# Patient Record
Sex: Female | Born: 1950 | Race: Black or African American | Hispanic: No | Marital: Married | Smoking: Former smoker
Health system: Southern US, Community
[De-identification: ages and names within clinical notes are randomized; demographics above are authoritative.]

## PROBLEM LIST (undated history)

## (undated) DIAGNOSIS — I10 Essential (primary) hypertension: Secondary | ICD-10-CM

## (undated) DIAGNOSIS — Z9889 Other specified postprocedural states: Secondary | ICD-10-CM

## (undated) DIAGNOSIS — R112 Nausea with vomiting, unspecified: Secondary | ICD-10-CM

## (undated) DIAGNOSIS — J8410 Pulmonary fibrosis, unspecified: Secondary | ICD-10-CM

## (undated) DIAGNOSIS — G43909 Migraine, unspecified, not intractable, without status migrainosus: Secondary | ICD-10-CM

## (undated) DIAGNOSIS — E119 Type 2 diabetes mellitus without complications: Secondary | ICD-10-CM

## (undated) DIAGNOSIS — J309 Allergic rhinitis, unspecified: Secondary | ICD-10-CM

## (undated) HISTORY — DX: Essential (primary) hypertension: I10

## (undated) HISTORY — DX: Type 2 diabetes mellitus without complications: E11.9

## (undated) HISTORY — DX: Migraine, unspecified, not intractable, without status migrainosus: G43.909

## (undated) HISTORY — DX: Allergic rhinitis, unspecified: J30.9

## (undated) HISTORY — PX: SALPINGOOPHORECTOMY: SHX82

## (undated) HISTORY — PX: ABDOMINAL HYSTERECTOMY: SHX81

## (undated) HISTORY — PX: BREAST SURGERY: SHX581

---

## 1987-01-28 HISTORY — PX: ABDOMINAL HYSTERECTOMY: SHX81

## 1998-08-30 ENCOUNTER — Other Ambulatory Visit: Admission: RE | Admit: 1998-08-30 | Discharge: 1998-08-30 | Payer: Self-pay | Admitting: *Deleted

## 1999-01-17 ENCOUNTER — Ambulatory Visit (HOSPITAL_BASED_OUTPATIENT_CLINIC_OR_DEPARTMENT_OTHER): Admission: RE | Admit: 1999-01-17 | Discharge: 1999-01-17 | Payer: Self-pay | Admitting: Surgery

## 1999-02-04 ENCOUNTER — Encounter: Payer: Self-pay | Admitting: Gastroenterology

## 1999-02-04 ENCOUNTER — Encounter: Admission: RE | Admit: 1999-02-04 | Discharge: 1999-02-04 | Payer: Self-pay | Admitting: Gastroenterology

## 1999-09-03 ENCOUNTER — Other Ambulatory Visit: Admission: RE | Admit: 1999-09-03 | Discharge: 1999-09-03 | Payer: Self-pay | Admitting: *Deleted

## 2000-10-15 ENCOUNTER — Other Ambulatory Visit: Admission: RE | Admit: 2000-10-15 | Discharge: 2000-10-15 | Payer: Self-pay | Admitting: *Deleted

## 2001-11-05 ENCOUNTER — Ambulatory Visit (HOSPITAL_COMMUNITY): Admission: RE | Admit: 2001-11-05 | Discharge: 2001-11-05 | Payer: Self-pay | Admitting: Gastroenterology

## 2001-11-16 ENCOUNTER — Other Ambulatory Visit: Admission: RE | Admit: 2001-11-16 | Discharge: 2001-11-16 | Payer: Self-pay | Admitting: *Deleted

## 2002-04-25 ENCOUNTER — Encounter: Payer: Self-pay | Admitting: Gastroenterology

## 2002-04-25 ENCOUNTER — Encounter: Admission: RE | Admit: 2002-04-25 | Discharge: 2002-04-25 | Payer: Self-pay | Admitting: Gastroenterology

## 2002-07-30 ENCOUNTER — Emergency Department (HOSPITAL_COMMUNITY): Admission: EM | Admit: 2002-07-30 | Discharge: 2002-07-30 | Payer: Self-pay | Admitting: Emergency Medicine

## 2002-11-29 ENCOUNTER — Other Ambulatory Visit: Admission: RE | Admit: 2002-11-29 | Discharge: 2002-11-29 | Payer: Self-pay | Admitting: *Deleted

## 2003-10-11 ENCOUNTER — Encounter: Admission: RE | Admit: 2003-10-11 | Discharge: 2003-10-11 | Payer: Self-pay | Admitting: Gastroenterology

## 2003-12-04 ENCOUNTER — Other Ambulatory Visit: Admission: RE | Admit: 2003-12-04 | Discharge: 2003-12-04 | Payer: Self-pay | Admitting: *Deleted

## 2004-12-09 ENCOUNTER — Other Ambulatory Visit: Admission: RE | Admit: 2004-12-09 | Discharge: 2004-12-09 | Payer: Self-pay | Admitting: *Deleted

## 2006-02-02 ENCOUNTER — Other Ambulatory Visit: Admission: RE | Admit: 2006-02-02 | Discharge: 2006-02-02 | Payer: Self-pay | Admitting: *Deleted

## 2007-04-06 ENCOUNTER — Other Ambulatory Visit: Admission: RE | Admit: 2007-04-06 | Discharge: 2007-04-06 | Payer: Self-pay | Admitting: *Deleted

## 2008-03-27 ENCOUNTER — Encounter (INDEPENDENT_AMBULATORY_CARE_PROVIDER_SITE_OTHER): Payer: Self-pay | Admitting: *Deleted

## 2008-03-27 LAB — CONVERTED CEMR LAB
RBC count: 3.29 10*6/uL
TSH: 1.95 microintl units/mL
WBC, blood: 8 10*3/uL

## 2008-07-05 ENCOUNTER — Encounter: Admission: RE | Admit: 2008-07-05 | Discharge: 2008-07-05 | Payer: Self-pay | Admitting: Gastroenterology

## 2008-07-05 IMAGING — CT CT CHEST W/O CM
3 of 4 series · 16 of 30 positions shown, 18 images · non-contrast
Comparison: None

CLINICAL DATA: Bronchitis.  Cough.

CT CHEST WITHOUT CONTRAST
TECHNIQUE: Multidetector CT imaging of the chest was performed
following the standard protocol without IV contrast.

[Series 3: routine chest · axial · 0.70mm/px · z∈[-258,-38]mm · 5 of 66 slices shown, 7 images]
[im 11/66  mediastinal]
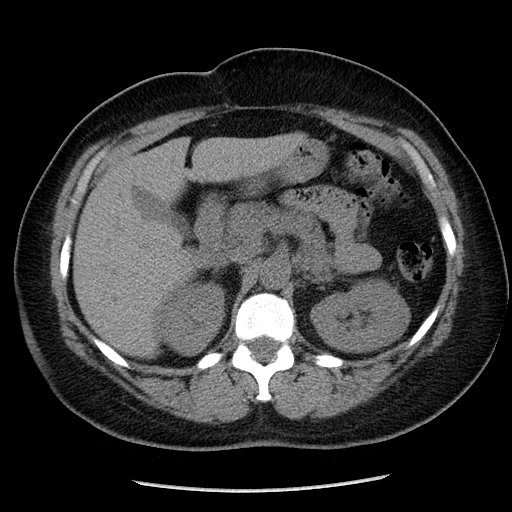
[im 11/66  lung]
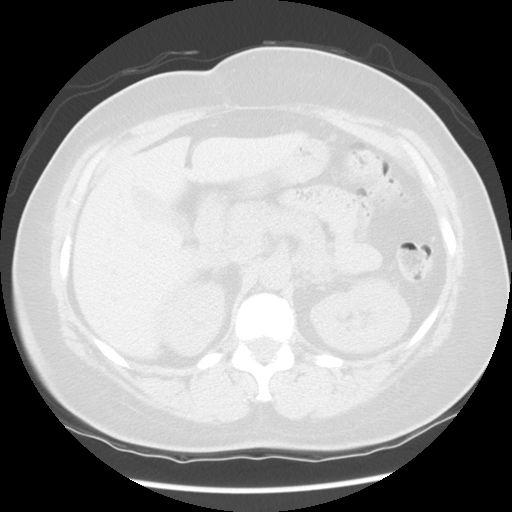
[im 22/66  lung]
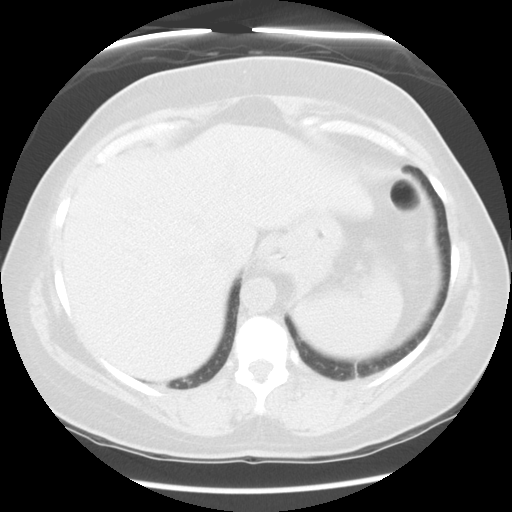
[im 33/66  lung]
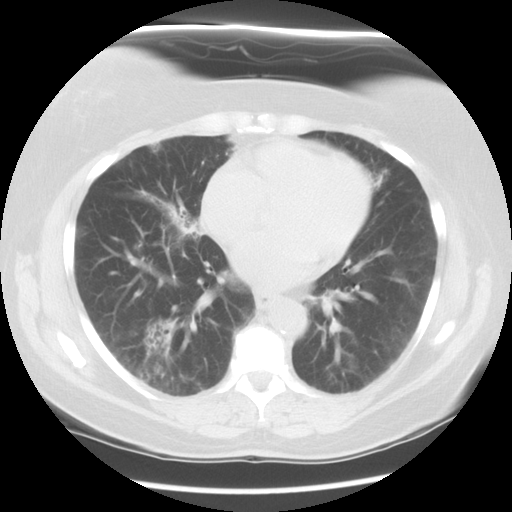
[im 44/66  lung]
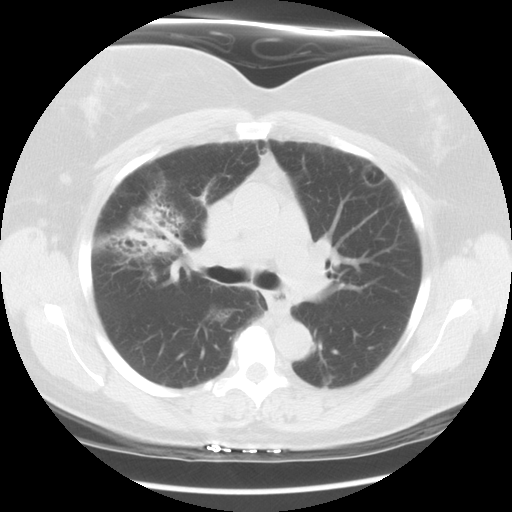
[im 55/66  mediastinal]
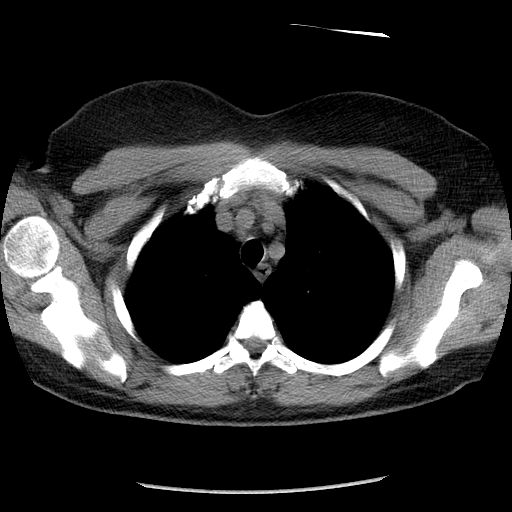
[im 55/66  lung]
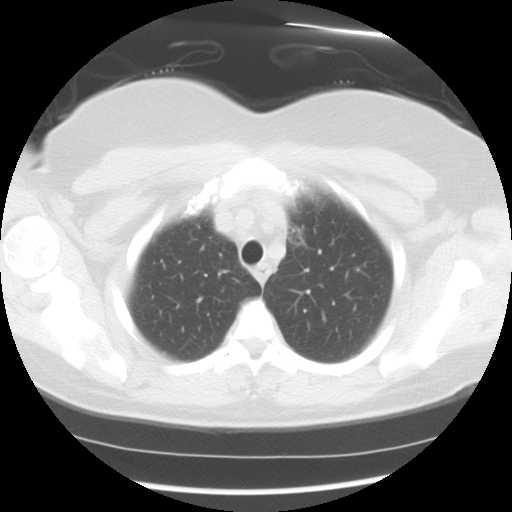

[Series 4: lung windows · axial · 0.70mm/px · z∈[-212,-42]mm · 4 of 58 slices shown]
[im 12/58  lung]
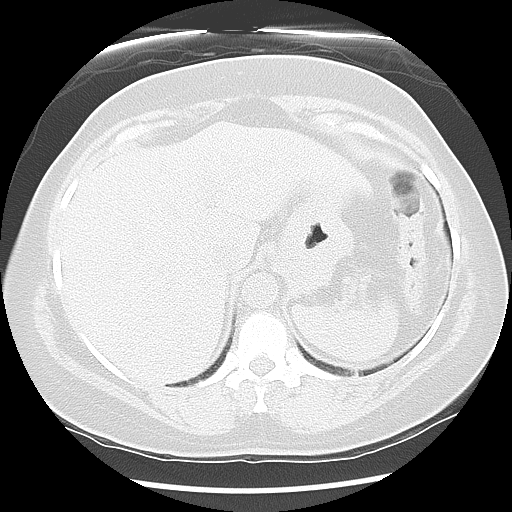
[im 23/58  lung]
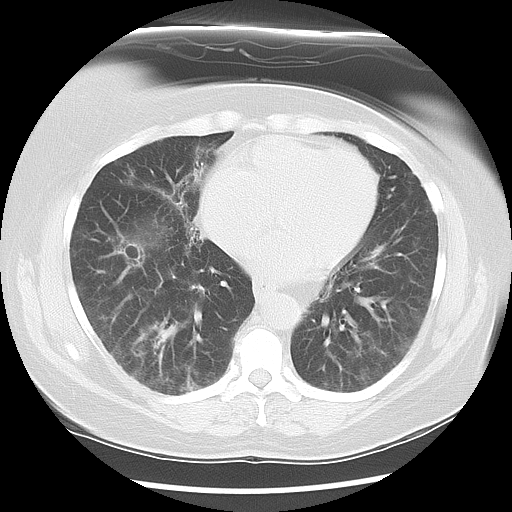
[im 35/58  lung]
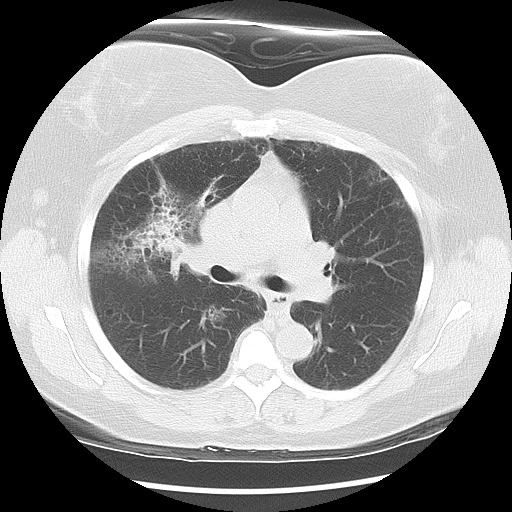
[im 46/58  lung]
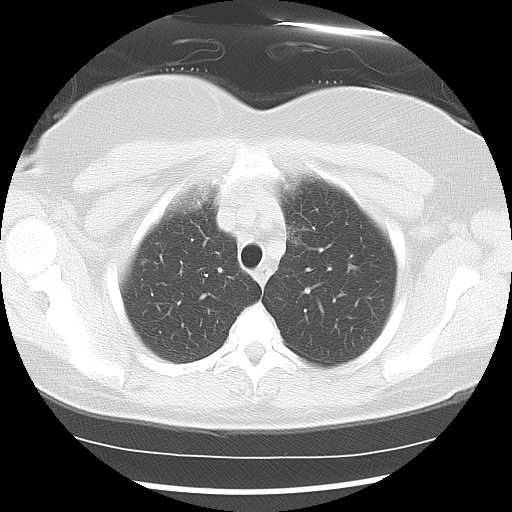

[Series 602: sagittal body · sagittal · 0.70mm/px · 7 of 145 slices shown]
[im 11/145  mediastinal]
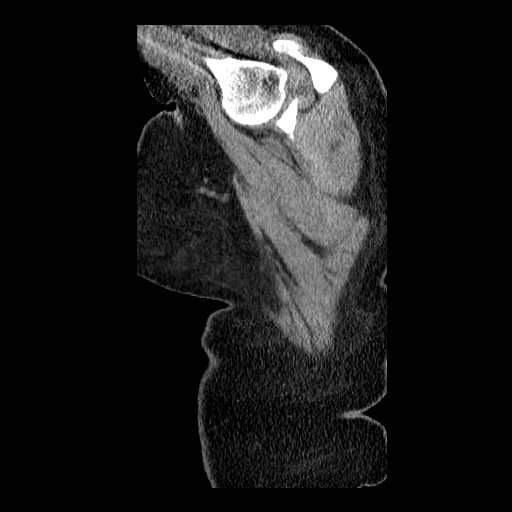
[im 31/145  mediastinal]
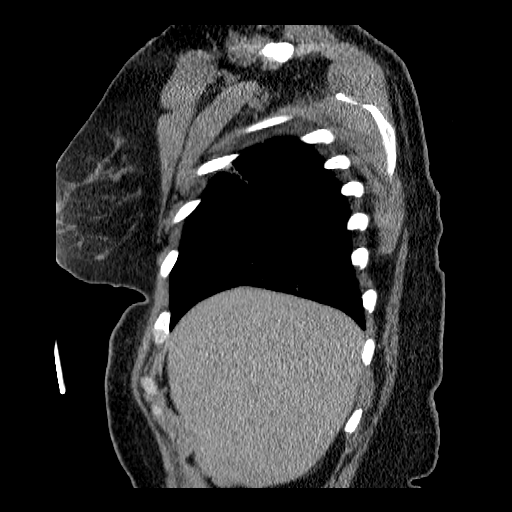
[im 52/145  mediastinal]
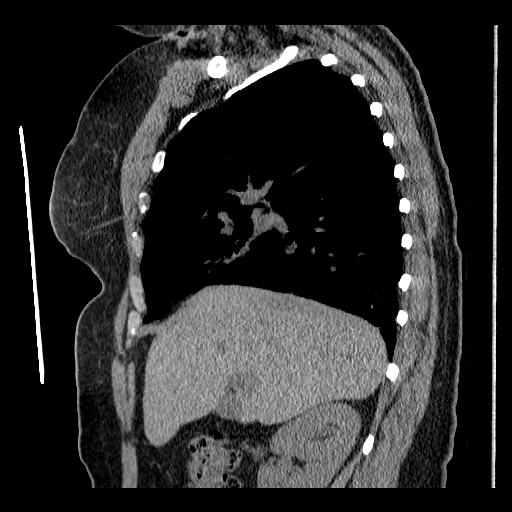
[im 62/145  mediastinal]
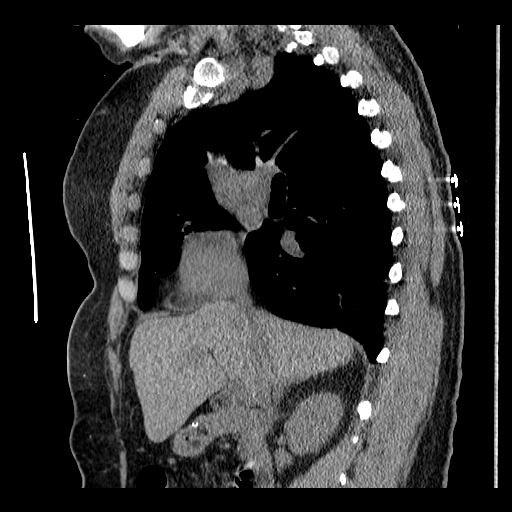
[im 83/145  mediastinal]
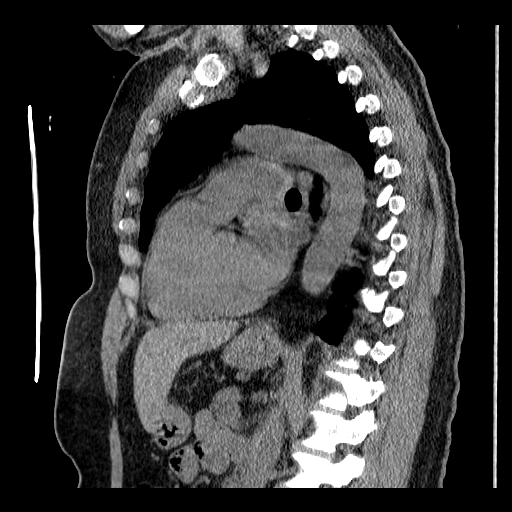
[im 93/145  mediastinal]
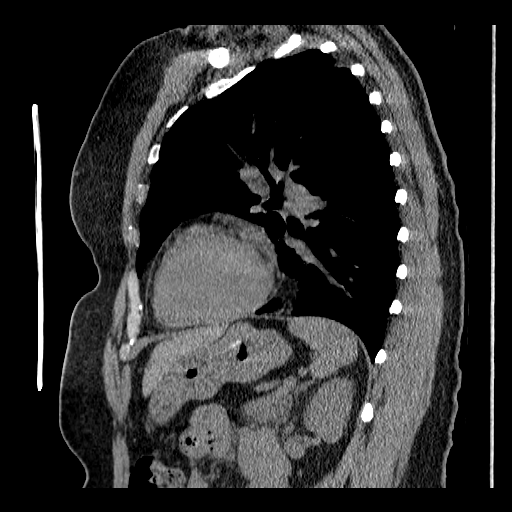
[im 114/145  mediastinal]
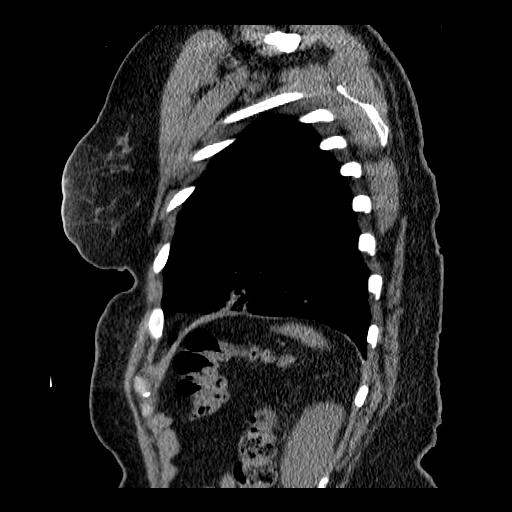

[16 of 30 positions shown; findings below may reference images not displayed]

FINDINGS: IV contrast could not be administered due to lack of IV
access.

There are scattered regions of varicoid bronchiectasis extending
into cystic regions predominately in the peripheral lungs with
considerable surrounding density.  A pretracheal node is enlarged
at 1.28 x 1.3 cm, but we do not demonstrate the degree of
adenopathy typically encountered in sarcoidosis.  There is also
evidence of some chronically calcified mediastinal and left hilar
nodes from remote granulomatous disease.

Although given the airway irregularity sarcoidosis is not entirely
excluded, other possibilities include Langerhans cell histiocytosis
and unusually distributed UIP with honeycombing.  Chronic recurrent
pneumonia could conceivably appear this way.  Chronic stage
hypersensitivity pneumonitis possible but considered less likely,
and lymphangiomyomatosis is also considered unlikely given the
density surrounding the cystic lesions. Given the degree of
underlying densities, follow-up chest CT may be indicated to assess
for progression and exclude the unlikely possibility of underlying
malignancy such as lymphangitic tumor.

A hypodense lesion in the lateral segment left hepatic lobe
measuring 1 cm on image 49 of series #3 is nonspecific but likely a
cyst.
IMPRESSION: 1.  Scattered regions of volume loss with associated varicoid
bronchiectasis and cystic regions in the lungs.  The appearance
suggests honeycombing in the setting of regions of chronic
inflammation.  Other differential diagnostic considerations include
Langerhans cell histiocytosis, UIP, sarcoidosis, and chronic stage
hypersensitivity pneumonitis.
2.  Hypodense lesion in the lateral segment left hepatic lobe is
nonspecific but likely a cyst.

## 2008-08-01 ENCOUNTER — Encounter: Payer: Self-pay | Admitting: *Deleted

## 2008-08-01 DIAGNOSIS — K219 Gastro-esophageal reflux disease without esophagitis: Secondary | ICD-10-CM | POA: Insufficient documentation

## 2008-08-01 DIAGNOSIS — Z8719 Personal history of other diseases of the digestive system: Secondary | ICD-10-CM | POA: Insufficient documentation

## 2008-08-01 DIAGNOSIS — Z862 Personal history of diseases of the blood and blood-forming organs and certain disorders involving the immune mechanism: Secondary | ICD-10-CM | POA: Insufficient documentation

## 2008-08-01 DIAGNOSIS — R109 Unspecified abdominal pain: Secondary | ICD-10-CM | POA: Insufficient documentation

## 2008-08-01 DIAGNOSIS — J329 Chronic sinusitis, unspecified: Secondary | ICD-10-CM | POA: Insufficient documentation

## 2008-08-01 DIAGNOSIS — E162 Hypoglycemia, unspecified: Secondary | ICD-10-CM | POA: Insufficient documentation

## 2008-08-01 DIAGNOSIS — G43909 Migraine, unspecified, not intractable, without status migrainosus: Secondary | ICD-10-CM | POA: Insufficient documentation

## 2008-08-01 DIAGNOSIS — Z8679 Personal history of other diseases of the circulatory system: Secondary | ICD-10-CM | POA: Insufficient documentation

## 2008-08-02 ENCOUNTER — Ambulatory Visit: Payer: Self-pay | Admitting: Emergency Medicine

## 2008-08-02 DIAGNOSIS — R930 Abnormal findings on diagnostic imaging of skull and head, not elsewhere classified: Secondary | ICD-10-CM | POA: Insufficient documentation

## 2008-08-08 ENCOUNTER — Ambulatory Visit: Payer: Self-pay | Admitting: Emergency Medicine

## 2008-08-08 ENCOUNTER — Ambulatory Visit: Admission: RE | Admit: 2008-08-08 | Discharge: 2008-08-08 | Payer: Self-pay | Admitting: Emergency Medicine

## 2008-08-08 ENCOUNTER — Encounter: Payer: Self-pay | Admitting: Emergency Medicine

## 2008-08-17 ENCOUNTER — Ambulatory Visit: Payer: Self-pay | Admitting: Emergency Medicine

## 2008-08-18 LAB — CONVERTED CEMR LAB
A-1 Antitrypsin, Ser: 193 mg/dL (ref 83–200)
IgA: 339 mg/dL (ref 68–378)
IgE (Immunoglobulin E), Serum: 11.1 intl units/mL (ref 0.0–180.0)
IgG (Immunoglobin G), Serum: 2639 mg/dL — ABNORMAL HIGH (ref 694–1618)
IgM, Serum: 530 mg/dL — ABNORMAL HIGH (ref 60–263)

## 2008-10-17 ENCOUNTER — Ambulatory Visit: Payer: Self-pay | Admitting: Emergency Medicine

## 2008-12-15 ENCOUNTER — Emergency Department (HOSPITAL_COMMUNITY): Admission: EM | Admit: 2008-12-15 | Discharge: 2008-12-15 | Payer: Self-pay | Admitting: Emergency Medicine

## 2009-01-15 ENCOUNTER — Ambulatory Visit: Payer: Self-pay | Admitting: Internal Medicine

## 2009-04-09 ENCOUNTER — Telehealth (INDEPENDENT_AMBULATORY_CARE_PROVIDER_SITE_OTHER): Payer: Self-pay | Admitting: *Deleted

## 2009-06-20 ENCOUNTER — Ambulatory Visit: Payer: Self-pay | Admitting: Emergency Medicine

## 2009-06-20 DIAGNOSIS — J479 Bronchiectasis, uncomplicated: Secondary | ICD-10-CM | POA: Insufficient documentation

## 2010-01-29 ENCOUNTER — Ambulatory Visit: Payer: Self-pay | Admitting: Oncology

## 2010-02-28 NOTE — Assessment & Plan Note (Signed)
Summary: bronchiectasis   Visit Type:  Follow-up Copy to:  Dr. Hal Hope Primary Provider/Referring Provider:  Dr.  Tasia Catchings  CC:  Followup.  Pt states that her cough has been doing much better than before.  She states that she still has some occ prod cough with clear sputum.  She relates cough to allergies and PND.  She states that xyzal helps..  History of Present Illness: 60 yo woman, former smoker, with hx von Willibrand's dz, diverticular disease, GERD. Presents today for evaluation of cough and an abnormal CT scan of the chest. Was treated for bronchitis, possible R CAP in 05/2008 by Dr Sherin Quarry. Had some purulent sputum and some scant hemoptysis at that time. Treated with abx and her purulent blood-tinged sputum resolved. Still has some dry cough, hoarse voice. Cough bothered by singing, breathing deeply. In retrospect has had some cough and hoarse voice for many years - she states that many of the chronic symptoms began when she moved to a new home. Allergy testing shows she is sensitive to dusts, mites etc. Wakes up every morning with congestion. Has significant PND.   CT scan (07/05/08) shows areas of focal bronchiectasis with associated parenchymal disease and thin-walled cyst formation.   ROV 08/17/08 -- she is s/p FOB to evaluate bronchiectasis. AFB and fungal smears are negative, cytology negative. Still has PND, hoarse voice, cough.   ROV 10/17/08 -- Still with nasal gtt, clearing her throat, hoarse voice. No changes in her breathing. All cx's from FOB are now final and negative.   ROV 06/20/09 -- hx bronchiectasis and chronic infiltrates. s/p CT chest in 12/10 as below.  Tells me that she has been doing well, still with cough and clear drainage. Some cough especially when the pollen is high. Has been on xyzal, just restarted it. Has helped her cough and mucous. Singing going well, hoarse voice better.   Current Medications (verified): 1)  Triamterene-Hctz 37.5-25 Mg Tabs  (Triamterene-Hctz) .... Take 1/2 Tablet By Mouth Once A Day 2)  Vitamin D (Ergocalciferol) 50000 Unit Caps (Ergocalciferol) .Marland Kitchen.. 1 Every Wk 3)  Levocetirizine Dihydrochloride 5 Mg Tabs (Levocetirizine Dihydrochloride) .Marland Kitchen.. 1 Once Daily As Needed 4)  Losartan Potassium 100 Mg Tabs (Losartan Potassium) .Marland Kitchen.. 1 Once Daily  Allergies: 1)  ! Zestril 2)  ! * Emycin 3)  ! Sulfa 4)  ! Codeine 5)  ! Asa  Vital Signs:  Patient profile:   60 year old female Weight:      206 pounds O2 Sat:      97 % on Room air Temp:     98.2 degrees F oral Pulse rate:   77 / minute BP sitting:   104 / 64  (left arm) Cuff size:   large  Vitals Entered By: Vernie Murders (Jun 20, 2009 8:58 AM)  O2 Flow:  Room air  Physical Exam  General:  normal appearance, healthy appearing, and obese.   Head:  normocephalic and atraumatic Eyes:  conjunctiva and sclera clear Nose:  no deformity, discharge, inflammation, or lesions Mouth:  no deformity or lesions; hoarse voice Neck:  no masses, thyromegaly, or abnormal cervical nodes Lungs:  clear bilaterally to auscultation Heart:  regular rate and rhythm, S1, S2 without murmurs, rubs, gallops, or clicks Abdomen:  not examined Msk:  no deformity or scoliosis noted with normal posture Extremities:  no clubbing, cyanosis, edema, or deformity noted Neurologic:  non-focal Skin:  intact without lesions or rashes Psych:  alert and cooperative; normal mood and  affect; normal attention span and concentration   Impression & Recommendations:  Problem # 1:  BRONCHIECTASIS (ICD-494.0)  Problem # 2:  SINUSITIS, CHRONIC (ICD-473.9)  Medications Added to Medication List This Visit: 1)  Vitamin D (ergocalciferol) 50000 Unit Caps (Ergocalciferol) .Marland Kitchen.. 1 every wk 2)  Levocetirizine Dihydrochloride 5 Mg Tabs (Levocetirizine dihydrochloride) .Marland Kitchen.. 1 once daily as needed 3)  Losartan Potassium 100 Mg Tabs (Losartan potassium) .Marland Kitchen.. 1 once daily  Other Orders: Est. Patient Level IV  (29528)  Patient Instructions: 1)  Please continue your xyzal, try taking it every day during allergy season.  2)  Continue nasal saline washes as needed  3)  We will repeat your CXR in 6 months.  4)  We will hold off on a repeat CT scan for now unless you develop new symptoms or your CXR changes.  5)  Follow up with DR Chris Narasimhan in 6 months to review your symptoms and your CXR.

## 2010-02-28 NOTE — Progress Notes (Signed)
Summary: results  Phone Note Call from Patient   Caller: Patient Call For: [facility] Summary of Call: pt have not received results on ct chest scan Initial call taken by: Rickard Patience,  April 09, 2009 10:57 AM  Follow-up for Phone Call        Pt is requesting ct chest results.  Append states will discuss at appt, but pt never sched appt and your next available is 3/23.  Please advise thanks Vernie Murders  April 09, 2009 11:04 AM  Discussed results with her today. Will need to set up ROV (non urgent). Please call her next week to set up an OV Leslye Peer MD  April 13, 2009 4:42 PM   Follow-up by: Leslye Peer MD,  April 13, 2009 4:42 PM  Additional Follow-up for Phone Call Additional follow up Details #1::        The patient is scheduled for OV with RB on 05/07/2009 @ 4:30. Patient is aware. Additional Follow-up by: Michel Bickers CMA,  April 16, 2009 8:46 AM

## 2010-05-05 LAB — CULTURE, RESPIRATORY W GRAM STAIN

## 2010-05-05 LAB — AFB CULTURE WITH SMEAR (NOT AT ARMC): Acid Fast Smear: NONE SEEN

## 2010-05-05 LAB — FUNGUS CULTURE W SMEAR: Fungal Smear: NONE SEEN

## 2010-06-11 NOTE — Op Note (Signed)
NAMESHANAYE, Brandy Medina                 ACCOUNT NO.:  [id number]   MEDICAL RECORD NO.:  [id number]          PATIENT TYPE:  AMB   LOCATION:  CARD                         FACILITY:  [facility]   PHYSICIAN:  Leslye Peer, MD    DATE OF BIRTH:  1950-07-06   DATE OF PROCEDURE:  08/08/2008  DATE OF DISCHARGE:                               OPERATIVE REPORT   PROCEDURE PERFORMED:  Fiberoptic bronchoscopy with bronchoalveolar  lavage.   SURGEON:  Leslye Peer, MD   INDICATIONS FOR PROCEDURE:  Abnormal CT scan of the chest and scant  hemoptysis.   CONSENT:  Informed consent was obtained from the patient, and a signed  copy was on her hospital chart.   MEDICATIONS GIVEN:  1. Intranasal DDAVP 250 mcg total was given 30 minutes prior to the      procedure, given the patient's history of von Willebrand disease.  2. Fentanyl 100 mcg IV in divided doses.  3. Versed 4 mg IV in divided doses.  4. Lidocaine 1% to the bronchoalveolar tree for a total of 30 mL used.   PROCEDURE IN DETAIL:  After informed consent was obtained, a formal time-  out was performed, and then conscious sedation was initiated as outlined  above.  As mentioned, the patient had received DDAVP 250 mcg 30 minutes  prior to initiation of conscious sedation.  The bronchoscope was  introduced through the oropharynx to avoid bleeding, and the posterior  pharynx was anesthetized with Cetacaine spray and lidocaine 1%. The  trachea was intubated without difficulty.  The airways were normal in  appearance, with a sharp main carina.  The left mainstem bronchus was  widely patent and appeared normal.  The left upper lobe lingular airways  were likewise normal.  The left lower lobe airways were inspected and  were also normal.  There were no endobronchial lesions or abnormal  secretions throughout the entire left-side exam.  Attention was then  turned to the right side, where the right mainstem bronchus and bronchus  intermedius were  normal.  The right upper lobe, right middle lobe, and  right lower lobe airways were all individually inspected, and again  there were no endobronchial lesions or abnormal secretions seen.  There  was no bleeding source noted and no bleeding in any of the airways, with  the exception of some minor scope trauma that occurred with coughing in  the left upper lobe.  There was no significant blood loss.  A  bronchoalveolar lavage was performed in the right upper lobe, with 60 mL  of normal saline instilled and approximately 20 mL returned.  This will  be sent for microbiology and cytology.  The patient tolerated the  procedure well.  She was taken to the recovery room in good condition.   SAMPLES:  1. Right upper lobe bronchoalveolar lavage to be sent for microbiology      and cytology.   PLAN:  I will follow-up the microbiology results and cytology results  with Ms. Tourangeau, when they become available, as an outpatient.      Les Pou.  Delton Coombes, MD  Electronically Signed     RSB/MEDQ  D:  08/08/2008  T:  08/08/2008  Job:  [id number]

## 2011-08-26 ENCOUNTER — Other Ambulatory Visit: Payer: Self-pay | Admitting: Gastroenterology

## 2014-07-04 ENCOUNTER — Other Ambulatory Visit: Payer: Self-pay | Admitting: Internal Medicine

## 2014-07-04 ENCOUNTER — Ambulatory Visit
Admission: RE | Admit: 2014-07-04 | Discharge: 2014-07-04 | Disposition: A | Discharge status: Home / Self Care | Payer: Self-pay | Source: Ambulatory Visit | Attending: Internal Medicine | Admitting: Internal Medicine

## 2014-07-04 DIAGNOSIS — R059 Cough, unspecified: Secondary | ICD-10-CM

## 2014-07-04 DIAGNOSIS — R0981 Nasal congestion: Secondary | ICD-10-CM

## 2014-07-04 DIAGNOSIS — R05 Cough: Secondary | ICD-10-CM

## 2014-07-06 ENCOUNTER — Encounter (INDEPENDENT_AMBULATORY_CARE_PROVIDER_SITE_OTHER): Payer: Self-pay

## 2014-07-06 ENCOUNTER — Encounter: Payer: Self-pay | Admitting: Internal Medicine

## 2014-07-06 ENCOUNTER — Other Ambulatory Visit (INDEPENDENT_AMBULATORY_CARE_PROVIDER_SITE_OTHER): Payer: BC Managed Care – PPO

## 2014-07-06 ENCOUNTER — Ambulatory Visit (INDEPENDENT_AMBULATORY_CARE_PROVIDER_SITE_OTHER): Payer: BC Managed Care – PPO | Admitting: Internal Medicine

## 2014-07-06 VITALS — BP 126/80 | HR 106 | Ht 64.0 in | Wt 186.2 lb

## 2014-07-06 DIAGNOSIS — J471 Bronchiectasis with (acute) exacerbation: Secondary | ICD-10-CM

## 2014-07-06 DIAGNOSIS — R058 Other specified cough: Secondary | ICD-10-CM

## 2014-07-06 DIAGNOSIS — I10 Essential (primary) hypertension: Secondary | ICD-10-CM

## 2014-07-06 DIAGNOSIS — R05 Cough: Secondary | ICD-10-CM | POA: Diagnosis not present

## 2014-07-06 LAB — CBC WITH DIFFERENTIAL/PLATELET
Basophils Absolute: 0 10*3/uL (ref 0.0–0.1)
Basophils Relative: 0.5 % (ref 0.0–3.0)
Eosinophils Absolute: 0 10*3/uL (ref 0.0–0.7)
Eosinophils Relative: 0.7 % (ref 0.0–5.0)
HCT: 28 % — ABNORMAL LOW (ref 36.0–46.0)
Hemoglobin: 9.4 g/dL — ABNORMAL LOW (ref 12.0–15.0)
Lymphocytes Relative: 29.9 % (ref 12.0–46.0)
Lymphs Abs: 1.3 10*3/uL (ref 0.7–4.0)
MCHC: 33.6 g/dL (ref 30.0–36.0)
MCV: 90.1 fl (ref 78.0–100.0)
Monocytes Absolute: 0.3 10*3/uL (ref 0.1–1.0)
Monocytes Relative: 7 % (ref 3.0–12.0)
Neutro Abs: 2.7 10*3/uL (ref 1.4–7.7)
Neutrophils Relative %: 61.9 % (ref 43.0–77.0)
Platelets: 375 10*3/uL (ref 150.0–400.0)
RBC: 3.11 Mil/uL — ABNORMAL LOW (ref 3.87–5.11)
RDW: 14.8 % (ref 11.5–15.5)
WBC: 4.4 10*3/uL (ref 4.0–10.5)

## 2014-07-06 LAB — SEDIMENTATION RATE: Sed Rate: 117 mm/hr — ABNORMAL HIGH (ref 0–22)

## 2014-07-06 MED ORDER — VALSARTAN 160 MG PO TABS: 160.0000 mg | ORAL_TABLET | Freq: Every day | ORAL | Status: DC

## 2014-07-06 MED ORDER — PREDNISONE 10 MG PO TABS: ORAL_TABLET | ORAL | Status: DC

## 2014-07-06 MED ORDER — FAMOTIDINE 20 MG PO TABS: ORAL_TABLET | ORAL | Status: DC

## 2014-07-06 MED ORDER — PANTOPRAZOLE SODIUM 40 MG PO TBEC: 40.0000 mg | DELAYED_RELEASE_TABLET | Freq: Every day | ORAL | Status: DC

## 2014-07-06 NOTE — Progress Notes (Signed)
Subjective:    Patient ID: Brandy Medina, female    DOB: 08-29-1950,    MRN: [id number]  HPI  24 yobf quit smoking 1995 eval by [facility] for Bronchiectasis and sinusitis in 2010 but no regular f/u by pulmonary/ ent with flares several per times a year  and always with pnds and referred by Dr Lysle Rubens 07/06/2014 to [facility] for persistent daily cough since March 2016   07/06/2014 1st [facility] office visit/ Haizel Gatchell   Chief Complaint  Patient presents with  . Pulmonary Consult    Referred by Dr Deforest Hoyles. C/O productive cough with worsening SOB since March 2016. Pt states that mucus is clear in color. Denies fevers or wheezing. Currently on antibiotic d/t PNA on recent CXR   onset of cough  was insidious/ pattern perstitent daily mucus variably tan, sticky never bloody and thicker in am or if turns over in sleep wakes up cough Also sob walking with groceries or flight of steps / advair made it worse /no better with abx and now also sob with activity/ ok breathing at rest as long as not coughing. Presently on levaquin and mucus now clear   No obvious other  day to day or daytime variabilty or assoc chronic cough or cp or chest tightness, subjective wheeze overt sinus or hb symptoms. No unusual exp hx or h/o childhood pna/ asthma or knowledge of premature birth.   Also denies any obvious fluctuation of symptoms with weather or environmental changes or other aggravating or alleviating factors except as outlined above   Current Medications, Allergies, Complete Past Medical History, Past Surgical History, Family History, and Social History were reviewed in [employer] record.            Review of Systems  Constitutional: Positive for chills. Negative for fever and unexpected weight change.  HENT: Negative for congestion, dental problem, ear pain, nosebleeds, postnasal drip, rhinorrhea, sinus pressure, sneezing, sore throat, trouble swallowing and voice change.     Eyes: Negative for visual disturbance.  Respiratory: Positive for cough, choking and shortness of breath.   Cardiovascular: Positive for chest pain. Negative for leg swelling.  Gastrointestinal: Negative for vomiting, abdominal pain and diarrhea.  Genitourinary: Negative for difficulty urinating.  Musculoskeletal: Negative for arthralgias.  Skin: Negative for rash.  Neurological: Positive for headaches. Negative for tremors and syncope.  Hematological: Does not bruise/bleed easily.       Objective:   Physical Exam  Wt Readings from Last 3 Encounters:  07/06/14 186 lb 3.2 oz (84.46 kg)  06/20/09 206 lb (93.441 kg)  10/17/08 214 lb 6.1 oz (97.243 kg)    Vital signs reviewed   HEENT: nl dentition, turbinates, and orophanx. Nl external ear canals without cough reflex   NECK :  without JVD/Nodes/TM/ nl carotid upstrokes bilaterally   LUNGS: no acc muscle use, clear to A and P bilaterally without cough on insp or exp maneuvers   CV:  RRR  no s3 or murmur or increase in P2, no edema   ABD:  soft and nontender with nl excursion in the supine position. No bruits or organomegaly, bowel sounds nl  MS:  warm without deformities, calf tenderness, cyanosis or clubbing  SKIN: warm and dry without lesions    NEURO:  alert, approp, no deficits     I personally reviewed images and agree with radiology impression as follows:  CXR:   07/04/14 Findings consistent with acute bilateral interstitial pneumonia superimposed upon chronic interstitial lung disease  Labs ordered/ reviewed:   Lab 07/06/14 1614  HGB 9.4*  HCT 28.0*  WBC 4.4  PLT 375.0      Lab Results  Component Value Date   ESRSEDRATE 117* 07/06/2014         Assessment & Plan:

## 2014-07-06 NOTE — Patient Instructions (Addendum)
Finish levaquin as you plan   Stop cozar 100  And start diovan 160 mg one daily   Please remember to go to the lab   department downstairs for your tests - we will call you with the results when they are available.  Prednisone 10 mg take  4 each am x 2 days,   2 each am x 2 days,  1 each am x 2 days and stop  For drainage take chlortrimeton (chlorpheniramine) 4 mg every 4 hours available over the counter (may cause drowsiness)   Pantoprazole (protonix) 40 mg   Take  30-60 min before first meal of the day and Pepcid (famotidine)  20 mg one @  bedtime until return to office - this is the best way to tell whether stomach acid is contributing to your problem.    GERD (REFLUX)  is an extremely common cause of respiratory symptoms just like yours , many times with no obvious heartburn at all.    It can be treated with medication, but also with lifestyle changes including avoidance of late meals, elevation of the head of your bed (ideally with 6 inch  bed blocks) excessive alcohol, smoking cessation, and avoid fatty foods, chocolate, peppermint, colas, red wine, and acidic juices such as orange juice.  NO MINT OR MENTHOL PRODUCTS SO NO COUGH DROPS  USE SUGARLESS CANDY INSTEAD (Jolley ranchers or Stover's or Life Savers) or even ice chips will also do - the key is to swallow to prevent all throat clearing. NO OIL BASED VITAMINS - use powdered substitutes.  Try over the counter delsym 2 tsp every 12 hours as needed for cough   Please schedule a follow up office visit in 4 weeks, sooner if needed with cxr on return to f/u bronchiectasis

## 2014-07-07 ENCOUNTER — Encounter: Payer: Self-pay | Admitting: Internal Medicine

## 2014-07-07 DIAGNOSIS — I10 Essential (primary) hypertension: Secondary | ICD-10-CM | POA: Insufficient documentation

## 2014-07-07 LAB — ALLERGY FULL PROFILE
Allergen, D pternoyssinus,d7: 0.18 kU/L — ABNORMAL HIGH
Allergen,Goose feathers, e70: <0.1 kU/L
Alternaria Alternata: <0.1 kU/L
Aspergillus fumigatus, m3: <0.1 kU/L
Bahia Grass: <0.1 kU/L
Bermuda Grass: <0.1 kU/L
Box Elder IgE: <0.1 kU/L
Candida Albicans: <0.1 kU/L
Cat Dander: <0.1 kU/L
Common Ragweed: <0.1 kU/L
Curvularia lunata: <0.1 kU/L
D. farinae: 0.14 kU/L — ABNORMAL HIGH
Dog Dander: <0.1 kU/L
Elm IgE: <0.1 kU/L
Fescue: <0.1 kU/L
G005 Rye, Perennial: <0.1 kU/L
G009 Red Top: <0.1 kU/L
Goldenrod: <0.1 kU/L
Helminthosporium halodes: <0.1 kU/L
House Dust Hollister: <0.1 kU/L
IgE (Immunoglobulin E), Serum: 19 kU/L (ref <115)
Lamb's Quarters: <0.1 kU/L
Oak: <0.1 kU/L
Plantain: <0.1 kU/L
Stemphylium Botryosum: <0.1 kU/L
Sycamore Tree: <0.1 kU/L
Timothy Grass: <0.1 kU/L

## 2014-07-07 NOTE — Assessment & Plan Note (Signed)
The most common causes of chronic cough in immunocompetent adults include the following: upper airway cough syndrome (UACS), previously referred to as postnasal drip syndrome (PNDS), which is caused by variety of rhinosinus conditions; (2) asthma; (3) GERD; (4) chronic bronchitis from cigarette smoking or other inhaled environmental irritants; (5) nonasthmatic eosinophilic bronchitis; and (6) bronchiectasis.   These conditions, singly or in combination, have accounted for up to 94% of the causes of chronic cough in prospective studies.   Other conditions have constituted no >6% of the causes in prospective studies These have included bronchogenic carcinoma, chronic interstitial pneumonia, sarcoidosis, left ventricular failure, ACEI-induced cough, and aspiration from a condition associated with pharyngeal dysfunction.    Chronic cough is often simultaneously caused by more than one condition. A single cause has been found from 38 to 82% of the time, multiple causes from 18 to 62%. Multiply caused cough has been the result of three diseases up to 42% of the time.   She clearly has bronchiectasis ? Sinusitis by previous eval but chronically problems are more typical of  Classic Upper airway cough syndrome, so named because it's frequently impossible to sort out how much is  CR/sinusitis with freq throat clearing (which can be related to primary GERD)   vs  causing  secondary (" extra esophageal")  GERD from wide swings in gastric pressure that occur with throat clearing, often  promoting self use of mint and menthol lozenges that reduce the lower esophageal sphincter tone and exacerbate the problem further in a cyclical fashion.   These are the same pts (now being labeled as having "irritable larynx syndrome" by some cough centers) who not infrequently have a history of having failed to tolerate ace inhibitors ( and now Cozar reported - see hbp),  dry powder inhalers or biphosphonates or report having  atypical reflux symptoms that don't respond to standard doses of PPI , and are easily confused as having aecopd or asthma flares by even experienced allergists/ pulmonologists.  For now try off cozar/ max rx for GERD and eval sinus dz then regroup

## 2014-07-07 NOTE — Assessment & Plan Note (Signed)
For reasons that may related to vascular permability and nitric oxide pathways but not elevated  bradykinin levels (as seen with  ACEi use) losartan in the generic form has been reported now from mulitple sources  to cause a similar pattern of non-specific  upper airway symptoms as seen with acei.   This has not been reported with exposure to the other ARB's to date, so it seems reasonable for now to try either generic diovan or avapro if ARB needed or use an alternative class altogether.  See:  Lelon Frohlich Allergy Asthma Immunol  2008: 101: p [phone]   Try diovan 160 mg daily instead

## 2014-07-07 NOTE — Progress Notes (Signed)
Quick Note:  Spoke with pt and notified of results per Dr. Wert. Pt verbalized understanding and denied any questions.  ______ 

## 2014-07-07 NOTE — Assessment & Plan Note (Signed)
Already on approp rx/ may need repeat CT if acute changes don't clear back to baseline > eval in 4 weeks

## 2014-07-10 LAB — ALPHA-1-ANTITRYPSIN: A-1 Antitrypsin, Ser: 173 mg/dL (ref 83–199)

## 2014-07-12 LAB — ALPHA-1 ANTITRYPSIN PHENOTYPE: A-1 Antitrypsin: 165 mg/dL (ref 83–199)

## 2014-07-12 NOTE — Progress Notes (Signed)
Quick Note:  Called and spoke to pt. Reviewed results and recs. Pt voiced understanding and had no further questions. ______ 

## 2014-08-04 ENCOUNTER — Ambulatory Visit (INDEPENDENT_AMBULATORY_CARE_PROVIDER_SITE_OTHER): Payer: BC Managed Care – PPO | Admitting: Internal Medicine

## 2014-08-04 ENCOUNTER — Ambulatory Visit (INDEPENDENT_AMBULATORY_CARE_PROVIDER_SITE_OTHER)
Admission: RE | Admit: 2014-08-04 | Discharge: 2014-08-04 | Disposition: A | Discharge status: Home / Self Care | Payer: BC Managed Care – PPO | Source: Ambulatory Visit | Attending: Internal Medicine | Admitting: Internal Medicine

## 2014-08-04 ENCOUNTER — Encounter: Payer: Self-pay | Admitting: Internal Medicine

## 2014-08-04 DIAGNOSIS — J479 Bronchiectasis, uncomplicated: Secondary | ICD-10-CM

## 2014-08-04 MED ORDER — FLUTTER DEVI: Status: DC

## 2014-08-04 NOTE — Progress Notes (Signed)
Subjective:    Patient ID: Brandy Medina, female    DOB: [birth year],    MRN: [id number]    Brief patient profile:  49 yobf quit smoking 1995 eval by Brandy Medina for Bronchiectasis and sinusitis in 2010 but no regular f/u by pulmonary/ ent with flares several per times a year  and always with pnds and referred by Brandy Medina 07/06/2014 to pulmonary clinic for persistent daily cough since March 2016    History of Present Illness  07/06/2014 1st [facility] office visit/ Brandy Medina   Chief Complaint  Patient presents with  . Pulmonary Consult    Referred by Brandy Medina. C/O productive cough with worsening SOB since March 2016. Pt states that mucus is clear in color. Denies fevers or wheezing. Currently on antibiotic d/t PNA on recent CXR   onset of cough  was insidious/ pattern perstitent daily mucus variably tan, sticky never bloody and thicker in am or if turns over in sleep wakes up cough Also sob walking with groceries or flight of steps / advair made it worse /no better with abx and now also sob with activity/ ok breathing at rest as long as not coughing. Presently on levaquin and mucus now clear rec Finish levaquin as you plan  Stop cozar 100  And start diovan 160 mg one daily  Please remember to go to the lab department downstairs for your tests - we will call you with the results when they are available. Prednisone 10 mg take  4 each am x 2 days,   2 each am x 2 days,  1 each am x 2 days and stop For drainage take chlortrimeton (chlorpheniramine) 4 mg every 4 hours available over the counter (may cause drowsiness)  Pantoprazole (protonix) 40 mg   Take  30-60 min before first meal of the day and Pepcid (famotidine)  20 mg one @  bedtime   GERD diet Try over the counter delsym 2 tsp every 12 hours as needed for cough      08/04/2014 f/u ov/Brandy Medina re: bronchiectasis  Chief Complaint  Patient presents with  . Follow-up    Pt states that her cough is much improved.  When she does cough it is prod  with minimal tan sputum. She states she is having constant PND. Her breathing has improved.   Not limited by breathing from desired activities  / sense of  pnds is day > noct with urge to clear throat disprorportionate to mucus production which is minimal.  No obvious day to day or daytime variabilty or assoc excess or cp or chest tightness, subjective wheeze orovert  or hb symptoms. No unusual exp hx or h/o childhood pna/ asthma or knowledge of premature birth.  Sleeping ok without nocturnal  or early am exacerbation  of respiratory  c/o's or need for noct saba. Also denies any obvious fluctuation of symptoms with weather or environmental changes or other aggravating or alleviating factors except as outlined above   Current Medications, Allergies, Complete Past Medical History, Past Surgical History, Family History, and Social History were reviewed in [employer] record.  ROS  The following are not active complaints unless bolded sore throat, dysphagia, dental problems, itching, sneezing,  nasal congestion or excess/ purulent secretions, ear ache,   fever, chills, sweats, unintended wt loss, pleuritic or exertional cp, hemoptysis,  orthopnea pnd or leg swelling, presyncope, palpitations, abdominal pain, anorexia, nausea, vomiting, diarrhea  or change in bowel or urinary habits, change in stools or  urine, dysuria,hematuria,  rash, arthralgias, visual complaints, headache, numbness weakness or ataxia or problems with walking or coordination,  change in mood/affect or memory.           Objective:   Physical Exam   08/04/2014          182 Wt Readings from Last 3 Encounters:  07/06/14 186 lb 3.2 oz (84.46 kg)  06/20/09 206 lb (93.441 kg)  10/17/08 214 lb 6.1 oz (97.243 kg)    Vital signs reviewed   HEENT: nl dentition, turbinates, and orophanx. Nl external ear canals without cough reflex   NECK :  without JVD/Nodes/TM/ nl carotid upstrokes bilaterally   LUNGS: no acc  muscle use, clear to A and P bilaterally without cough on insp or exp maneuvers   CV:  RRR  no s3 or murmur or increase in P2, no edema   ABD:  soft and nontender with nl excursion in the supine position. No bruits or organomegaly, bowel sounds nl  MS:  warm without deformities, calf tenderness, cyanosis or clubbing  SKIN: warm and dry without lesions    NEURO:  alert, approp, no deficits     I personally reviewed images and agree with radiology impression as follows:  CXR: 08/04/2014  Chronic interstitial lung disease with resolution of confluent right midlung opacity since the prior study. No definite evidence of acute airspace disease on the current examination.   Labs  reviewed:  Lab 07/06/14 1614  HGB 9.4*  HCT 28.0*  WBC 4.4  PLT 375.0      Lab Results  Component Value Date   ESRSEDRATE 117* 07/06/2014         Assessment & Plan:

## 2014-08-04 NOTE — Patient Instructions (Signed)
Please remember to go to the  x-ray department downstairs for your tests - we will call you with the results when they are available.  Bronchiectasis =   you have scarring of your bronchial tubes which means that they don't function perfectly normally and mucus tends to pool in certain areas of your lung which can cause pneumonia and further scarring of your lung and bronchial tubes  Whenever you develop cough congestion take mucinex or mucinex dm (don't take delsym with it) > these will help keep the mucus loose and flowing but if your condition worsens you need to seek help immediately preferably here or somewhere inside the [facility] system to compare xrays ( worse = darker or bloody mucus or pain on breathing in)    Use the flutter valve as much as you can - you can't use it too much   Please schedule a follow up office visit in 6 weeks, call sooner if needed pfts you will need prevar if you haven't had it.

## 2014-08-05 ENCOUNTER — Encounter: Payer: Self-pay | Admitting: Internal Medicine

## 2014-08-05 NOTE — Assessment & Plan Note (Addendum)
CT 01/15/09 Stable bronchiectasis and cystic changes throughout both lungs, most extensive in the right upper lobe, since the prior examination from June, 2010. 2. Ground-glass opacities in the lower lobes, improved since the prior examination. This is consistent with improved but residual mild acute inflammatory changes. - alpha one AT 07/06/14 >>  MM   Adequate control on present rx, reviewed > no change in rx needed    I had an extended discussion with the patient and husband reviewing all relevant studies completed to date and  lasting 15 to 20 minutes of a 25 minute visit    1) pathophysiology and rx of bronchiectasis discussed  2) assoc with gerd unclear but may be chicken and egg and needs to avoid thorat clearing which promotes more gerd in any case  3) flutter valve discussed  4) needs pfts next ov ? Benefit from adding bronchodilators  5) Each maintenance medication was reviewed in detail including most importantly the difference between maintenance and prns and under what circumstances the prns are to be triggered using an action plan format that is not reflected in the computer generated alphabetically organized AVS.    Please see instructions for details which were reviewed in writing and the patient given a copy highlighting the part that I personally wrote and discussed at today's ov.

## 2014-08-07 ENCOUNTER — Telehealth: Payer: Self-pay | Admitting: Internal Medicine

## 2014-08-07 NOTE — Telephone Encounter (Signed)
Result Note     Call pt: Reviewed cxr and no acute change so no change in recommendations made at ov  --  I spoke with patient about results and she verbalized understanding and had no questions 

## 2014-08-07 NOTE — Progress Notes (Signed)
Quick Note:  LMTCB ______ 

## 2014-08-14 ENCOUNTER — Other Ambulatory Visit: Payer: Self-pay | Admitting: Gynecology

## 2014-08-15 LAB — CYTOLOGY - PAP

## 2014-09-19 ENCOUNTER — Ambulatory Visit: Payer: Self-pay | Admitting: Internal Medicine

## 2014-09-19 ENCOUNTER — Ambulatory Visit (INDEPENDENT_AMBULATORY_CARE_PROVIDER_SITE_OTHER): Payer: BC Managed Care – PPO | Admitting: Internal Medicine

## 2014-09-19 ENCOUNTER — Encounter: Payer: Self-pay | Admitting: Internal Medicine

## 2014-09-19 VITALS — BP 114/72 | HR 106 | Ht 64.5 in | Wt 179.0 lb

## 2014-09-19 DIAGNOSIS — R05 Cough: Secondary | ICD-10-CM

## 2014-09-19 DIAGNOSIS — J479 Bronchiectasis, uncomplicated: Secondary | ICD-10-CM

## 2014-09-19 DIAGNOSIS — J471 Bronchiectasis with (acute) exacerbation: Secondary | ICD-10-CM

## 2014-09-19 DIAGNOSIS — Z23 Encounter for immunization: Secondary | ICD-10-CM

## 2014-09-19 DIAGNOSIS — R058 Other specified cough: Secondary | ICD-10-CM

## 2014-09-19 LAB — PULMONARY FUNCTION TEST
FEF 25-75 Post: 1.65 L/sec
FEF 25-75 Pre: 0.99 L/sec
FEF2575-%Change-Post: 66 %
FEF2575-%Pred-Post: 84 %
FEF2575-%Pred-Pre: 50 %
FEV1-%Change-Post: 27 %
FEV1-%Pred-Post: 103 %
FEV1-%Pred-Pre: 81 %
FEV1-Post: 2.09 L
FEV1-Pre: 1.65 L
FEV1FVC-%Change-Post: 28 %
FEV1FVC-%Pred-Pre: 70 %
FEV6-%Change-Post: -4 %
FEV6-%Pred-Post: 111 %
FEV6-%Pred-Pre: 117 %
FEV6-Post: 2.8 L
FEV6-Pre: 2.94 L
FEV6FVC-%Change-Post: 0 %
FEV6FVC-%Pred-Post: 103 %
FEV6FVC-%Pred-Pre: 103 %
FVC-%Change-Post: 0 %
FVC-%Pred-Post: 112 %
FVC-%Pred-Pre: 113 %
FVC-Post: 2.92 L
FVC-Pre: 2.94 L
Post FEV1/FVC ratio: 72 %
Post FEV6/FVC ratio: 100 %
Pre FEV1/FVC ratio: 56 %
Pre FEV6/FVC Ratio: 100 %
RV % pred: 99 %
RV: 2.09 L
TLC % pred: 95 %
TLC: 4.89 L

## 2014-09-19 MED ORDER — MOMETASONE FURO-FORMOTEROL FUM 100-5 MCG/ACT IN AERO: INHALATION_SPRAY | RESPIRATORY_TRACT | Status: DC

## 2014-09-19 NOTE — Progress Notes (Signed)
Subjective:    Patient ID: Brandy Medina, female    DOB: 1950/10/07    MRN: [id number]    Brief patient profile:  51 yobf quit smoking 1995 eval by [facility] for Bronchiectasis and sinusitis in 2010 but no regular f/u by pulmonary/ ent with flares several per times a year  and always with pnds and referred by Dr Lysle Rubens 07/06/2014 to pulmonary clinic for persistent daily cough since March 2016    History of Present Illness  07/06/2014 1st [facility] office visit/ [name]   Chief Complaint  Patient presents with  . Pulmonary Consult    Referred by Dr Deforest Hoyles. C/O productive cough with worsening SOB since March 2016. Pt states that mucus is clear in color. Denies fevers or wheezing. Currently on antibiotic d/t PNA on recent CXR   onset of cough  was insidious/ pattern perstitent daily mucus variably tan, sticky never bloody and thicker in am or if turns over in sleep wakes up cough Also sob walking with groceries or flight of steps / advair made it worse /no better with abx and now also sob with activity/ ok breathing at rest as long as not coughing. Presently on levaquin and mucus now clear rec Finish levaquin as you plan  Stop cozar 100  And start diovan 160 mg one daily  Please remember to go to the lab department downstairs for your tests - we will call you with the results when they are available. Prednisone 10 mg take  4 each am x 2 days,   2 each am x 2 days,  1 each am x 2 days and stop For drainage take chlortrimeton (chlorpheniramine) 4 mg every 4 hours available over the counter (may cause drowsiness)  Pantoprazole (protonix) 40 mg   Take  30-60 min before first meal of the day and Pepcid (famotidine)  20 mg one @  bedtime   GERD diet Try over the counter delsym 2 tsp every 12 hours as needed for cough      08/04/2014 f/u ov/Criag Wicklund re: bronchiectasis  Chief Complaint  Patient presents with  . Follow-up    Pt states that her cough is much improved.  When she does cough it is prod  with minimal tan sputum. She states she is having constant PND. Her breathing has improved.   Not limited by breathing from desired activities  / sense of  pnds is day > noct with urge to clear throat disprorportionate to mucus production which is minimal. rec Please remember to go to the  x-ray department downstairs for your tests - we will call you with the results when they are available. Bronchiectasis =   you have scarring of your bronchial tubes which means that they don't function perfectly normally and mucus tends to pool in certain areas of your lung which can cause pneumonia and further scarring of your lung and bronchial tubes Whenever you develop cough congestion take mucinex or mucinex dm (don't take delsym with it) > these will help keep the mucus loose and flowing but if your condition worsens you need to seek help immediately preferably here or somewhere inside the [facility] system to compare xrays ( worse = darker or bloody mucus or pain on breathing in)   Use the flutter valve as much as you can - you can't use it too much    09/19/2014 f/u ov/[name] re: obst bronchiectasis with complete nl airflow p 27% resp to saba  Chief Complaint  Patient presents with  .  Follow-up    PFT done today. Pt states her breathing has improved and her cough is also better. No new co's today.    still not able to sing like she used to, but cough is better. Not limited by breathing from desired activities but not aerobically active  No obvious day to day or daytime variabilty or assoc excess or purulent sputum or cp or chest tightness, subjective wheeze orovert  or hb symptoms. No unusual exp hx or h/o childhood pna/ asthma or knowledge of premature birth.  Sleeping ok without nocturnal  or early am exacerbation  of respiratory  c/o's or need for noct saba. Also denies any obvious fluctuation of symptoms with weather or environmental changes or other aggravating or alleviating factors except as outlined above     Current Medications, Allergies, Complete Past Medical History, Past Surgical History, Family History, and Social History were reviewed in [employer] record.  ROS  The following are not active complaints unless bolded sore throat, dysphagia, dental problems, itching, sneezing,  nasal congestion or excess/ purulent secretions, ear ache,   fever, chills, sweats, unintended wt loss, pleuritic or exertional cp, hemoptysis,  orthopnea pnd or leg swelling, presyncope, palpitations, abdominal pain, anorexia, nausea, vomiting, diarrhea  or change in bowel or urinary habits, change in stools or urine, dysuria,hematuria,  rash, arthralgias, visual complaints, headache, numbness weakness or ataxia or problems with walking or coordination,  change in mood/affect or memory.           Objective:   Physical Exam   08/04/2014          182 >   09/19/2014 179  Wt Readings from Last 3 Encounters:  07/06/14 186 lb 3.2 oz (84.46 kg)  06/20/09 206 lb (93.441 kg)  10/17/08 214 lb 6.1 oz (97.243 kg)    Vital signs reviewed   HEENT: nl dentition, turbinates, and orophanx. Nl external ear canals without cough reflex   NECK :  without JVD/Nodes/TM/ nl carotid upstrokes bilaterally   LUNGS: no acc muscle use, clear to A and P bilaterally without cough on insp or exp maneuvers   CV:  RRR  no s3 or murmur or increase in P2, no edema   ABD:  soft and nontender with nl excursion in the supine position. No bruits or organomegaly, bowel sounds nl  MS:  warm without deformities, calf tenderness, cyanosis or clubbing  SKIN: warm and dry without lesions    NEURO:  alert, approp, no deficits     I personally reviewed images and agree with radiology impression as follows:  CXR: 08/04/2014  Chronic interstitial lung disease with resolution of confluent right midlung opacity since the prior study. No definite evidence of acute airspace disease on the current examination.            Assessment & Plan:

## 2014-09-19 NOTE — Assessment & Plan Note (Signed)
-   allergy profile 07/06/2014 > Eos 0,  IgE 16  Pos RAST dust only  - improved 09/19/2014 with gerd rx > try just pepcid at hs

## 2014-09-19 NOTE — Progress Notes (Signed)
PFT performed today. 

## 2014-09-19 NOTE — Patient Instructions (Signed)
Stop pantoprazole and stay on pepcid at bedside  dulera 100  Take 2 puffs first thing in am and then another 2 puffs about 12 hours later.  Work on inhaler technique:  relax and gently blow all the way out then take a nice smooth deep breath back in, triggering the inhaler at same time you start breathing in.  Hold for up to 5 seconds if you can. Blow out thru nose. Rinse and gargle with water when done    Please schedule a follow up visit in 3 months but call sooner if needed

## 2014-09-19 NOTE — Addendum Note (Signed)
Addended by: Rosana Berger on: 09/19/2014 02:21 PM   Modules accepted: Orders

## 2014-09-19 NOTE — Assessment & Plan Note (Signed)
CT 01/15/09 Stable bronchiectasis and cystic changes throughout both lungs, most extensive in the right upper lobe, since the prior examination from June, 2010. 2. Ground-glass opacities in the lower lobes, improved since the prior examination. This is consistent with improved but residual mild acute inflammatory changes. - alpha one AT 07/06/14 >>  MM  - flutter valve added 08/04/14  - PFT's  09/19/2014  FEV1 2.09 (103 % ) ratio 72  p 27 % improvement from saba - 09/19/2014 p extensive coaching HFA effectiveness =    90% >> dulera 100  2bid trial    I had an extended discussion with the patient reviewing all relevant studies completed to date and  lasting 15 to 20 minutes of a 25 minute visit    1) not clear whether she'll benefit from rx for the asthmatic component present but worth a try  2) prevnar due now and pneumovax in one year   Each maintenance medication was reviewed in detail including most importantly the difference between maintenance and prns and under what circumstances the prns are to be triggered using an action plan format that is not reflected in the computer generated alphabetically organized AVS.    Please see instructions for details which were reviewed in writing and the patient given a copy highlighting the part that I personally wrote and discussed at today's ov.

## 2014-10-01 ENCOUNTER — Other Ambulatory Visit: Payer: Self-pay | Admitting: Internal Medicine

## 2014-12-25 ENCOUNTER — Encounter: Payer: Self-pay | Admitting: Internal Medicine

## 2014-12-25 ENCOUNTER — Ambulatory Visit (INDEPENDENT_AMBULATORY_CARE_PROVIDER_SITE_OTHER): Payer: BC Managed Care – PPO | Admitting: Internal Medicine

## 2014-12-25 ENCOUNTER — Ambulatory Visit (INDEPENDENT_AMBULATORY_CARE_PROVIDER_SITE_OTHER)
Admission: RE | Admit: 2014-12-25 | Discharge: 2014-12-25 | Disposition: A | Discharge status: Home / Self Care | Payer: BC Managed Care – PPO | Source: Ambulatory Visit | Attending: Internal Medicine | Admitting: Internal Medicine

## 2014-12-25 VITALS — BP 126/74 | HR 82 | Ht 64.0 in | Wt 178.4 lb

## 2014-12-25 DIAGNOSIS — J471 Bronchiectasis with (acute) exacerbation: Secondary | ICD-10-CM

## 2014-12-25 DIAGNOSIS — K219 Gastro-esophageal reflux disease without esophagitis: Secondary | ICD-10-CM | POA: Diagnosis not present

## 2014-12-25 DIAGNOSIS — J479 Bronchiectasis, uncomplicated: Secondary | ICD-10-CM

## 2014-12-25 MED ORDER — MOMETASONE FURO-FORMOTEROL FUM 100-5 MCG/ACT IN AERO: 2.0000 | INHALATION_SPRAY | Freq: Every day | RESPIRATORY_TRACT | Status: DC

## 2014-12-25 MED ORDER — PANTOPRAZOLE SODIUM 40 MG PO TBEC: DELAYED_RELEASE_TABLET | ORAL | Status: DC

## 2014-12-25 NOTE — Progress Notes (Signed)
Subjective:    Patient ID: Brandy Medina, female    DOB: 1950/10/07    MRN: [id number]    Brief patient profile:  51 yobf quit smoking 1995 eval by [facility] for Bronchiectasis and sinusitis in 2010 but no regular f/u by pulmonary/ ent with flares several per times a year  and always with pnds and referred by Dr Lysle Rubens 07/06/2014 to pulmonary clinic for persistent daily cough since March 2016    History of Present Illness  07/06/2014 1st [facility] office visit/ Cephus Tupy   Chief Complaint  Patient presents with  . Pulmonary Consult    Referred by Dr Deforest Hoyles. C/O productive cough with worsening SOB since March 2016. Pt states that mucus is clear in color. Denies fevers or wheezing. Currently on antibiotic d/t PNA on recent CXR   onset of cough  was insidious/ pattern perstitent daily mucus variably tan, sticky never bloody and thicker in am or if turns over in sleep wakes up cough Also sob walking with groceries or flight of steps / advair made it worse /no better with abx and now also sob with activity/ ok breathing at rest as long as not coughing. Presently on levaquin and mucus now clear rec Finish levaquin as you plan  Stop cozar 100  And start diovan 160 mg one daily  Please remember to go to the lab department downstairs for your tests - we will call you with the results when they are available. Prednisone 10 mg take  4 each am x 2 days,   2 each am x 2 days,  1 each am x 2 days and stop For drainage take chlortrimeton (chlorpheniramine) 4 mg every 4 hours available over the counter (may cause drowsiness)  Pantoprazole (protonix) 40 mg   Take  30-60 min before first meal of the day and Pepcid (famotidine)  20 mg one @  bedtime   GERD diet Try over the counter delsym 2 tsp every 12 hours as needed for cough      08/04/2014 f/u ov/Leonidus Rowand re: bronchiectasis  Chief Complaint  Patient presents with  . Follow-up    Pt states that her cough is much improved.  When she does cough it is prod  with minimal tan sputum. She states she is having constant PND. Her breathing has improved.   Not limited by breathing from desired activities  / sense of  pnds is day > noct with urge to clear throat disprorportionate to mucus production which is minimal. rec Please remember to go to the  x-ray department downstairs for your tests - we will call you with the results when they are available. Bronchiectasis =   you have scarring of your bronchial tubes which means that they don't function perfectly normally and mucus tends to pool in certain areas of your lung which can cause pneumonia and further scarring of your lung and bronchial tubes Whenever you develop cough congestion take mucinex or mucinex dm (don't take delsym with it) > these will help keep the mucus loose and flowing but if your condition worsens you need to seek help immediately preferably here or somewhere inside the [facility] system to compare xrays ( worse = darker or bloody mucus or pain on breathing in)   Use the flutter valve as much as you can - you can't use it too much    09/19/2014 f/u ov/Torin Modica re: obst bronchiectasis with complete nl airflow p 27% resp to saba  Chief Complaint  Patient presents with  .  Follow-up    PFT done today. Pt states her breathing has improved and her cough is also better. No new co's today.    still not able to sing like she used to, but cough is better. Not limited by breathing from desired activities but not aerobically active rec Stop pantoprazole and stay on pepcid at bedside dulera 100  Take 2 puffs first thing in am and then another 2 puffs about 12 hours later. Work on inhaler technique:  relax and gently blow all the way out then take a nice smooth deep breath back in, triggering the inhaler at same time you start breathing in.  Hold for up to 5 seconds if you can. Blow out thru nose. Rinse and gargle with water when done     12/25/2014  f/u ov/Daya Dutt re:  Chief Complaint  Patient presents with   . Follow-up    Pt states she has been coughing more for the past wk- prod with clear to yellow sputum.    Able to cross parking lots, up steps no sob sob at all/ much better since dulera rx despite poor hfa Some worse in pm's typically supper using pepcid at 20  qhs  Some worse nasal congesiton / bleeding not on nasal steroids   No obvious day to day or daytime variabilty or assoc excess or purulent sputum or cp or chest tightness, subjective wheeze or overt  hb symptoms. No unusual exp hx or h/o childhood pna/ asthma or knowledge of premature birth.  Sleeping ok without nocturnal  or early am exacerbation  of respiratory  c/o's or need for noct saba. Also denies any obvious fluctuation of symptoms with weather or environmental changes or other aggravating or alleviating factors except as outlined above   Current Medications, Allergies, Complete Past Medical History, Past Surgical History, Family History, and Social History were reviewed in [employer] record.  ROS  The following are not active complaints unless bolded sore throat, dysphagia, dental problems, itching, sneezing,  nasal congestion or excess/ purulent secretions, ear ache,   fever, chills, sweats, unintended wt loss, pleuritic or exertional cp, hemoptysis,  orthopnea pnd or leg swelling, presyncope, palpitations, abdominal pain, anorexia, nausea, vomiting, diarrhea  or change in bowel or urinary habits, change in stools or urine, dysuria,hematuria,  rash, arthralgias, visual complaints, headache, numbness weakness or ataxia or problems with walking or coordination,  change in mood/affect or memory.           Objective:   Physical Exam   08/04/2014          182 >   09/19/2014 179 > 12/25/2014  178  Wt Readings from Last 3 Encounters:  07/06/14 186 lb 3.2 oz (84.46 kg)  06/20/09 206 lb (93.441 kg)  10/17/08 214 lb 6.1 oz (97.243 kg)    Vital signs reviewed   HEENT: nl dentition, turbinates, and  orophanx. Nl external ear canals without cough reflex   NECK :  without JVD/Nodes/TM/ nl carotid upstrokes bilaterally   LUNGS: no acc muscle use, clear to A and P bilaterally without cough on insp or exp maneuvers   CV:  RRR  no s3 or murmur or increase in P2, no edema   ABD:  soft and nontender with nl excursion in the supine position. No bruits or organomegaly, bowel sounds nl  MS:  warm without deformities, calf tenderness, cyanosis or clubbing  SKIN: warm and dry without lesions    NEURO:  alert, approp, no deficits  I personally reviewed images and agree with radiology impression as follows:  CXR: 08/04/2014  Chronic interstitial lung disease with resolution of confluent right midlung opacity since the prior study. No definite evidence of acute airspace disease on the current examination.           Assessment & Plan:

## 2014-12-25 NOTE — Assessment & Plan Note (Addendum)
CT 01/15/09 Stable bronchiectasis and cystic changes throughout both lungs, most extensive in the right upper lobe, since the prior examination from June, 2010. 2. Ground-glass opacities in the lower lobes, improved since the prior examination. This is consistent with improved but residual mild acute inflammatory changes. - alpha one AT 07/06/14 >>  MM  - flutter valve added 08/04/14  - PFT's  09/19/2014  FEV1 2.09 (103 % ) ratio 72  p 27 % improvement from saba - 09/19/2014   90% >> dulera 100  2bid trial    - 12/25/2014  extensive coaching HFA effectiveness =    75% from a baseline of < 50%   Actually doing very well on present rx except for control of nasal symptoms > needs sinus CT next  I had an extended discussion with the patient reviewing all relevant studies completed to date and  lasting 15 to 20 minutes of a 25 minute visit    Each maintenance medication was reviewed in detail including most importantly the difference between maintenance and prns and under what circumstances the prns are to be triggered using an action plan format that is not reflected in the computer generated alphabetically organized AVS.    Please see instructions for details which were reviewed in writing and the patient given a copy highlighting the part that I personally wrote and discussed at today's ov.   F/u can be q 6 months - sooner if needed

## 2014-12-25 NOTE — Patient Instructions (Addendum)
Work on Doctor, hospital technique:  relax and gently blow all the way out then take a nice smooth deep breath back in, triggering the inhaler at same time you start breathing in.  Hold for up to 5 seconds if you can. Blow out thru nose. Rinse and gargle with water when done  Please see patient coordinator before you leave today  to schedule sinus CT  When coughing for any reason > Pantoprazole (protonix) 40 mg   Take  30-60 min before first meal of the day and Pepcid (famotidine)  20 mg one @  bedtime then when better reduce the acid suppression to just take it after supper     Please schedule a follow up visit in 6 months but call sooner if needed

## 2014-12-25 NOTE — Assessment & Plan Note (Signed)
Clinical dx only   Explained the natural history of uri and why it's necessary in patients at risk to treat GERD aggressively - at least  short term -   to reduce risk of evolving cyclical cough initially  triggered by epithelial injury and a heightened sensitivty to the effects of any upper airway irritants,  most importantly acid - related - then perpetuated by epithelial injury related to the cough itself as the upper airway collapses on itself.  That is, the more sensitive the epithelium becomes once it is damaged by the virus, the more the ensuing irritability> the more the cough, the more the secondary reflux (especially in those prone to reflux) the more the irritation of the sensitive mucosa and so on in a  Classic cyclical pattern.

## 2014-12-25 NOTE — Addendum Note (Signed)
Addended by: Jerrol Banana on: 12/25/2014 09:31 AM   Modules accepted: Orders

## 2015-06-26 ENCOUNTER — Ambulatory Visit: Payer: Self-pay | Admitting: Internal Medicine

## 2015-07-19 ENCOUNTER — Other Ambulatory Visit: Payer: Self-pay | Admitting: *Deleted

## 2015-07-19 MED ORDER — VALSARTAN 160 MG PO TABS: 160.0000 mg | ORAL_TABLET | Freq: Every day | ORAL | Status: DC

## 2015-10-02 ENCOUNTER — Ambulatory Visit (INDEPENDENT_AMBULATORY_CARE_PROVIDER_SITE_OTHER): Payer: Medicare Other | Admitting: Internal Medicine

## 2015-10-02 ENCOUNTER — Encounter: Payer: Self-pay | Admitting: Internal Medicine

## 2015-10-02 VITALS — BP 114/78 | HR 72 | Ht 64.25 in | Wt 179.0 lb

## 2015-10-02 DIAGNOSIS — J479 Bronchiectasis, uncomplicated: Secondary | ICD-10-CM

## 2015-10-02 DIAGNOSIS — R05 Cough: Secondary | ICD-10-CM | POA: Diagnosis not present

## 2015-10-02 DIAGNOSIS — J471 Bronchiectasis with (acute) exacerbation: Secondary | ICD-10-CM

## 2015-10-02 DIAGNOSIS — Z23 Encounter for immunization: Secondary | ICD-10-CM | POA: Diagnosis not present

## 2015-10-02 DIAGNOSIS — R058 Other specified cough: Secondary | ICD-10-CM

## 2015-10-02 NOTE — Progress Notes (Signed)
Subjective:    Patient ID: Brandy Medina, female    DOB: 11/05/1950    MRN: [id number]    Brief patient profile:  9 yobf quit smoking 1995 eval by [facility] for Bronchiectasis and sinusitis in 2010 but no regular f/u by pulmonary/ ent with flares several per times a year  and always with pnds and referred by Dr Lysle Rubens 07/06/2014 to pulmonary clinic for persistent daily cough since March 2016    History of Present Illness  07/06/2014 1st [facility] office visit/ Lanny Donoso   Chief Complaint  Patient presents with  . Pulmonary Consult    Referred by Dr Deforest Hoyles. C/O productive cough with worsening SOB since March 2016. Pt states that mucus is clear in color. Denies fevers or wheezing. Currently on antibiotic d/t PNA on recent CXR   onset of cough  was insidious/ pattern perstitent daily mucus variably tan, sticky never bloody and thicker in am or if turns over in sleep wakes up cough Also sob walking with groceries or flight of steps / advair made it worse /no better with abx and now also sob with activity/ ok breathing at rest as long as not coughing. Presently on levaquin and mucus now clear rec Finish levaquin as you plan  Stop cozar 100  And start diovan 160 mg one daily  Please remember to go to the lab department downstairs for your tests - we will call you with the results when they are available. Prednisone 10 mg take  4 each am x 2 days,   2 each am x 2 days,  1 each am x 2 days and stop For drainage take chlortrimeton (chlorpheniramine) 4 mg every 4 hours available over the counter (may cause drowsiness)  Pantoprazole (protonix) 40 mg   Take  30-60 min before first meal of the day and Pepcid (famotidine)  20 mg one @  bedtime   GERD diet Try over the counter delsym 2 tsp every 12 hours as needed for cough      08/04/2014 f/u ov/Brandy Medina re: bronchiectasis  Chief Complaint  Patient presents with  . Follow-up    Pt states that her cough is much improved.  When she does cough it is prod  with minimal tan sputum. She states she is having constant PND. Her breathing has improved.   Not limited by breathing from desired activities  / sense of  pnds is day > noct with urge to clear throat disprorportionate to mucus production which is minimal. rec Please remember to go to the  x-ray department downstairs for your tests - we will call you with the results when they are available. Bronchiectasis =   you have scarring of your bronchial tubes which means that they don't function perfectly normally and mucus tends to pool in certain areas of your lung which can cause pneumonia and further scarring of your lung and bronchial tubes Whenever you develop cough congestion take mucinex or mucinex dm (don't take delsym with it) > these will help keep the mucus loose and flowing but if your condition worsens you need to seek help immediately preferably here or somewhere inside the [facility] system to compare xrays ( worse = darker or bloody mucus or pain on breathing in)   Use the flutter valve as much as you can - you can't use it too much    09/19/2014 f/u ov/Brandy Medina re: obst bronchiectasis with complete nl airflow p 27% resp to saba  Chief Complaint  Patient presents with  .  Follow-up    PFT done today. Pt states her breathing has improved and her cough is also better. No new co's today.    still not able to sing like she used to, but cough is better. Not limited by breathing from desired activities but not aerobically active rec Stop pantoprazole and stay on pepcid at bedside dulera 100  Take 2 puffs first thing in am and then another 2 puffs about 12 hours later. Work on inhaler technique:  relax and gently blow all the way out then take a nice smooth deep breath back in, triggering the inhaler at same time you start breathing in.  Hold for up to 5 seconds if you can. Blow out thru nose. Rinse and gargle with water when done     12/25/2014  f/u ov/Brandy Medina re:   Chief Complaint  Patient presents  with  . Follow-up    Pt states she has been coughing more for the past wk- prod with clear to yellow sputum.   Able to cross parking lots, up steps no sob sob at all/ much better since dulera rx despite poor hfa Some worse in pm's typically supper using pepcid at 20  qhs  Some worse nasal congesiton / bleeding not on nasal steroids rec Work on Product manager inhaler technique:   Please see patient coordinator before you leave today  to schedule sinus CT When coughing for any reason > Pantoprazole (protonix) 40 mg   Take  30-60 min before first meal of the day and Pepcid (famotidine)  20 mg one @  bedtime then when better reduce the acid suppression to just take it after supper      10/02/2015  f/u ov/Brandy Medina re: obstructive bronchiectasis  Chief Complaint  Patient presents with  . Follow-up    Breathing is "fine". She c/o PND "always" and prod cough with green sputum.    Teoh eval neg  Sense of drainage is worse at hs at least 3 noct per week / flonase hs no better  No longer on pepcid at all/ intermittently using ppi due to concerns with renal fnx Not limited by breathing from desired activities  - even climbing steps    No obvious day to day or daytime variabilty or assoc excess or purulent sputum or cp or chest tightness, subjective wheeze or overt  hb symptoms. No unusual exp hx or h/o childhood pna/ asthma or knowledge of premature birth.  Sleeping ok without nocturnal  or early am exacerbation  of respiratory  c/o's or need for noct saba. Also denies any obvious fluctuation of symptoms with weather or environmental changes or other aggravating or alleviating factors except as outlined above   Current Medications, Allergies, Complete Past Medical History, Past Surgical History, Family History, and Social History were reviewed in [employer] record.  ROS  The following are not active complaints unless bolded sore throat, dysphagia, dental problems, itching, sneezing,   nasal congestion or excess/ purulent secretions, ear ache,   fever, chills, sweats, unintended wt loss, pleuritic or exertional cp, hemoptysis,  orthopnea pnd or leg swelling, presyncope, palpitations, abdominal pain, anorexia, nausea, vomiting, diarrhea  or change in bowel or urinary habits, change in stools or urine, dysuria,hematuria,  rash, arthralgias, visual complaints, headache, numbness weakness or ataxia or problems with walking or coordination,  change in mood/affect or memory.           Objective:   Physical Exam  amb pleasant  Bf nad/ mod hoarse  08/04/2014          182 >   09/19/2014 179 > 12/25/2014  178 > 10/02/2015  179     07/06/14 186 lb 3.2 oz (84.46 kg)  06/20/09 206 lb (93.441 kg)  10/17/08 214 lb 6.1 oz (97.243 kg)    Vital signs reviewed   HEENT: nl dentition, turbinates, and orophanx. Nl external ear canals without cough reflex   NECK :  without JVD/Nodes/TM/ nl carotid upstrokes bilaterally   LUNGS: no acc muscle use, clear to A and P bilaterally without cough on insp or exp maneuvers   CV:  RRR  no s3 or murmur or increase in P2, no edema   ABD:  soft and nontender with nl excursion in the supine position. No bruits or organomegaly, bowel sounds nl  MS:  warm without deformities, calf tenderness, cyanosis or clubbing  SKIN: warm and dry without lesions    NEURO:  alert, approp, no deficits     I personally reviewed images and agree with radiology impression as follows:  CXR: 08/04/2014  Chronic interstitial lung disease with resolution of confluent right midlung opacity since the prior study. No definite evidence of acute airspace disease on the current examination.           Assessment & Plan:

## 2015-10-02 NOTE — Patient Instructions (Addendum)
Pneumoccal and flu vaccines today    Pantoprazole (protonix) 40 mg   Take  30-60 min before first meal of the day and Pepcid (famotidine)  20 mg one @  bedtime until better enough then stop the pantoprazole and take the pepcid 20 mg twice daily (after bfast and one hour before bedtime  For drainage / throat tickle try take CHLORPHENIRAMINE  4 mg - take one every 4 hours as needed - available over the counter- may cause drowsiness so start with just a bedtime dose or two and see how you tolerate it before trying in daytime    In the event of persistent change in color or consistency of your sputum you will need to see me or my NP as needed or see Dr Stefan Church   If you are satisfied with your treatment plan,  let your doctor know and he/she can either refill your medications or you can return here when your prescription runs out.     If in any way you are not 100% satisfied,  please tell us.  If 100% better, tell your friends!  Pulmonary follow up is as needed

## 2015-10-07 ENCOUNTER — Encounter: Payer: Self-pay | Admitting: Internal Medicine

## 2015-10-07 NOTE — Assessment & Plan Note (Signed)
-   allergy profile 07/06/2014 > Eos 0,  IgE 16  Pos RAST dust only  - improved 09/19/2014 with gerd rx > try just pepcid at hs  - Teoh eval neg 2017   I had an extended final summary discussion with the patient reviewing all relevant studies completed to date and  lasting 15 to 20 minutes of a 25 minute visit on the following issues:    1) clearly improved previously on gerd rx  2) Discussed the recent press about ppi's in the context of a statistically significant (but questionably clinically relevant) increase in CRI in pts on ppi vs h2's > bottom line is the lowest dose of ppi that controls   gerd is the right dose and if that dose is zero that's fine esp since h2's are cheaper.    3) ok to add 1st gen h1 prn   4) Each maintenance medication was reviewed in detail including most importantly the difference between maintenance and as needed and under what circumstances the prns are to be used.  Please see instructions for details which were reviewed in writing and the patient given a copy.    Pulmonary f/u is prn

## 2015-10-07 NOTE — Assessment & Plan Note (Addendum)
CT 01/15/09 Stable bronchiectasis and cystic changes throughout both lungs, most extensive in the right upper lobe, since the prior examination from June, 2010. 2. Ground-glass opacities in the lower lobes, improved since the prior examination. This is consistent with improved but residual mild acute inflammatory changes. - alpha one AT 07/06/14 >>  MM  - flutter valve added 08/04/14  - PFT's  09/19/2014  FEV1 2.09 (103 % ) ratio 72  p 27 % improvement from saba - 09/19/2014   90% >> dulera 100  2bid trial  - 12/25/2014  extensive coaching HFA effectiveness =    75% from a baseline of < 50%  - sinus CT 12/25/2014 >Negative paranasal sinuses.   Really doing quite well except for pnds > pulmonary f/u can be prn  - because of multiple abx intolerance do not rec prn abx > ov prn flare

## 2015-10-09 ENCOUNTER — Other Ambulatory Visit: Payer: Self-pay | Admitting: Internal Medicine

## 2015-11-14 ENCOUNTER — Other Ambulatory Visit: Payer: Self-pay | Admitting: Internal Medicine

## 2015-11-15 ENCOUNTER — Encounter: Payer: Self-pay | Admitting: Hematology and Oncology

## 2015-11-15 ENCOUNTER — Telehealth: Payer: Self-pay | Admitting: Hematology and Oncology

## 2015-11-15 NOTE — Telephone Encounter (Signed)
Pt confirmed appt, completed intake, mailed pt letter, faxed referring provider appt date/time °

## 2015-11-20 ENCOUNTER — Ambulatory Visit (HOSPITAL_BASED_OUTPATIENT_CLINIC_OR_DEPARTMENT_OTHER): Payer: Medicare Other | Admitting: Hematology and Oncology

## 2015-11-20 ENCOUNTER — Ambulatory Visit (HOSPITAL_BASED_OUTPATIENT_CLINIC_OR_DEPARTMENT_OTHER): Payer: Medicare Other

## 2015-11-20 ENCOUNTER — Encounter: Payer: Self-pay | Admitting: Hematology and Oncology

## 2015-11-20 DIAGNOSIS — N289 Disorder of kidney and ureter, unspecified: Secondary | ICD-10-CM | POA: Diagnosis not present

## 2015-11-20 DIAGNOSIS — I10 Essential (primary) hypertension: Secondary | ICD-10-CM

## 2015-11-20 DIAGNOSIS — D649 Anemia, unspecified: Secondary | ICD-10-CM

## 2015-11-20 DIAGNOSIS — D472 Monoclonal gammopathy: Secondary | ICD-10-CM | POA: Diagnosis not present

## 2015-11-20 DIAGNOSIS — D890 Polyclonal hypergammaglobulinemia: Secondary | ICD-10-CM | POA: Insufficient documentation

## 2015-11-20 DIAGNOSIS — Z87891 Personal history of nicotine dependence: Secondary | ICD-10-CM

## 2015-11-20 LAB — COMPREHENSIVE METABOLIC PANEL
ALT: 13 U/L (ref 0–55)
AST: 19 U/L (ref 5–34)
Albumin: 3.6 g/dL (ref 3.5–5.0)
Alkaline Phosphatase: 98 U/L (ref 40–150)
Anion Gap: 10 mEq/L (ref 3–11)
BUN: 25.3 mg/dL (ref 7.0–26.0)
CO2: 24 mEq/L (ref 22–29)
Calcium: 10.5 mg/dL — ABNORMAL HIGH (ref 8.4–10.4)
Chloride: 100 mEq/L (ref 98–109)
Creatinine: 1 mg/dL (ref 0.6–1.1)
EGFR: 68 mL/min/{1.73_m2} — ABNORMAL LOW (ref >90)
Glucose: 90 mg/dl (ref 70–140)
Potassium: 4.2 mEq/L (ref 3.5–5.1)
Sodium: 133 mEq/L — ABNORMAL LOW (ref 136–145)
Total Bilirubin: 0.33 mg/dL (ref 0.20–1.20)
Total Protein: 10.3 g/dL — ABNORMAL HIGH (ref 6.4–8.3)

## 2015-11-20 LAB — CBC WITH DIFFERENTIAL/PLATELET
BASO%: 0.2 % (ref 0.0–2.0)
Basophils Absolute: 0 10*3/uL (ref 0.0–0.1)
EOS%: 0.7 % (ref 0.0–7.0)
Eosinophils Absolute: 0 10*3/uL (ref 0.0–0.5)
HCT: 27.7 % — ABNORMAL LOW (ref 34.8–46.6)
HGB: 9.5 g/dL — ABNORMAL LOW (ref 11.6–15.9)
LYMPH%: 24.9 % (ref 14.0–49.7)
MCH: 31.7 pg (ref 25.1–34.0)
MCHC: 34.3 g/dL (ref 31.5–36.0)
MCV: 92.3 fL (ref 79.5–101.0)
MONO#: 0.4 10*3/uL (ref 0.1–0.9)
MONO%: 10.5 % (ref 0.0–14.0)
NEUT#: 2.7 10*3/uL (ref 1.5–6.5)
NEUT%: 63.7 % (ref 38.4–76.8)
Platelets: 249 10*3/uL (ref 145–400)
RBC: 3 10*6/uL — ABNORMAL LOW (ref 3.70–5.45)
RDW: 14.3 % (ref 11.2–14.5)
WBC: 4.2 10*3/uL (ref 3.9–10.3)
lymph#: 1 10*3/uL (ref 0.9–3.3)

## 2015-11-20 NOTE — Progress Notes (Signed)
[facility] NOTE  Patient Care Team: Wenda Low, MD as PCP - General (Internal Medicine)  CHIEF COMPLAINTS/PURPOSE OF CONSULTATION:  Elevated gamma globulin  HISTORY OF PRESENTING ILLNESS:  Brandy Medina 65 y.o. female is here because of recent diagnosis of elevated gamma globulin. Patient has a long-standing history of anemia thought to be related to iron deficiency and previously was on iron supplements as well as a history of von Willebrand disease diagnosed by Dr.Murinson. She was noted to have chronic anemia and workup was performed with serum protein electrophoresis that revealed elevated gamma globulin. Immunofixation was not performed.patient has been fairly healthy apart from hypertension, glucose intolerance for which she takes metformin. She was also noted to have mild renal insufficiency. Anemia that she has is normocytic in nature. She is here today accompanied by her husband.  MEDICAL HISTORY:  Past Medical History:  Diagnosis Date  . Allergic rhinitis   . Diabetes ([place])   . HTN (hypertension)   . Migraines     SURGICAL HISTORY: Past Surgical History:  Procedure Laterality Date  . ABDOMINAL HYSTERECTOMY    . BREAST SURGERY      SOCIAL HISTORY: Social History   Social History  . Marital status: Married    Spouse name: N/A  . Number of children: N/A  . Years of education: N/A   Occupational History  . Not on file.   Social History Main Topics  . Smoking status: Former Smoker    Packs/day: 0.50    Years: 18.00    Types: Cigarettes    Quit date: 07/05/1993  . Smokeless tobacco: Not on file  . Alcohol use Not on file  . Drug use: Unknown  . Sexual activity: Not on file   Other Topics Concern  . Not on file   Social History Narrative  . No narrative on file    FAMILY HISTORY: Family History  Problem Relation Age of Onset  . Asthma Brother   . Bronchitis Mother   . Heart disease Mother   . Heart disease Father   . Heart  attack Father     ALLERGIES:  is allergic to aspirin; avelox [moxifloxacin hcl in nacl]; codeine; erythromycin; lisinopril; and sulfonamide derivatives.  MEDICATIONS:  Current Outpatient Prescriptions  Medication Sig Dispense Refill  . acetaminophen (TYLENOL) 325 MG tablet Take 650 mg by mouth every 6 (six) hours as needed for headache.    . chlorpheniramine (CHLOR-TRIMETON) 4 MG tablet 1 at bedtime and 1 every 4 hours as needed during the day    . dextromethorphan (DELSYM) 30 MG/5ML liquid 2 every 12 hours as needed    . ferrous sulfate 325 (65 FE) MG tablet Take 325 mg by mouth 2 (two) times daily with a meal.     . metFORMIN (GLUCOPHAGE) 500 MG tablet Take 500 mg by mouth daily with breakfast.   0  . mometasone-formoterol (DULERA) 100-5 MCG/ACT AERO Take 2 puffs first thing in am and then another 2 puffs about 12 hours later. (Patient not taking: Reported on 10/02/2015) 1 Inhaler 11  . pantoprazole (PROTONIX) 40 MG tablet take 1 tablet by mouth once daily  30 TO 60 MINUTES NEFORE THE FIRST MEAL OF THE DAY 30 tablet 11  . Respiratory Therapy Supplies (FLUTTER) DEVI Use as directed 1 each 0  . triamterene-hydrochlorothiazide (MAXZIDE-25) 37.5-25 MG per tablet Take 1 tablet by mouth daily.   0  . valsartan (DIOVAN) 160 MG tablet take 1 tablet by mouth once daily  90 tablet 0   No current facility-administered medications for this visit.     REVIEW OF SYSTEMS:   Constitutional: Denies fevers, chills or abnormal night sweats Eyes: Denies blurriness of vision, double vision or watery eyes Ears, nose, mouth, throat, and face: Denies mucositis or sore throat Respiratory: Denies cough, dyspnea or wheezes Cardiovascular: Denies palpitation, chest discomfort or lower extremity swelling Gastrointestinal:  Denies nausea, heartburn or change in bowel habits Skin: Denies abnormal skin rashes Lymphatics: Denies new lymphadenopathy or easy bruising Neurological:Denies numbness, tingling or new  weaknesses Behavioral/Psych: Mood is stable, no new changes   All other systems were reviewed with the patient and are negative.  PHYSICAL EXAMINATION: ECOG PERFORMANCE STATUS: 0 - Asymptomatic  Vitals:   11/20/15 1519  BP: (!) 143/86  Pulse: 89  Resp: 18  Temp: 98.1 F (36.7 C)   Filed Weights   11/20/15 1519  Weight: 176 lb 14.4 oz (80.2 kg)    GENERAL:alert, no distress and comfortable SKIN: skin color, texture, turgor are normal, no rashes or significant lesions EYES: normal, conjunctiva are pink and non-injected, sclera clear OROPHARYNX:no exudate, no erythema and lips, buccal mucosa, and tongue normal  NECK: supple, thyroid normal size, non-tender, without nodularity LYMPH:  no palpable lymphadenopathy in the cervical, axillary or inguinal LUNGS: clear to auscultation and percussion with normal breathing effort HEART: regular rate & rhythm and no murmurs and no lower extremity edema ABDOMEN:abdomen soft, non-tender and normal bowel sounds Musculoskeletal:no cyanosis of digits and no clubbing  PSYCH: alert & oriented x 3 with fluent speech NEURO: no focal motor/sensory deficits  LABORATORY DATA:  I have reviewed the data as listed Lab Results  Component Value Date   WBC 4.4 07/06/2014   HGB 9.4 (L) 07/06/2014   HCT 28.0 (L) 07/06/2014   MCV 90.1 07/06/2014   PLT 375.0 07/06/2014   ASSESSMENT AND PLAN:  Monoclonal gammopathy Elevated gammaglobulins on 11/09/2015: Total protein 9.4 g, gammaglobulin 3.7 g Workup was performed because of normocytic anemia with a hemoglobin of 9.4 g with an MCV of 94.7. WBC 3.5, platelets 242, creatinine 0.9, calcium 9.7, TSH 1.71  The differential diagnosis for elevated gamma globulin is between reactive causes of hypergammaglobulinemia like inflammations or infections versus monoclonal gammopathy is like MGUS or multiple myeloma.  Recommendation: 1. SPEP with IFE 2. quantitative immunoglobulins 3. Kappa: Lambda ratio 4. Beta-2  microglobulin 5. Bone survey 6. If M protein is confirmed, we will perform bone marrow biopsy  Return to clinic in one week to discuss the results the blood work and bone survey.   All questions were answered. The patient knows to call the clinic with any problems, questions or concerns.    Rulon Eisenmenger, MD 11/20/15

## 2015-11-20 NOTE — Assessment & Plan Note (Signed)
Elevated gammaglobulins on 11/09/2015: Total protein 9.4 g, gammaglobulin 3.7 g Workup was performed because of normocytic anemia with a hemoglobin of 9.4 g with an MCV of 94.7. WBC 3.5, platelets 242, creatinine 0.9, calcium 9.7, TSH 1.71  The differential diagnosis for elevated gamma globulin is between reactive causes of hypergammaglobulinemia like inflammations or infections versus monoclonal gammopathy is like MGUS or multiple myeloma.  Recommendation: 1. SPEP with IFE 2. quantitative immunoglobulins 3. Kappa: Lambda ratio 4. Beta-2 microglobulin 5. Bone survey 6. If M protein is confirmed, we will perform bone marrow biopsy   

## 2015-11-21 LAB — IRON AND TIBC
%SAT: 27 % (ref 21–57)
Iron: 74 ug/dL (ref 41–142)
TIBC: 278 ug/dL (ref 236–444)
UIBC: 203 ug/dL (ref 120–384)

## 2015-11-21 LAB — KAPPA/LAMBDA LIGHT CHAINS
Ig Kappa Free Light Chain: 88.3 mg/L — ABNORMAL HIGH (ref 3.3–19.4)
Ig Lambda Free Light Chain: 44.9 mg/L — ABNORMAL HIGH (ref 5.7–26.3)
Kappa/Lambda FluidC Ratio: 1.97 — ABNORMAL HIGH (ref 0.26–1.65)

## 2015-11-21 LAB — BETA 2 MICROGLOBULIN, SERUM: Beta-2: 3.4 mg/L — ABNORMAL HIGH (ref 0.6–2.4)

## 2015-11-21 LAB — FERRITIN: Ferritin: 847 ng/ml — ABNORMAL HIGH (ref 9–269)

## 2015-11-23 LAB — MULTIPLE MYELOMA PANEL, SERUM
Albumin SerPl Elph-Mcnc: 3.8 g/dL (ref 2.9–4.4)
Albumin/Glob SerPl: 0.6 — ABNORMAL LOW (ref 0.7–1.7)
Alpha 1: 0.3 g/dL (ref 0.0–0.4)
Alpha2 Glob SerPl Elph-Mcnc: 1 g/dL (ref 0.4–1.0)
B-Globulin SerPl Elph-Mcnc: 1.1 g/dL (ref 0.7–1.3)
Gamma Glob SerPl Elph-Mcnc: 4.1 g/dL — ABNORMAL HIGH (ref 0.4–1.8)
Globulin, Total: 6.5 g/dL — ABNORMAL HIGH (ref 2.2–3.9)
IgA, Qn, Serum: 194 mg/dL (ref 87–352)
IgG, Qn, Serum: 3944 mg/dL — ABNORMAL HIGH (ref 700–1600)
IgM, Qn, Serum: 460 mg/dL — ABNORMAL HIGH (ref 26–217)
Total Protein: 10.3 g/dL — ABNORMAL HIGH (ref 6.0–8.5)

## 2015-11-27 ENCOUNTER — Ambulatory Visit (HOSPITAL_COMMUNITY)
Admission: RE | Admit: 2015-11-27 | Discharge: 2015-11-27 | Disposition: A | Discharge status: Home / Self Care | Payer: Medicare Other | Source: Ambulatory Visit | Attending: Hematology and Oncology | Admitting: Hematology and Oncology

## 2015-11-27 ENCOUNTER — Encounter: Payer: Self-pay | Admitting: Hematology and Oncology

## 2015-11-27 ENCOUNTER — Ambulatory Visit (HOSPITAL_BASED_OUTPATIENT_CLINIC_OR_DEPARTMENT_OTHER): Payer: Medicare Other | Admitting: Hematology and Oncology

## 2015-11-27 DIAGNOSIS — D472 Monoclonal gammopathy: Secondary | ICD-10-CM | POA: Diagnosis present

## 2015-11-27 DIAGNOSIS — D890 Polyclonal hypergammaglobulinemia: Secondary | ICD-10-CM | POA: Diagnosis not present

## 2015-11-27 DIAGNOSIS — R918 Other nonspecific abnormal finding of lung field: Secondary | ICD-10-CM | POA: Diagnosis not present

## 2015-11-27 NOTE — Assessment & Plan Note (Signed)
Elevated gammaglobulins on 11/09/2015: Total protein 9.4 g, gammaglobulin 3.7 g Workup was performed because of normocytic anemia with a hemoglobin of 9.4 g with an MCV of 94.7. WBC 3.5, platelets 242, creatinine 0.9, calcium 9.7, TSH 1.71  The differential diagnosis for elevated gamma globulin is between reactive causes of hypergammaglobulinemia like inflammations or infections versus monoclonal gammopathy is like MGUS or multiple myeloma.  Results of workup: 1. SPEP: No M protein, polyclonal gammopathy with IgG 3000 944, IgM 460 2. Kappa and Lambda polyclonal elevation: Kappa 88.3, lambda 44.9, ratio 1.97  3. Beta-2 microglobulin 3.4 mildly elevated  4. Serum calcium level 10.5: Will need to be evaluated with parathyroid hormone testing. 5. Bone survey scheduled for today.  I discussed with the patient that she does not have MGUS or monoclonal gammopathy or myeloma. This is a reactive process to either inflammation or infection. I do not believe there is any necessity to do a bone marrow biopsy.

## 2015-11-27 NOTE — Progress Notes (Signed)
Patient Care Team: Wenda Low, MD as PCP - General (Internal Medicine)  DIAGNOSIS:  Encounter Diagnosis  Name Primary?  . Polyclonal gammopathy determined by serum protein electrophoresis    CHIEF COMPLIANT: Follow-up to discuss the blood work  INTERVAL HISTORY: Brandy Medina is a 65 year old with above-mentioned history of elevated total protein who had extensive blood work to evaluate the cause of this and is here today to discuss the results. The blood work revealed that she has polyclonal gammopathy. There was no monoclonal elevation of proteins are light chains. There was clear evidence of underlying inflammation. Patient complains of diffuse arthritis in multiple joints in her body.  REVIEW OF SYSTEMS:   Constitutional: Denies fevers, chills or abnormal weight loss Eyes: Denies blurriness of vision Ears, nose, mouth, throat, and face: Denies mucositis or sore throat Respiratory: Denies cough, dyspnea or wheezes Cardiovascular: Denies palpitation, chest discomfort Gastrointestinal:  Denies nausea, heartburn or change in bowel habits Skin: Denies abnormal skin rashes Lymphatics: Denies new lymphadenopathy or easy bruising Neurological:Denies numbness, tingling or new weaknesses Behavioral/Psych: Mood is stable, no new changes  Extremities: No lower extremity edema, arthritis  All other systems were reviewed with the patient and are negative.  I have reviewed the past medical history, past surgical history, social history and family history with the patient and they are unchanged from previous note.  ALLERGIES:  is allergic to aspirin; avelox [moxifloxacin hcl in nacl]; codeine; erythromycin; lisinopril; and sulfonamide derivatives.  MEDICATIONS:  Current Outpatient Prescriptions  Medication Sig Dispense Refill  . acetaminophen (TYLENOL) 325 MG tablet Take 650 mg by mouth every 6 (six) hours as needed for headache.    . chlorpheniramine (CHLOR-TRIMETON) 4 MG tablet 1 at  bedtime and 1 every 4 hours as needed during the day    . dextromethorphan (DELSYM) 30 MG/5ML liquid 2 every 12 hours as needed    . ferrous sulfate 325 (65 FE) MG tablet Take 325 mg by mouth 2 (two) times daily with a meal.     . metFORMIN (GLUCOPHAGE) 500 MG tablet Take 500 mg by mouth daily with breakfast.   0  . mometasone-formoterol (DULERA) 100-5 MCG/ACT AERO Take 2 puffs first thing in am and then another 2 puffs about 12 hours later. (Patient not taking: Reported on 10/02/2015) 1 Inhaler 11  . pantoprazole (PROTONIX) 40 MG tablet take 1 tablet by mouth once daily  30 TO 60 MINUTES NEFORE THE FIRST MEAL OF THE DAY 30 tablet 11  . Respiratory Therapy Supplies (FLUTTER) DEVI Use as directed 1 each 0  . triamterene-hydrochlorothiazide (MAXZIDE-25) 37.5-25 MG per tablet Take 1 tablet by mouth daily.   0  . valsartan (DIOVAN) 160 MG tablet take 1 tablet by mouth once daily 90 tablet 0   No current facility-administered medications for this visit.     PHYSICAL EXAMINATION: ECOG PERFORMANCE STATUS: 1 - Symptomatic but completely ambulatory  Vitals:   11/27/15 1433  BP: 140/77  Pulse: 85  Resp: 18  Temp: 98.1 F (36.7 C)   Filed Weights   11/27/15 1433  Weight: 181 lb 3.2 oz (82.2 kg)    GENERAL:alert, no distress and comfortable SKIN: skin color, texture, turgor are normal, no rashes or significant lesions EYES: normal, Conjunctiva are pink and non-injected, sclera clear OROPHARYNX:no exudate, no erythema and lips, buccal mucosa, and tongue normal  NECK: supple, thyroid normal size, non-tender, without nodularity LYMPH:  no palpable lymphadenopathy in the cervical, axillary or inguinal LUNGS: clear to auscultation and percussion  with normal breathing effort HEART: regular rate & rhythm and no murmurs and no lower extremity edema ABDOMEN:abdomen soft, non-tender and normal bowel sounds MUSCULOSKELETAL:no cyanosis of digits and no clubbing  NEURO: alert & oriented x 3 with fluent  speech, no focal motor/sensory deficits EXTREMITIES: No lower extremity edema  LABORATORY DATA:  I have reviewed the data as listed   Chemistry      Component Value Date/Time   NA 133 (L) 11/20/2015 1608   K 4.2 11/20/2015 1608   CO2 24 11/20/2015 1608   BUN 25.3 11/20/2015 1608   CREATININE 1.0 11/20/2015 1608      Component Value Date/Time   CALCIUM 10.5 (H) 11/20/2015 1608   ALKPHOS 98 11/20/2015 1608   AST 19 11/20/2015 1608   ALT 13 11/20/2015 1608   BILITOT 0.33 11/20/2015 1608       Lab Results  Component Value Date   WBC 4.2 11/20/2015   HGB 9.5 (L) 11/20/2015   HCT 27.7 (L) 11/20/2015   MCV 92.3 11/20/2015   PLT 249 11/20/2015   NEUTROABS 2.7 11/20/2015     ASSESSMENT & PLAN:  Polyclonal gammopathy determined by serum protein electrophoresis Elevated gammaglobulins on 11/09/2015: Total protein 9.4 g, gammaglobulin 3.7 g Workup was performed because of normocytic anemia with a hemoglobin of 9.4 g with an MCV of 94.7. WBC 3.5, platelets 242, creatinine 0.9, calcium 9.7, TSH 1.71  The differential diagnosis for elevated gamma globulin is between reactive causes of hypergammaglobulinemia like inflammations or infections versus monoclonal gammopathy is like MGUS or multiple myeloma.  Results of workup: 1. SPEP: No M protein, polyclonal gammopathy with IgG 3000 944, IgM 460 2. Kappa and Lambda polyclonal elevation: Kappa 88.3, lambda 44.9, ratio 1.97  3. Beta-2 microglobulin 3.4 mildly elevated  4. Serum calcium level 10.5: Will need to be evaluated with parathyroid hormone testing. 5. Bone survey: Neg for lytic lesions 6. Elevated ferritin: Acute phase reactant related to underlying inflammation   I discussed with the patient that she does not have MGUS or monoclonal gammopathy or myeloma. This is a reactive process to either inflammation or infection. I do not believe there is any necessity to do a bone marrow biopsy.  I sent a referral to rheumatology for  further evaluation of her chronic inflammatory state.   Orders Placed This Encounter  Procedures  . Ambulatory referral to Rheumatology    Referral Priority:   Routine    Referral Type:   Consultation    Referral Reason:   Specialty Services Required    Referred to Provider:   Hennie Duos, MD    Requested Specialty:   Rheumatology    Number of Visits Requested:   1   The patient has a good understanding of the overall plan. she agrees with it. she will call with any problems that may develop before the next visit here.   Rulon Eisenmenger, MD 11/27/15

## 2015-12-06 ENCOUNTER — Telehealth: Payer: Self-pay | Admitting: Hematology and Oncology

## 2015-12-06 NOTE — Telephone Encounter (Signed)
Faxed pt records to rheumatology [phone]

## 2015-12-12 DIAGNOSIS — R9431 Abnormal electrocardiogram [ECG] [EKG]: Secondary | ICD-10-CM | POA: Insufficient documentation

## 2015-12-13 ENCOUNTER — Encounter: Payer: Self-pay | Admitting: Interventional Cardiology

## 2015-12-13 ENCOUNTER — Ambulatory Visit (INDEPENDENT_AMBULATORY_CARE_PROVIDER_SITE_OTHER): Payer: Medicare Other | Admitting: Interventional Cardiology

## 2015-12-13 VITALS — BP 106/72 | HR 77 | Ht 64.0 in | Wt 176.4 lb

## 2015-12-13 DIAGNOSIS — I10 Essential (primary) hypertension: Secondary | ICD-10-CM

## 2015-12-13 DIAGNOSIS — R9431 Abnormal electrocardiogram [ECG] [EKG]: Secondary | ICD-10-CM | POA: Diagnosis not present

## 2015-12-13 DIAGNOSIS — I4510 Unspecified right bundle-branch block: Secondary | ICD-10-CM | POA: Diagnosis not present

## 2015-12-13 NOTE — Progress Notes (Signed)
Cardiology Office Note    Date:  12/13/2015   ID:  Brandy Medina, DOB [birth year], MRN [id number]  PCP:  Wenda Low, MD  Cardiologist: Sinclair Grooms, MD   Chief Complaint  Patient presents with  . Abnormal ECG    History of Present Illness:  Brandy Medina is a 65 y.o. female with a history of bronchiectasis, prior smoker, family history of heart disease, chronic anemia, who is referred for evaluation of right bundle branch block.  The patient has no cardiopulmonary complaints. On a wellness exam she was noted to have right bundle branch block. She denies exertional chest discomfort. She denies palpitations. She has not had syncope. No history of heart disease.    Past Medical History:  Diagnosis Date  . Allergic rhinitis   . Diabetes ([place])   . HTN (hypertension)   . Migraines     Past Surgical History:  Procedure Laterality Date  . ABDOMINAL HYSTERECTOMY    . BREAST SURGERY      Current Medications: Outpatient Medications Prior to Visit  Medication Sig Dispense Refill  . acetaminophen (TYLENOL) 325 MG tablet Take 650 mg by mouth every 6 (six) hours as needed for headache.    . butalbital-acetaminophen-caffeine (FIORICET/CODEINE) 50-325-40-30 MG capsule Take 1 capsule by mouth every 8 (eight) hours as needed for headache.    . chlorpheniramine (CHLOR-TRIMETON) 4 MG tablet 1 at bedtime and 1 every 4 hours as needed during the day    . desmopressin (DDAVP) 0.01 % solution Place 10 mcg into the nose as directed. Instill 0.05 drop in nostril once a day at time of procedures/surgery    . dextromethorphan (DELSYM) 30 MG/5ML liquid 2 every 12 hours as needed    . Ergocalciferol (VITAMIN D2) 2000 units TABS Take 1 tablet by mouth 2 (two) times a week.    . famotidine (PEPCID) 20 MG tablet Take 20 mg by mouth 3 times/day as needed-between meals & bedtime for heartburn or indigestion.    . fluticasone (FLONASE) 50 MCG/ACT nasal spray Place 2 sprays into both nostrils daily  as needed for allergies or rhinitis.    . metFORMIN (GLUCOPHAGE) 500 MG tablet Take 500 mg by mouth daily with breakfast.   0  . pantoprazole (PROTONIX) 40 MG tablet take 1 tablet by mouth once daily  30 TO 60 MINUTES NEFORE THE FIRST MEAL OF THE DAY 30 tablet 11  . Respiratory Therapy Supplies (FLUTTER) DEVI Use as directed 1 each 0  . triamterene-hydrochlorothiazide (MAXZIDE-25) 37.5-25 MG per tablet Take 1 tablet by mouth daily.   0  . valsartan (DIOVAN) 160 MG tablet take 1 tablet by mouth once daily 90 tablet 0  . ferrous sulfate 325 (65 FE) MG tablet Take 325 mg by mouth 2 (two) times daily with a meal.      No facility-administered medications prior to visit.      Allergies:   Aspirin; Avelox [moxifloxacin hcl in nacl]; Codeine; Erythromycin; Lisinopril; and Sulfonamide derivatives   Social History   Social History  . Marital status: Married    Spouse name: N/A  . Number of children: N/A  . Years of education: N/A   Social History Main Topics  . Smoking status: Former Smoker    Packs/day: 0.50    Years: 18.00    Types: Cigarettes    Quit date: 07/05/1993  . Smokeless tobacco: Never Used  . Alcohol use None  . Drug use: Unknown  . Sexual activity: Not Asked  Other Topics Concern  . None   Social History Narrative  . None     Family History:  The patient's family history includes Bronchitis in her mother; Diabetes in her sister and sister; Heart attack in her brother and father; Heart disease in her father and mother; Hypertension in her sister and sister; Renal Disease in her brother; Stroke in her brother.   ROS:   Please see the history of present illness.    Inflammation and low blood count, joint stiffness and soreness. Left shoulder discomfort. Chronic cough related to bronchiectasis. Joint swelling.  All other systems reviewed and are negative.   PHYSICAL EXAM:   VS:  BP 106/72 (BP Location: Left Arm)   Pulse 77   Ht 5\' 4"  (1.626 m)   Wt 176 lb 6.4 oz (80  kg)   BMI 30.28 kg/m    GEN: Well nourished, well developed, in no acute distress  HEENT: normal  Neck: no JVD, carotid bruits, or masses Cardiac: RRR; no murmurs, rubs, or gallops,no edema  Respiratory:  clear to auscultation bilaterally, normal work of breathing GI: soft, nontender, nondistended, + BS MS: no deformity or atrophy  Skin: warm and dry, no rash Neuro:  Alert and Oriented x 3, Strength and sensation are intact Psych: euthymic mood, full affect  Wt Readings from Last 3 Encounters:  12/13/15 176 lb 6.4 oz (80 kg)  11/27/15 181 lb 3.2 oz (82.2 kg)  11/20/15 176 lb 14.4 oz (80.2 kg)      Studies/Labs Reviewed:   EKG:  EKG  Normal sinus rhythm, right bundle branch block, and otherwise unremarkable. No old tracings to compare.  Recent Labs: 11/20/2015: ALT 13; BUN 25.3; Creatinine 1.0; HGB 9.5; Platelets 249; Potassium 4.2; Sodium 133   Lipid Panel No results found for: CHOL, TRIG, HDL, CHOLHDL, VLDL, LDLCALC, LDLDIRECT  Additional studies/ records that were reviewed today include:  No prior cardiac studies.  Prior chest CT without contrast in 2010 did not reveal coronary calcification.  Extensive review of records and studies within [facility] were reviewed looking for evidence of vascular disease. None was found.   ASSESSMENT:    1. RBBB   2. Essential hypertension   3. Abnormal ECG      PLAN:  In order of problems listed above:  1. No evidence of underlying heart disease based upon the patient's history and exam. Given history of bronchiectasis and prior smoking, we will perform an echocardiogram to evaluate right heart size and function. We also need to exclude pulmonary hypertension. 2. Well controlled blood pressure on the current medical regimen. Weight loss and low salt diet or endorsed.    Medication Adjustments/Labs and Tests Ordered: Current medicines are reviewed at length with the patient today.  Concerns regarding medicines are  outlined above.  Medication changes, Labs and Tests ordered today are listed in the Patient Instructions below. Patient Instructions  Medication Instructions:  None  Labwork: None  Testing/Procedures: Your physician has requested that you have an echocardiogram. Echocardiography is a painless test that uses sound waves to create images of your heart. It provides your doctor with information about the size and shape of your heart and how well your heart's chambers and valves are working. This procedure takes approximately one hour. There are no restrictions for this procedure.    Follow-Up: Your physician recommends that you schedule a follow-up appointment as needed with Dr. Tamala Julian.    Any Other Special Instructions Will Be Listed Below (If Applicable).  If you need a refill on your cardiac medications before your next appointment, please call your pharmacy.      Signed, Sinclair Grooms, MD  12/13/2015 9:48 AM    [facility] [place], [place], [place]  [address] Phone: [phone]; Fax: [phone]

## 2015-12-13 NOTE — Patient Instructions (Signed)
Medication Instructions:  None  Labwork: None  Testing/Procedures: Your physician has requested that you have an echocardiogram. Echocardiography is a painless test that uses sound waves to create images of your heart. It provides your doctor with information about the size and shape of your heart and how well your heart's chambers and valves are working. This procedure takes approximately one hour. There are no restrictions for this procedure.   Follow-Up: Your physician recommends that you schedule a follow-up appointment as needed with Dr. Smith.   Any Other Special Instructions Will Be Listed Below (If Applicable).     If you need a refill on your cardiac medications before your next appointment, please call your pharmacy.   

## 2016-01-03 ENCOUNTER — Ambulatory Visit (HOSPITAL_COMMUNITY): Payer: Medicare Other | Attending: Cardiology

## 2016-01-03 ENCOUNTER — Other Ambulatory Visit: Payer: Self-pay

## 2016-01-03 DIAGNOSIS — E119 Type 2 diabetes mellitus without complications: Secondary | ICD-10-CM | POA: Diagnosis not present

## 2016-01-03 DIAGNOSIS — I4510 Unspecified right bundle-branch block: Secondary | ICD-10-CM | POA: Diagnosis present

## 2016-01-03 DIAGNOSIS — I071 Rheumatic tricuspid insufficiency: Secondary | ICD-10-CM | POA: Insufficient documentation

## 2016-01-03 DIAGNOSIS — I10 Essential (primary) hypertension: Secondary | ICD-10-CM | POA: Insufficient documentation

## 2016-02-21 NOTE — Progress Notes (Signed)
Received rheumatology lab results. Sent to scan.

## 2016-03-22 ENCOUNTER — Other Ambulatory Visit: Payer: Self-pay | Admitting: Internal Medicine

## 2017-12-21 LAB — HM DEXA SCAN: HM Dexa Scan: NORMAL

## 2019-01-10 ENCOUNTER — Other Ambulatory Visit: Payer: Self-pay | Admitting: Internal Medicine

## 2019-01-10 ENCOUNTER — Ambulatory Visit
Admission: RE | Admit: 2019-01-10 | Discharge: 2019-01-10 | Disposition: A | Discharge status: Home / Self Care | Payer: Medicare Other | Source: Ambulatory Visit | Attending: Internal Medicine | Admitting: Internal Medicine

## 2019-01-10 DIAGNOSIS — R198 Other specified symptoms and signs involving the digestive system and abdomen: Secondary | ICD-10-CM

## 2019-01-10 MED ORDER — IOPAMIDOL (ISOVUE-300) INJECTION 61%
100.0000 mL | Freq: Once | INTRAVENOUS | Status: AC | PRN
  Administered 2019-01-10: 100 mL via INTRAVENOUS

## 2019-01-17 ENCOUNTER — Ambulatory Visit: Payer: Medicare Other | Admitting: Internal Medicine

## 2019-01-17 ENCOUNTER — Other Ambulatory Visit: Payer: Self-pay

## 2019-01-17 ENCOUNTER — Ambulatory Visit (INDEPENDENT_AMBULATORY_CARE_PROVIDER_SITE_OTHER): Payer: Medicare Other

## 2019-01-17 ENCOUNTER — Encounter: Payer: Self-pay | Admitting: Internal Medicine

## 2019-01-17 DIAGNOSIS — J479 Bronchiectasis, uncomplicated: Secondary | ICD-10-CM

## 2019-01-17 NOTE — Progress Notes (Addendum)
Subjective:    Patient ID: Brandy Medina, female    DOB: 07/22/1950    MRN: [id number]    Brief patient profile:  68 yobf quit smoking 1995 eval by [facility] for Bronchiectasis and sinusitis in 2010 but no regular f/u by pulmonary/ ent with flares several per times a year  and always with pnds and referred by Dr Lysle Rubens 07/06/2014 to pulmonary clinic for persistent daily cough since March 2016    History of Present Illness  07/06/2014 1st [facility] office visit/ Brandy Medina   Chief Complaint  Patient presents with  . Pulmonary Consult    Referred by Dr Deforest Hoyles. C/O productive cough with worsening SOB since March 2016. Pt states that mucus is clear in color. Denies fevers or wheezing. Currently on antibiotic d/t PNA on recent CXR   onset of cough  was insidious/ pattern perstitent daily mucus variably tan, sticky never bloody and thicker in am or if turns over in sleep wakes up cough Also sob walking with groceries or flight of steps / advair made it worse /no better with abx and now also sob with activity/ ok breathing at rest as long as not coughing. Presently on levaquin and mucus now clear rec Finish levaquin as you plan  Stop cozar 100  And start diovan 160 mg one daily  Please remember to go to the lab department downstairs for your tests - we will call you with the results when they are available. Prednisone 10 mg take  4 each am x 2 days,   2 each am x 2 days,  1 each am x 2 days and stop For drainage take chlortrimeton (chlorpheniramine) 4 mg every 4 hours available over the counter (may cause drowsiness)  Pantoprazole (protonix) 40 mg   Take  30-60 min before first meal of the day and Pepcid (famotidine)  20 mg one @  bedtime   GERD diet Try over the counter delsym 2 tsp every 12 hours as needed for cough      08/04/2014 f/u ov/Brandy Medina re: bronchiectasis  Chief Complaint  Patient presents with  . Follow-up    Pt states that her cough is much improved.  When she does cough it is prod  with minimal tan sputum. She states she is having constant PND. Her breathing has improved.   Not limited by breathing from desired activities  / sense of  pnds is day > noct with urge to clear throat disprorportionate to mucus production which is minimal. rec Please remember to go to the  x-ray department downstairs for your tests - we will call you with the results when they are available. Bronchiectasis =   you have scarring of your bronchial tubes  Whenever you develop cough congestion take mucinex or mucinex dm (don't take delsym with it)     09/19/2014 f/u ov/Brandy Medina re: obst bronchiectasis with complete nl airflow p 27% resp to saba  Chief Complaint  Patient presents with  . Follow-up    PFT done today. Pt states her breathing has improved and her cough is also better. No new co's today.    still not able to sing like she used to, but cough is better. Not limited by breathing from desired activities but not aerobically active rec Stop pantoprazole and stay on pepcid at bedside dulera 100  Take 2 puffs first thing in am and then another 2 puffs about 12 hours later. Work on inhaler technique:  relax and gently blow all the way  out then take a nice smooth deep breath back in, triggering the inhaler at same time you start breathing in.  Hold for up to 5 seconds if you can. Blow out thru nose. Rinse and gargle with water when done   10/02/2015  f/u ov/Brandy Medina re: obstructive bronchiectasis  Chief Complaint  Patient presents with  . Follow-up    Breathing is "fine". She c/o PND "always" and prod cough with green sputum.    [facility] eval neg  Sense of drainage is worse at hs at least 3 noct per week / flonase hs no better  No longer on pepcid at all/ intermittently using ppi due to concerns with renal fnx Not limited by breathing from desired activities  - even climbing steps  rec Pneumoccal and flu vaccines today  Pantoprazole (protonix) 40 mg   Take  30-60 min before first meal of the day and  Pepcid (famotidine)  20 mg one @  bedtime until better enough then stop the pantoprazole and take the pepcid 20 mg twice daily (after bfast and one hour before bedtime For drainage / throat tickle try take CHLORPHENIRAMINE  4 mg - take one every 4 hours as needed - available over the counter- may cause drowsiness so start with just a bedtime dose or two and see how you tolerate it before trying in daytime      01/17/2019  f/u ov/Brandy Medina re: obstructive bronchiectasis / sjorgren's per [facility] Am[facility]  - no longer on dulera/gerd rx Chief Complaint  Patient presents with  . Cough    Patient reports that she has a productive cough with tan colored sputum and tightness in her back.   Dyspnea:  MMRC1 = can walk nl pace, flat grade, can't hurry or go uphills or steps s sob   Cough:  Better than it used to be/ p stirring produces a little mucus variably beige < 1 tsp  Sleeping: able to sleep one pillow/ sometimes nose drains down one side or other not using any rx happens several times a week  SABA use: none 02: none  Has new medication from [facility] but hasn't tried it yet but overall doesn't seem happy with nasal symptoms chronically and already tried 1st gen H1 blockers per guidelines  And flonase and saline "dries me out"   No obvious day to day or daytime variability or assoc  mucus plugs or hemoptysis or cp or chest tightness, subjective wheeze or overt sinus or hb symptoms.     Also denies any obvious fluctuation of symptoms with weather or environmental changes or other aggravating or alleviating factors except as outlined above   No unusual exposure hx or h/o childhood pna/ asthma or knowledge of premature birth.  Current Allergies, Complete Past Medical History, Past Surgical History, Family History, and Social History were reviewed in [employer] record.  ROS  The following are not active complaints unless bolded Hoarseness, sore throat, dysphagia, dental problems, itching,  sneezing,  nasal congestion or discharge of excess mucus or purulent secretions, ear ache,   fever, chills, sweats, unintended wt loss or wt gain, classically pleuritic or exertional cp,  orthopnea pnd or arm/hand swelling  or leg swelling, presyncope, palpitations, abdominal pain, anorexia, nausea, vomiting, diarrhea  or change in bowel habits= constipation/ seeing GI or change in bladder habits, change in stools or change in urine, dysuria, hematuria,  rash, arthralgias, visual complaints, headache, numbness, weakness or ataxia or problems with walking or coordination,  change in mood or  memory.        Current Meds  Medication Sig  . acetaminophen (TYLENOL) 325 MG tablet Take 650 mg by mouth every 6 (six) hours as needed for headache.  . butalbital-acetaminophen-caffeine (FIORICET/CODEINE) 50-325-40-30 MG capsule Take 1 capsule by mouth every 8 (eight) hours as needed for headache.  . chlorpheniramine (CHLOR-TRIMETON) 4 MG tablet 1 at bedtime and 1 every 4 hours as needed during the day  . desmopressin (DDAVP) 0.01 % solution Place 10 mcg into the nose as directed. Instill 0.05 drop in nostril once a day at time of procedures/surgery  . dextromethorphan (DELSYM) 30 MG/5ML liquid 2 every 12 hours as needed  . Ergocalciferol (VITAMIN D2) 2000 units TABS Take 1 tablet by mouth 2 (two) times a week.  . famotidine (PEPCID) 20 MG tablet Take 20 mg by mouth 3 times/day as needed-between meals & bedtime for heartburn or indigestion.  . fluticasone (FLONASE) 50 MCG/ACT nasal spray Place 2 sprays into both nostrils daily as needed for allergies or rhinitis.  . hydroxychloroquine (PLAQUENIL) 200 MG tablet Take 400 mg by mouth daily.  . metFORMIN (GLUCOPHAGE) 500 MG tablet Take 500 mg by mouth daily with breakfast.   . pantoprazole (PROTONIX) 40 MG tablet take 1 tablet by mouth once daily  30 TO 60 MINUTES NEFORE THE FIRST MEAL OF THE DAY  . Respiratory Therapy Supplies (FLUTTER) DEVI Use as directed  .  triamterene-hydrochlorothiazide (MAXZIDE-25) 37.5-25 MG per tablet Take 1 tablet by mouth daily.   . valsartan (DIOVAN) 160 MG tablet take 1 tablet by mouth once daily                Objective:   Physical Exam  amb bf nad  Vital signs reviewed - Note on arrival 02 sats  99% on RA    01/17/2019      172 10/02/2015          179  08/04/2014          182 >   09/19/2014 179 > 12/25/2014  178     07/06/14 186 lb 3.2 oz (84.46 kg)  06/20/09 206 lb (93.441 kg)  10/17/08 214 lb 6.1 oz (97.243 kg)      HEENT : pt wearing mask not removed for exam due to covid -19 concerns.    NECK :  without JVD/Nodes/TM/ nl carotid upstrokes bilaterally   LUNGS: no acc muscle use,  Nl contour chest which is clear to A and P bilaterally without cough on insp or exp maneuvers   CV:  RRR  no s3 or murmur or increase in P2, and no edema   ABD:  soft and nontender with nl inspiratory excursion in the supine position. No bruits or organomegaly appreciated, bowel sounds nl  MS:  Nl gait/ ext warm without deformities, calf tenderness, cyanosis or clubbing No obvious joint restrictions   SKIN: warm and dry without lesions    NEURO:  alert, approp, nl sensorium with  no motor or cerebellar deficits apparent.    CXR PA and Lateral:   01/17/2019 :    I personally reviewed images and impression as follows:   incrased markings right mid/right base s consolidation, same  since last cxr 08/04/14       I personally reviewed images and agree with radiology impression as follows:  Chest CT cuts on abd ct 01/10/2019 Bronchiectasis with extensive cystic changes within the bilateral lung bases, significantly progressed from prior chest CT 01/15/2009. Small hiatal hernia.  Assessment & Plan:

## 2019-01-17 NOTE — Patient Instructions (Addendum)
Please remember to go to the  x-ray department  for your tests - we will call you with the results when they are available     Please schedule a follow up office visit in 6  months call sooner if needed - with pfts on return

## 2019-01-18 ENCOUNTER — Telehealth: Payer: Self-pay | Admitting: Internal Medicine

## 2019-01-18 ENCOUNTER — Encounter: Payer: Self-pay | Admitting: Internal Medicine

## 2019-01-18 NOTE — Telephone Encounter (Signed)
Call pt: Reviewed cxr and no acute change so no change in recommendations made at [facility] and spoke with pt letting her know the results of the cxr and she verbalized understanding.nothing further needed.

## 2019-01-18 NOTE — Assessment & Plan Note (Signed)
CT 01/15/09 Stable bronchiectasis and cystic changes throughout both lungs, most extensive in the right upper lobe, since the prior examination from June, 2010. 2. Ground-glass opacities in the lower lobes, improved since the prior examination. This is consistent with improved but residual mild acute inflammatory changes. - alpha one AT 07/06/14 >>  MM  - flutter valve added 08/04/14  - PFT's  09/19/2014  FEV1 2.09 (103 % ) ratio 72  p 27 % improvement from saba - 09/19/2014   90% >> dulera 100  2bid trial  - 12/25/2014  extensive coaching HFA effectiveness =    75% from a baseline of < 50%  - sinus CT 12/25/2014 >Negative paranasal sinuses.   Assoc with sjorgrens syndrome and doing surprisingly well x for ongoing nasal complaints for which she is also seeing Dr Benjamine Mola so I have no additional pulmonary recs at this point other than follow Teoh's instructions and give him direct feedback if not happy with response and refer to tertiary center prn    Pt informed of the seriousness of COVID 19 infection as a direct risk to their health  and safey and to those of their loved ones and should continue to wear facemask in public and minimize exposure to public locations but especially avoid any area or activity where non-close contacts are not observing distancing or wearing an appropriate face mask > strongly rec vaccine w/a   >>> f/u in 6 m with pfts (delay f/u to reduce covid 19 exp)    I had an extended discussion with the patient reviewing all relevant studies completed to date and  lasting 15 to 20 minutes of a 25 minute visit    Each maintenance medication was reviewed in detail including most importantly the difference between maintenance and prns and under what circumstances the prns are to be triggered using an action plan format that is not reflected in the computer generated alphabetically organized AVS.     Please see AVS for specific instructions unique to this visit that I personally  wrote and verbalized to the the pt in detail and then reviewed with pt  by my nurse highlighting any  changes in therapy recommended at today's visit to their plan of care.

## 2019-02-18 ENCOUNTER — Other Ambulatory Visit: Payer: Self-pay | Admitting: Internal Medicine

## 2019-02-18 DIAGNOSIS — J3489 Other specified disorders of nose and nasal sinuses: Secondary | ICD-10-CM

## 2019-02-25 ENCOUNTER — Other Ambulatory Visit: Payer: Medicare Other

## 2019-02-25 ENCOUNTER — Ambulatory Visit
Admission: RE | Admit: 2019-02-25 | Discharge: 2019-02-25 | Disposition: A | Discharge status: Home / Self Care | Payer: Medicare PPO | Source: Ambulatory Visit | Attending: Internal Medicine | Admitting: Internal Medicine

## 2019-02-25 DIAGNOSIS — J3489 Other specified disorders of nose and nasal sinuses: Secondary | ICD-10-CM

## 2019-03-05 ENCOUNTER — Ambulatory Visit: Payer: Medicare PPO | Attending: Internal Medicine

## 2019-03-07 ENCOUNTER — Ambulatory Visit: Payer: Medicare PPO | Attending: Internal Medicine

## 2019-03-07 DIAGNOSIS — Z23 Encounter for immunization: Secondary | ICD-10-CM | POA: Insufficient documentation

## 2019-03-07 NOTE — Progress Notes (Signed)
   Covid-19 Vaccination Clinic  Name:  Brandy Medina    MRN: [id number] DOB: Sep 01, 1950  03/07/2019  [name] was observed post Covid-19 immunization for 15 minutes without incidence. She was provided with Vaccine Information Sheet and instruction to access the V-Safe system.   [name] was instructed to call 911 with any severe reactions post vaccine: Marland Kitchen Difficulty breathing  . Swelling of your face and throat  . A fast heartbeat  . A bad rash all over your body  . Dizziness and weakness    Immunizations Administered    Name Date Dose VIS Date Route   Pfizer COVID-19 Vaccine 03/07/2019  2:23 PM 0.3 mL 01/07/2019 Intramuscular   Manufacturer: [place]   Lot: [id number]   [place]: [id number]

## 2019-03-23 ENCOUNTER — Ambulatory Visit: Payer: Medicare Other

## 2019-04-01 ENCOUNTER — Ambulatory Visit: Payer: Medicare PPO | Attending: Internal Medicine

## 2019-04-01 DIAGNOSIS — Z23 Encounter for immunization: Secondary | ICD-10-CM

## 2019-04-01 NOTE — Progress Notes (Signed)
   Covid-19 Vaccination Clinic  Name:  Brandy Medina    MRN: [id number] DOB: [birth year]  04/01/2019  Brandy Medina was observed post Covid-19 immunization for 15 minutes without incident. She was provided with Vaccine Information Sheet and instruction to access the V-Safe system.   Brandy Medina was instructed to call 911 with any severe reactions post vaccine: Marland Kitchen Difficulty breathing  . Swelling of face and throat  . A fast heartbeat  . A bad rash all over body  . Dizziness and weakness   Immunizations Administered    Name Date Dose VIS Date Route   Pfizer COVID-19 Vaccine 04/01/2019  8:53 AM 0.3 mL 01/07/2019 Intramuscular   Manufacturer: [place]   Lot: [id number]   [place]: [id number]

## 2019-05-03 DIAGNOSIS — J849 Interstitial pulmonary disease, unspecified: Secondary | ICD-10-CM | POA: Diagnosis not present

## 2019-05-03 DIAGNOSIS — E663 Overweight: Secondary | ICD-10-CM | POA: Diagnosis not present

## 2019-05-03 DIAGNOSIS — Z6828 Body mass index (BMI) 28.0-28.9, adult: Secondary | ICD-10-CM | POA: Diagnosis not present

## 2019-05-03 DIAGNOSIS — M2550 Pain in unspecified joint: Secondary | ICD-10-CM | POA: Diagnosis not present

## 2019-05-03 DIAGNOSIS — M3500 Sicca syndrome, unspecified: Secondary | ICD-10-CM | POA: Diagnosis not present

## 2019-05-03 DIAGNOSIS — D638 Anemia in other chronic diseases classified elsewhere: Secondary | ICD-10-CM | POA: Diagnosis not present

## 2019-05-03 DIAGNOSIS — D890 Polyclonal hypergammaglobulinemia: Secondary | ICD-10-CM | POA: Diagnosis not present

## 2019-05-23 DIAGNOSIS — R682 Dry mouth, unspecified: Secondary | ICD-10-CM | POA: Diagnosis not present

## 2019-05-23 DIAGNOSIS — K119 Disease of salivary gland, unspecified: Secondary | ICD-10-CM | POA: Diagnosis not present

## 2019-06-25 ENCOUNTER — Other Ambulatory Visit (HOSPITAL_COMMUNITY)
Admission: RE | Admit: 2019-06-25 | Discharge: 2019-06-25 | Disposition: A | Discharge status: Home / Self Care | Payer: Medicare PPO | Source: Ambulatory Visit | Attending: Internal Medicine | Admitting: Internal Medicine

## 2019-06-25 DIAGNOSIS — Z20822 Contact with and (suspected) exposure to covid-19: Secondary | ICD-10-CM | POA: Diagnosis not present

## 2019-06-25 DIAGNOSIS — Z01812 Encounter for preprocedural laboratory examination: Secondary | ICD-10-CM | POA: Insufficient documentation

## 2019-06-26 LAB — SARS CORONAVIRUS 2 (TAT 6-24 HRS): SARS Coronavirus 2: NEGATIVE

## 2019-06-28 NOTE — Progress Notes (Signed)
@Patient  ID: Brandy Medina, female    DOB: 10/04/1950, 69 y.o.   MRN: [id number]  Chief Complaint  Patient presents with  . Follow-up    PFT today-sob-same, pnd,cough occass. thick clear    Referring provider: Wenda Low, MD  HPI:  69 year old female former smoker followed in our office for upper airway cough syndrome  PMH: Sinusitis, bronchiectasis, GERD, hypertension, polyclonal gammopathy determined by serum protein electrophoresis Smoker/ Smoking History: Former smoker. Quit 1995. 9 pack year smoker.  Maintenance:  none Pt of: Dr. Melvyn Novas  06/29/2019  - Visit   69 year old female former smoker followed in our office for upper airway cough syndrome.  She is followed by Dr. Melvyn Novas.  She was initially seen by Dr. Lamonte Sakai in our practice in 2010 for bronchiectasis and sinusitis.  She was referred back to our office in 2016 and establish care with Dr. Melvyn Novas.  At last office visit in December/2020 it was recommended that she follow-up with our office in 6 months with a pulmonary function test.  She is presenting today after completing this.  Pulmonary function tests results listed below:  06/29/2019-pulmonary function test-FVC 2.94 (118% predicted), postbronchodilator ratio 52, postbronchodilator FEV1 1.47 (76% predicted), no bronchodilator response, DLCO 12.61 (63% predicted >>>Patient struggled with instructions during PFT per PFT tech, unlikely these PFTs are accurate, unable to provide a formal read  Patient followed in our office for bronchiectasis.  She is without a flutter valve.  She reports she has been without 1 for almost 18 months.  She would like to get a new one today.  She has a history of chronic sinusitis and is followed by ear nose and throat Dr. Benjamine Mola.  Overall she reports that her shortness of breath and dyspnea has improved significantly.  She feels that she is at her baseline with her allergies.  She does not report any recent pneumonias, antibiotics or steroid  use.   Questionaires / Pulmonary Flowsheets:   ACT:  No flowsheet data found.  MMRC: No flowsheet data found.  Epworth:  No flowsheet data found.  Tests:   06/25/2019-SARS-CoV-2-negative  02/25/2019-CT maxillofacial-redemonstrated hypoplastic frontal and sphenoid sinuses, sites of minimal and trace mucosal thickening with ethmoid air cells, left sphenoid sinus and left maxillary sinus, the paranasal sinuses are overall well aerated, patent sinus drainage pathways, mild leftward deviation of bony nasal septum  01/15/2009-CT chest without contrast-stable bronchiectasis and cystic changes throughout, most extensive in the right upper lobe since prior examination from June/2010, groundglass opacities in lower lobes, improved since prior examination, consistent with improved but residual mild acute inflammatory changes  01/03/2016-echocardiogram-LV ejection fraction 60 to [id number], grade 1 diastolic dysfunction, PA peak pressure 30  09/19/2014-pulmonary function testing-FVC 2.94 (113% predicted), postbronchodilator ratio 72, postbronchodilator FEV1 2.09 (103% predicted), positive bronchodilator response in FEV1 and mid flows,  FENO:  No results found for: NITRICOXIDE  PFT: PFT Results Latest Ref Rng & Units 06/29/2019 09/19/2014  FVC-Pre L 2.94 2.94  FVC-Predicted Pre % 118 113  FVC-Post L 2.82 2.92  FVC-Predicted Post % 113 112  Pre FEV1/FVC % % 72 56  Post FEV1/FCV % % 52 72  FEV1-Pre L 2.12 1.65  FEV1-Predicted Pre % 109 81  FEV1-Post L 1.47 2.09  DLCO UNC% % 63 -  DLCO COR %Predicted % 78 -  TLC L 4.98 4.89  TLC % Predicted % 97 95  RV % Predicted % 95 99    WALK:  No flowsheet data found.  Imaging: No results found.  Lab Results:  CBC    Component Value Date/Time   WBC 4.2 11/20/2015 1608   WBC 4.4 07/06/2014 1614   RBC 3.00 (L) 11/20/2015 1608   RBC 3.11 (L) 07/06/2014 1614   HGB 9.5 (L) 11/20/2015 1608   HCT 27.7 (L) 11/20/2015 1608   PLT 249 11/20/2015 1608    MCV 92.3 11/20/2015 1608   MCH 31.7 11/20/2015 1608   MCHC 34.3 11/20/2015 1608   MCHC 33.6 07/06/2014 1614   RDW 14.3 11/20/2015 1608   LYMPHSABS 1.0 11/20/2015 1608   MONOABS 0.4 11/20/2015 1608   EOSABS 0.0 11/20/2015 1608   BASOSABS 0.0 11/20/2015 1608    BMET    Component Value Date/Time   NA 133 (L) 11/20/2015 1608   K 4.2 11/20/2015 1608   CO2 24 11/20/2015 1608   GLUCOSE 90 11/20/2015 1608   BUN 25.3 11/20/2015 1608   CREATININE 1.0 11/20/2015 1608   CALCIUM 10.5 (H) 11/20/2015 1608    BNP No results found for: BNP  ProBNP No results found for: PROBNP  Specialty Problems      Pulmonary Problems   Sinusitis, chronic    Qualifier: Diagnosis of  By: Quentin Cornwall CMA, Jessica        Obstructive bronchiectasis ([place])    CT 01/15/09 Stable bronchiectasis and cystic changes throughout both lungs, most extensive in the right upper lobe, since the prior examination from June, 2010. 2. Ground-glass opacities in the lower lobes, improved since the prior examination. This is consistent with improved but residual mild acute inflammatory changes. - alpha one AT 07/06/14 >>  MM  - flutter valve added 08/04/14  - PFT's  09/19/2014  FEV1 2.09 (103 % ) ratio 72  p 27 % improvement from saba - 09/19/2014   90% >> dulera 100  2bid trial  - 12/25/2014  extensive coaching HFA effectiveness =    75% from a baseline of < 50%  - sinus CT 12/25/2014 >Negative paranasal sinuses.         Upper airway cough syndrome    Followed in Pulmonary clinic/ [facility]/ [place]  - allergy profile 07/06/2014 > Eos 0,  IgE 16  Pos RAST dust only  - improved 09/19/2014 with gerd rx > try just pepcid at hs  - Teoh eval neg 2017           Allergies  Allergen Reactions  . Aspirin   . Avelox [Moxifloxacin Hcl In Nacl] Other (See Comments)    confusion  . Codeine     REACTION: nausea  . Erythromycin     GI upset  . Lisinopril     REACTION: Headache  . Sulfonamide Derivatives      REACTION: headache    Immunization History  Administered Date(s) Administered  . H1N1 03/27/2008  . Influenza Split 09/28/2014  . Influenza, High Dose Seasonal PF 10/29/2018  . Influenza,inj,Quad PF,6+ Mos 10/02/2015  . PFIZER SARS-COV-2 Vaccination 03/07/2019, 04/01/2019  . Pneumococcal Conjugate-13 09/19/2014  . Pneumococcal Polysaccharide-23 10/02/2015  . Zoster Recombinat (Shingrix) 03/18/2018, 07/15/2018    Past Medical History:  Diagnosis Date  . Allergic rhinitis   . Diabetes ([place])   . HTN (hypertension)   . Migraines     Tobacco History: Social History   Tobacco Use  Smoking Status Former Smoker  . Packs/day: 0.50  . Years: 18.00  . Pack years: 9.00  . Types: Cigarettes  . Quit date: 07/05/1993  . Years since quitting: 26.0  Smokeless Tobacco Never Used  Counseling given: Yes   Continue to not smoke  Outpatient Encounter Medications as of 06/29/2019  Medication Sig  . acetaminophen (TYLENOL) 325 MG tablet Take 650 mg by mouth every 6 (six) hours as needed for headache.  . butalbital-acetaminophen-caffeine (FIORICET/CODEINE) 50-325-40-30 MG capsule Take 1 capsule by mouth every 8 (eight) hours as needed for headache.  . chlorpheniramine (CHLOR-TRIMETON) 4 MG tablet 1 at bedtime and 1 every 4 hours as needed during the day  . Ergocalciferol (VITAMIN D2) 2000 units TABS Take 1 tablet by mouth 2 (two) times a week.  . hydroxychloroquine (PLAQUENIL) 200 MG tablet Take 400 mg by mouth daily.  . metFORMIN (GLUCOPHAGE) 500 MG tablet Take 500 mg by mouth daily with breakfast.   . triamterene-hydrochlorothiazide (MAXZIDE-25) 37.5-25 MG per tablet Take 1 tablet by mouth daily. Taking 1/2 tablet daily  . valsartan (DIOVAN) 160 MG tablet take 1 tablet by mouth once daily  . desmopressin (DDAVP) 0.01 % solution Place 10 mcg into the nose as directed. Instill 0.05 drop in nostril once a day at time of procedures/surgery  . dextromethorphan (DELSYM) 30 MG/5ML liquid 2 every  12 hours as needed  . fluticasone (FLONASE) 50 MCG/ACT nasal spray Place 2 sprays into both nostrils daily as needed for allergies or rhinitis.  Marland Kitchen Respiratory Therapy Supplies (FLUTTER) DEVI Use as directed (Patient not taking: Reported on 06/29/2019)   No facility-administered encounter medications on file as of 06/29/2019.     Review of Systems  Review of Systems  Constitutional: Negative for activity change, fatigue and fever.  HENT: Positive for congestion, postnasal drip and rhinorrhea. Negative for sinus pressure, sinus pain and sore throat.   Respiratory: Positive for cough. Negative for shortness of breath and wheezing.   Cardiovascular: Negative for chest pain and palpitations.  Gastrointestinal: Negative for diarrhea, nausea and vomiting.  Musculoskeletal: Negative for arthralgias.  Neurological: Negative for dizziness.  Psychiatric/Behavioral: Negative for sleep disturbance. The patient is not nervous/anxious.      Physical Exam  BP 110/64 (BP Location: Left Arm, Cuff Size: Normal)   Pulse 71   Temp 98.7 F (37.1 C) (Oral)   Ht 5' 4.5" (1.638 m)   Wt 173 lb 6.4 oz (78.7 kg)   SpO2 99%   BMI 29.30 kg/m   Wt Readings from Last 5 Encounters:  06/29/19 173 lb 6.4 oz (78.7 kg)  01/17/19 172 lb 9.6 oz (78.3 kg)  12/13/15 176 lb 6.4 oz (80 kg)  11/27/15 181 lb 3.2 oz (82.2 kg)  11/20/15 176 lb 14.4 oz (80.2 kg)    BMI Readings from Last 5 Encounters:  06/29/19 29.30 kg/m  01/17/19 29.17 kg/m  12/13/15 30.28 kg/m  11/27/15 30.86 kg/m  11/20/15 30.13 kg/m     Physical Exam Vitals and nursing note reviewed.  Constitutional:      General: She is not in acute distress.    Appearance: Normal appearance. She is normal weight.  HENT:     Head: Normocephalic and atraumatic.     Right Ear: External ear normal.     Left Ear: External ear normal.     Nose:     Comments: Deferred due to masking requirement    Mouth/Throat:     Comments: Deferred due to masking  requirement Eyes:     Pupils: Pupils are equal, round, and reactive to light.  Cardiovascular:     Rate and Rhythm: Normal rate and regular rhythm.     Pulses: Normal pulses.     Heart  sounds: Normal heart sounds. No murmur.  Pulmonary:     Effort: Pulmonary effort is normal. No respiratory distress.     Breath sounds: Normal breath sounds. No decreased air movement. No decreased breath sounds, wheezing or rales.  Musculoskeletal:     Cervical back: Normal range of motion.  Skin:    General: Skin is warm and dry.     Capillary Refill: Capillary refill takes less than 2 seconds.  Neurological:     General: No focal deficit present.     Mental Status: She is alert and oriented to person, place, and time. Mental status is at baseline.     Gait: Gait normal.  Psychiatric:        Mood and Affect: Mood normal.        Behavior: Behavior normal.        Thought Content: Thought content normal.        Judgment: Judgment normal.       Assessment & Plan:   Sinusitis, chronic Plan: Continue follow-up with ENT Follow-up with our office when you remember what your new nasal medication is, so we can update our records  Obstructive bronchiectasis ([place]) History of bronchiectasis Previously followed by Dr. Lamonte Sakai.  Not currently followed by Dr. Melvyn Novas Patient needs any flutter valve Patient with extensive history of chronic sinusitis followed by ENT  Plan: Restart flutter valve use today Per patient request we will get her back established with Dr. Lamonte Sakai in a 30-minute time slot in 3 months Explained to patient to notify our office if she has any worsened symptoms of fatigue, fevers, discolored sputum, increased sputum production    Return in about 3 months (around 09/29/2019), or if symptoms worsen or fail to improve, for Follow up with Dr. Lamonte Sakai, [place].   Lauraine Rinne, NP 06/29/2019   This appointment required 25 minutes of patient care (this includes precharting, chart  review, review of results, face-to-face care, etc.).

## 2019-06-29 ENCOUNTER — Ambulatory Visit: Payer: Medicare PPO | Admitting: Pulmonary Disease

## 2019-06-29 ENCOUNTER — Other Ambulatory Visit: Payer: Self-pay

## 2019-06-29 ENCOUNTER — Encounter: Payer: Self-pay | Admitting: Pulmonary Disease

## 2019-06-29 ENCOUNTER — Telehealth: Payer: Self-pay | Admitting: Pulmonary Disease

## 2019-06-29 ENCOUNTER — Ambulatory Visit (INDEPENDENT_AMBULATORY_CARE_PROVIDER_SITE_OTHER): Payer: Medicare PPO | Admitting: Internal Medicine

## 2019-06-29 ENCOUNTER — Ambulatory Visit: Payer: Medicare PPO | Admitting: Internal Medicine

## 2019-06-29 VITALS — BP 110/64 | HR 71 | Temp 98.7°F | Ht 64.5 in | Wt 173.4 lb

## 2019-06-29 DIAGNOSIS — J329 Chronic sinusitis, unspecified: Secondary | ICD-10-CM | POA: Diagnosis not present

## 2019-06-29 DIAGNOSIS — J479 Bronchiectasis, uncomplicated: Secondary | ICD-10-CM

## 2019-06-29 LAB — PULMONARY FUNCTION TEST
DL/VA % pred: 78 %
DL/VA: 3.26 ml/min/mmHg/L
DLCO unc % pred: 63 %
DLCO unc: 12.61 ml/min/mmHg
FEF 25-75 Post: 0.99 L/sec
FEF 25-75 Pre: 1.73 L/sec
FEF2575-%Change-Post: -42 %
FEF2575-%Pred-Post: 55 %
FEF2575-%Pred-Pre: 95 %
FEV1-%Change-Post: -30 %
FEV1-%Pred-Post: 76 %
FEV1-%Pred-Pre: 109 %
FEV1-Post: 1.47 L
FEV1-Pre: 2.12 L
FEV1FVC-%Change-Post: -27 %
FEV1FVC-%Pred-Pre: 92 %
FEV6-%Change-Post: 0 %
FEV6-%Pred-Post: 116 %
FEV6-%Pred-Pre: 117 %
FEV6-Post: 2.8 L
FEV6-Pre: 2.81 L
FEV6FVC-%Pred-Post: 103 %
FEV6FVC-%Pred-Pre: 103 %
FVC-%Change-Post: -4 %
FVC-%Pred-Post: 113 %
FVC-%Pred-Pre: 118 %
FVC-Post: 2.82 L
FVC-Pre: 2.94 L
Post FEV1/FVC ratio: 52 %
Post FEV6/FVC ratio: 100 %
Pre FEV1/FVC ratio: 72 %
Pre FEV6/FVC Ratio: 100 %
RV % pred: 95 %
RV: 2.07 L
TLC % pred: 97 %
TLC: 4.98 L

## 2019-06-29 MED ORDER — FLUTTER DEVI: 0 refills | Status: AC

## 2019-06-29 NOTE — Telephone Encounter (Signed)
06/29/2019  Saw patient in clinic today.  Stable follow-up.  Previously seen by Dr. Lamonte Sakai in 2010.  Now followed by Dr. Melvyn Novas.  Patient would like to reestablish with Dr. Lamonte Sakai.  Send a message to Dr. Lamonte Sakai and Dr. Melvyn Novas per office protocol.  Please let us know if you approve of patient switching providers.  Wyn Quaker, FNP

## 2019-06-29 NOTE — Assessment & Plan Note (Signed)
History of bronchiectasis Previously followed by Dr. Lamonte Sakai.  Not currently followed by Dr. Melvyn Novas Patient needs any flutter valve Patient with extensive history of chronic sinusitis followed by ENT  Plan: Restart flutter valve use today Per patient request we will get her back established with Dr. Lamonte Sakai in a 30-minute time slot in 3 months Explained to patient to notify our office if she has any worsened symptoms of fatigue, fevers, discolored sputum, increased sputum production

## 2019-06-29 NOTE — Telephone Encounter (Signed)
Forwarding to [facility] for his approval

## 2019-06-29 NOTE — Telephone Encounter (Signed)
Fine with me

## 2019-06-29 NOTE — Patient Instructions (Addendum)
You were seen today by Lauraine Rinne, NP  for:   1. Obstructive bronchiectasis (HCC)  Bronchiectasis: This is the medical term which indicates that you have damage, dilated airways making you more susceptible to respiratory infection. Use a flutter valve 10 breaths twice a day or 4 to 5 breaths 4-5 times a day to help clear mucus out Let us know if you have cough with change in mucus color or fevers or chills.  At that point you would need an antibiotic. Maintain a healthy nutritious diet, eating whole foods Take your medications as prescribed    2. Chronic sinusitis, unspecified location  Continue follow-up with ENT Dr. Benjamine Mola  Please notify our office when you get home of what your new nasal medication is  Follow Up:    Return in about 3 months (around 09/29/2019), or if symptoms worsen or fail to improve, for Follow up with Dr. Lamonte Sakai, [place].   Please do your part to reduce the spread of COVID-19:      Reduce your risk of any infection  and COVID19 by using the similar precautions used for avoiding the common cold or flu:  Marland Kitchen Wash your hands often with soap and warm water for at least 20 seconds.  If soap and water are not readily available, use an alcohol-based hand sanitizer with at least 60% alcohol.  . If coughing or sneezing, cover your mouth and nose by coughing or sneezing into the elbow areas of your shirt or coat, into a tissue or into your sleeve (not your hands). Langley Gauss A MASK when in public  . Avoid shaking hands with others and consider head nods or verbal greetings only. . Avoid touching your eyes, nose, or mouth with unwashed hands.  . Avoid close contact with people who are sick. . Avoid places or events with large numbers of people in one location, like concerts or sporting events. . If you have some symptoms but not all symptoms, continue to monitor at home and seek medical attention if your symptoms worsen. . If you are having a medical emergency, call  911.   [place] / e-Visit: [email or web address]         [facility]: [facility]: [id number]                   [facility]: [id number]     It is flu season:   >>> Best ways to protect herself from the flu: Receive the yearly flu vaccine, practice good hand hygiene washing with soap and also using hand sanitizer when available, eat a nutritious meals, get adequate rest, hydrate appropriately   Please contact the office if your symptoms worsen or you have concerns that you are not improving.   Thank you for choosing [facility] for your healthcare, and for allowing [place] to partner with you on your healthcare journey. I am thankful to be able to provide care to you today.   Wyn Quaker FNP-C    Bronchiectasis  Bronchiectasis is a condition in which the airways in the lungs (bronchi) are damaged and widened. The condition makes it hard for the lungs to get rid of mucus, and it causes mucus to gather in the bronchi. This condition often leads to lung infections, which can make the condition worse. What are the causes? You can be born with this condition or you can develop it later  in life. Common causes of this condition include:  Cystic fibrosis.  Repeated lung infections, such as pneumonia or tuberculosis.  An object or other blockage in the lungs.  Breathing in fluid, food, or other objects (aspiration).  A problem with the immune system and lung structure that is present at birth (congenital). Sometimes the cause is not known. What are the signs or symptoms? Common symptoms of this condition include:  A daily cough that brings up mucus and lasts for more than 3 weeks.  Lung infections that happen often.  Shortness of breath and wheezing.  Weakness and fatigue. How is this diagnosed? This condition is  diagnosed with tests, such as:  Chest X-rays or CT scans. These are done to check for changes in the lungs.  Breathing tests. These are done to check how well your lungs are working.  A test of a sample of your saliva (sputum culture). This test is done to check for infection.  Blood tests and other tests. These are done to check for related diseases or causes. How is this treated? Treatment for this condition depends on the severity of the illness and its cause. Treatment may include:  Medicines that loosen mucus so it can be coughed up (expectorants).  Medicines that relax the muscles of the bronchi (bronchodilators).  Antibiotic medicines to prevent or treat infection.  Physical therapy to help clear mucus from the lungs. Techniques may include: ? Postural drainage. This is when you sit or lie in certain positions so that mucus can drain by gravity. ? Chest percussion. This involves tapping the chest or back with a cupped hand. ? Chest vibration. For this therapy, a hand or special equipment vibrates your chest and back.  Surgery to remove the affected part of the lung. This may be done in severe cases. Follow these instructions at home: Medicines  Take over-the-counter and prescription medicines only as told by your health care provider.  If you were prescribed an antibiotic medicine, take it as told by your health care provider. Do not stop taking the antibiotic even if you start to feel better.  Avoid taking sedatives and antihistamines unless your health care provider tells you to take them. These medicines tend to thicken the mucus in the lungs. Managing symptoms  Perform breathing exercises or techniques to clear your lungs as told by your health care provider.  Consider using a cold steam vaporizer or humidifier in your room or home to help loosen secretions.  If you have a cough that gets worse at night, try sleeping in a semi-upright position. General  instructions  Get plenty of rest.  Drink enough fluid to keep your urine clear or pale yellow.  Stay inside when pollution and ozone levels are high.  Stay up to date with vaccinations and immunizations.  Avoid cigarette smoke and other lung irritants.  Do not use any products that contain nicotine or tobacco, such as cigarettes and e-cigarettes. If you need help quitting, ask your health care provider.  Keep all follow-up visits as told by your health care provider. This is important. Contact a health care provider if:  You cough up more sputum than before and the sputum is yellow or green in color.  You have a fever.  You cannot control your cough and are losing sleep. Get help right away if:  You cough up blood.  You have chest pain.  You have increasing shortness of breath.  You have pain that gets worse or is not  controlled with medicines.  You have a fever and your symptoms suddenly get worse. Summary  Bronchiectasis is a condition in which the airways in the lungs (bronchi) are damaged and widened. The condition makes it hard for the lungs to get rid of mucus, and it causes mucus to gather in the bronchi.  Treatment usually includes therapy to help clear mucus from the lungs.  Stay up to date with vaccinations and immunizations. This information is not intended to replace advice given to you by your health care provider. Make sure you discuss any questions you have with your health care provider. Document Revised: 12/26/2016 Document Reviewed: 02/18/2016 Elsevier Patient Education  2020 [employer].

## 2019-06-29 NOTE — Assessment & Plan Note (Signed)
Plan: Continue follow-up with ENT Follow-up with our office when you remember what your new nasal medication is, so we can update our records

## 2019-06-29 NOTE — Addendum Note (Signed)
Addended by: Mathis Bud on: 06/29/2019 10:34 AM   Modules accepted: Orders

## 2019-06-29 NOTE — Progress Notes (Signed)
PFT completed today.  

## 2019-06-30 NOTE — Telephone Encounter (Signed)
Yes Ok with me 

## 2019-06-30 NOTE — Progress Notes (Signed)
Spoke with pt and notified of results per Dr. Wert. Pt verbalized understanding and denied any questions. 

## 2019-06-30 NOTE — Telephone Encounter (Signed)
Spoke with the pt and notified ok to change back to Dr Lamonte Sakai  She is aware his schedule is not out for 3 months and there is already a reminder placed for this appt

## 2019-08-04 DIAGNOSIS — E559 Vitamin D deficiency, unspecified: Secondary | ICD-10-CM | POA: Diagnosis not present

## 2019-08-04 DIAGNOSIS — M3500 Sicca syndrome, unspecified: Secondary | ICD-10-CM | POA: Diagnosis not present

## 2019-08-04 DIAGNOSIS — E1122 Type 2 diabetes mellitus with diabetic chronic kidney disease: Secondary | ICD-10-CM | POA: Diagnosis not present

## 2019-08-04 DIAGNOSIS — I10 Essential (primary) hypertension: Secondary | ICD-10-CM | POA: Diagnosis not present

## 2019-08-04 DIAGNOSIS — D509 Iron deficiency anemia, unspecified: Secondary | ICD-10-CM | POA: Diagnosis not present

## 2019-08-04 DIAGNOSIS — J329 Chronic sinusitis, unspecified: Secondary | ICD-10-CM | POA: Diagnosis not present

## 2019-08-04 DIAGNOSIS — J479 Bronchiectasis, uncomplicated: Secondary | ICD-10-CM | POA: Diagnosis not present

## 2019-08-04 DIAGNOSIS — D680 Von Willebrand's disease: Secondary | ICD-10-CM | POA: Diagnosis not present

## 2019-08-04 DIAGNOSIS — N182 Chronic kidney disease, stage 2 (mild): Secondary | ICD-10-CM | POA: Diagnosis not present

## 2019-09-07 DIAGNOSIS — J849 Interstitial pulmonary disease, unspecified: Secondary | ICD-10-CM | POA: Diagnosis not present

## 2019-09-07 DIAGNOSIS — E663 Overweight: Secondary | ICD-10-CM | POA: Diagnosis not present

## 2019-09-07 DIAGNOSIS — D638 Anemia in other chronic diseases classified elsewhere: Secondary | ICD-10-CM | POA: Diagnosis not present

## 2019-09-07 DIAGNOSIS — Z6829 Body mass index (BMI) 29.0-29.9, adult: Secondary | ICD-10-CM | POA: Diagnosis not present

## 2019-09-07 DIAGNOSIS — M2550 Pain in unspecified joint: Secondary | ICD-10-CM | POA: Diagnosis not present

## 2019-09-07 DIAGNOSIS — D890 Polyclonal hypergammaglobulinemia: Secondary | ICD-10-CM | POA: Diagnosis not present

## 2019-09-07 DIAGNOSIS — M3500 Sicca syndrome, unspecified: Secondary | ICD-10-CM | POA: Diagnosis not present

## 2019-09-26 DIAGNOSIS — Z1231 Encounter for screening mammogram for malignant neoplasm of breast: Secondary | ICD-10-CM | POA: Diagnosis not present

## 2019-10-07 ENCOUNTER — Telehealth: Payer: Self-pay | Admitting: Emergency Medicine

## 2019-10-07 NOTE — Telephone Encounter (Signed)
Called and spoke with pt letting her know the info stated by MW and she verbalized understanding. Nothing further needed. 

## 2019-10-07 NOTE — Telephone Encounter (Signed)
Called and spoke with pt letting her know that we were going to check with provider to see if they think she should get the booster vaccine and she verbalized understanding. Stated that we would call her back once we had that info. Pt said it is okay to leave detailed message on machine with that info if she does not pick up.  Even though pt is switching providers from Dr. Melvyn Novas to Dr. Lamonte Sakai, since pt has not yet seen Dr. Lamonte Sakai for an appt, sending this to Dr. Melvyn Novas for him to view. Dr. Melvyn Novas, please advise.

## 2019-10-07 NOTE — Telephone Encounter (Signed)
It will probably be approved for everyone over 60 x 8 months after last shot- but not yet - so stay tuned to [facility] website for more info in the coming weeks

## 2019-10-25 ENCOUNTER — Ambulatory Visit: Payer: Medicare PPO | Attending: Internal Medicine

## 2019-10-25 DIAGNOSIS — Z23 Encounter for immunization: Secondary | ICD-10-CM

## 2019-10-25 NOTE — Progress Notes (Signed)
   Cov[facility]-19 Vaccination [facility]  Name:  BERDELL NEVITT    MRN: [id number] DOB: Mar 09, 1950  10/25/2019  Ms. Shands was observed post Covid-19 immunization for 15 minutes without incident. She was provided with Vaccine Information Sheet and instruction to access the V-Safe system.   Ms. Grunden was instructed to call 911 with any severe reactions post vaccine: Marland Kitchen Difficulty breathing  . Swelling of face and throat  . A fast heartbeat  . A bad rash all over body  . Dizziness and weakness

## 2019-10-26 DIAGNOSIS — I10 Essential (primary) hypertension: Secondary | ICD-10-CM | POA: Diagnosis not present

## 2019-10-26 DIAGNOSIS — J029 Acute pharyngitis, unspecified: Secondary | ICD-10-CM | POA: Diagnosis not present

## 2019-10-26 DIAGNOSIS — E871 Hypo-osmolality and hyponatremia: Secondary | ICD-10-CM | POA: Diagnosis not present

## 2019-11-21 DIAGNOSIS — E871 Hypo-osmolality and hyponatremia: Secondary | ICD-10-CM | POA: Diagnosis not present

## 2019-12-05 DIAGNOSIS — Z79899 Other long term (current) drug therapy: Secondary | ICD-10-CM | POA: Diagnosis not present

## 2019-12-05 DIAGNOSIS — E119 Type 2 diabetes mellitus without complications: Secondary | ICD-10-CM | POA: Diagnosis not present

## 2019-12-05 DIAGNOSIS — M3500 Sicca syndrome, unspecified: Secondary | ICD-10-CM | POA: Diagnosis not present

## 2019-12-05 DIAGNOSIS — H2513 Age-related nuclear cataract, bilateral: Secondary | ICD-10-CM | POA: Diagnosis not present

## 2019-12-08 DIAGNOSIS — M2550 Pain in unspecified joint: Secondary | ICD-10-CM | POA: Diagnosis not present

## 2019-12-08 DIAGNOSIS — E663 Overweight: Secondary | ICD-10-CM | POA: Diagnosis not present

## 2019-12-08 DIAGNOSIS — M3500 Sicca syndrome, unspecified: Secondary | ICD-10-CM | POA: Diagnosis not present

## 2019-12-08 DIAGNOSIS — D638 Anemia in other chronic diseases classified elsewhere: Secondary | ICD-10-CM | POA: Diagnosis not present

## 2019-12-08 DIAGNOSIS — Z6828 Body mass index (BMI) 28.0-28.9, adult: Secondary | ICD-10-CM | POA: Diagnosis not present

## 2019-12-08 DIAGNOSIS — J849 Interstitial pulmonary disease, unspecified: Secondary | ICD-10-CM | POA: Diagnosis not present

## 2019-12-08 DIAGNOSIS — D890 Polyclonal hypergammaglobulinemia: Secondary | ICD-10-CM | POA: Diagnosis not present

## 2019-12-16 DIAGNOSIS — G43909 Migraine, unspecified, not intractable, without status migrainosus: Secondary | ICD-10-CM | POA: Diagnosis not present

## 2019-12-16 DIAGNOSIS — R350 Frequency of micturition: Secondary | ICD-10-CM | POA: Diagnosis not present

## 2019-12-16 DIAGNOSIS — I10 Essential (primary) hypertension: Secondary | ICD-10-CM | POA: Diagnosis not present

## 2020-01-05 DIAGNOSIS — D680 Von Willebrand's disease: Secondary | ICD-10-CM | POA: Diagnosis not present

## 2020-01-05 DIAGNOSIS — Z1389 Encounter for screening for other disorder: Secondary | ICD-10-CM | POA: Diagnosis not present

## 2020-01-05 DIAGNOSIS — I10 Essential (primary) hypertension: Secondary | ICD-10-CM | POA: Diagnosis not present

## 2020-01-05 DIAGNOSIS — Z0000 Encounter for general adult medical examination without abnormal findings: Secondary | ICD-10-CM | POA: Diagnosis not present

## 2020-01-05 DIAGNOSIS — J329 Chronic sinusitis, unspecified: Secondary | ICD-10-CM | POA: Diagnosis not present

## 2020-01-05 DIAGNOSIS — E1122 Type 2 diabetes mellitus with diabetic chronic kidney disease: Secondary | ICD-10-CM | POA: Diagnosis not present

## 2020-01-05 DIAGNOSIS — M3500 Sicca syndrome, unspecified: Secondary | ICD-10-CM | POA: Diagnosis not present

## 2020-01-05 DIAGNOSIS — J309 Allergic rhinitis, unspecified: Secondary | ICD-10-CM | POA: Diagnosis not present

## 2020-01-05 DIAGNOSIS — G43909 Migraine, unspecified, not intractable, without status migrainosus: Secondary | ICD-10-CM | POA: Diagnosis not present

## 2020-01-05 DIAGNOSIS — J479 Bronchiectasis, uncomplicated: Secondary | ICD-10-CM | POA: Diagnosis not present

## 2020-01-05 DIAGNOSIS — N182 Chronic kidney disease, stage 2 (mild): Secondary | ICD-10-CM | POA: Diagnosis not present

## 2020-01-17 DIAGNOSIS — E1122 Type 2 diabetes mellitus with diabetic chronic kidney disease: Secondary | ICD-10-CM | POA: Diagnosis not present

## 2020-01-17 DIAGNOSIS — M3500 Sicca syndrome, unspecified: Secondary | ICD-10-CM | POA: Diagnosis not present

## 2020-01-17 DIAGNOSIS — D680 Von Willebrand's disease: Secondary | ICD-10-CM | POA: Diagnosis not present

## 2020-01-17 DIAGNOSIS — Z7984 Long term (current) use of oral hypoglycemic drugs: Secondary | ICD-10-CM | POA: Diagnosis not present

## 2020-01-17 DIAGNOSIS — N182 Chronic kidney disease, stage 2 (mild): Secondary | ICD-10-CM | POA: Diagnosis not present

## 2020-01-17 DIAGNOSIS — I10 Essential (primary) hypertension: Secondary | ICD-10-CM | POA: Diagnosis not present

## 2020-01-31 DIAGNOSIS — Z6828 Body mass index (BMI) 28.0-28.9, adult: Secondary | ICD-10-CM | POA: Diagnosis not present

## 2020-01-31 DIAGNOSIS — M2550 Pain in unspecified joint: Secondary | ICD-10-CM | POA: Diagnosis not present

## 2020-01-31 DIAGNOSIS — D890 Polyclonal hypergammaglobulinemia: Secondary | ICD-10-CM | POA: Diagnosis not present

## 2020-01-31 DIAGNOSIS — E663 Overweight: Secondary | ICD-10-CM | POA: Diagnosis not present

## 2020-01-31 DIAGNOSIS — J849 Interstitial pulmonary disease, unspecified: Secondary | ICD-10-CM | POA: Diagnosis not present

## 2020-01-31 DIAGNOSIS — D638 Anemia in other chronic diseases classified elsewhere: Secondary | ICD-10-CM | POA: Diagnosis not present

## 2020-01-31 DIAGNOSIS — M3500 Sicca syndrome, unspecified: Secondary | ICD-10-CM | POA: Diagnosis not present

## 2020-02-02 ENCOUNTER — Other Ambulatory Visit: Payer: Self-pay

## 2020-02-02 ENCOUNTER — Encounter: Payer: Self-pay | Admitting: Emergency Medicine

## 2020-02-02 ENCOUNTER — Ambulatory Visit: Payer: Medicare PPO | Admitting: Emergency Medicine

## 2020-02-02 DIAGNOSIS — J479 Bronchiectasis, uncomplicated: Secondary | ICD-10-CM

## 2020-02-02 NOTE — Patient Instructions (Addendum)
We will plan to repeat your CT scan of the chest without contrast to follow bronchiectasis Continue flutter valve twice a day as you have been using it Continue Mucinex as needed to help with mucus clearance You may want to try restarting fluticasone nasal spray, 2 sprays each nostril once daily to help with congestion Try starting loratadine 10 mg once daily to help with congestion We will hold off on starting any inhaled medication for now Follow with Dr Delton Coombes next available after your CT scan so that we can review the results together.

## 2020-02-02 NOTE — Assessment & Plan Note (Signed)
Produces some clear mucus every day, easy to clear.  She uses flutter valve, Mucinex.  Has not required bronchodilators.  She does have sinus drainage and congestion.  She is diagnosed with Sjogren's syndrome since last time I have seen her, is considering an alternative immunosuppressive regimen with Dr. Dierdre Forth.  I will obtain his notes, send our notes to him for review.  In the short-term I think she needs a repeat CT chest to compare with 2010, assess her degree of bronchiectasis, any evidence for other inflammatory process.  We will plan to repeat your CT scan of the chest without contrast to follow bronchiectasis Continue flutter valve twice a day as you have been using it Continue Mucinex as needed to help with mucus clearance You may want to try restarting fluticasone nasal spray, 2 sprays each nostril once daily to help with congestion Try starting loratadine 10 mg once daily to help with congestion We will hold off on starting any inhaled medication for now Follow with Dr Delton Coombes next available after your CT scan so that we can review the results together.

## 2020-02-02 NOTE — Addendum Note (Signed)
Addended by: Maurene Capes on: 02/02/2020 04:48 PM   Modules accepted: Orders

## 2020-02-02 NOTE — Progress Notes (Signed)
Subjective:    Patient ID: Brandy Medina, female    DOB: 1950-09-05, 70 y.o.   MRN: [id number]  HPI 70 year old woman, former smoker (9-10 pack years) whom I have seen over 10 years ago for bronchiectasis and chronic cough.  History of von Willebrand's disease, polyclonal gammopathy, diverticulitis, GERD, chronic rhinitis.  She had a bronchoscopy in July 2010 that was AFB and fungal negative.  Last seen here by B.Mack 06/29/2019.  She was dx with Sjogren's in 2017 after she was evaluated for a polyclonal gammopathy. She was seen by Dr Dierdre Forth and treated with hydroxychlorquine, has been off this since Flonase as needed, Delsym as needed, chlorpheniramine as needed. She has a lot of sinus and nasal congestion drainage. She has cough, some sputum every day. Usually clear, no hemoptysis.  Flutter valve about twice a day. She is using mucinex prn.  Denies any SOB, good exercise tolerance.   Pulmonary function testing done 06/29/2019 reviewed by me, shows grossly normal airflows although there was intermittent large airway obstruction and her postbronchodilator trials are probably invalid.  Lung volumes normal.  Diffusion capacity slightly decreased.  Most recent chest imaging that I have available is from 01/15/2009 which showed stable bilateral bronchiectasis with cystic changes most notable in the right upper lobe. CT sinuses done on 02/25/2019 reviewed shows hypoplastic frontal and sphenoid sinuses trace mucosal thickening in the ethmoid air cells, left sphenoid and left maxillary sinuses, mild leftward deviation of the nasal septum   Review of Systems As per HPI  Past Medical History:  Diagnosis Date  . Allergic rhinitis   . Diabetes (HCC)   . HTN (hypertension)   . Migraines      Family History  Problem Relation Age of Onset  . Renal Disease Brother   . Stroke Brother   . Bronchitis Mother   . Heart disease Mother   . Heart disease Father   . Heart attack Father   . Hypertension  Sister   . Diabetes Sister   . Heart attack Brother   . Hypertension Sister   . Diabetes Sister      Social History   Socioeconomic History  . Marital status: Married    Spouse name: Not on file  . Number of children: Not on file  . Years of education: Not on file  . Highest education level: Not on file  Occupational History  . Not on file  Tobacco Use  . Smoking status: Former Smoker    Packs/day: 0.50    Years: 18.00    Pack years: 9.00    Types: Cigarettes    Quit date: 07/05/1993    Years since quitting: 26.5  . Smokeless tobacco: Never Used  Substance and Sexual Activity  . Alcohol use: Not on file  . Drug use: Not on file  . Sexual activity: Not on file  Other Topics Concern  . Not on file  Social History Narrative  . Not on file   Social Determinants of Health   Financial Resource Strain: Not on file  Food Insecurity: Not on file  Transportation Needs: Not on file  Physical Activity: Not on file  Stress: Not on file  Social Connections: Not on file  Intimate Partner Violence: Not on file     Allergies  Allergen Reactions  . Aspirin   . Avelox [Moxifloxacin Hcl In Nacl] Other (See Comments)    confusion  . Codeine     REACTION: nausea  . Erythromycin  GI upset  . Lisinopril     REACTION: Headache  . Sulfonamide Derivatives     REACTION: headache     Outpatient Medications Prior to Visit  Medication Sig Dispense Refill  . acetaminophen (TYLENOL) 325 MG tablet Take 650 mg by mouth every 6 (six) hours as needed for headache.    Marland Kitchen amLODipine (NORVASC) 5 MG tablet 1 tablet    . butalbital-acetaminophen-caffeine (FIORICET WITH CODEINE) 50-325-40-30 MG capsule Take 1 capsule by mouth every 8 (eight) hours as needed for headache.    . chlorpheniramine (CHLOR-TRIMETON) 4 MG tablet 1 at bedtime and 1 every 4 hours as needed during the day    . desmopressin (DDAVP NASAL) 0.01 % solution Place 10 mcg into the nose as directed. Instill 0.05 drop in nostril  once a day at time of procedures/surgery    . dextromethorphan (DELSYM) 30 MG/5ML liquid 2 every 12 hours as needed    . Ergocalciferol (VITAMIN D2) 2000 units TABS Take 1 tablet by mouth 2 (two) times a week.    . fluticasone (FLONASE) 50 MCG/ACT nasal spray Place 2 sprays into both nostrils daily as needed for allergies or rhinitis.    Marland Kitchen losartan (COZAAR) 100 MG tablet Take 1 tablet by mouth daily.    . metFORMIN (GLUCOPHAGE) 500 MG tablet Take 500 mg by mouth daily with breakfast.   0  . Respiratory Therapy Supplies (FLUTTER) DEVI Use as directed. 1 each 0  . hydroxychloroquine (PLAQUENIL) 200 MG tablet Take 400 mg by mouth daily.    Marland Kitchen Respiratory Therapy Supplies (FLUTTER) DEVI Use as directed (Patient not taking: Reported on 06/29/2019) 1 each 0  . triamterene-hydrochlorothiazide (MAXZIDE-25) 37.5-25 MG per tablet Take 1 tablet by mouth daily. Taking 1/2 tablet daily  0  . valsartan (DIOVAN) 160 MG tablet take 1 tablet by mouth once daily 90 tablet 0   No facility-administered medications prior to visit.        Objective:   Physical Exam Vitals:   02/02/20 1443  BP: 128/72  Pulse: 95  Temp: (!) 97.2 F (36.2 C)  TempSrc: Temporal  SpO2: 96%  Weight: 167 lb 3.2 oz (75.8 kg)  Height: 5' 4.5" (1.638 m)   Gen: Pleasant, well-nourished, in no distress,  normal affect  ENT: No lesions,  mouth clear, some hyperpigmentation patchy on the tongue, oropharynx clear, no postnasal drip  Neck: No JVD, no stridor  Lungs: No use of accessory muscles, no crackles or wheezing on normal respiration, no wheeze on forced expiration  Cardiovascular: RRR, heart sounds normal, no murmur or gallops, no peripheral edema  Musculoskeletal: No deformities, no cyanosis or clubbing  Neuro: alert, awake, non focal  Skin: Warm, no lesions or rash      Assessment & Plan:  Obstructive bronchiectasis (HCC) Produces some clear mucus every day, easy to clear.  She uses flutter valve, Mucinex.  Has not  required bronchodilators.  She does have sinus drainage and congestion.  She is diagnosed with Sjogren's syndrome since last time I have seen her, is considering an alternative immunosuppressive regimen with Dr. Dierdre Forth.  I will obtain his notes, send our notes to him for review.  In the short-term I think she needs a repeat CT chest to compare with 2010, assess her degree of bronchiectasis, any evidence for other inflammatory process.  We will plan to repeat your CT scan of the chest without contrast to follow bronchiectasis Continue flutter valve twice a day as you have been using it Continue  Mucinex as needed to help with mucus clearance You may want to try restarting fluticasone nasal spray, 2 sprays each nostril once daily to help with congestion Try starting loratadine 10 mg once daily to help with congestion We will hold off on starting any inhaled medication for now Follow with Dr Delton Coombes next available after your CT scan so that we can review the results together.  Levy Pupa, MD, PhD 02/02/2020, 3:09 PM [facility] [phone] or if no answer [phone]

## 2020-02-20 ENCOUNTER — Other Ambulatory Visit: Payer: Self-pay

## 2020-02-20 ENCOUNTER — Ambulatory Visit
Admission: RE | Admit: 2020-02-20 | Discharge: 2020-02-20 | Disposition: A | Discharge status: Home / Self Care | Payer: Medicare PPO | Source: Ambulatory Visit | Attending: Emergency Medicine | Admitting: Emergency Medicine

## 2020-02-20 DIAGNOSIS — J479 Bronchiectasis, uncomplicated: Secondary | ICD-10-CM | POA: Diagnosis not present

## 2020-02-20 DIAGNOSIS — K449 Diaphragmatic hernia without obstruction or gangrene: Secondary | ICD-10-CM | POA: Diagnosis not present

## 2020-02-20 DIAGNOSIS — J8410 Pulmonary fibrosis, unspecified: Secondary | ICD-10-CM | POA: Diagnosis not present

## 2020-02-20 DIAGNOSIS — I700 Atherosclerosis of aorta: Secondary | ICD-10-CM | POA: Diagnosis not present

## 2020-02-22 DIAGNOSIS — Z01419 Encounter for gynecological examination (general) (routine) without abnormal findings: Secondary | ICD-10-CM | POA: Diagnosis not present

## 2020-02-22 DIAGNOSIS — R350 Frequency of micturition: Secondary | ICD-10-CM | POA: Diagnosis not present

## 2020-02-24 ENCOUNTER — Telehealth: Payer: Self-pay | Admitting: Emergency Medicine

## 2020-02-24 DIAGNOSIS — Z20828 Contact with and (suspected) exposure to other viral communicable diseases: Secondary | ICD-10-CM | POA: Diagnosis not present

## 2020-02-24 NOTE — Telephone Encounter (Signed)
I called and spoke with the pt and notified of results/recs per Dr Lamonte Sakai. She verbalized understanding. I put her with Aaron Edelman for Monday 02/27/20 at 4:30 since RB has nothing open.

## 2020-02-24 NOTE — Telephone Encounter (Signed)
Please let her know that her CT chest shows evidence for inflammation and scarring changes that have progressed since her last scan in 2010. I would like to see her in office to discuss the scan further, to discuss her cough and mucous production. Please make sure sue is on loratadine, flonase, mucinex as we discussed at her office visit.

## 2020-02-24 NOTE — Telephone Encounter (Signed)
Called spoke with patient.  She is looking for her results to her CT done 02/20/20. I let patient know that Dr. Lamonte Sakai has been out of office all week. I would send a message to him to review.   She also said she is have lots of congestion. Her PCP put her on an antibiotic but it upset her stomach so she quick it and wants recommendations from Dr. Ileene Musa. I ask her if she had been recently tested and she said no but she would go get that done today or tomorrow.   Dr. Lamonte Sakai please advise.

## 2020-02-27 ENCOUNTER — Ambulatory Visit: Payer: Medicare PPO | Admitting: Pulmonary Disease

## 2020-02-27 ENCOUNTER — Encounter: Payer: Self-pay | Admitting: Pulmonary Disease

## 2020-02-27 ENCOUNTER — Other Ambulatory Visit: Payer: Self-pay

## 2020-02-27 VITALS — BP 120/70 | HR 88 | Temp 98.2°F | Ht 64.5 in | Wt 168.0 lb

## 2020-02-27 DIAGNOSIS — J329 Chronic sinusitis, unspecified: Secondary | ICD-10-CM

## 2020-02-27 DIAGNOSIS — M3500 Sicca syndrome, unspecified: Secondary | ICD-10-CM

## 2020-02-27 DIAGNOSIS — K589 Irritable bowel syndrome without diarrhea: Secondary | ICD-10-CM | POA: Diagnosis not present

## 2020-02-27 DIAGNOSIS — K219 Gastro-esophageal reflux disease without esophagitis: Secondary | ICD-10-CM | POA: Diagnosis not present

## 2020-02-27 DIAGNOSIS — J479 Bronchiectasis, uncomplicated: Secondary | ICD-10-CM

## 2020-02-27 DIAGNOSIS — F411 Generalized anxiety disorder: Secondary | ICD-10-CM | POA: Diagnosis not present

## 2020-02-27 DIAGNOSIS — R918 Other nonspecific abnormal finding of lung field: Secondary | ICD-10-CM

## 2020-02-27 DIAGNOSIS — R0789 Other chest pain: Secondary | ICD-10-CM | POA: Diagnosis not present

## 2020-02-27 DIAGNOSIS — I10 Essential (primary) hypertension: Secondary | ICD-10-CM | POA: Diagnosis not present

## 2020-02-27 DIAGNOSIS — J849 Interstitial pulmonary disease, unspecified: Secondary | ICD-10-CM | POA: Diagnosis not present

## 2020-02-27 MED ORDER — STIOLTO RESPIMAT 2.5-2.5 MCG/ACT IN AERS: 2.0000 | INHALATION_SPRAY | Freq: Every day | RESPIRATORY_TRACT | 0 refills | Status: DC

## 2020-02-27 NOTE — Assessment & Plan Note (Signed)
Established with Dr. Amil Amen Previously managed on Plaquenil, patient reports that she stopped this due to skin darkening Rheumatology is recommending Imuran start  Plan: Start Imuran

## 2020-02-27 NOTE — Progress Notes (Signed)
Patient seen in the office today and instructed on use of Stiolto.  Patient expressed understanding and demonstrated technique.  Brandy Medina [facility] 02/27/2020

## 2020-02-27 NOTE — Assessment & Plan Note (Signed)
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 Lamonte Sakai  Plan: Proceed forward Imuran start as outlined by rheumatology Establish with ILD specialist Dr. Vaughan Browner or Dr. Chase Caller sometime over the next 6 weeks Spirometry with DLCO ordered

## 2020-02-27 NOTE — Patient Instructions (Addendum)
You were seen today by Lauraine Rinne, NP  for:   1. ILD (interstitial lung disease) ([place])  - Pulmonary function test; Future  We will get you set up with an ILD specialist in our practice as discussed such as Dr. Chase Caller or Dr. Vaughan Browner  We will repeat pulmonary function testing-30-minute  Continue forward with rheumatology  2. Sjogren's syndrome, with unspecified organ involvement ([place])  Continue follow-up with rheumatology  We will route our office note to them today as well as your CT chest results  Proceed forward with Imuran start  3. Abnormal findings on diagnostic imaging of lung  We will continue clinically monitor.  Dr. Vaughan Browner or Dr. Chase Caller can decide when they would like to repeat your CT next  4. Obstructive bronchiectasis (HCC)  - Respiratory or Resp and Sputum Culture; Future - AFB Culture & Smear; Future - Culture, fungus without smear; Future  Continue flutter valve  Trial of Stiolto Respimat inhaler >>>2 puffs daily >>>Take this no matter what >>>This is not a rescue inhaler   We recommend today:  Orders Placed This Encounter  Procedures  . Respiratory or Resp and Sputum Culture    Standing Status:   Future    Standing Expiration Date:   02/26/2021  . AFB Culture & Smear    Standing Status:   Future    Standing Expiration Date:   02/26/2021  . Culture, fungus without smear    Standing Status:   Future    Standing Expiration Date:   02/26/2021  . Pulmonary function test    Arlyce Harman dlco    Standing Status:   Future    Standing Expiration Date:   02/26/2021    Order Specific Question:   Where should this test be performed?    Answer:   [facility]   Orders Placed This Encounter  Procedures  . Respiratory or Resp and Sputum Culture  . AFB Culture & Smear  . Culture, fungus without smear  . Pulmonary function test   No orders of the defined types were placed in this encounter.   Follow Up:    Return in about 6 weeks (around 04/09/2020),  or if symptoms worsen or fail to improve, for Follow up with Dr. Vaughan Browner, Follow up with Dr. Purnell Shoemaker, ILD clinic - 73min slot.   Notification of test results are managed in the following manner: If there are  any recommendations or changes to the  plan of care discussed in office today,  we will contact you and let you know what they are. If you do not hear from [place], then your results are normal and you can view them through your  MyChart account , or a letter will be sent to you. Thank you again for trusting [place] with your care  - Thank you, [facility]    It is flu season:   >>> Best ways to protect herself from the flu: Receive the yearly flu vaccine, practice good hand hygiene washing with soap and also using hand sanitizer when available, eat a nutritious meals, get adequate rest, hydrate appropriately       Please contact the office if your symptoms worsen or you have concerns that you are not improving.   Thank you for choosing [facility] for your healthcare, and for allowing [place] to partner with you on your healthcare journey. I am thankful to be able to provide care to you today.   Wyn Quaker FNP-C

## 2020-02-27 NOTE — Progress Notes (Signed)
@Patient  ID: Brandy Medina, female    DOB: Sep 27, 1950, 70 y.o.   MRN: [id number]  Chief Complaint  Patient presents with  . Follow-up    4 week f/u for bronchiectasis. Wants to review her CT scan from the 24th. States her PCP give her an antibiotic that caused some GI discomfort. Doxycycline. Productive cough.     Referring provider: Wenda Low, MD  HPI:  70 year old female former smoker followed in our office for obstructive bronchiectasis and pulmonary fibrosis  PMH: hypoglycemia, GERD, diverticulitis, hypertension, Sjogren's Smoker/ Smoking History: Former smoker Maintenance: None Pt of: Dr. Lamonte Sakai  02/27/2020  - Visit   70 year old female former smoker followed in our office for obstructive bronchiectasis and pulmonary fibrosis.  She is established with Dr. Lamonte Sakai.  Last seen in January/2022.  Plan of care at that point in time was to repeat CT scan, continue flutter valve, start daily antihistamine.  Patient contacted our office in late January/2022 reporting that she continued to have ongoing congestion.  Primary care put her on a antibiotic which patient was unable to tolerate.  Repeat CT scan results are listed below:  02/20/2020-CT chest without contrast-moderate to severe pulmonary fibrosis in the pattern with a slight apical to basilar gradient although generally heterogeneous distribution with very severe fibrosis and bronchiectasis in certain areas.  Findings are substantially worsened compared to prior exam on 01/15/2009.  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  There is a superimposed dense groundglass and heterogeneous opacity in the peripheral right upper lobe which may reflect acute infection or inflammation.  Recommend follow-up CT in 3 months to ensure resolution.  Patient presenting to office today reporting she is established with Dr. Amil Amen for rheumatology for Sjogren's syndrome.  She was previously managed on Plaquenil but she stopped  this due to feeling it was darkening her skin.  She had been off of maintenance therapy for quite some time.  She reports that Dr. Amil Amen is suggesting getting started on Imuran.  Patient was hesitant to this and would like to discuss it today.  She was treated with amoxicillin for help with congestion.  She did not feel that this helped.  She was later placed on doxycycline by primary care and this caused GI upset.  She continues to have purulent productive mucus.  Questionaires / Pulmonary Flowsheets:   ACT:  No flowsheet data found.  MMRC: mMRC Dyspnea Scale mMRC Score  02/02/2020 1    Epworth:  No flowsheet data found.  Tests:   02/20/2020-CT chest without contrast-moderate to severe pulmonary fibrosis in the pattern with a slight apical to basilar gradient although generally heterogeneous distribution with very severe fibrosis and bronchiectasis in certain areas.  Findings are substantially worsened compared to prior exam on 01/15/2009.  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  There is a superimposed dense groundglass and heterogeneous opacity in the peripheral right upper lobe which may reflect acute infection or inflammation.  Recommend follow-up CT in 3 months to ensure resolution.  06/29/2019-pulmonary function test-FVC 2.94 (118% addicted), postbronchodilator ratio 52, postbronchodilator FEV1 1.47 (76% predicted), no bronchodilator response, DLCO 12.61 (63% predicted)  FENO:  No results found for: NITRICOXIDE  PFT: PFT Results Latest Ref Rng & Units 06/29/2019 09/19/2014  FVC-Pre L 2.94 2.94  FVC-Predicted Pre % 118 113  FVC-Post L 2.82 2.92  FVC-Predicted Post % 113 112  Pre FEV1/FVC % % 72 56  Post FEV1/FCV % % 52 72  FEV1-Pre L 2.12 1.65  FEV1-Predicted Pre % 109 81  FEV1-Post L 1.47 2.09  DLCO uncorrected ml/min/mmHg 12.61 -  DLCO UNC% % 63 -  DLVA Predicted % 78 -  TLC L 4.98 4.89  TLC % Predicted % 97 95  RV % Predicted % 95 99    WALK:   No flowsheet data found.  Imaging: CT Chest Wo Contrast  Result Date: 02/20/2020 CLINICAL DATA:  Bronchiectasis EXAM: CT CHEST WITHOUT CONTRAST TECHNIQUE: Multidetector CT imaging of the chest was performed following the standard protocol without IV contrast. COMPARISON:  01/15/2009 FINDINGS: Cardiovascular: Scattered aortic atherosclerosis. Normal heart size. No pericardial effusion. Mediastinum/Nodes: Prominent pretracheal lymph nodes, similar to prior examination. Small hiatal hernia. Thyroid gland, trachea, and esophagus demonstrate no significant findings. Lungs/Pleura: Moderate to severe pulmonary fibrosis in a pattern with a slight apical to basal gradient, although a generally heterogeneous distribution, with very severe fibrosis and bronchiectasis in certain areas, particularly of the anterior right upper lobe (series 8, image 57). Fibrotic features include traction bronchiectasis, subpleural bronchiolectasis, and areas of honeycombing particularly at the lung bases. Fibrotic findings are substantially worsened compared to prior examination dated 01/15/2009. There is superimposed, dense ground-glass and heterogeneous opacity of the peripheral right upper lobe (series 8, image 42). No pleural effusion or pneumothorax. Upper Abdomen: No acute abnormality. Musculoskeletal: No chest wall mass or suspicious bone lesions identified. IMPRESSION: 1. Moderate to severe pulmonary fibrosis in a pattern with a slight apical to basal gradient, although a generally heterogeneous distribution, with very severe fibrosis and bronchiectasis in certain areas, particularly of the anterior right upper lobe. Fibrotic features include traction bronchiectasis, subpleural bronchiolectasis, and areas of honeycombing particularly at the lung bases. 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 Findings are  consistent with UIP per consensus guidelines: Diagnosis of Idiopathic Pulmonary Fibrosis: An Official ATS/ERS/JRS/ALAT Clinical Practice Guideline. [place], Iss 5, [phone], Sep 27 2016. 2. There is superimposed, dense ground-glass and heterogeneous opacity of the peripheral right upper lobe , which may reflect acute infection or inflammation. Recommend follow-up CT in 3 months to ensure resolution. Aortic Atherosclerosis (ICD10-I70.0). Electronically Signed   By: Eddie Candle M.D.   On: 02/20/2020 14:11    Lab Results:  CBC    Component Value Date/Time   WBC 4.2 11/20/2015 1608   WBC 4.4 07/06/2014 1614   RBC 3.00 (L) 11/20/2015 1608   RBC 3.11 (L) 07/06/2014 1614   HGB 9.5 (L) 11/20/2015 1608   HCT 27.7 (L) 11/20/2015 1608   PLT 249 11/20/2015 1608   MCV 92.3 11/20/2015 1608   MCH 31.7 11/20/2015 1608   MCHC 34.3 11/20/2015 1608   MCHC 33.6 07/06/2014 1614   RDW 14.3 11/20/2015 1608   LYMPHSABS 1.0 11/20/2015 1608   MONOABS 0.4 11/20/2015 1608   EOSABS 0.0 11/20/2015 1608   BASOSABS 0.0 11/20/2015 1608    BMET    Component Value Date/Time   NA 133 (L) 11/20/2015 1608   K 4.2 11/20/2015 1608   CO2 24 11/20/2015 1608   GLUCOSE 90 11/20/2015 1608   BUN 25.3 11/20/2015 1608   CREATININE 1.0 11/20/2015 1608   CALCIUM 10.5 (H) 11/20/2015 1608    BNP No results found for: BNP  ProBNP No results found for: PROBNP  Specialty Problems      Pulmonary Problems   Sinusitis, chronic    Qualifier: Diagnosis of  By: Quentin Cornwall  CMA, Jessica        Obstructive bronchiectasis ([place])    CT 01/15/09 Stable bronchiectasis and cystic changes throughout both lungs, most extensive in the right upper lobe, since the prior examination from June, 2010. 2. Ground-glass opacities in the lower lobes, improved since the prior examination. This is consistent with improved but residual mild acute inflammatory changes. - alpha one AT 07/06/14 >>  MM  - flutter  valve added 08/04/14  - PFT's  09/19/2014  FEV1 2.09 (103 % ) ratio 72  p 27 % improvement from saba - 09/19/2014   90% >> dulera 100  2bid trial  - 12/25/2014  extensive coaching HFA effectiveness =    75% from a baseline of < 50%  - sinus CT 12/25/2014 >Negative paranasal sinuses. - PFT's 06/29/19  FEV1 2.12 (109 % ) ratio 0.72  p 0 % improvement from saba p 0 prior to study with DLCO  12.61 (63%) corrects to 3.26 (78%)  for alv volume and FV curve nl baseline but p saba markedly flattened in effort dependent portion of exp loop          Upper airway cough syndrome    Followed in Pulmonary clinic/ [facility]/ [place]  - allergy profile 07/06/2014 > Eos 0,  IgE 16  Pos RAST dust only  - improved 09/19/2014 with gerd rx > try just pepcid at hs  - Teoh eval neg 2017        ILD (interstitial lung disease) ([place])      Allergies  Allergen Reactions  . Aspirin   . Avelox [Moxifloxacin Hcl In Nacl] Other (See Comments)    confusion  . Codeine     REACTION: nausea  . Erythromycin     GI upset  . Lisinopril     REACTION: Headache  . Sulfonamide Derivatives     REACTION: headache    Immunization History  Administered Date(s) Administered  . H1N1 03/27/2008  . Influenza Split 09/28/2014  . Influenza, High Dose Seasonal PF 10/29/2018  . Influenza,inj,Quad PF,6+ Mos 10/02/2015  . PFIZER(Purple Top)SARS-COV-2 Vaccination 03/07/2019, 04/01/2019, 10/25/2019  . Pneumococcal Conjugate-13 09/19/2014  . Pneumococcal Polysaccharide-23 10/02/2015  . Zoster Recombinat (Shingrix) 03/18/2018, 07/15/2018    Past Medical History:  Diagnosis Date  . Allergic rhinitis   . Diabetes ([place])   . HTN (hypertension)   . Migraines     Tobacco History: Social History   Tobacco Use  Smoking Status Former Smoker  . Packs/day: 0.50  . Years: 18.00  . Pack years: 9.00  . Types: Cigarettes  . Quit date: 07/05/1993  . Years since quitting: 26.6  Smokeless Tobacco Never Used   Counseling given:  Not Answered   Continue to not smoke  Outpatient Encounter Medications as of 02/27/2020  Medication Sig  . acetaminophen (TYLENOL) 325 MG tablet Take 650 mg by mouth every 6 (six) hours as needed for headache.  Marland Kitchen amLODipine (NORVASC) 5 MG tablet 1 tablet  . azaTHIOprine (IMURAN) 50 MG tablet Take 50 mg by mouth in the morning and at bedtime.  . butalbital-acetaminophen-caffeine (FIORICET WITH CODEINE) 50-325-40-30 MG capsule Take 1 capsule by mouth every 8 (eight) hours as needed for headache.  . chlorpheniramine (CHLOR-TRIMETON) 4 MG tablet 1 at bedtime and 1 every 4 hours as needed during the day  . desmopressin (DDAVP NASAL) 0.01 % solution Place 10 mcg into the nose as directed. Instill 0.05 drop in nostril once a day at time of procedures/surgery  . dextromethorphan (DELSYM)  30 MG/5ML liquid 2 every 12 hours as needed  . Ergocalciferol (VITAMIN D2) 2000 units TABS Take 1 tablet by mouth 2 (two) times a week.  . fluticasone (FLONASE) 50 MCG/ACT nasal spray Place 2 sprays into both nostrils daily as needed for allergies or rhinitis.  Marland Kitchen losartan (COZAAR) 100 MG tablet Take 1 tablet by mouth daily.  . metFORMIN (GLUCOPHAGE) 500 MG tablet Take 500 mg by mouth daily with breakfast.   . pilocarpine (SALAGEN) 5 MG tablet Take 5 mg by mouth in the morning, at noon, and at bedtime.  Marland Kitchen Respiratory Therapy Supplies (FLUTTER) DEVI Use as directed.  . Tiotropium Bromide-Olodaterol (STIOLTO RESPIMAT) 2.5-2.5 MCG/ACT AERS Inhale 2 puffs into the lungs daily.   No facility-administered encounter medications on file as of 02/27/2020.     Review of Systems  Review of Systems  Constitutional: Negative for activity change, fatigue and fever.  HENT: Negative for sinus pressure, sinus pain and sore throat.   Respiratory: Positive for shortness of breath. Negative for cough and wheezing.   Cardiovascular: Negative for chest pain and palpitations.  Gastrointestinal: Negative for diarrhea, nausea and  vomiting.  Musculoskeletal: Negative for arthralgias.  Neurological: Negative for dizziness.  Psychiatric/Behavioral: Negative for sleep disturbance. The patient is not nervous/anxious.      Physical Exam  BP 120/70   Pulse 88   Temp 98.2 F (36.8 C) (Temporal)   Ht 5' 4.5" (1.638 m)   Wt 168 lb (76.2 kg)   SpO2 98% Comment: on RA  BMI 28.39 kg/m   Wt Readings from Last 5 Encounters:  02/27/20 168 lb (76.2 kg)  02/02/20 167 lb 3.2 oz (75.8 kg)  06/29/19 173 lb 6.4 oz (78.7 kg)  01/17/19 172 lb 9.6 oz (78.3 kg)  12/13/15 176 lb 6.4 oz (80 kg)    BMI Readings from Last 5 Encounters:  02/27/20 28.39 kg/m  02/02/20 28.26 kg/m  06/29/19 29.30 kg/m  01/17/19 29.17 kg/m  12/13/15 30.28 kg/m     Physical Exam Vitals and nursing note reviewed.  Constitutional:      General: She is not in acute distress.    Appearance: Normal appearance. She is normal weight.  HENT:     Head: Normocephalic and atraumatic.     Right Ear: External ear normal.     Left Ear: External ear normal.     Nose: Nose normal. No congestion.     Mouth/Throat:     Mouth: Mucous membranes are moist.     Pharynx: Oropharynx is clear.  Eyes:     Pupils: Pupils are equal, round, and reactive to light.  Cardiovascular:     Rate and Rhythm: Normal rate and regular rhythm.     Pulses: Normal pulses.     Heart sounds: Normal heart sounds. No murmur heard.   Pulmonary:     Effort: Pulmonary effort is normal. No respiratory distress.     Breath sounds: No decreased air movement. Rales (bibasilar crackles ) present. No decreased breath sounds or wheezing.  Musculoskeletal:     Cervical back: Normal range of motion.  Skin:    General: Skin is warm and dry.     Capillary Refill: Capillary refill takes less than 2 seconds.  Neurological:     General: No focal deficit present.     Mental Status: She is alert and oriented to person, place, and time. Mental status is at baseline.     Gait: Gait normal.   Psychiatric:  Mood and Affect: Mood normal.        Behavior: Behavior normal.        Thought Content: Thought content normal.        Judgment: Judgment normal.       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 Lamonte Sakai  Plan: Proceed forward Imuran start as outlined by rheumatology Establish with ILD specialist Dr. Vaughan Browner or Dr. Chase Caller sometime over the next 6 weeks Spirometry with DLCO ordered   Obstructive bronchiectasis ([place]) Plan: Trial of Stiolto Respimat Sputum cultures-3 ways   Sinusitis, chronic Plan: Continue follow-up with ENT Continue Astelin nasal spray Start nasal saline rinses Start loratadine  Abnormal findings on diagnostic imaging of lung Likely Sjogren's related ILD  Plan: Establish with ILD specialist Can consider repeat CT in 3 to 12 months based off their clinical exam  Sjogren's syndrome ([place]) Established with Dr. Amil Amen Previously managed on Plaquenil, patient reports that she stopped this due to skin darkening Rheumatology is recommending Imuran start  Plan: Start Imuran    Return in about 6 weeks (around 04/09/2020), or if symptoms worsen or fail to improve, for Follow up with Dr. Vaughan Browner, Follow up with Dr. Purnell Shoemaker, ILD clinic - 4min slot.   Lauraine Rinne, NP 02/27/2020   This appointment required 45 minutes of patient care (this includes precharting, chart review, review of results, face-to-face care, etc.).

## 2020-02-27 NOTE — Assessment & Plan Note (Signed)
Plan: Trial of Stiolto Respimat Sputum cultures-3 ways

## 2020-02-27 NOTE — Assessment & Plan Note (Signed)
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

## 2020-02-27 NOTE — Assessment & Plan Note (Signed)
Plan: Continue follow-up with ENT Continue Astelin nasal spray Start nasal saline rinses Start loratadine

## 2020-02-28 ENCOUNTER — Other Ambulatory Visit: Payer: Medicare PPO

## 2020-02-28 DIAGNOSIS — J479 Bronchiectasis, uncomplicated: Secondary | ICD-10-CM | POA: Diagnosis not present

## 2020-03-05 LAB — FUNGUS CULTURE W SMEAR
MICRO NUMBER:: 11486337
SPECIMEN QUALITY:: ADEQUATE

## 2020-03-05 LAB — RESPIRATORY CULTURE OR RESPIRATORY AND SPUTUM CULTURE: SPECIMEN QUALITY:: ADEQUATE

## 2020-03-09 DIAGNOSIS — E663 Overweight: Secondary | ICD-10-CM | POA: Diagnosis not present

## 2020-03-09 DIAGNOSIS — D890 Polyclonal hypergammaglobulinemia: Secondary | ICD-10-CM | POA: Diagnosis not present

## 2020-03-09 DIAGNOSIS — M2550 Pain in unspecified joint: Secondary | ICD-10-CM | POA: Diagnosis not present

## 2020-03-09 DIAGNOSIS — D638 Anemia in other chronic diseases classified elsewhere: Secondary | ICD-10-CM | POA: Diagnosis not present

## 2020-03-09 DIAGNOSIS — J849 Interstitial pulmonary disease, unspecified: Secondary | ICD-10-CM | POA: Diagnosis not present

## 2020-03-09 DIAGNOSIS — M3500 Sicca syndrome, unspecified: Secondary | ICD-10-CM | POA: Diagnosis not present

## 2020-03-09 DIAGNOSIS — Z6827 Body mass index (BMI) 27.0-27.9, adult: Secondary | ICD-10-CM | POA: Diagnosis not present

## 2020-03-12 ENCOUNTER — Other Ambulatory Visit: Payer: Self-pay | Admitting: Pulmonary Disease

## 2020-03-12 MED ORDER — LEVOFLOXACIN 500 MG PO TABS: 500.0000 mg | ORAL_TABLET | Freq: Every day | ORAL | 0 refills | Status: DC

## 2020-03-16 DIAGNOSIS — R634 Abnormal weight loss: Secondary | ICD-10-CM | POA: Diagnosis not present

## 2020-03-16 DIAGNOSIS — D680 Von Willebrand's disease: Secondary | ICD-10-CM | POA: Diagnosis not present

## 2020-03-16 DIAGNOSIS — F411 Generalized anxiety disorder: Secondary | ICD-10-CM | POA: Diagnosis not present

## 2020-03-16 DIAGNOSIS — D649 Anemia, unspecified: Secondary | ICD-10-CM | POA: Diagnosis not present

## 2020-03-16 DIAGNOSIS — R1012 Left upper quadrant pain: Secondary | ICD-10-CM | POA: Diagnosis not present

## 2020-03-16 DIAGNOSIS — R110 Nausea: Secondary | ICD-10-CM | POA: Diagnosis not present

## 2020-03-16 DIAGNOSIS — R194 Change in bowel habit: Secondary | ICD-10-CM | POA: Diagnosis not present

## 2020-03-16 DIAGNOSIS — K5900 Constipation, unspecified: Secondary | ICD-10-CM | POA: Diagnosis not present

## 2020-03-19 DIAGNOSIS — R7309 Other abnormal glucose: Secondary | ICD-10-CM | POA: Diagnosis not present

## 2020-03-19 DIAGNOSIS — R5382 Chronic fatigue, unspecified: Secondary | ICD-10-CM | POA: Diagnosis not present

## 2020-03-20 ENCOUNTER — Other Ambulatory Visit: Payer: Self-pay

## 2020-03-20 ENCOUNTER — Ambulatory Visit (INDEPENDENT_AMBULATORY_CARE_PROVIDER_SITE_OTHER): Payer: Medicare PPO | Admitting: Primary Care

## 2020-03-20 ENCOUNTER — Telehealth: Payer: Self-pay | Admitting: Emergency Medicine

## 2020-03-20 DIAGNOSIS — J154 Pneumonia due to other streptococci: Secondary | ICD-10-CM

## 2020-03-20 DIAGNOSIS — R350 Frequency of micturition: Secondary | ICD-10-CM | POA: Diagnosis not present

## 2020-03-20 MED ORDER — LEVOFLOXACIN 500 MG PO TABS: 500.0000 mg | ORAL_TABLET | Freq: Every day | ORAL | 0 refills | Status: DC

## 2020-03-20 NOTE — Telephone Encounter (Signed)
Call returned to patient, confirmed DOB. She reports she recently had a CT and on her my chart results she reports she was told she has pneumonia. She reports she developed   fevers once taking the antibiotic of 101.7 at the highest. She also reports having increased coughing with streaks of blood in her sputum. She is asking that MD go over in detail what her CT and sputum results were. She reports she is very anxious and she does not know what all this means and whether or not this will be her new normal. Patient asking if the pneumonia will kill her. I offered encouragement making her aware if she were in danger of dying the proper measures would have been taken to address this and she is not actively dying from pneumonia. Patient voiced thanks as she is very worried. Pt requesting an appt today. She inquired about whether antiobiotics maker your stomach hurt. I explained they can give you a upset stomach so it is best to take with food and water. Voiced understanding. Appt made and confirmed with patient.   Nothing further needed at this time.

## 2020-03-20 NOTE — Progress Notes (Signed)
Virtual Visit via Telephone Note  I connected with Brandy Medina on 03/20/20 at  3:45 PM EST by telephone and verified that I am speaking with the correct person using two identifiers.  Location: Patient: Home Provider: Office   I discussed the limitations, risks, security and privacy concerns of performing an evaluation and management service by telephone and the availability of in person appointments. I also discussed with the patient that there may be a patient responsible charge related to this service. The patient expressed understanding and agreed to proceed.   History of Present Illness: 70 year old female, former smoker quit 1995 (9-pack-year history).  Past medical history significant for interstitial lung disease, obstructive bronchiectasis.  Patient of Dr. Lamonte Sakai, last seen in office by pulmonary nurse practitioner on 02/27/2020.  She was given trial Stiolto Respimat and ordered for sputum cultures.  CT chest on 02/20/2020 showed moderate to severe pulmonary fibrosis, substantially worsened compared to December 2010.  Pattern consistent with UIP.  She is established with Dr. Amil Amen with rheumatology for Sjogren's syndrome.  She was previously managed on Plaquenil but stopped due to skin darkening symptoms.  There is consideration for starting patient on Imuran.    03/20/2020 Patient contacted today for telephone visit/cough. She is wanting to go over CT results from January and sputum culture. CT chest 02/20/20 showed moderate to severe pulmonary fibrosis substantially worse compared to December 2010.  Pattern consistent with UIP. Superimposed dense ground glass and heterogeneous opacity peripheral right upper lobe. Sputum culture grew out strep pneumonia. She was called in prescription for Levaquin 500mg  qd x 7 days. She finished this two days ago on 03/18/20. She started getting low grade temp 2-3 days ago. Tmax 101.7 yesterday. Mostly low grade 99.0 (Of note, she is using weighted blankets  at home). She has some associated chills and continues to have productive cough with thick beige to clear mucus. She started Imuran 3-4 weeks ago. She has some urinary frequency symptoms. She does drink a lot of water. No significant shortness of breath symptoms or respiratory distress. She took home covid test which was negative. She has received covid vaccines, including booster.     Observations/Objective:  - Patient was able to speak in full sentences; no overt shortness of breath. Occasional dry cough.   02/20/2020-CT chest without contrast-moderate to severe pulmonary fibrosis in the pattern with a slight apical to basilar gradient although generally heterogeneous distribution with very severe fibrosis and bronchiectasis in certain areas.  Findings are substantially worsened compared to prior exam on 01/15/2009.  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  There is a superimposed dense groundglass and heterogeneous opacity in the peripheral right upper lobe which may reflect acute infection or inflammation.  Recommend follow-up CT in 3 months to ensure resolution.   06/29/2019-pulmonary function test-FVC 2.94 (118% addicted), postbronchodilator ratio 52, postbronchodilator FEV1 1.47 (76% predicted), no bronchodilator response, DLCO 12.61 (63% predicted)   Assessment and Plan:  Streptococcal pneumonia: - CT chest in late January 2022 showed superimposed ground-glass opacity RUL. Sputum culture grew out streptococcus pneumoniae sensitive to Levofloxacin. Previously tried on Amoxicillin without improvement and did not tolerate Doxycyline d/t GI symptoms. She completed 7 day course of Levaquin on 03/18/20. She has been having breakthrough low grade fevers and continues to have congested cough. We will extend Levaquin additional 3 days for total duration of 10 days. Recommend she take Mucinex 1,200mg  twice day and use flutter valve three times a day. Push oral fluids. Take Tylenol  650mg  q 4-6 hours prn fever.   ILD: - Likely d/t underlying sjogren's - Following with Rheumatology and recently started on Imuran  - She has an apt with Dr. Vaughan Browner on 04/06/20 with PFTs prior    Follow Up Instructions:   - As scheduled or sooner if needed  I discussed the assessment and treatment plan with the patient. The patient was provided an opportunity to ask questions and all were answered. The patient agreed with the plan and demonstrated an understanding of the instructions.   The patient was advised to call back or seek an in-person evaluation if the symptoms worsen or if the condition fails to improve as anticipated.  I provided 30 minutes of non-face-to-face time during this encounter.   Martyn Ehrich, NP

## 2020-03-20 NOTE — Patient Instructions (Signed)
We will extend Levaquin additional 3 days for total duration of 10 days.  Recommend Mucinex 1,200mg  twice day  Use flutter valve three times a day.  Push oral fluids. Take Tylenol 650mg  q 4-6 hours prn fever. Follow up scheduled with Dr. Vaughan Browner on 04/06/20

## 2020-03-22 ENCOUNTER — Encounter: Payer: Self-pay | Admitting: *Deleted

## 2020-03-22 NOTE — Progress Notes (Signed)
Received call from [facility] with [facility] stating pt is scheduled for EGD and coloscopy.  Requesting advice from MD if pt needs to proceed with Intranasal DDAVP prior to surgery and if this is going to need to be one in patient or outpatient.  Upon further investigating, pt has not been seen in our office since 2017.  At that time pt was referred to rheumatology. MD at this practice was not the prescribing doctor for the DDAVP.  Real Cons we will not be able to clear the patient for surgery and they will need to contact ordering provider or pt PCP.

## 2020-03-27 DIAGNOSIS — D649 Anemia, unspecified: Secondary | ICD-10-CM | POA: Diagnosis not present

## 2020-03-29 ENCOUNTER — Telehealth: Payer: Self-pay | Admitting: Hematology and Oncology

## 2020-03-29 LAB — RESPIRATORY CULTURE OR RESPIRATORY AND SPUTUM CULTURE: MICRO NUMBER:: 11481420

## 2020-03-29 LAB — FUNGUS CULTURE W SMEAR
CULTURE:: NO GROWTH
SMEAR:: NONE SEEN

## 2020-03-29 NOTE — Telephone Encounter (Signed)
Received a new hem referral from [facility] for Von Willebrands disease, Iron deficiency anemia. [name] has been cld and sheduled to see Dr. Lindi Adie on 3/22 at 11am. Pt aware to arrive 20 minutes early.

## 2020-04-02 ENCOUNTER — Other Ambulatory Visit (HOSPITAL_COMMUNITY)
Admission: RE | Admit: 2020-04-02 | Discharge: 2020-04-02 | Disposition: A | Discharge status: Home / Self Care | Payer: Medicare PPO | Source: Ambulatory Visit | Attending: Pulmonary Disease | Admitting: Pulmonary Disease

## 2020-04-02 DIAGNOSIS — Z20822 Contact with and (suspected) exposure to covid-19: Secondary | ICD-10-CM | POA: Diagnosis not present

## 2020-04-02 DIAGNOSIS — Z01812 Encounter for preprocedural laboratory examination: Secondary | ICD-10-CM | POA: Insufficient documentation

## 2020-04-02 LAB — SARS CORONAVIRUS 2 (TAT 6-24 HRS): SARS Coronavirus 2: NEGATIVE

## 2020-04-03 DIAGNOSIS — I10 Essential (primary) hypertension: Secondary | ICD-10-CM | POA: Diagnosis not present

## 2020-04-05 ENCOUNTER — Other Ambulatory Visit: Payer: Self-pay

## 2020-04-05 ENCOUNTER — Ambulatory Visit (INDEPENDENT_AMBULATORY_CARE_PROVIDER_SITE_OTHER): Payer: Medicare PPO | Admitting: Pulmonary Disease

## 2020-04-05 DIAGNOSIS — J849 Interstitial pulmonary disease, unspecified: Secondary | ICD-10-CM

## 2020-04-05 LAB — PULMONARY FUNCTION TEST
DL/VA % pred: 55 %
DL/VA: 2.29 ml/min/mmHg/L
DLCO cor % pred: 41 %
DLCO cor: 8.23 ml/min/mmHg
DLCO unc % pred: 41 %
DLCO unc: 8.23 ml/min/mmHg
FEF 25-75 Pre: 2.32 L/sec
FEF2575-%Pred-Pre: 131 %
FEV1-%Pred-Pre: 114 %
FEV1-Pre: 2.18 L
FEV1FVC-%Pred-Pre: 105 %
FEV6-%Pred-Pre: 109 %
FEV6-Pre: 2.58 L
FEV6FVC-%Pred-Pre: 103 %
FVC-%Pred-Pre: 108 %
FVC-Pre: 2.66 L
Pre FEV1/FVC ratio: 82 %
Pre FEV6/FVC Ratio: 100 %

## 2020-04-05 NOTE — Progress Notes (Signed)
Spirometry and Dlco done today. 

## 2020-04-06 ENCOUNTER — Encounter: Payer: Self-pay | Admitting: Pulmonary Disease

## 2020-04-06 ENCOUNTER — Ambulatory Visit: Payer: Medicare PPO | Admitting: Pulmonary Disease

## 2020-04-06 VITALS — BP 126/64 | HR 94 | Temp 97.3°F | Ht 64.0 in | Wt 153.2 lb

## 2020-04-06 DIAGNOSIS — J849 Interstitial pulmonary disease, unspecified: Secondary | ICD-10-CM

## 2020-04-06 DIAGNOSIS — R0602 Shortness of breath: Secondary | ICD-10-CM

## 2020-04-06 DIAGNOSIS — D509 Iron deficiency anemia, unspecified: Secondary | ICD-10-CM | POA: Diagnosis not present

## 2020-04-06 LAB — COMPREHENSIVE METABOLIC PANEL
ALT: 131 U/L — ABNORMAL HIGH (ref 0–35)
AST: 143 U/L — ABNORMAL HIGH (ref 0–37)
Albumin: 3.5 g/dL (ref 3.5–5.2)
Alkaline Phosphatase: 370 U/L — ABNORMAL HIGH (ref 39–117)
BUN: 13 mg/dL (ref 6–23)
CO2: 27 mEq/L (ref 19–32)
Calcium: 9.7 mg/dL (ref 8.4–10.5)
Chloride: 97 mEq/L (ref 96–112)
Creatinine, Ser: 0.99 mg/dL (ref 0.40–1.20)
GFR: 58.14 mL/min — ABNORMAL LOW (ref >60.00)
Glucose, Bld: 107 mg/dL — ABNORMAL HIGH (ref 70–99)
Potassium: 4.4 mEq/L (ref 3.5–5.1)
Sodium: 131 mEq/L — ABNORMAL LOW (ref 135–145)
Total Bilirubin: 0.4 mg/dL (ref 0.2–1.2)
Total Protein: 9.3 g/dL — ABNORMAL HIGH (ref 6.0–8.3)

## 2020-04-06 LAB — CBC WITH DIFFERENTIAL/PLATELET
Basophils Absolute: 0 10*3/uL (ref 0.0–0.1)
Basophils Relative: 0.2 % (ref 0.0–3.0)
Eosinophils Absolute: 0.1 10*3/uL (ref 0.0–0.7)
Eosinophils Relative: 1.8 % (ref 0.0–5.0)
HCT: 24.2 % — ABNORMAL LOW (ref 36.0–46.0)
Hemoglobin: 8.3 g/dL — ABNORMAL LOW (ref 12.0–15.0)
Lymphocytes Relative: 13.6 % (ref 12.0–46.0)
Lymphs Abs: 0.6 10*3/uL — ABNORMAL LOW (ref 0.7–4.0)
MCHC: 34.4 g/dL (ref 30.0–36.0)
MCV: 91.2 fl (ref 78.0–100.0)
Monocytes Absolute: 0.5 10*3/uL (ref 0.1–1.0)
Monocytes Relative: 12.9 % — ABNORMAL HIGH (ref 3.0–12.0)
Neutro Abs: 3 10*3/uL (ref 1.4–7.7)
Neutrophils Relative %: 71.5 % (ref 43.0–77.0)
Platelets: 479 10*3/uL — ABNORMAL HIGH (ref 150.0–400.0)
RBC: 2.65 Mil/uL — ABNORMAL LOW (ref 3.87–5.11)
RDW: 15.6 % — ABNORMAL HIGH (ref 11.5–15.5)
WBC: 4.2 10*3/uL (ref 4.0–10.5)

## 2020-04-06 NOTE — Progress Notes (Signed)
Brandy Medina    [id number]    1951-01-04  Primary Care Physician:Husain, Denton Ar, MD  Referring Physician: Wenda Low, MD [place] [address] [employer] [address] [place],  [place] [address]  Chief complaint: Consult for interstitial lung disease, Sjogren's syndrome  HPI: Patient with history of von Willebrand's disease, Sjogren's syndrome, maintained on Plaquenil which is stopped due to darkening skin.  Recently started on Imuran by Dr. 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  She has done cushions in the living room couch but exposures is minimal Smoking history: 9  pack-year smoker.  Quit in 1995 Travel history: Originally from [place].  No significant recent travel Relevant family history: No family history of lung disease   Outpatient Encounter Medications as of 04/06/2020  Medication Sig  . acetaminophen (TYLENOL) 325 MG tablet Take 650 mg by mouth every 6 (six) hours as needed for headache.  Marland Kitchen amLODipine (NORVASC) 5 MG tablet 1 tablet  . azaTHIOprine (IMURAN) 50 MG tablet Take 50 mg by mouth in the morning and at bedtime.  . butalbital-acetaminophen-caffeine (FIORICET WITH CODEINE) 50-325-40-30 MG capsule Take 1 capsule by mouth every 8 (eight) hours as needed for headache.  . chlorpheniramine (CHLOR-TRIMETON) 4 MG tablet 1 at bedtime and 1 every 4 hours as needed during the day  . desmopressin (DDAVP NASAL) 0.01 % solution Place 10 mcg into the nose as directed. Instill 0.05 drop in nostril once a day at time of procedures/surgery  . dextromethorphan (DELSYM) 30 MG/5ML liquid 2 every 12 hours as  needed  . Ergocalciferol (VITAMIN D2) 2000 units TABS Take 1 tablet by mouth 2 (two) times a week.  . losartan (COZAAR) 100 MG tablet Take 1 tablet by mouth daily.  . metFORMIN (GLUCOPHAGE) 500 MG tablet Take 500 mg by mouth daily with breakfast.   . pilocarpine (SALAGEN) 5 MG tablet Take 5 mg by mouth in the morning, at noon, and at bedtime.  Marland Kitchen Respiratory Therapy Supplies (FLUTTER) DEVI Use as directed.  . fluticasone (FLONASE) 50 MCG/ACT nasal spray Place 2 sprays into both nostrils daily as needed for allergies or rhinitis. (Patient not taking: No sig reported)  . Tiotropium Bromide-Olodaterol (STIOLTO RESPIMAT) 2.5-2.5 MCG/ACT AERS Inhale 2 puffs into the lungs daily. (Patient not taking: Reported on 04/06/2020)  . [DISCONTINUED] levofloxacin (LEVAQUIN) 500 MG tablet Take 1 tablet (500 mg total) by mouth daily. For additional 3 days  . [DISCONTINUED] triamterene-hydrochlorothiazide (DYAZIDE) 37.5-25 MG capsule Take 1 capsule by mouth daily. 1/2 tab MWF   No facility-administered encounter medications on file as of 04/06/2020.    Allergies as of 04/06/2020 - Review Complete 04/06/2020  Allergen Reaction Noted  . Aspirin    . Avelox [moxifloxacin hcl in nacl] Other (See Comments) 07/06/2014  . Codeine    . Erythromycin  10/02/2015  . Lisinopril    . Sulfonamide derivatives      Past Medical History:  Diagnosis Date  . Allergic rhinitis   . Diabetes ([place])   . HTN (hypertension)   . Migraines     Past Surgical History:  Procedure Laterality Date  . ABDOMINAL HYSTERECTOMY    .  BREAST SURGERY      Family History  Problem Relation Age of Onset  . Renal Disease Brother   . Stroke Brother   . Bronchitis Mother   . Heart disease Mother   . Heart disease Father   . Heart attack Father   . Hypertension Sister   . Diabetes Sister   . Heart attack Brother   . Hypertension Sister   . Diabetes Sister     Social History   Socioeconomic History  . Marital status: Married     Spouse name: Not on file  . Number of children: Not on file  . Years of education: Not on file  . Highest education level: Not on file  Occupational History  . Not on file  Tobacco Use  . Smoking status: Former Smoker    Packs/day: 0.50    Years: 18.00    Pack years: 9.00    Types: Cigarettes    Quit date: 07/05/1993    Years since quitting: 26.7  . Smokeless tobacco: Never Used  Substance and Sexual Activity  . Alcohol use: Not on file  . Drug use: Not on file  . Sexual activity: Not on file  Other Topics Concern  . Not on file  Social History Narrative  . Not on file   Social Determinants of Health   Financial Resource Strain: Not on file  Food Insecurity: Not on file  Transportation Needs: Not on file  Physical Activity: Not on file  Stress: Not on file  Social Connections: Not on file  Intimate Partner Violence: Not on file    Review of systems: Review of Systems  Constitutional: Negative for fever and chills.  HENT: Negative.   Eyes: Negative for blurred vision.  Respiratory: as per HPI  Cardiovascular: Negative for chest pain and palpitations.  Gastrointestinal: Negative for vomiting, diarrhea, blood per rectum. Genitourinary: Negative for dysuria, urgency, frequency and hematuria.  Musculoskeletal: Negative for myalgias, back pain and joint pain.  Skin: Negative for itching and rash.  Neurological: Negative for dizziness, tremors, focal weakness, seizures and loss of consciousness.  Endo/Heme/Allergies: Negative for environmental allergies.  Psychiatric/Behavioral: Negative for depression, suicidal ideas and hallucinations.  All other systems reviewed and are negative.  Physical Exam: Blood pressure 126/64, pulse 94, temperature (!) 97.3 F (36.3 C), temperature source Temporal, height 5\' 4"  (1.626 m), weight 153 lb 3.2 oz (69.5 kg), SpO2 99 %. 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  Worsened since previous scan in 2010, groundglass opacities in the peripheral right upper lobe.  I have reviewed the images personally.  PFTs: 04/05/2020 FVC 2.66 [108%], FEV1 2.18 [114%], DLCO 8.23 [41%] Severe diffusion defect  Labs:   Assessment:  UIP fibrosis Sjogren's syndrome She has progressive fibrosis as noted on the CT scan and worsening PFTs compared to 2016 in 2021 Already on Imuran per Dr. Amil Amen, rheumatology We discussed use of antifibrotic therapy and she has  agreed to start  Avoid Ofev due to history of von Willebrand's disease and bleeding diastasis Paperwork started for Ofev Check baseline labs including CMP, CBC and proBNP  Plan/Recommendations: Start paperwork for Esbriet Check baseline labs, proBNP today  Marshell Garfinkel MD  Pulmonary and Critical Care 04/06/2020, 9:07 AM  CC: Wenda Low, MD

## 2020-04-06 NOTE — Patient Instructions (Signed)
We have reviewed your CT scan and imaging which shows a condition called pulmonary fibrosis or scarring in the lung.  This is a condition due to Sjogren's that can affect the lung This is a progressive disease We have discussed treatment options to slow the scarring process and decided to start you on a medication called Esbriet  Check comprehensive metabolic panel, CBC and proBNP [for dyspnea] for baseline evaluation Follow-up in 1 to 2 months.

## 2020-04-07 LAB — PRO B NATRIURETIC PEPTIDE: NT-Pro BNP: 132 pg/mL (ref 0–301)

## 2020-04-07 NOTE — Progress Notes (Signed)
Cardiology Office Note:    Date:  04/09/2020   ID:  Brandy Medina, DOB 1950/06/01, MRN [id number]  PCP:  Wenda Low, MD  Cardiologist:  No primary care provider on file.   Referring MD: Wenda Low, MD   Chief Complaint  Patient presents with  . Chest Pain  . Shortness of Breath    History of Present Illness:    Brandy Medina is a 70 y.o. female with a hx of DM II, primary hypertension, Sjogren's,  ILD, anemia, migraines and recent onset chest pain. Referred by Dr. Lysle Rubens.  When last seen by Dr. Deforest Hoyles in January, the patient was having right and left chest discomfort.  During that time she was having a lot of coughing.  The discomfort was not exertional related.  It would be aggravated by coughing.  She has also been experiencing dyspnea on exertion and fatigue.  Recent evaluations have demonstrated chronic anemia with a recent decline to hemoglobin level of around 8.  She gives out if she walks too far or walks too fast.  Physical activity is not associated with chest discomfort.  She does not smoke, is not diabetic, does have hypertension, and does have a family history of CAD (father) and has a chronic inflammatory disease process.  Past Medical History:  Diagnosis Date  . Allergic rhinitis   . Diabetes ([place])   . HTN (hypertension)   . Migraines     Past Surgical History:  Procedure Laterality Date  . ABDOMINAL HYSTERECTOMY    . BREAST SURGERY      Current Medications: Current Meds  Medication Sig  . acetaminophen (TYLENOL) 325 MG tablet Take 650 mg by mouth every 6 (six) hours as needed for headache.  Marland Kitchen amLODipine (NORVASC) 5 MG tablet Take 5 mg by mouth daily.  Marland Kitchen azaTHIOprine (IMURAN) 50 MG tablet Take 50 mg by mouth in the morning and at bedtime.  . butalbital-acetaminophen-caffeine (FIORICET WITH CODEINE) 50-325-40-30 MG capsule Take 1 capsule by mouth every 8 (eight) hours as needed for headache.  . chlorpheniramine (CHLOR-TRIMETON) 4 MG tablet 1 at  bedtime and 1 every 4 hours as needed during the day  . desmopressin (DDAVP NASAL) 0.01 % solution Place 10 mcg into the nose as directed. Instill 0.05 drop in nostril once a day at time of procedures/surgery  . dextromethorphan (DELSYM) 30 MG/5ML liquid 2 every 12 hours as needed  . Ergocalciferol (VITAMIN D2) 2000 units TABS Take 1 tablet by mouth 2 (two) times a week.  . ferrous sulfate 325 (65 FE) MG tablet Take 325 mg by mouth in the morning and at bedtime.  . folic acid (FOLVITE) 761 MCG tablet Take 400 mcg by mouth daily.  Marland Kitchen losartan (COZAAR) 100 MG tablet Take 1 tablet by mouth daily.  . metFORMIN (GLUCOPHAGE) 500 MG tablet Take 500 mg by mouth daily with breakfast.   . pilocarpine (SALAGEN) 5 MG tablet Take 5 mg by mouth in the morning, at noon, and at bedtime.  Marland Kitchen Respiratory Therapy Supplies (FLUTTER) DEVI Use as directed.     Allergies:   Aspirin, Avelox [moxifloxacin hcl in nacl], Codeine, Erythromycin, Lisinopril, and Sulfonamide derivatives   Social History   Socioeconomic History  . Marital status: Married    Spouse name: Not on file  . Number of children: Not on file  . Years of education: Not on file  . Highest education level: Not on file  Occupational History  . Not on file  Tobacco Use  .  Smoking status: Former Smoker    Packs/day: 0.50    Years: 18.00    Pack years: 9.00    Types: Cigarettes    Quit date: 07/05/1993    Years since quitting: 26.7  . Smokeless tobacco: Never Used  Substance and Sexual Activity  . Alcohol use: Not on file  . Drug use: Not on file  . Sexual activity: Not on file  Other Topics Concern  . Not on file  Social History Narrative  . Not on file   Social Determinants of Health   Financial Resource Strain: Not on file  Food Insecurity: Not on file  Transportation Needs: Not on file  Physical Activity: Not on file  Stress: Not on file  Social Connections: Not on file     Family History: The patient's family history includes  Bronchitis in her mother; Diabetes in her sister and sister; Heart attack in her brother and father; Heart disease in her father and mother; Hypertension in her sister and sister; Renal Disease in her brother; Stroke in her brother.  ROS:   Please see the history of present illness.    Multiple aches and pains related to Sjogren's.  Chronic shortness of breath with CT demonstrated interstitial lung disease/pulmonary fibrosis.  She has von Willebrand's disease but has not had any active bleeding.  All other systems reviewed and are negative.  EKGs/Labs/Other Studies Reviewed:    The following studies were reviewed today:  2D Doppler echocardiogram 2017: Study Conclusions   - Left ventricle: The cavity size was normal. Wall thickness was  normal. Systolic function was normal. The estimated ejection  fraction was in the range of 60% to 65%. Doppler parameters are  consistent with abnormal left ventricular relaxation (grade 1  diastolic dysfunction).  - Aortic valve: There was no stenosis.  - Mitral valve: There was trivial regurgitation.  - Left atrium: The atrium was mildly dilated.  - Right ventricle: The cavity size was normal. Systolic function  was normal.  - Tricuspid valve: Peak RV-RA gradient (S): 27 mm Hg.  - Pulmonary arteries: PA peak pressure: 30 mm Hg (S).  - Inferior vena cava: The vessel was normal in size. The  respirophasic diameter changes were in the normal range (= 50%),  consistent with normal central venous pressure.   Impressions:   - Normal LV size with EF 60-65%. Normal RV size and systolic  function. No significant valvular abnormalities.    EKG:  EKG sinus tachycardia at 101 bpm, left anterior hemiblock, right bundle branch block.  Has history of right bundle but no prior tracing is available for review.  Recent Labs: 04/06/2020: ALT 131; BUN 13; Creatinine, Ser 0.99; Hemoglobin 8.3 Repeated and verified X2.; NT-Pro BNP 132; Platelets  479.0; Potassium 4.4; Sodium 131  Recent Lipid Panel No results found for: CHOL, TRIG, HDL, CHOLHDL, VLDL, LDLCALC, LDLDIRECT  Physical Exam:    VS:  BP 138/80   Pulse (!) 101   Ht 5\' 4"  (1.626 m)   Wt 152 lb 3.2 oz (69 kg)   SpO2 97%   BMI 26.13 kg/m     Wt Readings from Last 3 Encounters:  04/09/20 152 lb 3.2 oz (69 kg)  04/06/20 153 lb 3.2 oz (69.5 kg)  02/27/20 168 lb (76.2 kg)     GEN: Healthy-appearing.. No acute distress HEENT: Normal NECK: No JVD. LYMPHATICS: No lymphadenopathy CARDIAC: No murmur. RRR S4 gallop, or edema. VASCULAR:  Normal Pulses. No bruits. RESPIRATORY:  Clear to auscultation without  rales, wheezing or rhonchi  ABDOMEN: Soft, non-tender, non-distended, No pulsatile mass, MUSCULOSKELETAL: No deformity  SKIN: Warm and dry NEUROLOGIC:  Alert and oriented x 3 PSYCHIATRIC:  Normal affect   ASSESSMENT:    1. Chest pain of uncertain etiology   2. Primary hypertension   3. Controlled type 2 diabetes mellitus with other circulatory complication, without long-term current use of insulin ([place])   4. RBBB   5. ILD (interstitial lung disease) ([place])   6. Sjogren's syndrome, with unspecified organ involvement ([place])   7. Pulmonary fibrosis ([place])   8. Shortness of breath    PLAN:    In order of problems listed above:  1. Chest pain sounds musculoskeletal, possibly related to coughing.  A coronary calcium score will be done to exclude high risk.  If high risk calcium is identified, may need to have a functional study.  Atherosclerosis was noted on the recent CT but no mention of coronary calcification. 2. Blood pressure is under excellent control on current medical regimen. 3. Takes Metformin as needed based upon blood sugars. 4. Prior history of right bundle but also has evidence of left anterior hemiblock.  This is asymptomatic. 5. No evidence of right heart failure.  Neck veins are flat.  2D Doppler echocardiogram will be done to assess underlying right  heart function and exclude pericardial disease 6. Being managed by Dr. Amil Amen 7. Significant based upon recent CT report. 8. Multifactorial related to significant anemia and pulmonary fibrosis.  Echo will help make sure there is not a cardiac component.  Overall education and awareness concerning secondary risk prevention was discussed in detail: LDL less than 70, hemoglobin A1c less than 7, blood pressure target less than 130/80 mmHg, >150 minutes of moderate aerobic activity per week, avoidance of smoking, weight control (via diet and exercise), and continued surveillance/management of/for obstructive sleep apnea.    Medication Adjustments/Labs and Tests Ordered: Current medicines are reviewed at length with the patient today.  Concerns regarding medicines are outlined above.  Orders Placed This Encounter  Procedures  . CT CARDIAC SCORING (SELF PAY ONLY)  . EKG 12-Lead  . ECHOCARDIOGRAM COMPLETE   No orders of the defined types were placed in this encounter.   Patient Instructions  Medication Instructions:  Your physician recommends that you continue on your current medications as directed. Please refer to the Current Medication list given to you today.  *If you need a refill on your cardiac medications before your next appointment, please call your pharmacy*   Lab Work: None If you have labs (blood work) drawn today and your tests are completely normal, you will receive your results only by: Marland Kitchen MyChart Message (if you have MyChart) OR . A paper copy in the mail If you have any lab test that is abnormal or we need to change your treatment, we will call you to review the results.   Testing/Procedures: Your physician recommends that you have a Calcium Score performed.  Your physician has requested that you have an echocardiogram. Echocardiography is a painless test that uses sound waves to create images of your heart. It provides your doctor with information about the size and  shape of your heart and how well your heart's chambers and valves are working. This procedure takes approximately one hour. There are no restrictions for this procedure.   Follow-Up: At [facility], you and your health needs are our priority.  As part of our continuing mission to provide you with exceptional heart care, we have  created designated Provider Care Teams.  These Care Teams include your primary Cardiologist (physician) and Advanced Practice Providers (APPs -  Physician Assistants and Nurse Practitioners) who all work together to provide you with the care you need, when you need it.  We recommend signing up for the patient portal called "MyChart".  Sign up information is provided on this After Visit Summary.  MyChart is used to connect with patients for Virtual Visits (Telemedicine).  Patients are able to view lab/test results, encounter notes, upcoming appointments, etc.  Non-urgent messages can be sent to your provider as well.   To learn more about what you can do with MyChart, go to [email or web address].    Your next appointment:   As needed  The format for your next appointment:   In Person  Provider:   You may see Daneen Schick, MD or one of the following Advanced Practice Providers on your designated Care Team:    Kathyrn Drown, NP    Other Instructions      Signed, Sinclair Grooms, MD  04/09/2020 10:21 AM    [place]

## 2020-04-09 ENCOUNTER — Ambulatory Visit: Payer: Medicare PPO | Admitting: Interventional Cardiology

## 2020-04-09 ENCOUNTER — Encounter: Payer: Self-pay | Admitting: Interventional Cardiology

## 2020-04-09 ENCOUNTER — Other Ambulatory Visit: Payer: Self-pay

## 2020-04-09 VITALS — BP 138/80 | HR 101 | Ht 64.0 in | Wt 152.2 lb

## 2020-04-09 DIAGNOSIS — R0602 Shortness of breath: Secondary | ICD-10-CM

## 2020-04-09 DIAGNOSIS — I10 Essential (primary) hypertension: Secondary | ICD-10-CM | POA: Diagnosis not present

## 2020-04-09 DIAGNOSIS — I4510 Unspecified right bundle-branch block: Secondary | ICD-10-CM

## 2020-04-09 DIAGNOSIS — D509 Iron deficiency anemia, unspecified: Secondary | ICD-10-CM | POA: Diagnosis not present

## 2020-04-09 DIAGNOSIS — R079 Chest pain, unspecified: Secondary | ICD-10-CM

## 2020-04-09 DIAGNOSIS — M3500 Sicca syndrome, unspecified: Secondary | ICD-10-CM | POA: Diagnosis not present

## 2020-04-09 DIAGNOSIS — E1159 Type 2 diabetes mellitus with other circulatory complications: Secondary | ICD-10-CM

## 2020-04-09 DIAGNOSIS — J8410 Pulmonary fibrosis, unspecified: Secondary | ICD-10-CM

## 2020-04-09 DIAGNOSIS — J849 Interstitial pulmonary disease, unspecified: Secondary | ICD-10-CM | POA: Diagnosis not present

## 2020-04-09 NOTE — Patient Instructions (Signed)
Medication Instructions:  Your physician recommends that you continue on your current medications as directed. Please refer to the Current Medication list given to you today.  *If you need a refill on your cardiac medications before your next appointment, please call your pharmacy*   Lab Work: None If you have labs (blood work) drawn today and your tests are completely normal, you will receive your results only by: Marland Kitchen MyChart Message (if you have MyChart) OR . A paper copy in the mail If you have any lab test that is abnormal or we need to change your treatment, we will call you to review the results.   Testing/Procedures: Your physician recommends that you have a Calcium Score performed.  Your physician has requested that you have an echocardiogram. Echocardiography is a painless test that uses sound waves to create images of your heart. It provides your doctor with information about the size and shape of your heart and how well your heart's chambers and valves are working. This procedure takes approximately one hour. There are no restrictions for this procedure.   Follow-Up: At [facility], you and your health needs are our priority.  As part of our continuing mission to provide you with exceptional heart care, we have created designated Provider Care Teams.  These Care Teams include your primary Cardiologist (physician) and Advanced Practice Providers (APPs -  Physician Assistants and Nurse Practitioners) who all work together to provide you with the care you need, when you need it.  We recommend signing up for the patient portal called "MyChart".  Sign up information is provided on this After Visit Summary.  MyChart is used to connect with patients for Virtual Visits (Telemedicine).  Patients are able to view lab/test results, encounter notes, upcoming appointments, etc.  Non-urgent messages can be sent to your provider as well.   To learn more about what you can do with MyChart, go to  [email or web address].    Your next appointment:   As needed  The format for your next appointment:   In Person  Provider:   You may see Daneen Schick, MD or one of the following Advanced Practice Providers on your designated Care Team:    Kathyrn Drown, NP    Other Instructions

## 2020-04-10 ENCOUNTER — Telehealth: Payer: Self-pay | Admitting: Pharmacy Technician

## 2020-04-10 NOTE — Telephone Encounter (Signed)
Received New start paperwork for ESBRIET. Will update as we work through the benefits process.  Initiate Prior Authorization for [employer]. Awaiting clinical questions to update.  Key: [id number]

## 2020-04-11 NOTE — Telephone Encounter (Signed)
Called [employer] to complete PA questions via the phone.  Submitted a Prior Authorization request to [facility] for [place] via Cover My Meds. Will update once we receive a response.  PA# [id number]  Phone# [phone]  Patient has been approved for PF copay grant through The Assistance fund. Approved through 01/28/20 through 01/26/21.   Processing information:  PCN: AS BIN: [id number] Member Number: [id number] Group Number: [id number]   Rx helpdesk Phone: [phone]  Program Phone Number- [phone]

## 2020-04-13 ENCOUNTER — Other Ambulatory Visit: Payer: Self-pay | Admitting: Gastroenterology

## 2020-04-13 ENCOUNTER — Encounter: Payer: Self-pay | Admitting: *Deleted

## 2020-04-13 DIAGNOSIS — R194 Change in bowel habit: Secondary | ICD-10-CM | POA: Diagnosis not present

## 2020-04-13 DIAGNOSIS — K219 Gastro-esophageal reflux disease without esophagitis: Secondary | ICD-10-CM | POA: Diagnosis not present

## 2020-04-13 DIAGNOSIS — K589 Irritable bowel syndrome without diarrhea: Secondary | ICD-10-CM | POA: Diagnosis not present

## 2020-04-13 DIAGNOSIS — D680 Von Willebrand's disease: Secondary | ICD-10-CM | POA: Diagnosis not present

## 2020-04-13 DIAGNOSIS — D509 Iron deficiency anemia, unspecified: Secondary | ICD-10-CM | POA: Diagnosis not present

## 2020-04-13 DIAGNOSIS — R110 Nausea: Secondary | ICD-10-CM | POA: Diagnosis not present

## 2020-04-13 LAB — AFB CULTURE WITH SMEAR (NOT AT ARMC)
Acid Fast Culture: NEGATIVE
Acid Fast Smear: NEGATIVE

## 2020-04-13 NOTE — Telephone Encounter (Signed)
Returned call, and answered additional questions. We should receive a fax determination by Monday.

## 2020-04-13 NOTE — Telephone Encounter (Signed)
Brandy Medina from [employer] with clinical questions for med Esbriet. Reference number is [id number]. Brandy Medina phone number is [phone].

## 2020-04-16 NOTE — Progress Notes (Signed)
[facility] NOTE  Patient Care Team: Wenda Low, MD as PCP - General (Internal Medicine)  CHIEF COMPLAINTS/PURPOSE OF CONSULTATION:  anemia, Von Willebrand's disease  HISTORY OF PRESENTING ILLNESS:  Brandy Medina 70 y.o. female is here because of recent diagnosis of iron deficiency anemia and Von Willebrand's disease. Labs on 03/27/20 showed Hg 7.8, HCT 21.7, platelets 392, iron saturation 12%, and on 04/06/20 showed: Hg 8.3, HCT 24.2, platelets 479. She presents to the clinic today for initial evaluation.  She is scheduled to undergo upper endoscopy and colonoscopy and requires DDAVP prescription.  She was also noted to have profound anemia.  Much worse than previously.  She was diagnosed with Sjogren's disease and is currently on Imuran therapy.  Most recent lab work shows elevation of liver function tests.  I reviewed her records extensively and collaborated the history with the patient.  MEDICAL HISTORY:  Past Medical History:  Diagnosis Date  . Allergic rhinitis   . Diabetes ([place])   . HTN (hypertension)   . Migraines     SURGICAL HISTORY: Past Surgical History:  Procedure Laterality Date  . ABDOMINAL HYSTERECTOMY    . BREAST SURGERY      SOCIAL HISTORY: Social History   Socioeconomic History  . Marital status: Married    Spouse name: Not on file  . Number of children: Not on file  . Years of education: Not on file  . Highest education level: Not on file  Occupational History  . Not on file  Tobacco Use  . Smoking status: Former Smoker    Packs/day: 0.50    Years: 18.00    Pack years: 9.00    Types: Cigarettes    Quit date: 07/05/1993    Years since quitting: 26.8  . Smokeless tobacco: Never Used  Substance and Sexual Activity  . Alcohol use: Not on file  . Drug use: Not on file  . Sexual activity: Not on file  Other Topics Concern  . Not on file  Social History Narrative  . Not on file   Social Determinants of Health   Financial  Resource Strain: Not on file  Food Insecurity: Not on file  Transportation Needs: Not on file  Physical Activity: Not on file  Stress: Not on file  Social Connections: Not on file  Intimate Partner Violence: Not on file    FAMILY HISTORY: Family History  Problem Relation Age of Onset  . Renal Disease Brother   . Stroke Brother   . Bronchitis Mother   . Heart disease Mother   . Heart disease Father   . Heart attack Father   . Hypertension Sister   . Diabetes Sister   . Heart attack Brother   . Hypertension Sister   . Diabetes Sister     ALLERGIES:  is allergic to aspirin, avelox [moxifloxacin hcl in nacl], codeine, erythromycin, lisinopril, and sulfonamide derivatives.  MEDICATIONS:  Current Outpatient Medications  Medication Sig Dispense Refill  . acetaminophen (TYLENOL) 325 MG tablet Take 650 mg by mouth every 6 (six) hours as needed for headache.    Marland Kitchen amLODipine (NORVASC) 5 MG tablet Take 5 mg by mouth daily.    Marland Kitchen azaTHIOprine (IMURAN) 50 MG tablet Take 50 mg by mouth in the morning and at bedtime.    . butalbital-acetaminophen-caffeine (FIORICET WITH CODEINE) 50-325-40-30 MG capsule Take 1 capsule by mouth every 8 (eight) hours as needed for headache.    . chlorpheniramine (CHLOR-TRIMETON) 4 MG tablet 1 at bedtime  and 1 every 4 hours as needed during the day    . desmopressin (DDAVP NASAL) 0.01 % solution Place 1 spray (10 mcg total) into the nose as directed. Instill 0.05 drop in nostril once a day at time of procedures/surgery 5 mL 3  . dextromethorphan (DELSYM) 30 MG/5ML liquid 2 every 12 hours as needed    . Ergocalciferol (VITAMIN D2) 2000 units TABS Take 1 tablet by mouth 2 (two) times a week.    . ferrous sulfate 325 (65 FE) MG tablet Take 325 mg by mouth in the morning and at bedtime.    . folic acid (FOLVITE) 976 MCG tablet Take 400 mcg by mouth daily.    Marland Kitchen losartan (COZAAR) 100 MG tablet Take 1 tablet by mouth daily.    . metFORMIN (GLUCOPHAGE) 500 MG tablet Take  500 mg by mouth daily with breakfast.   0  . pilocarpine (SALAGEN) 5 MG tablet Take 5 mg by mouth in the morning, at noon, and at bedtime.    Marland Kitchen Respiratory Therapy Supplies (FLUTTER) DEVI Use as directed. 1 each 0   No current facility-administered medications for this visit.    REVIEW OF SYSTEMS:   Constitutional: Fatigue and shortness of breath exertion  All other systems were reviewed with the patient and are negative.  PHYSICAL EXAMINATION: ECOG PERFORMANCE STATUS: 1 - Symptomatic but completely ambulatory  Vitals:   04/17/20 1110  BP: 139/84  Pulse: (!) 106  Resp: 18  Temp: (!) 97.5 F (36.4 C)  SpO2: 99%   Filed Weights   04/17/20 1110  Weight: 150 lb 4.8 oz (68.2 kg)      LABORATORY DATA:  I have reviewed the data as listed Lab Results  Component Value Date   WBC 4.9 04/17/2020   HGB 7.9 (L) 04/17/2020   HCT 23.8 (L) 04/17/2020   MCV 92.2 04/17/2020   PLT 388 04/17/2020   Lab Results  Component Value Date   NA 131 (L) 04/06/2020   K 4.4 04/06/2020   CL 97 04/06/2020   CO2 27 04/06/2020       ASSESSMENT AND PLAN:  Von Willebrand disease ([place]) Patient has an apparent history of von Willebrand disease.  (Dr. Brent Bulla)    Plan: 1. Check von Willebrand factor antigen  2. Patient will also need desmopressin nasal inhalation 1 puff in each nostril for her colonoscopy and endoscopy.  I sent a prescription to [facility].  Telephone visit in 1 week to discuss results  Normocytic anemia 04/06/2020: Hemoglobin 8.3, MCV 91.2, platelets 479, WBC 4.2, alkaline phosphatase 370, AST 143, ALT 131, total protein 9.3, creatinine 0.99  Differential diagnosis of the severe normocytic anemia: Recent pneumonia versus Imuran versus underlying bone marrow disorders like MDS.  Plan: Obtain labs which include CBC, CMP, LDH, reticulocyte count, SPEP, iron studies. If all of these tests are normal then we would have to consider doing a bone marrow biopsy.     All questions were answered. The patient knows to call the clinic with any problems, questions or concerns.   Rulon Eisenmenger, MD, MPH 04/17/2020    I, Molly Dorshimer, am acting as scribe for Nicholas Lose, MD.  I have reviewed the above documentation for accuracy and completeness, and I agree with the above.

## 2020-04-17 ENCOUNTER — Other Ambulatory Visit: Payer: Self-pay | Admitting: Hematology and Oncology

## 2020-04-17 ENCOUNTER — Telehealth: Payer: Self-pay | Admitting: Hematology and Oncology

## 2020-04-17 ENCOUNTER — Other Ambulatory Visit: Payer: Self-pay

## 2020-04-17 ENCOUNTER — Inpatient Hospital Stay: Payer: Medicare PPO | Attending: Hematology and Oncology | Admitting: Hematology and Oncology

## 2020-04-17 ENCOUNTER — Inpatient Hospital Stay: Payer: Medicare PPO

## 2020-04-17 VITALS — BP 139/84 | HR 106 | Temp 97.5°F | Resp 18 | Ht 64.0 in | Wt 150.3 lb

## 2020-04-17 DIAGNOSIS — M3500 Sicca syndrome, unspecified: Secondary | ICD-10-CM | POA: Insufficient documentation

## 2020-04-17 DIAGNOSIS — Z7984 Long term (current) use of oral hypoglycemic drugs: Secondary | ICD-10-CM | POA: Diagnosis not present

## 2020-04-17 DIAGNOSIS — D509 Iron deficiency anemia, unspecified: Secondary | ICD-10-CM | POA: Diagnosis not present

## 2020-04-17 DIAGNOSIS — I10 Essential (primary) hypertension: Secondary | ICD-10-CM | POA: Diagnosis not present

## 2020-04-17 DIAGNOSIS — D6800 Von Willebrand disease, unspecified: Secondary | ICD-10-CM

## 2020-04-17 DIAGNOSIS — D649 Anemia, unspecified: Secondary | ICD-10-CM

## 2020-04-17 DIAGNOSIS — Z79899 Other long term (current) drug therapy: Secondary | ICD-10-CM | POA: Diagnosis not present

## 2020-04-17 DIAGNOSIS — E119 Type 2 diabetes mellitus without complications: Secondary | ICD-10-CM | POA: Insufficient documentation

## 2020-04-17 DIAGNOSIS — Z87891 Personal history of nicotine dependence: Secondary | ICD-10-CM | POA: Insufficient documentation

## 2020-04-17 LAB — CBC WITH DIFFERENTIAL (CANCER CENTER ONLY)
Abs Immature Granulocytes: 0.03 10*3/uL (ref 0.00–0.07)
Basophils Absolute: 0 10*3/uL (ref 0.0–0.1)
Basophils Relative: 0 %
Eosinophils Absolute: 0 10*3/uL (ref 0.0–0.5)
Eosinophils Relative: 1 %
HCT: 23.8 % — ABNORMAL LOW (ref 36.0–46.0)
Hemoglobin: 7.9 g/dL — ABNORMAL LOW (ref 12.0–15.0)
Immature Granulocytes: 1 %
Lymphocytes Relative: 10 %
Lymphs Abs: 0.5 10*3/uL — ABNORMAL LOW (ref 0.7–4.0)
MCH: 30.6 pg (ref 26.0–34.0)
MCHC: 33.2 g/dL (ref 30.0–36.0)
MCV: 92.2 fL (ref 80.0–100.0)
Monocytes Absolute: 0.5 10*3/uL (ref 0.1–1.0)
Monocytes Relative: 10 %
Neutro Abs: 3.8 10*3/uL (ref 1.7–7.7)
Neutrophils Relative %: 78 %
Platelet Count: 388 10*3/uL (ref 150–400)
RBC: 2.58 MIL/uL — ABNORMAL LOW (ref 3.87–5.11)
RDW: 16.7 % — ABNORMAL HIGH (ref 11.5–15.5)
WBC Count: 4.9 10*3/uL (ref 4.0–10.5)
nRBC: 0 % (ref 0.0–0.2)

## 2020-04-17 LAB — CMP (CANCER CENTER ONLY)
ALT: 66 U/L — ABNORMAL HIGH (ref 0–44)
AST: 55 U/L — ABNORMAL HIGH (ref 15–41)
Albumin: 3.3 g/dL — ABNORMAL LOW (ref 3.5–5.0)
Alkaline Phosphatase: 496 U/L — ABNORMAL HIGH (ref 38–126)
Anion gap: 10 (ref 5–15)
BUN: 9 mg/dL (ref 8–23)
CO2: 23 mmol/L (ref 22–32)
Calcium: 9.4 mg/dL (ref 8.9–10.3)
Chloride: 98 mmol/L (ref 98–111)
Creatinine: 1 mg/dL (ref 0.44–1.00)
GFR, Estimated: >60 mL/min (ref >60)
Glucose, Bld: 123 mg/dL — ABNORMAL HIGH (ref 70–99)
Potassium: 4.3 mmol/L (ref 3.5–5.1)
Sodium: 131 mmol/L — ABNORMAL LOW (ref 135–145)
Total Bilirubin: 0.4 mg/dL (ref 0.3–1.2)
Total Protein: 9.7 g/dL — ABNORMAL HIGH (ref 6.5–8.1)

## 2020-04-17 LAB — IRON AND TIBC
Iron: 92 ug/dL (ref 41–142)
Saturation Ratios: 33 % (ref 21–57)
TIBC: 275 ug/dL (ref 236–444)
UIBC: 183 ug/dL (ref 120–384)

## 2020-04-17 LAB — FERRITIN: Ferritin: 2445 ng/mL — ABNORMAL HIGH (ref 11–307)

## 2020-04-17 LAB — LACTATE DEHYDROGENASE: LDH: 196 U/L — ABNORMAL HIGH (ref 98–192)

## 2020-04-17 MED ORDER — DESMOPRESSIN ACETATE SPRAY 0.01 % NA SOLN: 10.0000 ug | NASAL | 3 refills | Status: DC

## 2020-04-17 NOTE — Assessment & Plan Note (Signed)
04/06/2020: Hemoglobin 8.3, MCV 91.2, platelets 479, WBC 4.2, alkaline phosphatase 370, AST 143, ALT 131, total protein 9.3, creatinine 0.99  Differential diagnosis of the severe normocytic anemia: Recent pneumonia versus Imuran versus underlying bone marrow disorders like MDS.  Plan: Obtain labs which include CBC, CMP, LDH, reticulocyte count, SPEP, iron studies. If all of these tests are normal then we would have to consider doing a bone marrow biopsy.

## 2020-04-17 NOTE — Telephone Encounter (Signed)
Received a fax regarding Prior Authorization from [facility] for Brandy Medina. Authorization has been DENIED because plan required requires diagnosis of IPF that has been confirmed by HRCT or Lung Biopsy showing probable UIP for approval.

## 2020-04-17 NOTE — Assessment & Plan Note (Signed)
Patient has an apparent history of von Willebrand disease. At [facility], We do not have any confirmation of this.  Pathophysiology: I discussed von Willebrand factor in great detail and explained why deficiency von Willebrand factor leads to deficiency of factor VIII.   Plan: 1. Check von Willebrand factor antigen, multimers, ristocetin cofactor, factor VIII levels and coagulation tests today . 2. for major surgical procedures: She will need IV desmopressin 0.3 mcg/kg (maximum dose of 20 g) intravenously. This will need to be repeated postoperatively irrespective of the von Willebrand factor levels. 3. Patient will also need desmopressin nasal inhalation 1 puff in each nostril for 3 weeks postoperatively twice a day. If she has postoperative bleed, she will need IV desmopressin until the bleeding stops on a daily basis.  Telephone visit in 1 week to discuss results

## 2020-04-17 NOTE — Telephone Encounter (Signed)
Scheduled appts per 3/22 los. Pt aware.

## 2020-04-18 LAB — ERYTHROPOIETIN: Erythropoietin: 38.9 m[IU]/mL — ABNORMAL HIGH (ref 2.6–18.5)

## 2020-04-18 LAB — VON WILLEBRAND PANEL
Coagulation Factor VIII: 331 % — ABNORMAL HIGH (ref 56–140)
Ristocetin Co-factor, Plasma: 90 % (ref 50–200)
Von Willebrand Antigen, Plasma: 280 % — ABNORMAL HIGH (ref 50–200)

## 2020-04-18 LAB — COAG STUDIES INTERP REPORT

## 2020-04-19 ENCOUNTER — Other Ambulatory Visit: Payer: Self-pay | Admitting: Gastroenterology

## 2020-04-19 DIAGNOSIS — I10 Essential (primary) hypertension: Secondary | ICD-10-CM | POA: Diagnosis not present

## 2020-04-19 DIAGNOSIS — D649 Anemia, unspecified: Secondary | ICD-10-CM | POA: Diagnosis not present

## 2020-04-19 DIAGNOSIS — J849 Interstitial pulmonary disease, unspecified: Secondary | ICD-10-CM | POA: Diagnosis not present

## 2020-04-19 DIAGNOSIS — E1122 Type 2 diabetes mellitus with diabetic chronic kidney disease: Secondary | ICD-10-CM | POA: Diagnosis not present

## 2020-04-19 DIAGNOSIS — D680 Von Willebrand's disease: Secondary | ICD-10-CM | POA: Diagnosis not present

## 2020-04-19 DIAGNOSIS — M3500 Sicca syndrome, unspecified: Secondary | ICD-10-CM | POA: Diagnosis not present

## 2020-04-19 LAB — MULTIPLE MYELOMA PANEL, SERUM
Albumin SerPl Elph-Mcnc: 3.4 g/dL (ref 2.9–4.4)
Albumin/Glob SerPl: 0.6 — ABNORMAL LOW (ref 0.7–1.7)
Alpha 1: 0.3 g/dL (ref 0.0–0.4)
Alpha2 Glob SerPl Elph-Mcnc: 1.2 g/dL — ABNORMAL HIGH (ref 0.4–1.0)
B-Globulin SerPl Elph-Mcnc: 1 g/dL (ref 0.7–1.3)
Gamma Glob SerPl Elph-Mcnc: 3.2 g/dL — ABNORMAL HIGH (ref 0.4–1.8)
Globulin, Total: 5.9 g/dL — ABNORMAL HIGH (ref 2.2–3.9)
IgA: 174 mg/dL (ref 87–352)
IgG (Immunoglobin G), Serum: 3579 mg/dL — ABNORMAL HIGH (ref 586–1602)
IgM (Immunoglobulin M), Srm: 394 mg/dL — ABNORMAL HIGH (ref 26–217)
Total Protein ELP: 9.3 g/dL — ABNORMAL HIGH (ref 6.0–8.5)

## 2020-04-19 MED ORDER — SODIUM CHLORIDE 0.9 % IV SOLN: 0.3000 ug/kg | Freq: Once | INTRAVENOUS | Status: DC

## 2020-04-20 ENCOUNTER — Telehealth: Payer: Self-pay | Admitting: Pharmacist

## 2020-04-20 DIAGNOSIS — J849 Interstitial pulmonary disease, unspecified: Secondary | ICD-10-CM

## 2020-04-20 DIAGNOSIS — Z5181 Encounter for therapeutic drug level monitoring: Secondary | ICD-10-CM

## 2020-04-20 NOTE — Telephone Encounter (Signed)
Received notification from [facility] that patient's appeal led to overturning of initial denial. Esbriet approved from 01/28/20 through 01/26/21.  Patient's copay for first 30 days is $100. Fatima Sanger will cover copay.  Patient enrolled int PAF grant. Enrolled from 01/28/20 through 01/26/21 [facility] billing information: PCN: [id number] [id number] [id number] ID: [id number] Processing: [place] Phone: [phone]

## 2020-04-20 NOTE — Telephone Encounter (Signed)
HPI  Brandy Medina is a pleasant individual who was called today by pharmacy team for Esbriet new start. 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 Lindi Adie), Sjogren's disease (followed by Dr. Amil Amen, starting azathioprine early March 2022). Patient is former smoker (quit 1995) - 0.5ppd x 18 years.  Patient is naive to anti-fibrotics, however decision was made by patient and Dr. Vaughan Browner jointly that Ofev should be avoided d/t history of vW and bleeding diastasis.  Differential diagnosis per Dr. Lindi Adie for severe anemia as well as recent elevated LFTs: recent pneumonia vs azathioprine new start vs underlying bone marrow disorder. F/u with Dr. Lindi Adie is 05/10/20  OBJECTIVE Allergies  Allergen Reactions  . Aspirin   . Avelox [Moxifloxacin Hcl In Nacl] Other (See Comments)    confusion  . Codeine     REACTION: nausea  . Erythromycin     GI upset  . Lisinopril     REACTION: Headache  . Sulfonamide Derivatives     REACTION: headache    Outpatient Encounter Medications as of 04/20/2020  Medication Sig  . acetaminophen (TYLENOL) 325 MG tablet Take 650 mg by mouth every 6 (six) hours as needed for headache.  Marland Kitchen amLODipine (NORVASC) 5 MG tablet Take 5 mg by mouth daily.  Marland Kitchen azaTHIOprine (IMURAN) 50 MG tablet Take 50 mg by mouth in the morning and at bedtime.  . butalbital-acetaminophen-caffeine (FIORICET WITH CODEINE) 50-325-40-30 MG capsule Take 1 capsule by mouth every 8 (eight) hours as needed for headache.  . chlorpheniramine (CHLOR-TRIMETON) 4 MG tablet 1 at bedtime and 1 every 4 hours as needed during the day  . desmopressin (DDAVP NASAL) 0.01 % solution Place 1 spray (10 mcg total) into the nose as directed. Instill 0.05 drop in nostril once a day at time of procedures/surgery  . dextromethorphan (DELSYM) 30 MG/5ML liquid 2 every 12 hours as needed  . Ergocalciferol (VITAMIN D2) 2000 units TABS Take 1 tablet  by mouth 2 (two) times a week.  . ferrous sulfate 325 (65 FE) MG tablet Take 325 mg by mouth in the morning and at bedtime.  . folic acid (FOLVITE) 539 MCG tablet Take 400 mcg by mouth daily.  Marland Kitchen losartan (COZAAR) 100 MG tablet Take 1 tablet by mouth daily.  . metFORMIN (GLUCOPHAGE) 500 MG tablet Take 500 mg by mouth daily with breakfast.   . pilocarpine (SALAGEN) 5 MG tablet Take 5 mg by mouth in the morning, at noon, and at bedtime.  Marland Kitchen Respiratory Therapy Supplies (FLUTTER) DEVI Use as directed.   Facility-Administered Encounter Medications as of 04/20/2020  Medication  . [START ON 06/08/2020] desmopressin (DDAVP) 20.4 mcg in sodium chloride 0.9 % 50 mL IVPB  . [START ON 06/08/2020] desmopressin (DDAVP) 20.4 mcg in sodium chloride 0.9 % 50 mL IVPB     Immunization History  Administered Date(s) Administered  . H1N1 03/27/2008  . Influenza Split 01/09/2012, 11/29/2012, 09/28/2014, 10/24/2019  . Influenza, High Dose Seasonal PF 10/29/2018  . Influenza,inj,Quad PF,6+ Mos 10/02/2015  . PFIZER(Purple Top)SARS-COV-2 Vaccination 03/07/2019, 04/01/2019, 10/25/2019  . Pneumococcal Conjugate-13 09/19/2014  . Pneumococcal Polysaccharide-23 10/02/2015  . Zoster Recombinat (Shingrix) 03/18/2018, 07/15/2018     HRCT: 02/20/2020-moderate to severe pulmonary fibrosis in UIP pattern.  Worsened since previous scan in 2010, groundglass opacities in the peripheral right upper lobe  PFT's TLC  Date Value Ref Range Status  06/29/2019 4.98 L Final     CMP  Component Value Date/Time   NA 131 (L) 04/17/2020 1145   NA 133 (L) 11/20/2015 1608   K 4.3 04/17/2020 1145   K 4.2 11/20/2015 1608   CL 98 04/17/2020 1145   CO2 23 04/17/2020 1145   CO2 24 11/20/2015 1608   GLUCOSE 123 (H) 04/17/2020 1145   GLUCOSE 90 11/20/2015 1608   BUN 9 04/17/2020 1145   BUN 25.3 11/20/2015 1608   CREATININE 1.00 04/17/2020 1145   CREATININE 1.0 11/20/2015 1608   CALCIUM 9.4 04/17/2020 1145   CALCIUM 10.5 (H)  11/20/2015 1608   PROT 9.7 (H) 04/17/2020 1145   PROT 10.3 (H) 11/20/2015 1608   PROT 10.3 (H) 11/20/2015 1608   ALBUMIN 3.3 (L) 04/17/2020 1145   ALBUMIN 3.6 11/20/2015 1608   AST 55 (H) 04/17/2020 1145   AST 19 11/20/2015 1608   ALT 66 (H) 04/17/2020 1145   ALT 13 11/20/2015 1608   ALKPHOS 496 (H) 04/17/2020 1145   ALKPHOS 98 11/20/2015 1608   BILITOT 0.4 04/17/2020 1145   BILITOT 0.33 11/20/2015 1608   GFRNONAA >60 04/17/2020 1145     CBC    Component Value Date/Time   WBC 4.9 04/17/2020 1145   WBC 4.2 04/06/2020 0934   RBC 2.58 (L) 04/17/2020 1145   HGB 7.9 (L) 04/17/2020 1145   HGB 9.5 (L) 11/20/2015 1608   HCT 23.8 (L) 04/17/2020 1145   HCT 27.7 (L) 11/20/2015 1608   PLT 388 04/17/2020 1145   PLT 249 11/20/2015 1608   MCV 92.2 04/17/2020 1145   MCV 92.3 11/20/2015 1608   MCH 30.6 04/17/2020 1145   MCHC 33.2 04/17/2020 1145   RDW 16.7 (H) 04/17/2020 1145   RDW 14.3 11/20/2015 1608   LYMPHSABS 0.5 (L) 04/17/2020 1145   LYMPHSABS 1.0 11/20/2015 1608   MONOABS 0.5 04/17/2020 1145   MONOABS 0.4 11/20/2015 1608   EOSABS 0.0 04/17/2020 1145   EOSABS 0.0 11/20/2015 1608   BASOSABS 0.0 04/17/2020 1145   BASOSABS 0.0 11/20/2015 1608     LFT's Hepatic Function Latest Ref Rng & Units 04/17/2020 04/06/2020 11/20/2015  Total Protein 6.5 - 8.1 g/dL 9.7(H) 9.3(H) 10.3(H)  Albumin 3.5 - 5.0 g/dL 3.3(L) 3.5 3.6  AST 15 - 41 U/L 55(H) 143(H) 19  ALT 0 - 44 U/L 66(H) 131(H) 13  Alk Phosphatase 38 - 126 U/L 496(H) 370(H) 98  Total Bilirubin 0.3 - 1.2 mg/dL 0.4 0.4 0.33     ASSESSMENT  1. Esbriet Medication Management  Patient counseled on purpose, proper use, and potential adverse effects including nausea, vomiting, abdominal pain, GERD, weight loss, arthralgia, dizziness, and suns sensitivity/rash.  Stressed the importance of routine lab monitoring. Will monitor LFT's every month for the first 6 months of treatment then every 3 months. Will monitor CBC every 3  months.  Starting dose will be Esbriet 267 mg 1 tablet three times daily for 7 days, then 2 tablets three times daily for 7 days, then 3 tablets three times daily.  Maintenance dose will be 801 mg 1 tablet three times daily if tolerated.  Stressed the importance of taking with meals to minimize stomach upset.  Patient's AST/ALT on 04/17/20 were elevated <3xULN.  Improved from 04/06/20.  Total bili WNL.  After discussion with Dr. Chase Caller, plan is for patient to repeat CMP in 2 weeks after starting Esbriet to closely monitor LFTs. Patient advised to repeat CMP 2 weeks after starting and then can re-check again at 1 month at [place] with Dr. Vaughan Browner.  2. Medication Reconciliation  A drug regimen assessment was performed, including review of allergies, interactions, disease-state management, dosing and immunization history. Medications were reviewed with the patient, including name, instructions, indication, goals of therapy, potential side effects, importance of adherence, and safe use.  Drug interaction(s):   PLAN - Start Esbriet.  Take 1 tab by mouth three times daily for 7 days, then take 2 tabs by mouth three times daily for 7 days. Recheck LFTs at 2 weeks. Then if LFTs improved, can increase to 3 tabs by mouth three times daily thereafter. Rx sent to [facility] with PAF grant information (patient enrolled in grant through, through 01/26/21) - Medication will be shipped to patient tomorrow, 04/27/20, but she plans to start on Monday, 04/30/20 - LFTs at 2 weeks x 2, then monthly x 5, then every 3 months thereafter. - CBC every 3 months (followed by heme/onc Dr. Lindi Adie) - F/u with Dr. Vaughan Browner scheduled 05/17/20.  All questions encouraged and answered.  Instructed patient to call with any further questions or concerns.  Thank you for allowing pharmacy to participate in this patient's care.  Knox Saliva, PharmD, MPH Clinical Pharmacist (Rheumatology and Pulmonology)

## 2020-04-26 ENCOUNTER — Other Ambulatory Visit: Payer: Self-pay | Admitting: Pulmonary Disease

## 2020-04-26 MED ORDER — ESBRIET 267 MG PO TABS: ORAL_TABLET | ORAL | 0 refills | Status: DC

## 2020-04-26 MED FILL — ESBRIET 267 MG TABS: 267 | 30 days supply | Qty: 207 | Fill #0

## 2020-04-26 NOTE — Telephone Encounter (Signed)
Pt calling because she has not received her medication and she states that she will be going out of town this weekend. Provided pt with phone number to [employer] to check that status of delivery. Pt can be reached at [phone] or [phone].

## 2020-05-02 ENCOUNTER — Other Ambulatory Visit (HOSPITAL_COMMUNITY): Payer: Self-pay

## 2020-05-02 MED FILL — Desmopressin Acetate Nasal Spray Soln 0.01%: NASAL | 30 days supply | Qty: 5 | Fill #0 | Status: AC

## 2020-05-04 ENCOUNTER — Other Ambulatory Visit (HOSPITAL_COMMUNITY): Payer: Self-pay

## 2020-05-07 NOTE — Progress Notes (Signed)
Patient Care Team: Wenda Low, MD as PCP - General (Internal Medicine)  DIAGNOSIS:    ICD-10-CM   1. Von Willebrand disease ([place])  D68.0   2. Normocytic anemia  D64.9     CHIEF COMPLIANT: Follow-up of anemia, Von Willebrand's disease  INTERVAL HISTORY: Brandy Medina is a 70 y.o. with above-mentioned history of iron deficiency anemia and Von Willebrand's disease. She presents to the clinic today for follow-up.  She reports continued fatigue issues.  Although her symptoms are slightly better.  ALLERGIES:  is allergic to aspirin, avelox [moxifloxacin hcl in nacl], codeine, erythromycin, lisinopril, and sulfonamide derivatives.  MEDICATIONS:  Current Outpatient Medications  Medication Sig Dispense Refill  . acetaminophen (TYLENOL) 325 MG tablet Take 650 mg by mouth every 6 (six) hours as needed for headache.    Marland Kitchen amLODipine (NORVASC) 5 MG tablet Take 5 mg by mouth daily.    Marland Kitchen azaTHIOprine (IMURAN) 50 MG tablet Take 50 mg by mouth in the morning and at bedtime.    . butalbital-acetaminophen-caffeine (FIORICET WITH CODEINE) 50-325-40-30 MG capsule Take 1 capsule by mouth every 8 (eight) hours as needed for headache.    . chlorpheniramine (CHLOR-TRIMETON) 4 MG tablet 1 at bedtime and 1 every 4 hours as needed during the day    . desmopressin (DDAVP NASAL) 0.01 % solution PLACE 1 SPRAY INTO THE NOSE AS DIRECTED ONCE A DAY AT TIME OF PROCEDURES/SURGERY 5 mL 3  . dextromethorphan (DELSYM) 30 MG/5ML liquid 2 every 12 hours as needed    . Ergocalciferol (VITAMIN D2) 2000 units TABS Take 1 tablet by mouth 2 (two) times a week.    . ferrous sulfate 325 (65 FE) MG tablet Take 325 mg by mouth in the morning and at bedtime.    . folic acid (FOLVITE) 242 MCG tablet Take 400 mcg by mouth daily.    Marland Kitchen losartan (COZAAR) 100 MG tablet Take 1 tablet by mouth daily.    . metFORMIN (GLUCOPHAGE) 500 MG tablet Take 500 mg by mouth daily with breakfast.   0  . pilocarpine (SALAGEN) 5 MG tablet Take 5 mg by  mouth in the morning, at noon, and at bedtime.    . Pirfenidone 267 MG TABS TAKE 1 TAB BY MOUTH THREE TIMES DAILY FOR 7 DAYS, THEN TAKE 2 TABS THREE TIMES DAILY FOR 7 DAYS, THEN TAKE 3 TABS THREE TIMES DAILY THEREAFTER 207 tablet 0  . Respiratory Therapy Supplies (FLUTTER) DEVI Use as directed. 1 each 0   Current Facility-Administered Medications  Medication Dose Route Frequency Provider Last Rate Last Admin  . [START ON 06/08/2020] desmopressin (DDAVP) 20.4 mcg in sodium chloride 0.9 % 50 mL IVPB  0.3 mcg/kg Intravenous Once Salley Slaughter, PA-C      . [START ON 06/08/2020] desmopressin (DDAVP) 20.4 mcg in sodium chloride 0.9 % 50 mL IVPB  0.3 mcg/kg Intravenous Once Salley Slaughter, PA-C        PHYSICAL EXAMINATION: ECOG PERFORMANCE STATUS: 1 - Symptomatic but completely ambulatory  Vitals:   05/08/20 1345  BP: (!) 149/79  Pulse: (!) 105  Resp: 16  Temp: (!) 97.5 F (36.4 C)  SpO2: 99%   Filed Weights   05/08/20 1345  Weight: 155 lb 3.2 oz (70.4 kg)    LABORATORY DATA:  I have reviewed the data as listed CMP Latest Ref Rng & Units 04/17/2020 04/06/2020 11/20/2015  Glucose 70 - 99 mg/dL 123(H) 107(H) 90  BUN 8 - 23 mg/dL 9 13 25.3  Creatinine 0.44 -  1.00 mg/dL 1.00 0.99 1.0  Sodium 135 - 145 mmol/L 131(L) 131(L) 133(L)  Potassium 3.5 - 5.1 mmol/L 4.3 4.4 4.2  Chloride 98 - 111 mmol/L 98 97 -  CO2 22 - 32 mmol/L '23 27 24  ' Calcium 8.9 - 10.3 mg/dL 9.4 9.7 10.5(H)  Total Protein 6.5 - 8.1 g/dL 9.7(H) 9.3(H) 10.3(H)  Total Bilirubin 0.3 - 1.2 mg/dL 0.4 0.4 0.33  Alkaline Phos 38 - 126 U/L 496(H) 370(H) 98  AST 15 - 41 U/L 55(H) 143(H) 19  ALT 0 - 44 U/L 66(H) 131(H) 13    Lab Results  Component Value Date   WBC 4.1 05/08/2020   HGB 8.3 (L) 05/08/2020   HCT 26.0 (L) 05/08/2020   MCV 97.4 05/08/2020   PLT 266 05/08/2020   NEUTROABS 2.2 05/08/2020    ASSESSMENT & PLAN:  Von Willebrand disease ([place]) Patient has an apparent history of von Willebrand disease.   (Dr. Brent Bulla)    Lab review 04/17/2020:: 1.  von Willebrand factor antigen: 280% 2. factor VIII: 331% 3.  Ristocetin cofactor: 90%   These lab results do not show any evidence of von Willebrand disease. Therefore she does not need desmopressin prior to procedures.  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 We have also held azathioprine for another month. She has a rheumatology appointment on May 12. I will connect with her on May 16 to discuss lab results from her appointment  watchful monitoring.  If it gets any worse then will consider doing a bone marrow biopsy and or consider giving Aranesp.      No orders of the defined types were placed in this encounter.  The patient has a good understanding of the overall plan. she agrees with it. she will call with any problems that may develop before the next visit here.  Total time spent: 20 mins including face to face time and time spent for planning, charting and coordination of care  Rulon Eisenmenger, MD, MPH 05/08/2020  I, Molly Dorshimer, am acting as scribe for Dr. Nicholas Lose.  I have reviewed the above documentation for accuracy and completeness, and I agree with the above.

## 2020-05-08 ENCOUNTER — Inpatient Hospital Stay: Payer: Medicare PPO | Admitting: Hematology and Oncology

## 2020-05-08 ENCOUNTER — Inpatient Hospital Stay: Payer: Medicare PPO | Attending: Hematology and Oncology

## 2020-05-08 ENCOUNTER — Other Ambulatory Visit: Payer: Self-pay

## 2020-05-08 DIAGNOSIS — Z7984 Long term (current) use of oral hypoglycemic drugs: Secondary | ICD-10-CM | POA: Diagnosis not present

## 2020-05-08 DIAGNOSIS — R5383 Other fatigue: Secondary | ICD-10-CM | POA: Diagnosis not present

## 2020-05-08 DIAGNOSIS — D6800 Von Willebrand disease, unspecified: Secondary | ICD-10-CM

## 2020-05-08 DIAGNOSIS — D649 Anemia, unspecified: Secondary | ICD-10-CM

## 2020-05-08 DIAGNOSIS — D509 Iron deficiency anemia, unspecified: Secondary | ICD-10-CM | POA: Diagnosis not present

## 2020-05-08 DIAGNOSIS — Z79899 Other long term (current) drug therapy: Secondary | ICD-10-CM | POA: Diagnosis not present

## 2020-05-08 LAB — CBC WITH DIFFERENTIAL (CANCER CENTER ONLY)
Abs Immature Granulocytes: 0.03 10*3/uL (ref 0.00–0.07)
Basophils Absolute: 0 10*3/uL (ref 0.0–0.1)
Basophils Relative: 0 %
Eosinophils Absolute: 0.1 10*3/uL (ref 0.0–0.5)
Eosinophils Relative: 2 %
HCT: 26 % — ABNORMAL LOW (ref 36.0–46.0)
Hemoglobin: 8.3 g/dL — ABNORMAL LOW (ref 12.0–15.0)
Immature Granulocytes: 1 %
Lymphocytes Relative: 28 %
Lymphs Abs: 1.1 10*3/uL (ref 0.7–4.0)
MCH: 31.1 pg (ref 26.0–34.0)
MCHC: 31.9 g/dL (ref 30.0–36.0)
MCV: 97.4 fL (ref 80.0–100.0)
Monocytes Absolute: 0.6 10*3/uL (ref 0.1–1.0)
Monocytes Relative: 16 %
Neutro Abs: 2.2 10*3/uL (ref 1.7–7.7)
Neutrophils Relative %: 53 %
Platelet Count: 266 10*3/uL (ref 150–400)
RBC: 2.67 MIL/uL — ABNORMAL LOW (ref 3.87–5.11)
RDW: 19.3 % — ABNORMAL HIGH (ref 11.5–15.5)
WBC Count: 4.1 10*3/uL (ref 4.0–10.5)
nRBC: 0 % (ref 0.0–0.2)

## 2020-05-08 NOTE — Assessment & Plan Note (Signed)
Lab review 04/17/2020: Hemoglobin 7.9, MCV 92.2, WBC 4.9, platelets 88, erythropoietin 38.9, ferritin 2445, iron saturation 33%, LDH 196, SPEP: Polyclonal rise in immunoglobulins, no M protein, AST 55, ALT 66, alkaline phosphatase 496, creatinine 1  Anemia due to chronic inflammation (as evidenced by elevated polyclonal gamma globulins and elevated ferritin levels): Recent pneumonia probably is the culprit.  Consider watchful monitoring.  If it gets any worse then will consider doing a bone marrow biopsy.

## 2020-05-08 NOTE — Assessment & Plan Note (Signed)
Patient has an apparent history of von Willebrand disease.  (Dr. Brent Bulla)    Lab review 04/17/2020:: 1.  von Willebrand factor antigen: 280% 2. factor VIII: 331% 3.  Ristocetin cofactor: 90%   These lab results do not show any evidence of von Willebrand disease. Therefore she does not need desmopressin prior to procedures.

## 2020-05-10 ENCOUNTER — Ambulatory Visit (HOSPITAL_COMMUNITY): Payer: Medicare PPO | Attending: Cardiology

## 2020-05-10 ENCOUNTER — Ambulatory Visit (INDEPENDENT_AMBULATORY_CARE_PROVIDER_SITE_OTHER)
Admission: RE | Admit: 2020-05-10 | Discharge: 2020-05-10 | Disposition: A | Discharge status: Home / Self Care | Payer: Self-pay | Source: Ambulatory Visit | Attending: Interventional Cardiology | Admitting: Interventional Cardiology

## 2020-05-10 ENCOUNTER — Other Ambulatory Visit: Payer: Self-pay

## 2020-05-10 DIAGNOSIS — E119 Type 2 diabetes mellitus without complications: Secondary | ICD-10-CM | POA: Diagnosis not present

## 2020-05-10 DIAGNOSIS — D680 Von Willebrand's disease: Secondary | ICD-10-CM | POA: Diagnosis not present

## 2020-05-10 DIAGNOSIS — Z87891 Personal history of nicotine dependence: Secondary | ICD-10-CM | POA: Diagnosis not present

## 2020-05-10 DIAGNOSIS — I119 Hypertensive heart disease without heart failure: Secondary | ICD-10-CM | POA: Diagnosis not present

## 2020-05-10 DIAGNOSIS — Z8249 Family history of ischemic heart disease and other diseases of the circulatory system: Secondary | ICD-10-CM | POA: Diagnosis not present

## 2020-05-10 DIAGNOSIS — J8410 Pulmonary fibrosis, unspecified: Secondary | ICD-10-CM | POA: Insufficient documentation

## 2020-05-10 DIAGNOSIS — R079 Chest pain, unspecified: Secondary | ICD-10-CM

## 2020-05-10 DIAGNOSIS — R0602 Shortness of breath: Secondary | ICD-10-CM

## 2020-05-10 DIAGNOSIS — I272 Pulmonary hypertension, unspecified: Secondary | ICD-10-CM | POA: Diagnosis not present

## 2020-05-10 LAB — ECHOCARDIOGRAM COMPLETE
Area-P 1/2: 3.92 cm2
S' Lateral: 2.3 cm

## 2020-05-11 NOTE — Telephone Encounter (Addendum)
Called Ms. Dieujuste to discuss side effects. States she doesn't have any nausea or vomiting but general upset stomach. She is taking ginger to help but hasn't helped. She tolerated 1 tab three times daily with no side effects. Was tolerating 2 tab three times daily for about 3 days. Side effects started on Day 3-4 with general stomach upset and tiredness. Discussed that anemia can also cause tiredness and chills. Does not have any other symptoms of infections. No abdominal pain or discoloration in urine.  Azathioprine currently on hold x 1 month until f/u with rheumatology, Dr. Amil Amen. States that at her last heme/onc visit, it was determined that labs are not consistent with von Willebrand's but she has formal diagnosis  Discussed that we could add ondansetron PRN if she'd like to have on hand, but at this time she'd like to minimize pill burden. She will continue to trial 2 tabs three times daily through the weekend. If symptoms resolve, she will increase dose to 3 tabs three times daily. If symptoms persist through the weekend, she will continue 2 tabs three times daily until upset stomach becomes manageable.  Does not eat full meals at baseline but has been taking Esbriet with food. We discussed that we could adjust titration if needed to allow her to adjust to side effects. Patient verbalized understanding.   She still plans to complete labs on 05/14/20.  Will f/u

## 2020-05-12 ENCOUNTER — Ambulatory Visit
Admission: EM | Admit: 2020-05-12 | Discharge: 2020-05-12 | Disposition: A | Discharge status: Home / Self Care | Payer: Medicare PPO | Attending: Emergency Medicine | Admitting: Emergency Medicine

## 2020-05-12 ENCOUNTER — Other Ambulatory Visit: Payer: Self-pay

## 2020-05-12 DIAGNOSIS — J0100 Acute maxillary sinusitis, unspecified: Secondary | ICD-10-CM | POA: Diagnosis not present

## 2020-05-12 LAB — POCT RAPID STREP A (OFFICE): Rapid Strep A Screen: NEGATIVE

## 2020-05-12 MED ORDER — AMOXICILLIN-POT CLAVULANATE 875-125 MG PO TABS: 1.0000 | ORAL_TABLET | Freq: Two times a day (BID) | ORAL | 0 refills | Status: DC

## 2020-05-12 MED ORDER — FLUTICASONE PROPIONATE 50 MCG/ACT NA SUSP: 1.0000 | Freq: Every day | NASAL | 0 refills | Status: DC

## 2020-05-12 MED ORDER — BENZONATATE 200 MG PO CAPS: 200.0000 mg | ORAL_CAPSULE | Freq: Three times a day (TID) | ORAL | 0 refills | Status: AC | PRN

## 2020-05-12 NOTE — ED Provider Notes (Signed)
[facility]    CSN: [id number] Arrival date & time: 05/12/20  0815      History   Chief Complaint Chief Complaint  Patient presents with  . Sore Throat  . Neck Pain    HPI Brandy Medina is a 70 y.o. female history of hypertension, migraines, diabetes, von Willebrand's, Sjogren's, pulmonary fibrosis, presenting today for evaluation of sore throat and sinus pressure.  Reports over the past 3 to 4 days has had chills, fevers up to 103, sinus pressure/headaches, nasal congestion and drainage throat discomfort and irritation especially on left side.  Has had a cough, but denies any significant worsening cough from her baseline.  Has had some slight mucus production.  Reports at home Covid test negative.  HPI  Past Medical History:  Diagnosis Date  . Allergic rhinitis   . Diabetes ([place])   . HTN (hypertension)   . Migraines     Patient Active Problem List   Diagnosis Date Noted  . Von Willebrand disease ([place]) 04/17/2020  . Normocytic anemia 04/17/2020  . ILD (interstitial lung disease) ([place]) 02/27/2020  . Sjogren's syndrome ([place]) 02/27/2020  . Abnormal findings on diagnostic imaging of lung 02/27/2020  . Abnormal ECG 12/12/2015  . 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 Essential hypertension 07/07/2014  . Upper airway cough syndrome 07/06/2014  . Obstructive bronchiectasis ([place]) 06/20/2009  . Nonspecific (abnormal) findings on radiological and other examination of body structure 08/02/2008  . COMPUTERIZED TOMOGRAPHY, CHEST, ABNORMAL 08/02/2008  . HYPOGLYCEMIA, REACTIVE 08/01/2008  . MIGRAINE HEADACHE 08/01/2008  . Sinusitis, chronic 08/01/2008  . GERD 08/01/2008  . ABDOMINAL PAIN, CHRONIC 08/01/2008  . ANEMIA, IRON DEFICIENCY, HX OF 08/01/2008  . DIVERTICULOSIS, COLON, HX OF 08/01/2008    Past Surgical History:  Procedure Laterality Date  . ABDOMINAL HYSTERECTOMY    . BREAST SURGERY      OB History   No  obstetric history on file.      Home Medications    Prior to Admission medications   Medication Sig Start Date End Date Taking? Authorizing Provider  amoxicillin-clavulanate (AUGMENTIN) 875-125 MG tablet Take 1 tablet by mouth every 12 (twelve) hours for 7 days. With food 05/12/20 05/19/20 Yes Georgiana Spillane C, PA-C  benzonatate (TESSALON) 200 MG capsule Take 1 capsule (200 mg total) by mouth 3 (three) times daily as needed for up to 7 days for cough. 05/12/20 05/19/20 Yes Alexiz Cothran C, PA-C  fluticasone (FLONASE) 50 MCG/ACT nasal spray Place 1-2 sprays into both nostrils daily. 05/12/20  Yes Tayon Parekh C, PA-C  acetaminophen (TYLENOL) 325 MG tablet Take 650 mg by mouth every 6 (six) hours as needed for headache.    [provider]  amLODipine (NORVASC) 5 MG tablet Take 5 mg by mouth daily. 01/17/20   [provider]  azaTHIOprine (IMURAN) 50 MG tablet Take 50 mg by mouth in the morning and at bedtime.    [provider]  butalbital-acetaminophen-caffeine (FIORICET WITH CODEINE) 50-325-40-30 MG capsule Take 1 capsule by mouth every 8 (eight) hours as needed for headache.    [provider]  chlorpheniramine (CHLOR-TRIMETON) 4 MG tablet 1 at bedtime and 1 every 4 hours as needed during the day    [provider]  desmopressin (DDAVP NASAL) 0.01 % solution PLACE 1 SPRAY INTO THE NOSE AS DIRECTED ONCE A DAY AT TIME OF PROCEDURES/SURGERY 04/17/20 04/17/21  Nicholas Lose, MD  dextromethorphan (DELSYM) 30 MG/5ML liquid 2 every 12 hours as needed  [provider]  Ergocalciferol (VITAMIN D2) 2000 units TABS Take 1 tablet by mouth 2 (two) times a week.    [provider]  ferrous sulfate 325 (65 FE) MG tablet Take 325 mg by mouth in the morning and at bedtime.    [provider]  folic acid (FOLVITE) 161 MCG tablet Take 400 mcg by mouth daily.    [provider]  losartan (COZAAR) 100 MG tablet Take 1 tablet by mouth  daily.    [provider]  metFORMIN (GLUCOPHAGE) 500 MG tablet Take 500 mg by mouth daily with breakfast.  06/18/14   [provider]  pilocarpine (SALAGEN) 5 MG tablet Take 5 mg by mouth in the morning, at noon, and at bedtime. 09/07/19   [provider]  Pirfenidone 267 MG TABS TAKE 1 TAB BY MOUTH THREE TIMES DAILY FOR 7 DAYS, THEN TAKE 2 TABS THREE TIMES DAILY FOR 7 DAYS, THEN TAKE 3 TABS THREE TIMES DAILY THEREAFTER 04/26/20 04/26/21  Marshell Garfinkel, MD  Respiratory Therapy Supplies (FLUTTER) DEVI Use as directed. 06/29/19   Lauraine Rinne, NP    Family History Family History  Problem Relation Age of Onset  . Renal Disease Brother   . Stroke Brother   . Bronchitis Mother   . Heart disease Mother   . Heart disease Father   . Heart attack Father   . Hypertension Sister   . Diabetes Sister   . Heart attack Brother   . Hypertension Sister   . Diabetes Sister     Social History Social History   Tobacco Use  . Smoking status: Former Smoker    Packs/day: 0.50    Years: 18.00    Pack years: 9.00    Types: Cigarettes    Quit date: 07/05/1993    Years since quitting: 26.8  . Smokeless tobacco: Never Used     Allergies   Aspirin, Avelox [moxifloxacin hcl in nacl], Codeine, Erythromycin, Lisinopril, and Sulfonamide derivatives   Review of Systems Review of Systems  Constitutional: Positive for chills and fever. Negative for activity change, appetite change and fatigue.  HENT: Positive for congestion, sinus pressure and sore throat. Negative for ear pain, rhinorrhea and trouble swallowing.   Eyes: Negative for discharge and redness.  Respiratory: Negative for cough, chest tightness and shortness of breath.   Cardiovascular: Negative for chest pain.  Gastrointestinal: Negative for abdominal pain, diarrhea, nausea and vomiting.  Musculoskeletal: Positive for myalgias and neck pain.  Skin: Negative for rash.  Neurological: Positive for headaches. Negative  for dizziness and light-headedness.     Physical Exam Triage Vital Signs ED Triage Vitals  Enc Vitals Group     BP      Pulse      Resp      Temp      Temp src      SpO2      Weight      Height      Head Circumference      Peak Flow      Pain Score      Pain Loc      Pain Edu?      Excl. in [place]?    No data found.  Updated Vital Signs BP 113/76 (BP Location: Left Arm)   Pulse 97   Temp 98.7 F (37.1 C) (Oral)   Resp 16   SpO2 94%   Visual Acuity Right Eye Distance:   Left Eye Distance:   Bilateral Distance:  Right Eye Near:   Left Eye Near:    Bilateral Near:     Physical Exam Vitals and nursing note reviewed.  Constitutional:      Appearance: She is well-developed.     Comments: No acute distress  HENT:     Head: Normocephalic and atraumatic.     Ears:     Comments: Bilateral ears without tenderness to palpation of external auricle, tragus and mastoid, EAC's without erythema or swelling, TM's with good bony landmarks and cone of light. Non erythematous.     Nose: Nose normal.     Mouth/Throat:     Comments: Oral mucosa pink and moist, no tonsillar enlargement or exudate. Posterior pharynx patent and nonerythematous, no uvula deviation or swelling. Normal phonation. Eyes:     Conjunctiva/sclera: Conjunctivae normal.  Cardiovascular:     Rate and Rhythm: Normal rate and regular rhythm.  Pulmonary:     Effort: Pulmonary effort is normal. No respiratory distress.     Comments: Breathing comfortably at rest, CTABL, crackles noted at bilateral bases  O2 rechecked and was 97% Abdominal:     General: There is no distension.  Musculoskeletal:        General: Normal range of motion.     Cervical back: Neck supple.  Skin:    General: Skin is warm and dry.  Neurological:     Mental Status: She is alert and oriented to person, place, and time.      UC Treatments / Results  Labs (all labs ordered are listed, but only abnormal results are  displayed) Labs Reviewed  CULTURE, GROUP A STREP [facility])  POCT RAPID STREP A (OFFICE)    EKG   Radiology CT CARDIAC SCORING (SELF PAY ONLY)  Addendum Date: 05/10/2020   ADDENDUM REPORT: 05/10/2020 13:21 CLINICAL DATA:  Cardiovascular disease risk stratification EXAM: CT Coronary Calcium Score TECHNIQUE: A gated, non-contrast computed tomography scan of the heart was performed using 42mm slice thickness. Axial images were analyzed on a dedicated workstation. Calcium scoring of the coronary arteries was performed using the Agatston method. FINDINGS: Coronary arteries: Normal origins. Coronary Calcium Score: Left main: 0 Left anterior descending artery: 1, proximal LAD Left circumflex artery: 0 Right coronary artery: 0 Total: 1 Percentile: 53rd Pericardium: Normal. Ascending Aorta: Normal caliber. Ascending aorta measures 2mm at the mid ascending aorta measured in an axial plane. Non-cardiac: See separate report from [facility]. IMPRESSION: Coronary calcium score of 1. This was 53rd percentile for age-, race-, and sex-matched controls. RECOMMENDATIONS: Coronary artery calcium (CAC) score is a strong predictor of incident coronary heart disease (CHD) and provides predictive information beyond traditional risk factors. CAC scoring is reasonable to use in the decision to withhold, postpone, or initiate statin therapy in intermediate-risk or selected borderline-risk asymptomatic adults (age 85-75 years and LDL-C >=70 to <190 mg/dL) who do not have diabetes or established atherosclerotic cardiovascular disease (ASCVD).* In intermediate-risk (10-year ASCVD risk >=7.5% to <20%) adults or selected borderline-risk (10-year ASCVD risk >=5% to <7.5%) adults in whom a CAC score is measured for the purpose of making a treatment decision the following recommendations have been made: If CAC=0, it is reasonable to withhold statin therapy and reassess in 5 to 10 years, as long as higher risk conditions are absent  (diabetes mellitus, family history of premature CHD in first degree relatives (males <55 years; females <65 years), cigarette smoking, or LDL >=190 mg/dL). If CAC is 1 to 99, it is reasonable to initiate statin therapy for patients >=55  years of age. If CAC is >=100 or >=75th percentile, it is reasonable to initiate statin therapy at any age. Cardiology referral should be considered for patients with CAC scores >=400 or >=75th percentile. *2018 AHA/ACC/AACVPR/AAPA/ABC/ACPM/ADA/AGS/APhA/ASPC/NLA/PCNA Guideline on the Management of Blood Cholesterol: A Report of the American College of Cardiology/American Heart Association Task Force on Clinical Practice Guidelines. J Am Coll Cardiol. 2019;73(24):3168-3209. Electronically Signed   By: Cherlynn Kaiser   On: 05/10/2020 13:21   Result Date: 05/10/2020 EXAM: OVER-READ INTERPRETATION  CT CHEST The following report is an over-read performed by radiologist Dr. Rolm Baptise of [facility], [place] on 05/10/2020. This over-read does not include interpretation of cardiac or coronary anatomy or pathology. The coronary calcium score interpretation by the cardiologist is attached. COMPARISON:  02/20/2020 FINDINGS: Vascular: Heart is normal size. Aorta normal caliber. Scattered calcifications in the descending thoracic aorta. Mediastinum/Nodes: No adenopathy Lungs/Pleura: Again noted is moderate to severe pulmonary fibrosis and bronchiectasis. No change since prior study. No effusions. Upper Abdomen: Imaging into the upper abdomen demonstrates no acute findings. Musculoskeletal: Chest wall soft tissues are unremarkable. No acute bony abnormality. IMPRESSION: Stable pulmonary fibrosis and bronchiectasis. Scattered descending aortic atherosclerosis. No acute findings. Electronically Signed: By: Rolm Baptise M.D. On: 05/10/2020 09:15   ECHOCARDIOGRAM COMPLETE  Result Date: 05/10/2020    ECHOCARDIOGRAM REPORT   Patient Name:   ALEXIZ COTHRAN Date of Exam: 05/10/2020 Medical Rec  #:  [id number]     Height:       64.0 in Accession #:    [id number]    Weight:       155.2 lb Date of Birth:  09-01-1950      BSA:          1.756 m Patient Age:    48 years      BP:           149/79 mmHg Patient Gender: F             HR:           92 bpm. Exam Location:  [address] Procedure: 2D Echo, 3D Echo, Cardiac Doppler and Color Doppler Indications:    I27.2 Pulmonary Hypertension  History:        Patient has prior history of Echocardiogram examinations, most                 recent 01/03/2016. Pulmonary HTN; Risk Factors:Hypertension,                 Family History of Coronary Artery Disease, Diabetes and Former                 Smoker. Pulmonary Fibrosis, Von Willebrand Disease.  Sonographer:    Deliah Boston RDCS Referring Phys: [place]  1. Left ventricular ejection fraction, by estimation, is 65 to 70%. Left ventricular ejection fraction by 3D volume is 72 %. The left ventricle has normal function. The left ventricle has no regional wall motion abnormalities. There is mild asymmetric left ventricular hypertrophy of the basal-septal segment. Left ventricular diastolic parameters are consistent with Grade I diastolic dysfunction (impaired relaxation).  2. Right ventricular systolic function is normal. The right ventricular size is normal. There is normal pulmonary artery systolic pressure. The estimated right ventricular systolic pressure is 70.3 mmHg.  3. Left atrial size was mildly dilated.  4. The mitral valve is normal in structure. Trivial mitral valve regurgitation.  5. The aortic valve is tricuspid. There is mild thickening of the  aortic valve. Aortic valve regurgitation is not visualized. No aortic stenosis is present.  6. The inferior vena cava is normal in size with greater than 50% respiratory variability, suggesting right atrial pressure of 3 mmHg. Comparison(s): Compared to prior echo in 2017, there is no significant change. FINDINGS  Left Ventricle: Left ventricular  ejection fraction, by estimation, is 65 to 70%. Left ventricular ejection fraction by 3D volume is 72 %. The left ventricle has normal function. The left ventricle has no regional wall motion abnormalities. The left ventricular internal cavity size was normal in size. There is mild asymmetric left ventricular hypertrophy of the basal-septal segment. Left ventricular diastolic parameters are consistent with Grade I diastolic dysfunction (impaired relaxation). Right Ventricle: The right ventricular size is normal. No increase in right ventricular wall thickness. Right ventricular systolic function is normal. There is normal pulmonary artery systolic pressure. The tricuspid regurgitant velocity is 2.55 m/s, and  with an assumed right atrial pressure of 3 mmHg, the estimated right ventricular systolic pressure is 31.5 mmHg. Left Atrium: Left atrial size was mildly dilated. Right Atrium: Right atrial size was normal in size. Pericardium: There is no evidence of pericardial effusion. Mitral Valve: The mitral valve is normal in structure. There is mild thickening of the mitral valve leaflet(s). There is mild calcification of the mitral valve leaflet(s). Mild to moderate mitral annular calcification. Trivial mitral valve regurgitation. Tricuspid Valve: The tricuspid valve is normal in structure. Tricuspid valve regurgitation is trivial. Aortic Valve: The aortic valve is tricuspid. There is mild thickening of the aortic valve. Aortic valve regurgitation is not visualized. No aortic stenosis is present. Pulmonic Valve: The pulmonic valve was not well visualized. Pulmonic valve regurgitation is not visualized. Aorta: The aortic root and ascending aorta are structurally normal, with no evidence of dilitation. Venous: The inferior vena cava is normal in size with greater than 50% respiratory variability, suggesting right atrial pressure of 3 mmHg. IAS/Shunts: No atrial level shunt detected by color flow Doppler.  LEFT VENTRICLE  PLAX 2D LVIDd:         3.70 cm         Diastology LVIDs:         2.30 cm         LV e' medial:    8.58 cm/s LV PW:         1.10 cm         LV E/e' medial:  11.4 LV IVS:        0.90 cm         LV e' lateral:   11.20 cm/s LVOT diam:     2.30 cm         LV E/e' lateral: 8.7 LV SV:         84 LV SV Index:   48 LVOT Area:     4.15 cm        3D Volume EF                                LV 3D EF:    Left                                             ventricular  ejection                                             fraction by                                             3D volume                                             is 72 %.                                 3D Volume EF:                                3D EF:        72 %                                LV EDV:       117 ml                                LV ESV:       33 ml                                LV SV:        84 ml RIGHT VENTRICLE RV S prime:     13.70 cm/s TAPSE (M-mode): 2.4 cm LEFT ATRIUM             Index       RIGHT ATRIUM           Index LA diam:        3.80 cm 2.16 cm/m  RA Area:     16.30 cm LA Vol (A2C):   54.5 ml 31.03 ml/m RA Volume:   42.90 ml  24.42 ml/m LA Vol (A4C):   63.0 ml 35.87 ml/m LA Biplane Vol: 58.7 ml 33.42 ml/m  AORTIC VALVE LVOT Vmax:   92.20 cm/s LVOT Vmean:  55.700 cm/s LVOT VTI:    0.203 m  AORTA Ao Root diam: 3.00 cm Ao Asc diam:  3.10 cm MITRAL VALVE                TRICUSPID VALVE MV Area (PHT): cm          TR Peak grad:   26.0 mmHg MV Decel Time: 194 msec     TR Vmax:        255.00 cm/s MV E velocity: 97.50 cm/s MV A velocity: 122.00 cm/s  SHUNTS MV E/A ratio:  0.80         Systemic VTI:  0.20 m                             Systemic Diam: 2.30  cm Gwyndolyn Kaufman MD Electronically signed by Gwyndolyn Kaufman MD Signature Date/Time: 05/10/2020/2:53:15 PM    Final     Procedures Procedures (including critical care time)  Medications Ordered in UC Medications - No data to  display  Initial Impression / Assessment and Plan / UC Course  I have reviewed the triage vital signs and the nursing notes.  Pertinent labs & imaging results that were available during my care of the patient were reviewed by me and considered in my medical decision making (see chart for details).     Strep negative, chest CT 2 days ago showing pulmonary fibrosis/bronchiectasis, no pneumonia, vital signs stable today, given reported fevers, sinus pressure along with history, opting to go ahead and initiate on Augmentin and continue symptomatic and supportive care of cough and congestion.  Push fluids.  Discussed strict return precautions. Patient verbalized understanding and is agreeable with plan.  Final Clinical Impressions(s) / UC Diagnoses   Final diagnoses:  Acute non-recurrent maxillary sinusitis     Discharge Instructions     Strep negative Begin Augmentin twice daily for 1 week to treat sinus infection Continue chlorpheniramine, Flonase nasal spray 1 to 2 spray in each nostril daily Tessalon every 8 hours for cough Rest and drink plenty of fluids Tylenol for sore throat, fevers Follow-up if not improving or worsening    ED Prescriptions    Medication Sig Dispense Auth. Provider   benzonatate (TESSALON) 200 MG capsule Take 1 capsule (200 mg total) by mouth 3 (three) times daily as needed for up to 7 days for cough. 28 capsule Corin Formisano C, PA-C   amoxicillin-clavulanate (AUGMENTIN) 875-125 MG tablet Take 1 tablet by mouth every 12 (twelve) hours for 7 days. With food 14 tablet Jahira Swiss C, PA-C   fluticasone (FLONASE) 50 MCG/ACT nasal spray Place 1-2 sprays into both nostrils daily. 16 g Isaac Dubie, [place] C, PA-C     PDMP not reviewed this encounter.   Janith Lima, [place] 05/12/20 [phone]

## 2020-05-12 NOTE — ED Triage Notes (Signed)
Pt present sore throat with chills and neck pain. Symptoms started on Wednesday. Pt took some over the counter for pain relief but symptoms has not gotten better. Pt states her neck is tender to the touch.

## 2020-05-12 NOTE — Discharge Instructions (Addendum)
Strep negative Begin Augmentin twice daily for 1 week to treat sinus infection Continue chlorpheniramine, Flonase nasal spray 1 to 2 spray in each nostril daily Tessalon every 8 hours for cough Rest and drink plenty of fluids Tylenol for sore throat, fevers Follow-up if not improving or worsening

## 2020-05-14 ENCOUNTER — Telehealth: Payer: Self-pay | Admitting: Pulmonary Disease

## 2020-05-14 ENCOUNTER — Other Ambulatory Visit (INDEPENDENT_AMBULATORY_CARE_PROVIDER_SITE_OTHER): Payer: Medicare PPO

## 2020-05-14 DIAGNOSIS — Z5181 Encounter for therapeutic drug level monitoring: Secondary | ICD-10-CM

## 2020-05-14 DIAGNOSIS — J849 Interstitial pulmonary disease, unspecified: Secondary | ICD-10-CM | POA: Diagnosis not present

## 2020-05-14 LAB — COMPREHENSIVE METABOLIC PANEL
ALT: 9 U/L (ref 0–35)
AST: 18 U/L (ref 0–37)
Albumin: 3.3 g/dL — ABNORMAL LOW (ref 3.5–5.2)
Alkaline Phosphatase: 76 U/L (ref 39–117)
BUN: 17 mg/dL (ref 6–23)
CO2: 24 mEq/L (ref 19–32)
Calcium: 8.8 mg/dL (ref 8.4–10.5)
Chloride: 95 mEq/L — ABNORMAL LOW (ref 96–112)
Creatinine, Ser: 1.07 mg/dL (ref 0.40–1.20)
GFR: 52.93 mL/min — ABNORMAL LOW (ref >60.00)
Glucose, Bld: 108 mg/dL — ABNORMAL HIGH (ref 70–99)
Potassium: 3.6 mEq/L (ref 3.5–5.1)
Sodium: 126 mEq/L — ABNORMAL LOW (ref 135–145)
Total Bilirubin: 0.3 mg/dL (ref 0.2–1.2)
Total Protein: 8.5 g/dL — ABNORMAL HIGH (ref 6.0–8.3)

## 2020-05-14 NOTE — Telephone Encounter (Signed)
I cannot tell if symptoms are due to infection that she is taking the antibiotics for or if this a reaction to esbriet. Please tell her to stop the esbriet for now and we can discuss at office visit later this week.

## 2020-05-14 NOTE — Telephone Encounter (Signed)
LMTCB for the pt 

## 2020-05-14 NOTE — Telephone Encounter (Signed)
I have called the pt and she is aware of PM recs and that she will stop the esbriet for now and she will keep the appt with PM on 04/21

## 2020-05-14 NOTE — Telephone Encounter (Signed)
Spoke with patient who stated she was calling based on a new medication that she has been taking that is Esbriet that Dr. Vaughan Browner prescribed and she has been having bad side effects to the rx such as nausea, sleepiness, headache, and off balanced. Patient states she started taking the 3 tablets of Esbriet TID this morning and is now having balance issues and stumbling at times to which patient stated she had a little bit of this issue last week but its more so today than last week. Patient went to urgent care this past Saturday due to pressure around the eyes and ran a fever of 103 the night before. Patient was prescribed Augmentin BID for 7 days and patient started taking it yesterday.  Patient only took the But-acetaminophen-caffeine once last week for the headache and stated it helped but patient wants Dr Matilde Bash input as to is it ok to take as needed with being on esbriet and Augmentin.  Patient took home COVID test which was negative this past Friday.  Patient states she is going today to get her COVID 2nd booster shot today also.  Patient has appointment already scheduled for Friday 05/17/2020 with Dr Vaughan Browner.  Dr Vaughan Browner please advise.

## 2020-05-15 LAB — CULTURE, GROUP A STREP (THRC)

## 2020-05-16 DIAGNOSIS — K5900 Constipation, unspecified: Secondary | ICD-10-CM | POA: Diagnosis not present

## 2020-05-16 DIAGNOSIS — T887XXA Unspecified adverse effect of drug or medicament, initial encounter: Secondary | ICD-10-CM | POA: Diagnosis not present

## 2020-05-16 DIAGNOSIS — R634 Abnormal weight loss: Secondary | ICD-10-CM | POA: Diagnosis not present

## 2020-05-16 DIAGNOSIS — K648 Other hemorrhoids: Secondary | ICD-10-CM | POA: Diagnosis not present

## 2020-05-17 ENCOUNTER — Ambulatory Visit: Payer: Medicare PPO | Admitting: Pulmonary Disease

## 2020-05-17 ENCOUNTER — Other Ambulatory Visit: Payer: Self-pay

## 2020-05-17 ENCOUNTER — Encounter: Payer: Self-pay | Admitting: Pulmonary Disease

## 2020-05-17 VITALS — BP 140/78 | HR 88 | Temp 97.3°F | Ht 64.0 in | Wt 151.8 lb

## 2020-05-17 DIAGNOSIS — J849 Interstitial pulmonary disease, unspecified: Secondary | ICD-10-CM | POA: Diagnosis not present

## 2020-05-17 NOTE — Patient Instructions (Signed)
Sorry not feeling well and have side effects to the medication We will hold off restarting the medication for now Return to clinic in 3 months when we will reassess.

## 2020-05-17 NOTE — Progress Notes (Signed)
Brandy Medina    [id number]    April 12, 1950  Primary Care Physician:Husain, Denton Ar, MD  Referring Physician: Wenda Low, MD [place] [employer] [place] [address],  [place] [address]  Chief complaint: Follow up for interstitial lung disease, Sjogren's syndrome  HPI: Patient with history of von Willebrand's disease, Sjogren's syndrome, maintained on Plaquenil which is stopped due to darkening skin.  Started on Imuran by Dr. 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  She has done cushions in the living room couch but exposures is minimal Smoking history: 9  pack-year smoker.  Quit in 1995 Travel history: Originally from [place].  No significant recent travel Relevant family history: No family history of lung disease  Interim history: Azathioprine was held in February 2022 due to pancytopenia, elevated LFTs.  She is undergoing work-up with Dr. Lindi Adie for this.  She does have formal diagnosis of von Willebrand's disease but on repeat testing does not have any evidence of this.  She may need a bone marrow biopsy  She started Esbriet on 04/26/2020.  It was initially well-tolerated but last week she developed side effects of dizziness, lightheadedness, headache, loss of appetite.  Around the same time she had a temperature of 103 and was evaluated at urgent care and given Augmentin which she did not complete.  She has held the [place] and is now feeling better.  Outpatient Encounter Medications as of 05/17/2020  Medication Sig  . acetaminophen (TYLENOL) 325 MG tablet Take 650 mg by mouth every 6 (six) hours  as needed for headache.  Marland Kitchen amLODipine (NORVASC) 5 MG tablet Take 5 mg by mouth daily.  . benzonatate (TESSALON) 200 MG capsule Take 1 capsule (200 mg total) by mouth 3 (three) times daily as needed for up to 7 days for cough.  . butalbital-acetaminophen-caffeine (FIORICET WITH CODEINE) 50-325-40-30 MG capsule Take 1 capsule by mouth every 8 (eight) hours as needed for headache.  . chlorpheniramine (CHLOR-TRIMETON) 4 MG tablet 1 at bedtime and 1 every 4 hours as needed during the day  . desmopressin (DDAVP NASAL) 0.01 % solution PLACE 1 SPRAY INTO THE NOSE AS DIRECTED ONCE A DAY AT TIME OF PROCEDURES/SURGERY  . dextromethorphan (DELSYM) 30 MG/5ML liquid 2 every 12 hours as needed  . Ergocalciferol (VITAMIN D2) 2000 units TABS Take 1 tablet by mouth 2 (two) times a week.  . fluticasone (FLONASE) 50 MCG/ACT nasal spray Place 1-2 sprays into both nostrils daily.  Marland Kitchen losartan (COZAAR) 100 MG tablet Take 1 tablet by mouth daily.  . metFORMIN (GLUCOPHAGE) 500 MG tablet Take 500 mg by mouth daily with breakfast.   . pilocarpine (SALAGEN) 5 MG tablet Take 5 mg by mouth in the morning, at noon, and at bedtime.  Marland Kitchen azaTHIOprine (IMURAN) 50 MG tablet Take 50 mg by mouth in the morning and at bedtime. (Patient not taking: Reported on 05/17/2020)  . Pirfenidone 267 MG TABS TAKE 1 TAB BY MOUTH THREE TIMES DAILY FOR 7 DAYS, THEN TAKE 2 TABS THREE TIMES DAILY FOR 7 DAYS, THEN TAKE 3 TABS THREE TIMES DAILY THEREAFTER (Patient not taking: Reported on 05/17/2020)  . Respiratory Therapy Supplies (  FLUTTER) DEVI Use as directed. (Patient not taking: Reported on 05/17/2020)  . [DISCONTINUED] amoxicillin-clavulanate (AUGMENTIN) 875-125 MG tablet Take 1 tablet by mouth every 12 (twelve) hours for 7 days. With food  . [DISCONTINUED] ferrous sulfate 325 (65 FE) MG tablet Take 325 mg by mouth in the morning and at bedtime.  . [DISCONTINUED] folic acid (FOLVITE) 276 MCG tablet Take 400 mcg by mouth daily.   Facility-Administered  Encounter Medications as of 05/17/2020  Medication  . [START ON 06/08/2020] desmopressin (DDAVP) 20.4 mcg in sodium chloride 0.9 % 50 mL IVPB  . [START ON 06/08/2020] desmopressin (DDAVP) 20.4 mcg in sodium chloride 0.9 % 50 mL IVPB  , Physical Exam: Blood pressure 140/78, pulse 88, temperature (!) 97.3 F (36.3 C), temperature source Temporal, height '5\' 4"'  (1.626 m), weight 151 lb 12.8 oz (68.9 kg), SpO2 96 %. 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  Worsened since previous scan in 2010, groundglass opacities in the peripheral right upper lobe.  I have reviewed the images personally.  PFTs: 04/05/2020 FVC 2.66 [108%], FEV1 2.18 [114%], DLCO 8.23 [41%] Severe diffusion defect  Labs: CMP 05/14/2020- AST 18, ALT 9  Assessment:  UIP fibrosis Sjogren's syndrome She has progressive fibrosis as noted on the CT scan and worsening PFTs compared to 2016 in 2021  She has not tolerated Esbriet.  I am not sure if the side effects were due to the Esbriet medication or infection as she had a temperature of 103 around the same time. Nonetheless she wants to avoid a retrial at this time.  She is also currently off Imuran which was started by rheumatology  I will see her back in clinic in 3 months when we can reevaluate.  We can consider Ofev. We had previously held off on Ofev due to reported history of von Willebrand's disease and bleeding diathesis but this does not appear to be the case on work-up by Dr.  Lindi Adie.  She is still in the process of completing the work-up with possible bone marrow biopsy to evaluate cytopenias.  Plan/Recommendations: Hold off on Esbriet Reevaluate in 3 months  Marshell Garfinkel MD [facility] 05/17/2020, 8:59 AM  CC: Wenda Low, MD

## 2020-05-18 ENCOUNTER — Other Ambulatory Visit (HOSPITAL_COMMUNITY): Payer: Self-pay

## 2020-05-21 ENCOUNTER — Other Ambulatory Visit (HOSPITAL_COMMUNITY): Payer: Self-pay

## 2020-05-29 ENCOUNTER — Encounter (HOSPITAL_COMMUNITY): Payer: Self-pay | Admitting: Gastroenterology

## 2020-05-29 ENCOUNTER — Other Ambulatory Visit (HOSPITAL_COMMUNITY)
Admission: RE | Admit: 2020-05-29 | Discharge: 2020-05-29 | Disposition: A | Discharge status: Home / Self Care | Payer: Medicare PPO | Source: Ambulatory Visit | Attending: Gastroenterology | Admitting: Gastroenterology

## 2020-05-29 ENCOUNTER — Other Ambulatory Visit: Payer: Self-pay

## 2020-05-29 DIAGNOSIS — Z20822 Contact with and (suspected) exposure to covid-19: Secondary | ICD-10-CM | POA: Diagnosis not present

## 2020-05-29 DIAGNOSIS — Z01812 Encounter for preprocedural laboratory examination: Secondary | ICD-10-CM | POA: Diagnosis not present

## 2020-05-30 LAB — SARS CORONAVIRUS 2 (TAT 6-24 HRS): SARS Coronavirus 2: NEGATIVE

## 2020-05-31 NOTE — Anesthesia Preprocedure Evaluation (Addendum)
Anesthesia Evaluation  Patient identified by MRN, date of birth, ID band Patient awake    Reviewed: Allergy & Precautions, NPO status , Patient's Chart, lab work & pertinent test results  History of Anesthesia Complications (+) PONV  Airway Mallampati: II  TM Distance: >3 FB Neck ROM: Full    Dental no notable dental hx.    Pulmonary former smoker,  ILD   Pulmonary exam normal breath sounds clear to auscultation       Cardiovascular hypertension, Normal cardiovascular exam Rhythm:Regular Rate:Normal     Neuro/Psych negative neurological ROS  negative psych ROS   GI/Hepatic negative GI ROS, Neg liver ROS,   Endo/Other  diabetes  Renal/GU negative Renal ROS  negative genitourinary   Musculoskeletal negative musculoskeletal ROS (+)   Abdominal   Peds negative pediatric ROS (+)  Hematology  (+) Blood dyscrasia, ,   Anesthesia Other Findings   Reproductive/Obstetrics negative OB ROS                            Anesthesia Physical Anesthesia Plan  ASA: II  Anesthesia Plan: MAC   Post-op Pain Management:    Induction: Intravenous  PONV Risk Score and Plan: 3 and Propofol infusion and Treatment may vary due to age or medical condition  Airway Management Planned: Simple Face Mask  Additional Equipment:   Intra-op Plan:   Post-operative Plan:   Informed Consent: I have reviewed the patients History and Physical, chart, labs and discussed the procedure including the risks, benefits and alternatives for the proposed anesthesia with the patient or authorized representative who has indicated his/her understanding and acceptance.     Dental advisory given  Plan Discussed with: CRNA and Surgeon  Anesthesia Plan Comments:         Anesthesia Quick Evaluation

## 2020-06-01 ENCOUNTER — Encounter (HOSPITAL_COMMUNITY): Payer: Self-pay | Admitting: Gastroenterology

## 2020-06-01 ENCOUNTER — Ambulatory Visit (HOSPITAL_COMMUNITY): Payer: Medicare PPO | Admitting: Anesthesiology

## 2020-06-01 ENCOUNTER — Encounter (HOSPITAL_COMMUNITY)
Admission: RE | Disposition: A | Discharge status: Home / Self Care | Payer: Self-pay | Source: Home / Self Care | Attending: Gastroenterology

## 2020-06-01 ENCOUNTER — Other Ambulatory Visit: Payer: Self-pay

## 2020-06-01 ENCOUNTER — Ambulatory Visit (HOSPITAL_COMMUNITY)
Admission: RE | Admit: 2020-06-01 | Discharge: 2020-06-01 | Disposition: A | Discharge status: Home / Self Care | Payer: Medicare PPO | Attending: Gastroenterology | Admitting: Gastroenterology

## 2020-06-01 DIAGNOSIS — D123 Benign neoplasm of transverse colon: Secondary | ICD-10-CM | POA: Diagnosis not present

## 2020-06-01 DIAGNOSIS — Z885 Allergy status to narcotic agent status: Secondary | ICD-10-CM | POA: Diagnosis not present

## 2020-06-01 DIAGNOSIS — K5730 Diverticulosis of large intestine without perforation or abscess without bleeding: Secondary | ICD-10-CM | POA: Insufficient documentation

## 2020-06-01 DIAGNOSIS — K621 Rectal polyp: Secondary | ICD-10-CM | POA: Insufficient documentation

## 2020-06-01 DIAGNOSIS — K644 Residual hemorrhoidal skin tags: Secondary | ICD-10-CM | POA: Diagnosis not present

## 2020-06-01 DIAGNOSIS — K449 Diaphragmatic hernia without obstruction or gangrene: Secondary | ICD-10-CM | POA: Diagnosis not present

## 2020-06-01 DIAGNOSIS — Z888 Allergy status to other drugs, medicaments and biological substances status: Secondary | ICD-10-CM | POA: Insufficient documentation

## 2020-06-01 DIAGNOSIS — I10 Essential (primary) hypertension: Secondary | ICD-10-CM | POA: Diagnosis not present

## 2020-06-01 DIAGNOSIS — Z882 Allergy status to sulfonamides status: Secondary | ICD-10-CM | POA: Insufficient documentation

## 2020-06-01 DIAGNOSIS — K648 Other hemorrhoids: Secondary | ICD-10-CM | POA: Diagnosis not present

## 2020-06-01 DIAGNOSIS — D132 Benign neoplasm of duodenum: Secondary | ICD-10-CM | POA: Diagnosis not present

## 2020-06-01 DIAGNOSIS — D509 Iron deficiency anemia, unspecified: Secondary | ICD-10-CM | POA: Diagnosis not present

## 2020-06-01 DIAGNOSIS — Z79899 Other long term (current) drug therapy: Secondary | ICD-10-CM | POA: Diagnosis not present

## 2020-06-01 DIAGNOSIS — Z886 Allergy status to analgesic agent status: Secondary | ICD-10-CM | POA: Diagnosis not present

## 2020-06-01 DIAGNOSIS — Z87891 Personal history of nicotine dependence: Secondary | ICD-10-CM | POA: Diagnosis not present

## 2020-06-01 DIAGNOSIS — Z7984 Long term (current) use of oral hypoglycemic drugs: Secondary | ICD-10-CM | POA: Diagnosis not present

## 2020-06-01 DIAGNOSIS — R634 Abnormal weight loss: Secondary | ICD-10-CM | POA: Insufficient documentation

## 2020-06-01 DIAGNOSIS — Z881 Allergy status to other antibiotic agents status: Secondary | ICD-10-CM | POA: Diagnosis not present

## 2020-06-01 DIAGNOSIS — K5900 Constipation, unspecified: Secondary | ICD-10-CM | POA: Insufficient documentation

## 2020-06-01 DIAGNOSIS — E119 Type 2 diabetes mellitus without complications: Secondary | ICD-10-CM | POA: Diagnosis not present

## 2020-06-01 HISTORY — PX: COLONOSCOPY WITH PROPOFOL: SHX5780

## 2020-06-01 HISTORY — DX: Pulmonary fibrosis, unspecified: J84.10

## 2020-06-01 HISTORY — PX: ESOPHAGOGASTRODUODENOSCOPY (EGD) WITH PROPOFOL: SHX5813

## 2020-06-01 HISTORY — DX: Other specified postprocedural states: Z98.890

## 2020-06-01 HISTORY — DX: Nausea with vomiting, unspecified: R11.2

## 2020-06-01 HISTORY — PX: POLYPECTOMY: SHX5525

## 2020-06-01 HISTORY — PX: BIOPSY: SHX5522

## 2020-06-01 LAB — GLUCOSE, CAPILLARY: Glucose-Capillary: 97 mg/dL (ref 70–99)

## 2020-06-01 SURGERY — COLONOSCOPY WITH PROPOFOL
Anesthesia: Monitor Anesthesia Care

## 2020-06-01 MED ORDER — PHENYLEPHRINE 40 MCG/ML (10ML) SYRINGE FOR IV PUSH (FOR BLOOD PRESSURE SUPPORT)
PREFILLED_SYRINGE | INTRAVENOUS | Status: DC | PRN
  Administered 2020-06-01: 120 ug via INTRAVENOUS
  Administered 2020-06-01: 80 ug via INTRAVENOUS
  Administered 2020-06-01: 120 ug via INTRAVENOUS
  Administered 2020-06-01 (×2): 80 ug via INTRAVENOUS

## 2020-06-01 MED ORDER — PROPOFOL 1000 MG/100ML IV EMUL
INTRAVENOUS | Status: AC
  Filled 2020-06-01: qty 100

## 2020-06-01 MED ORDER — PROPOFOL 500 MG/50ML IV EMUL
INTRAVENOUS | Status: DC | PRN
  Administered 2020-06-01: 125 ug/kg/min via INTRAVENOUS

## 2020-06-01 MED ORDER — LACTATED RINGERS IV SOLN: INTRAVENOUS | Status: DC

## 2020-06-01 MED ORDER — SODIUM CHLORIDE 0.9 % IV SOLN: INTRAVENOUS | Status: DC

## 2020-06-01 MED ORDER — PROPOFOL 10 MG/ML IV BOLUS
INTRAVENOUS | Status: DC | PRN
  Administered 2020-06-01: 10 mg via INTRAVENOUS
  Administered 2020-06-01: 20 mg via INTRAVENOUS
  Administered 2020-06-01 (×2): 10 mg via INTRAVENOUS
  Administered 2020-06-01: 20 mg via INTRAVENOUS
  Administered 2020-06-01 (×2): 10 mg via INTRAVENOUS

## 2020-06-01 MED ORDER — LIDOCAINE 2% (20 MG/ML) 5 ML SYRINGE
INTRAMUSCULAR | Status: DC | PRN
  Administered 2020-06-01: 60 mg via INTRAVENOUS

## 2020-06-01 SURGICAL SUPPLY — 25 items

## 2020-06-01 NOTE — Discharge Instructions (Signed)

## 2020-06-01 NOTE — Op Note (Signed)
[facility] Patient Name: Brandy Medina Procedure Date: 06/01/2020 MRN: [id number] Attending MD: [name] , MD Date of Birth: [birth year] CSN: [id number] Age: 70 Admit Type: Outpatient Procedure:                Upper GI endoscopy Indications:              Unexplained iron deficiency anemia Providers:                [name], MD, Carmie End, RN, Theodora Blow,                            Technician Referring MD:             Rhina Brackett Medicines:                Monitored Anesthesia Care Complications:            No immediate complications. Estimated blood loss:                            Minimal. Estimated Blood Loss:     Estimated blood loss was minimal. Procedure:                Pre-Anesthesia Assessment:                           - Prior to the procedure, a History and Physical                            was performed, and patient medications and                            allergies were reviewed. The patient's tolerance of                            previous anesthesia was also reviewed. The risks                            and benefits of the procedure and the sedation                            options and risks were discussed with the patient.                            All questions were answered, and informed consent                            was obtained. Prior Anticoagulants: The patient has                            taken no previous anticoagulant or antiplatelet                            agents. ASA Grade Assessment: II - A patient with  mild systemic disease. After reviewing the risks                            and benefits, the patient was deemed in                            satisfactory condition to undergo the procedure.                           After obtaining informed consent, the endoscope was                            passed under direct vision. Throughout the                            procedure, the patient's  blood pressure, pulse, and                            oxygen saturations were monitored continuously. The                            [id number]) Olympus gastroscope was                            introduced through the mouth, and advanced to the                            fourth part of duodenum. The upper GI endoscopy was                            accomplished without difficulty. The patient                            tolerated the procedure well. Scope In: Scope Out: Findings:      The upper third of the esophagus, middle third of the esophagus and       lower third of the esophagus were normal.      The Z-line was regular.      A 5 cm hiatal hernia was present.      The entire examined stomach was otherwise normal.      The cardia and gastric fundus were otherwise normal on retroflexion.      The duodenal bulb, first portion of the duodenum and second portion of       the duodenum were normal. Biopsies for histology were taken with a cold       forceps for evaluation of celiac disease.      A single 10 mm sessile polyp with no bleeding was found in the third       portion of the duodenum. The polyp was removed with a piecemeal       technique using a hot snare. Resection and retrieval were complete. Impression:               - Normal upper third of esophagus, middle third of  esophagus and lower third of esophagus.                           - Z-line regular.                           - 5 cm hiatal hernia.                           - Normal stomach.                           - Normal duodenal bulb, first portion of the                            duodenum and second portion of the duodenum.                            Biopsied.                           - A single duodenal polyp. Resected and retrieved. Moderate Sedation:      Patient did not receive moderate sedation for this procedure, but       instead received monitored anesthesia  care. Recommendation:           - Patient has a contact number available for                            emergencies. The signs and symptoms of potential                            delayed complications were discussed with the                            patient. Return to normal activities tomorrow.                            Written discharge instructions were provided to the                            patient.                           - Resume regular diet.                           - Continue present medications.                           - Await pathology results.                           - Perform a colonoscopy today. Procedure Code(s):        --- Professional ---                           [phone], Esophagogastroduodenoscopy, flexible,  transoral; with removal of tumor(s), polyp(s), or                            other lesion(s) by snare technique                           [id number], 59, Esophagogastroduodenoscopy, flexible,                            transoral; with biopsy, single or multiple Diagnosis Code(s):        --- Professional ---                           K44.9, Diaphragmatic hernia without obstruction or                            gangrene                           K31.7, Polyp of stomach and duodenum                           D50.9, Iron deficiency anemia, unspecified CPT copyright 2019 [employer] Medical Association. All rights reserved. The codes documented in this report are preliminary and upon coder review may  be revised to meet current compliance requirements. [name], MD 06/01/2020 8:31:59 AM This report has been signed electronically. Number of Addenda: 0

## 2020-06-01 NOTE — Interval H&P Note (Signed)
History and Physical Interval Note: 69/female with IDA for an EGD and colonoscopy with propofol.  06/01/2020 7:38 AM  Brandy Medina  has presented today for EGD and colonoscopy, with the diagnosis of D50.9 Iron deficency anemia.  The various methods of treatment have been discussed with the patient and family. After consideration of risks, benefits and other options for treatment, the patient has consented to  Procedure(s): COLONOSCOPY WITH PROPOFOL (N/A) ESOPHAGOGASTRODUODENOSCOPY (EGD) WITH PROPOFOL (N/A) as a surgical intervention.  The patient's history has been reviewed, patient examined, no change in status, stable for surgery.  I have reviewed the patient's chart and labs.  Questions were answered to the patient's satisfaction.     Brandy Medina

## 2020-06-01 NOTE — Brief Op Note (Signed)
06/01/2020  8:35 AM  PATIENT:  Brandy Medina  70 y.o. female  PRE-OPERATIVE DIAGNOSIS:  D50.9 Iron deficency anemia  POST-OPERATIVE DIAGNOSIS:  EGD: duodenum biopsy and polypectomy Colon: diverticulosis, transverse colon polypectomy, rectal polyp x2  PROCEDURE:  Procedure(s): COLONOSCOPY WITH PROPOFOL (N/A) ESOPHAGOGASTRODUODENOSCOPY (EGD) WITH PROPOFOL (N/A) BIOPSY POLYPECTOMY  SURGEON:  Surgeon(s) and Role:    [name], MD - Primary  PHYSICIAN ASSISTANT:   ASSISTANTS: Carmie End, RN, Brooke Person, RN, Theodora Blow, Tech  ANESTHESIA:   topical and MAC  EBL:  Minimal  BLOOD ADMINISTERED:none  DRAINS: none   LOCAL MEDICATIONS USED:  NONE  SPECIMEN:  Biopsy / Limited Resection  DISPOSITION OF SPECIMEN:  PATHOLOGY  COUNTS:  YES  TOURNIQUET:  * No tourniquets in log *  DICTATION: .Dragon Dictation  PLAN OF CARE: Discharge to home after PACU  PATIENT DISPOSITION:  PACU - hemodynamically stable.   Delay start of Pharmacological VTE agent (>24hrs) due to surgical blood loss or risk of bleeding: not applicable

## 2020-06-01 NOTE — H&P (Signed)
General:          OV 05/16/20: 70 year old female, established patient of Dr. Therisa Doyne, returns for follow-up of anemia and von Willebrand's disease (Dx by Dr. Ralene Ok 20 years ago). She underwent recent evaluation by hematologist Dr. Lindi Adie, last office visit 05/08/20. Dr. Lindi Adie discovered that patient did NOT have von Willebrand's disease based on lab results. He advise that she did NOT need desmopressin (DDAVP) prior to EGD and colonoscopy procedures. Dr. Lindi Adie thought her anemia was due to chronic inflammation likely from previous pneumonia. Her LFTs were elevated which he thought was due to azathioprine, which was held. Patient has follow-up with rheumatology for Sjrogren's 06/07/20. Dr. Lindi Adie planned to monitor her anemia and considered doing bone marrow biopsy and or giving Aranesp if anemia worsened.        Lab work done 04/17/20 showed hemoglobin 7.9, platelets 388, MCV 92, iron saturation 33%, elevated LFTs with alkaline phosphatase 496, AST 55, ALT 66. **Lab work done 05/14/20 showed improved, normal LFTs with alkaline phosphatase 76, AST 18, ALT 9. LFTs normalized and Anemia improved off of azathioprine. Lab work done 05/08/20 showed improved stable white count 4.1, hemoglobin 8.3, hematocrit 26, MCV 97, platelets 266.         FOBT FIT Test was Negative 04/12/20. Medical history significant for chronic anemia (many years), bronchiectasis, type 2 diabetes, Sjogren's syndrome, IBS, and GERD.         Last colonoscopy done by Dr. Wynetta Emery 07/2011 showed 3 small hyperplastic polyps removed and diverticulosis. 10 year repeat screening colonoscopy was recommended. Colonoscopy in 2003 was normal. No previous EGD.         Abdominal pelvic CT done 12/2018 showed no acute abnormality. Incidental small hiatal hernia, diverticulosis, and benign 1.5 centimeter hepatic cyst.         Current GI symptoms: She had recent flare up of large external swollen hemorrhoid. She has been using OTC preparation H and Tucks pads  with benefit. The hemorrhoid has decreased in size. Since stopping azathioprine, her epigastric discomfort, nausea, and dyspepsia has resolved. Appetite has improved. She is still worried about weight loss. She has stopped iron tablet as advised by Dr. Payton Mccallum. Continues to have intermittent constipation. She has taken OTC Senokot Colace and laxative as needed. She has not been taking MiraLAX. She is currently scheduled for EGD and colonoscopy with Dr. Therisa Doyne 06/01/20.      ROS:      GI PROCEDURE:          Pacemaker/ AICD no.  Artificial heart valves no.  MI/heart attack no.  Abnormal heart rhythm no.  Angina no.  CVA no.  Hypertension YES.  Hypotension no.  Asthma, COPD no.  Sleep apnea no.  Seizure disorders no.  Artificial joints no.  Severe DJD no.  Diabetes no.  Significant headaches no.  Vertigo no.  Depression/anxiety no.  Abnormal bleeding YES.  Kidney Disease no.  Liver disease no.  Chance of pregnancy no.  Blood transfusion YES.               Medical History:  Labile hypertension; hypertension, Chronic sinusitis, ENT, Postmenopausal, Iron deficiency anemia, Hgb 10 range, Reactive hypoglycemia, Migraine headaches, Chronic right lower quadrant abdominal pain, Diverticulosis on colonoscopy in 2013, GERD, H/H, Numbness of the first three fingers in the right hand possibly due to carpal tunnel syndrome, Brochectasis on CT scan- followed by Pul Dr byrum,- PFT- 2021- ok - CT chest, Plantar fasciitis, BMD in 2006; BMD 2019- Normal, vitamin D  deficiency, Diabetes type 2- on metformin 2016, Shoulder right frozen- injection- Dr Tamera Punt- ortho, mild low Na, Rheum-beekman, pulm-byrum, dentist-bell, opth-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 Lindi Adie- neg- reactive- only- 2017, Dr. Landry Mellow GYN, IBS w/o diarrhea, screen colonoscopy 07/2011 ([facility]): hyperpl p, ILD- CT- scan- worse- Pul fibrosis - Dr Concepcion Living, Low hemoglobin, Workup by Dr. Lindi Adie 04/2020 is NEGATIVE  for VonWillebrand's Disease..       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 Wynetta Emery - a few benign hyperplastic polyps; repeat in 10 years. , cauthery nasal - nose bleed Dr Benjamine Mola ENT 2013; 2018.       Hospitalization/Major Diagnostic Procedure: Not within last yr, no recent ER visits 03/2020, [place] [facility]- sinus pressure 05/12/2020, Not in the past few days 05/16/2020.       Family History:  Father: deceased 25 yrs, ASHD, diagnosed with Coronary artery disease.  Mother: deceased 72 yrs, AODM, diverticulosis, diagnosed with Diabetes.  Daughter(s): alive.  Son(s): alive, pyloric stenosis status post surgery.  6 brother(s) , 6 sister(s) . 1 son(s) , 1 daughter(s) . .   1 brother-died of a heart attack 1 brother-died of renal dz, stroke 1 brother-with diabetes and renal failure 1 brother-with Parkinson's disease Several Brothers with HTN Sisters with diabetes, adhesions and diverticulosis. 3 brothers and 1 sister deceased,  No Family History of Colon Cancer, Polyps, or Liver Disease.      Social History:      General:          Tobacco use              cigarettes:  Former smoker            Quit in year  1995            Pack-year Hx:  15            Tobacco history last updated  05/16/2020            Additional Findings: Tobacco Non-User  Ex-light cigarette smoker (1-9/day)            Vaping  No         no EXPOSURE TO PASSIVE SMOKE, Quit in 1995.          Alcohol: yes, occasionally, wine or marg.          Caffeine: yes, very little.          no Recreational drug use.          DIET: yes, no particular dietary program.          Exercise: yes.          DENTAL CARE: good.          Marital Status: Married since 1976.          Children: girls, 1, Boys, 1.           EDUCATION: College.          OCCUPATION: retired, teacher/tutoring now.          COMMUNICATION BARRIERS: none.   Patient is a native of [place] who has lived in [place] since 1974.      Medications: Taking Pantoprazole Sodium 40 MG Tablet Delayed Release 1 tablet Orally Once  a day, Taking Pilocarpine HCl 5 MG Tablet 1 tablet Orally Three times a day, Taking OneTouch Verio(Blood Glucose Test) - Strip as directed In Vitro once a day, Notes: One Touch VerioIQ, Taking amLODIPine Besylate 5 Tablet 1 tablet by mouth once a day, Taking Losartan Potassium 100 MG Tablet TAKE 1 TABLET BY MOUTH EVERY DAY , Taking Fioricet(Butalbital-APAP-Caffeine) 50-300-40 MG Capsule 1 capsule as needed Orally every 8 hrs p.r.n. headache, Notes: PRN for mirgraines, Taking Align 4 MG Capsule 1 capsule Orally twice a week, Notes: 4 times a week, Taking Benefiber(Wheat Dextrin) . Powder as directed Orally; Mix in a drink. once a day, Notes: as dircted, Taking Tylenol Extra Strength(APAP) 500 MG Tablet 1 tablet as needed Orally every 6 hrs, Taking Vitamin D 2000 UNIT Tablet 1 tablet with a meal Orally 2 x weekly, Notes: Patient is taking 2 tablets 2-3 times a week, Taking Chlor-Trimeton(Chlorpheniramine Maleate) 4 MG Tablet 1 tablet Orally PRN for sinuses, Taking metFORMIN HCl 500 MG Tablet TAKE 1 TABLET BY MOUTH AT SUPPER , Notes: not taking on regular basis, Not-Taking Famotidine 40 MG Tablet 1 tablet at bedtime Orally Once a day, Not-Taking azaTHIOprine 50 MG Tablet 1 tablet Orally twice a day, Discontinued DDAVP Rhinal Tube(Desmopressin Acetate (Refrig)) 0.01 % Solution 2 puffs Nasally as needed for surgeries or procedures, Medication List reviewed and reconciled with the patient      Allergies: Zestril: headache - Side Effects, Erythromycin: nausea - Allergy, Allegra: headache - Side Effects, Aspirin, Sulfa drugs (for allergy): headache - Side Effects, Codeine (for allergy): nausea - Allergy, Avelox: dizziness/light headed  - Side Effects, ZyrTEC: sleepy - Side Effects, Xyzal: nose bleeds - Side Effects, Azathioprine: Anemia & Elevated LFT's - Side Effects.      Objective:      Vitals: Wt 150.4, Wt change -.6 lb, Ht 64, BMI 25.81, Temp 97.8, Pulse sitting 92, BP sitting 138/88, Oxygen sat % 97.      Examination:      Gastroenterology Exam:         GENERAL APPEARANCE: Well developed, well nourished, no active distress, pleasant, no acute distress.         SCLERA: anicteric.         RESPIRATORY Breath sounds clear to auscultation. No wheezes, rales or rhonchi. Respiration even and unlabored.         CARDIOVASCULAR Normal RRR w/o murmers or gallops. No peripheral edema.         ABDOMEN No masses palpated. Liver and spleen not palpated, normal. Bowel sounds normal, Abdomen not distended; , soft, nontender.         RECTAL: Stool brown, heme-negative. 1 small external hemorrhoid (size of a small pea) which is non-thrombosed and not tender. No rectal masses.Marland Kitchen         PSYCHIATRIC Alert and oriented x3, mood and affect appear very anxious..              Physical Examination:      Chaperone present:          Chaperone present  McCraw,Sheila 05/16/2020 04:58:02 PM > .            Assessment:      Assessment:   1. External hemorrhoid - K64.8 (Primary), Small, nonthrombosed, improving.   2. Constipation - K59.00   3. Weight loss - R63.4   4. Adverse drug reaction - T88.7XXA, Azathioprine caused worsening Anemia, Elevated LFT's, and GI upset.    Plan:       1.  External hemorrhoid  Notes: Gave reassurance. , Recommend OTC Preparation H, Witch Hazel Wipes, Tucks Pads, and Warm Genuine Parts as needed.  Patient Educated with: [email or web address].pdf (Hemorrhoids_NIDDK.pdf).       2. Constipation  Notes: Recommend MiraLAX half capful once daily and Benefiber powder one tablespoon once daily mixed in the same drink. Adjust dose of MiraLAX based on bowel habits. .       3. Weight loss  Notes: Proceed  with EGD and colonoscopy as scheduled with Dr. Therisa Doyne on 06/01/20. She does NOT need DDAVP prior to procedures. Recent workup by Dr. Lindi Adie was NEGATIVE for von Willebrand's disease.       4. Adverse drug reaction  Notes: Recommend she remain off azathioprine (for Sjrogens) indefinitely. She has follow-up with her rheumatologist in the next few weeks.

## 2020-06-01 NOTE — Transfer of Care (Signed)
Immediate Anesthesia Transfer of Care Note  Patient: Brandy Medina  Procedure(s) Performed: COLONOSCOPY WITH PROPOFOL (N/A ) ESOPHAGOGASTRODUODENOSCOPY (EGD) WITH PROPOFOL (N/A ) BIOPSY POLYPECTOMY  Patient Location: Endoscopy Unit  Anesthesia Type:MAC  Level of Consciousness: drowsy and patient cooperative  Airway & Oxygen Therapy: Patient Spontanous Breathing and Patient connected to face mask oxygen  Post-op Assessment: Report given to RN and Post -op Vital signs reviewed and stable  Post vital signs: Reviewed and stable  Last Vitals:  Vitals Value Taken Time  BP    Temp    Pulse    Resp 16 06/01/20 0831  SpO2    Vitals shown include unvalidated device data.  Last Pain:  Vitals:   06/01/20 0659  TempSrc: Oral  PainSc: 0-No pain         Complications: No complications documented.

## 2020-06-01 NOTE — Anesthesia Postprocedure Evaluation (Signed)
Anesthesia Post Note  Patient: Brandy Medina  Procedure(s) Performed: COLONOSCOPY WITH PROPOFOL (N/A ) ESOPHAGOGASTRODUODENOSCOPY (EGD) WITH PROPOFOL (N/A ) BIOPSY POLYPECTOMY     Patient location during evaluation: PACU Anesthesia Type: MAC Level of consciousness: awake and alert Pain management: pain level controlled Vital Signs Assessment: post-procedure vital signs reviewed and stable Respiratory status: spontaneous breathing, nonlabored ventilation, respiratory function stable and patient connected to nasal cannula oxygen Cardiovascular status: stable and blood pressure returned to baseline Postop Assessment: no apparent nausea or vomiting Anesthetic complications: no   No complications documented.  Last Vitals:  Vitals:   06/01/20 0659  BP: 136/86  Pulse: 91  Resp: (!) 21  Temp: 36.7 C  SpO2: 100%    Last Pain:  Vitals:   06/01/20 0659  TempSrc: Oral  PainSc: 0-No pain                 Safira Proffit S

## 2020-06-01 NOTE — Op Note (Signed)
[facility] Patient Name: Brandy Medina Procedure Date: 06/01/2020 MRN: [id number] Attending MD: [name] , MD Date of Birth: 27-Dec-1950 CSN: [id number] Age: 71 Admit Type: Outpatient Procedure:                Colonoscopy Indications:              Last colonoscopy: 2013, Unexplained iron deficiency                            anemia Providers:                [name], MD, Carmie End, RN, Theodora Blow,                            Technician Referring MD:             Rhina Brackett Medicines:                Monitored Anesthesia Care Complications:            No immediate complications. Estimated blood loss:                            Minimal. Estimated Blood Loss:     Estimated blood loss was minimal. Procedure:                Pre-Anesthesia Assessment:                           - Prior to the procedure, a History and Physical                            was performed, and patient medications and                            allergies were reviewed. The patient's tolerance of                            previous anesthesia was also reviewed. The risks                            and benefits of the procedure and the sedation                            options and risks were discussed with the patient.                            All questions were answered, and informed consent                            was obtained. Prior Anticoagulants: The patient has                            taken no previous anticoagulant or antiplatelet                            agents. ASA Grade Assessment: II - A patient  with                            mild systemic disease. After reviewing the risks                            and benefits, the patient was deemed in                            satisfactory condition to undergo the procedure.                           - Prior to the procedure, a History and Physical                            was performed, and patient medications and                             allergies were reviewed. The patient's tolerance of                            previous anesthesia was also reviewed. The risks                            and benefits of the procedure and the sedation                            options and risks were discussed with the patient.                            All questions were answered, and informed consent                            was obtained. Prior Anticoagulants: The patient has                            taken no previous anticoagulant or antiplatelet                            agents. ASA Grade Assessment: II - A patient with                            mild systemic disease. After reviewing the risks                            and benefits, the patient was deemed in                            satisfactory condition to undergo the procedure.                           After obtaining informed consent, the colonoscope  was passed under direct vision. Throughout the                            procedure, the patient's blood pressure, pulse, and                            oxygen saturations were monitored continuously. The                            [id number]-[id number] ([id number]) Olympus pediatric colonscope                            was introduced through the anus and advanced to the                            the cecum, identified by appendiceal orifice and                            ileocecal valve. The colonoscopy was performed                            without difficulty. The patient tolerated the                            procedure well. The quality of the bowel                            preparation was good. Scope In: 8:01:44 AM Scope Out: 8:21:12 AM Scope Withdrawal Time: 0 hours 9 minutes 36 seconds  Total Procedure Duration: 0 hours 19 minutes 28 seconds  Findings:      The perianal and digital rectal examinations were normal.      Multiple small and large-mouthed diverticula were found in the  sigmoid       colon, descending colon, transverse colon, hepatic flexure and ascending       colon.      A 4 mm polyp was found in the transverse colon. The polyp was sessile.       The polyp was removed with a cold biopsy forceps. Resection and       retrieval were complete.      Two sessile polyps were found in the rectum. The polyps were 3 to 4 mm       in size. These polyps were removed with a cold biopsy forceps. Resection       and retrieval were complete.      Non-bleeding internal hemorrhoids were found during retroflexion.      The exam was otherwise without abnormality. Impression:               - Diverticulosis in the sigmoid colon, in the                            descending colon, in the transverse colon, at the                            hepatic flexure and in the ascending colon.                           -  One 4 mm polyp in the transverse colon, removed                            with a cold biopsy forceps. Resected and retrieved.                           - Two 3 to 4 mm polyps in the rectum, removed with                            a cold biopsy forceps. Resected and retrieved.                           - Non-bleeding internal hemorrhoids.                           - The examination was otherwise normal. Moderate Sedation:      Patient did not receive moderate sedation for this procedure, but       instead received monitored anesthesia care. Recommendation:           - Patient has a contact number available for                            emergencies. The signs and symptoms of potential                            delayed complications were discussed with the                            patient. Return to normal activities tomorrow.                            Written discharge instructions were provided to the                            patient.                           - High fiber diet.                           - Continue present medications.                            - Await pathology results.                           - Repeat colonoscopy for surveillance based on                            pathology results. Procedure Code(s):        --- Professional ---                           [phone], Colonoscopy, flexible; with biopsy, single  or multiple Diagnosis Code(s):        --- Professional ---                           K64.8, Other hemorrhoids                           K63.5, Polyp of colon                           K62.1, Rectal polyp                           D50.9, Iron deficiency anemia, unspecified                           K57.30, Diverticulosis of large intestine without                            perforation or abscess without bleeding CPT copyright 2019 American Medical Association. All rights reserved. The codes documented in this report are preliminary and upon coder review may  be revised to meet current compliance requirements. [name], MD 06/01/2020 8:35:13 AM This report has been signed electronically. Number of Addenda: 0

## 2020-06-04 ENCOUNTER — Encounter (HOSPITAL_COMMUNITY): Payer: Self-pay | Admitting: Gastroenterology

## 2020-06-04 LAB — SURGICAL PATHOLOGY

## 2020-06-05 ENCOUNTER — Other Ambulatory Visit (HOSPITAL_COMMUNITY): Payer: Medicare PPO

## 2020-06-08 ENCOUNTER — Ambulatory Visit (HOSPITAL_COMMUNITY): Admit: 2020-06-08 | Payer: Medicare PPO | Admitting: Gastroenterology

## 2020-06-08 ENCOUNTER — Encounter (HOSPITAL_COMMUNITY): Payer: Self-pay

## 2020-06-08 SURGERY — COLONOSCOPY WITH PROPOFOL
Anesthesia: Monitor Anesthesia Care

## 2020-06-10 NOTE — Progress Notes (Signed)
Telephone visit canceled because her labs were not drawn at her rheumatologist because the visit was canceled given her husband's recent COVID infection. She has another appointment coming up on June 10. I will set her up for a telephone visit on June 14 to discuss her labs that were drawn at that visit by rheumatology.

## 2020-06-11 ENCOUNTER — Inpatient Hospital Stay: Payer: Medicare PPO | Attending: Hematology and Oncology | Admitting: Hematology and Oncology

## 2020-06-11 DIAGNOSIS — D649 Anemia, unspecified: Secondary | ICD-10-CM

## 2020-06-11 DIAGNOSIS — D6800 Von Willebrand disease, unspecified: Secondary | ICD-10-CM

## 2020-06-11 NOTE — Assessment & Plan Note (Signed)
Lab review  04/17/2020: Hemoglobin 7.9, MCV 92.2, WBC 4.9, platelets 88, erythropoietin 38.9, ferritin 2445, iron saturation 33%, LDH 196, SPEP: Polyclonal rise in immunoglobulins, no M protein, AST 55, ALT 66, alkaline phosphatase 496, creatinine 1  Anemia due to chronic inflammation (as evidenced by elevated polyclonal gamma globulins and elevated ferritin levels): Recent pneumonia probably is the culprit. We have also held azathioprine for another month.  She had rheumatology appointment Jun 07, 2020.

## 2020-06-11 NOTE — Assessment & Plan Note (Signed)
Patient has an apparent history of von Willebrand disease.(Dr. Brent Bulla)  Lab review 04/17/2020:: 1. von Willebrand factor antigen: 280% 2. factor VIII: 331% 3.  Ristocetin cofactor: 90%  These lab results do not show any evidence of von Willebrand disease. Therefore she does not need desmopressin prior to procedures.

## 2020-06-18 ENCOUNTER — Telehealth: Payer: Self-pay | Admitting: Hematology and Oncology

## 2020-06-18 NOTE — Telephone Encounter (Signed)
Scheduled appointment per 05/16 los. Patient is aware.  

## 2020-07-03 ENCOUNTER — Other Ambulatory Visit (HOSPITAL_COMMUNITY): Payer: Self-pay

## 2020-07-06 DIAGNOSIS — M3500 Sicca syndrome, unspecified: Secondary | ICD-10-CM | POA: Diagnosis not present

## 2020-07-06 DIAGNOSIS — Z6824 Body mass index (BMI) 24.0-24.9, adult: Secondary | ICD-10-CM | POA: Diagnosis not present

## 2020-07-06 DIAGNOSIS — D890 Polyclonal hypergammaglobulinemia: Secondary | ICD-10-CM | POA: Diagnosis not present

## 2020-07-06 DIAGNOSIS — M2550 Pain in unspecified joint: Secondary | ICD-10-CM | POA: Diagnosis not present

## 2020-07-06 DIAGNOSIS — D638 Anemia in other chronic diseases classified elsewhere: Secondary | ICD-10-CM | POA: Diagnosis not present

## 2020-07-06 DIAGNOSIS — J849 Interstitial pulmonary disease, unspecified: Secondary | ICD-10-CM | POA: Diagnosis not present

## 2020-07-09 NOTE — Progress Notes (Signed)
  HEMATOLOGY-ONCOLOGY TELEPHONE VISIT PROGRESS NOTE  I connected with Brandy Medina on 07/10/2020 at  9:15 AM EDT by telephone and verified that I am speaking with the correct person using two identifiers.  I discussed the limitations, risks, security and privacy concerns of performing an evaluation and management service by telephone and the availability of in person appointments.  I also discussed with the patient that there may be a patient responsible charge related to this service. The patient expressed understanding and agreed to proceed.   History of Present Illness: Brandy Medina is a 70 y.o. female with above-mentioned history of iron deficiency anemia and Von Willebrand's disease. She presents over the phone today for follow-up.  She denies any fevers or chills.  She has chronic pain issues but they are manageable.   Observations/Objective: No fevers or chills    Assessment Plan:  Von Willebrand disease ([place]) Patient has an apparent history of von Willebrand disease.  (Dr. Brent Bulla)    Lab review 04/17/2020:: 1.  von Willebrand factor antigen: 280% 2. factor VIII: 331% 3.  Ristocetin cofactor: 90%    These lab results do not show any evidence of von Willebrand disease. Therefore she does not need desmopressin prior to procedures.  Normocytic anemia Lab review  04/17/2020: Hemoglobin 7.9, MCV 92.2, WBC 4.9, platelets 88, erythropoietin 38.9, ferritin 2445, iron saturation 33%, LDH 196, SPEP: Polyclonal rise in immunoglobulins, no M protein, AST 55, ALT 66, alkaline phosphatase 496, creatinine 1 07/06/2020: WBC 3.1, hemoglobin 9.4, MCV 99, platelets 254, ANC 1.7, ESR 41, CRP 1, total protein 9.3, globulins 5.2, rest of the LFTs are normal  Anemia due to chronic inflammation (as evidenced by elevated polyclonal gamma globulins and elevated ferritin levels)  Patient was happy to see that the hemoglobin has improved coming off azathioprine.  She saw Dr. Amil Amen who agreed to keep  her off azathioprine until later on or until she developed symptoms.  Return to clinic in 1 year to review the results of labs for the entire year and to discuss next plan.   I discussed the assessment and treatment plan with the patient. The patient was provided an opportunity to ask questions and all were answered. The patient agreed with the plan and demonstrated an understanding of the instructions. The patient was advised to call back or seek an in-person evaluation if the symptoms worsen or if the condition fails to improve as anticipated.   Total time spent: 11 mins including non-face to face time and time spent for planning, charting and coordination of care  Rulon Eisenmenger, MD 07/10/2020    I, Cloyde Reams Dorshimer, am acting as scribe for Nicholas Lose, MD.  I have reviewed the above documentation for accuracy and completeness, and I agree with the above.

## 2020-07-10 ENCOUNTER — Inpatient Hospital Stay (HOSPITAL_BASED_OUTPATIENT_CLINIC_OR_DEPARTMENT_OTHER): Payer: Medicare PPO | Admitting: Hematology and Oncology

## 2020-07-10 DIAGNOSIS — D680 Von Willebrand's disease: Secondary | ICD-10-CM

## 2020-07-10 DIAGNOSIS — D649 Anemia, unspecified: Secondary | ICD-10-CM

## 2020-07-10 NOTE — Assessment & Plan Note (Signed)
Lab review  04/17/2020: Hemoglobin 7.9, MCV 92.2, WBC 4.9, platelets 88, erythropoietin 38.9, ferritin 2445, iron saturation 33%, LDH 196, SPEP: Polyclonal rise in immunoglobulins, no M protein, AST 55, ALT 66, alkaline phosphatase 496, creatinine 1  Anemia due to chronic inflammation (as evidenced by elevated polyclonal gamma globulins and elevated ferritin levels): Recent pneumonia probably is the culprit.  held azathioprine   She had rheumatology appointment Jun 07, 2020.

## 2020-07-10 NOTE — Assessment & Plan Note (Signed)
Patient has an apparent history of von Willebrand disease.(Dr. Brent Bulla)  Lab review 04/17/2020:: 1. von Willebrand factor antigen: 280% 2. factor VIII: 331% 3.  Ristocetin cofactor: 90%  These lab results do not show any evidence of von Willebrand disease. Therefore she does not need desmopressin prior to procedures.

## 2020-07-16 DIAGNOSIS — Z86010 Personal history of colonic polyps: Secondary | ICD-10-CM | POA: Diagnosis not present

## 2020-07-16 DIAGNOSIS — K5900 Constipation, unspecified: Secondary | ICD-10-CM | POA: Diagnosis not present

## 2020-07-16 DIAGNOSIS — D132 Benign neoplasm of duodenum: Secondary | ICD-10-CM | POA: Diagnosis not present

## 2020-07-16 DIAGNOSIS — D649 Anemia, unspecified: Secondary | ICD-10-CM | POA: Diagnosis not present

## 2020-07-23 DIAGNOSIS — J849 Interstitial pulmonary disease, unspecified: Secondary | ICD-10-CM | POA: Diagnosis not present

## 2020-07-23 DIAGNOSIS — K5900 Constipation, unspecified: Secondary | ICD-10-CM | POA: Diagnosis not present

## 2020-07-23 DIAGNOSIS — E871 Hypo-osmolality and hyponatremia: Secondary | ICD-10-CM | POA: Diagnosis not present

## 2020-07-23 DIAGNOSIS — E1122 Type 2 diabetes mellitus with diabetic chronic kidney disease: Secondary | ICD-10-CM | POA: Diagnosis not present

## 2020-07-23 DIAGNOSIS — N182 Chronic kidney disease, stage 2 (mild): Secondary | ICD-10-CM | POA: Diagnosis not present

## 2020-07-23 DIAGNOSIS — I10 Essential (primary) hypertension: Secondary | ICD-10-CM | POA: Diagnosis not present

## 2020-07-23 DIAGNOSIS — D509 Iron deficiency anemia, unspecified: Secondary | ICD-10-CM | POA: Diagnosis not present

## 2020-07-23 DIAGNOSIS — Z7984 Long term (current) use of oral hypoglycemic drugs: Secondary | ICD-10-CM | POA: Diagnosis not present

## 2020-09-04 DIAGNOSIS — I10 Essential (primary) hypertension: Secondary | ICD-10-CM | POA: Diagnosis not present

## 2020-09-21 DIAGNOSIS — D509 Iron deficiency anemia, unspecified: Secondary | ICD-10-CM | POA: Diagnosis not present

## 2020-09-21 DIAGNOSIS — N182 Chronic kidney disease, stage 2 (mild): Secondary | ICD-10-CM | POA: Diagnosis not present

## 2020-09-21 DIAGNOSIS — I10 Essential (primary) hypertension: Secondary | ICD-10-CM | POA: Diagnosis not present

## 2020-09-21 DIAGNOSIS — E7800 Pure hypercholesterolemia, unspecified: Secondary | ICD-10-CM | POA: Diagnosis not present

## 2020-09-21 DIAGNOSIS — D649 Anemia, unspecified: Secondary | ICD-10-CM | POA: Diagnosis not present

## 2020-09-21 DIAGNOSIS — E119 Type 2 diabetes mellitus without complications: Secondary | ICD-10-CM | POA: Diagnosis not present

## 2020-09-21 DIAGNOSIS — K219 Gastro-esophageal reflux disease without esophagitis: Secondary | ICD-10-CM | POA: Diagnosis not present

## 2020-09-21 DIAGNOSIS — E1122 Type 2 diabetes mellitus with diabetic chronic kidney disease: Secondary | ICD-10-CM | POA: Diagnosis not present

## 2020-09-21 DIAGNOSIS — J479 Bronchiectasis, uncomplicated: Secondary | ICD-10-CM | POA: Diagnosis not present

## 2020-09-27 DIAGNOSIS — Z1231 Encounter for screening mammogram for malignant neoplasm of breast: Secondary | ICD-10-CM | POA: Diagnosis not present

## 2020-10-12 DIAGNOSIS — J849 Interstitial pulmonary disease, unspecified: Secondary | ICD-10-CM | POA: Diagnosis not present

## 2020-10-12 DIAGNOSIS — M3500 Sicca syndrome, unspecified: Secondary | ICD-10-CM | POA: Diagnosis not present

## 2020-10-12 DIAGNOSIS — Z6825 Body mass index (BMI) 25.0-25.9, adult: Secondary | ICD-10-CM | POA: Diagnosis not present

## 2020-10-12 DIAGNOSIS — E663 Overweight: Secondary | ICD-10-CM | POA: Diagnosis not present

## 2020-10-12 DIAGNOSIS — D890 Polyclonal hypergammaglobulinemia: Secondary | ICD-10-CM | POA: Diagnosis not present

## 2020-10-12 DIAGNOSIS — D638 Anemia in other chronic diseases classified elsewhere: Secondary | ICD-10-CM | POA: Diagnosis not present

## 2020-10-25 DIAGNOSIS — Z23 Encounter for immunization: Secondary | ICD-10-CM | POA: Diagnosis not present

## 2020-10-25 DIAGNOSIS — I10 Essential (primary) hypertension: Secondary | ICD-10-CM | POA: Diagnosis not present

## 2020-10-25 DIAGNOSIS — E871 Hypo-osmolality and hyponatremia: Secondary | ICD-10-CM | POA: Diagnosis not present

## 2020-10-25 DIAGNOSIS — N182 Chronic kidney disease, stage 2 (mild): Secondary | ICD-10-CM | POA: Diagnosis not present

## 2020-10-25 DIAGNOSIS — E1122 Type 2 diabetes mellitus with diabetic chronic kidney disease: Secondary | ICD-10-CM | POA: Diagnosis not present

## 2020-10-25 DIAGNOSIS — J849 Interstitial pulmonary disease, unspecified: Secondary | ICD-10-CM | POA: Diagnosis not present

## 2020-10-25 DIAGNOSIS — M542 Cervicalgia: Secondary | ICD-10-CM | POA: Diagnosis not present

## 2020-10-25 DIAGNOSIS — D509 Iron deficiency anemia, unspecified: Secondary | ICD-10-CM | POA: Diagnosis not present

## 2020-10-25 DIAGNOSIS — M3500 Sicca syndrome, unspecified: Secondary | ICD-10-CM | POA: Diagnosis not present

## 2020-11-06 DIAGNOSIS — H25013 Cortical age-related cataract, bilateral: Secondary | ICD-10-CM | POA: Diagnosis not present

## 2020-11-06 DIAGNOSIS — Z79899 Other long term (current) drug therapy: Secondary | ICD-10-CM | POA: Diagnosis not present

## 2020-11-06 DIAGNOSIS — E119 Type 2 diabetes mellitus without complications: Secondary | ICD-10-CM | POA: Diagnosis not present

## 2020-11-06 DIAGNOSIS — H2513 Age-related nuclear cataract, bilateral: Secondary | ICD-10-CM | POA: Diagnosis not present

## 2020-11-12 DIAGNOSIS — R040 Epistaxis: Secondary | ICD-10-CM | POA: Diagnosis not present

## 2020-12-14 DIAGNOSIS — R030 Elevated blood-pressure reading, without diagnosis of hypertension: Secondary | ICD-10-CM | POA: Diagnosis not present

## 2020-12-14 DIAGNOSIS — J019 Acute sinusitis, unspecified: Secondary | ICD-10-CM | POA: Diagnosis not present

## 2020-12-17 DIAGNOSIS — J310 Chronic rhinitis: Secondary | ICD-10-CM | POA: Diagnosis not present

## 2020-12-17 DIAGNOSIS — R040 Epistaxis: Secondary | ICD-10-CM | POA: Diagnosis not present

## 2021-01-04 DIAGNOSIS — N182 Chronic kidney disease, stage 2 (mild): Secondary | ICD-10-CM | POA: Diagnosis not present

## 2021-01-04 DIAGNOSIS — I10 Essential (primary) hypertension: Secondary | ICD-10-CM | POA: Diagnosis not present

## 2021-01-04 DIAGNOSIS — D649 Anemia, unspecified: Secondary | ICD-10-CM | POA: Diagnosis not present

## 2021-01-04 DIAGNOSIS — J479 Bronchiectasis, uncomplicated: Secondary | ICD-10-CM | POA: Diagnosis not present

## 2021-01-04 DIAGNOSIS — E7800 Pure hypercholesterolemia, unspecified: Secondary | ICD-10-CM | POA: Diagnosis not present

## 2021-01-04 DIAGNOSIS — E1122 Type 2 diabetes mellitus with diabetic chronic kidney disease: Secondary | ICD-10-CM | POA: Diagnosis not present

## 2021-01-04 DIAGNOSIS — K219 Gastro-esophageal reflux disease without esophagitis: Secondary | ICD-10-CM | POA: Diagnosis not present

## 2021-01-04 DIAGNOSIS — E119 Type 2 diabetes mellitus without complications: Secondary | ICD-10-CM | POA: Diagnosis not present

## 2021-01-08 DIAGNOSIS — E871 Hypo-osmolality and hyponatremia: Secondary | ICD-10-CM | POA: Diagnosis not present

## 2021-01-08 DIAGNOSIS — E7800 Pure hypercholesterolemia, unspecified: Secondary | ICD-10-CM | POA: Diagnosis not present

## 2021-01-08 DIAGNOSIS — E1122 Type 2 diabetes mellitus with diabetic chronic kidney disease: Secondary | ICD-10-CM | POA: Diagnosis not present

## 2021-01-08 DIAGNOSIS — I10 Essential (primary) hypertension: Secondary | ICD-10-CM | POA: Diagnosis not present

## 2021-01-08 DIAGNOSIS — D509 Iron deficiency anemia, unspecified: Secondary | ICD-10-CM | POA: Diagnosis not present

## 2021-01-08 DIAGNOSIS — Z0000 Encounter for general adult medical examination without abnormal findings: Secondary | ICD-10-CM | POA: Diagnosis not present

## 2021-01-08 DIAGNOSIS — G43909 Migraine, unspecified, not intractable, without status migrainosus: Secondary | ICD-10-CM | POA: Diagnosis not present

## 2021-01-08 DIAGNOSIS — D649 Anemia, unspecified: Secondary | ICD-10-CM | POA: Diagnosis not present

## 2021-01-08 DIAGNOSIS — F411 Generalized anxiety disorder: Secondary | ICD-10-CM | POA: Diagnosis not present

## 2021-01-16 DIAGNOSIS — R040 Epistaxis: Secondary | ICD-10-CM | POA: Diagnosis not present

## 2021-02-15 DIAGNOSIS — J849 Interstitial pulmonary disease, unspecified: Secondary | ICD-10-CM | POA: Diagnosis not present

## 2021-02-15 DIAGNOSIS — D638 Anemia in other chronic diseases classified elsewhere: Secondary | ICD-10-CM | POA: Diagnosis not present

## 2021-02-15 DIAGNOSIS — E663 Overweight: Secondary | ICD-10-CM | POA: Diagnosis not present

## 2021-02-15 DIAGNOSIS — Z6826 Body mass index (BMI) 26.0-26.9, adult: Secondary | ICD-10-CM | POA: Diagnosis not present

## 2021-02-15 DIAGNOSIS — M542 Cervicalgia: Secondary | ICD-10-CM | POA: Diagnosis not present

## 2021-02-15 DIAGNOSIS — D890 Polyclonal hypergammaglobulinemia: Secondary | ICD-10-CM | POA: Diagnosis not present

## 2021-02-15 DIAGNOSIS — M3500 Sicca syndrome, unspecified: Secondary | ICD-10-CM | POA: Diagnosis not present

## 2021-02-18 DIAGNOSIS — I10 Essential (primary) hypertension: Secondary | ICD-10-CM | POA: Diagnosis not present

## 2021-02-25 DIAGNOSIS — N952 Postmenopausal atrophic vaginitis: Secondary | ICD-10-CM | POA: Diagnosis not present

## 2021-02-25 DIAGNOSIS — Z01419 Encounter for gynecological examination (general) (routine) without abnormal findings: Secondary | ICD-10-CM | POA: Diagnosis not present

## 2021-02-26 DIAGNOSIS — M545 Low back pain, unspecified: Secondary | ICD-10-CM | POA: Insufficient documentation

## 2021-02-26 DIAGNOSIS — M542 Cervicalgia: Secondary | ICD-10-CM | POA: Diagnosis not present

## 2021-02-26 DIAGNOSIS — M5451 Vertebrogenic low back pain: Secondary | ICD-10-CM | POA: Diagnosis not present

## 2021-02-26 DIAGNOSIS — M5459 Other low back pain: Secondary | ICD-10-CM | POA: Diagnosis not present

## 2021-02-26 DIAGNOSIS — M5030 Other cervical disc degeneration, unspecified cervical region: Secondary | ICD-10-CM | POA: Diagnosis not present

## 2021-03-16 DIAGNOSIS — I10 Essential (primary) hypertension: Secondary | ICD-10-CM | POA: Diagnosis not present

## 2021-03-16 DIAGNOSIS — M542 Cervicalgia: Secondary | ICD-10-CM | POA: Diagnosis not present

## 2021-03-16 DIAGNOSIS — R350 Frequency of micturition: Secondary | ICD-10-CM | POA: Diagnosis not present

## 2021-03-18 DIAGNOSIS — M542 Cervicalgia: Secondary | ICD-10-CM | POA: Diagnosis not present

## 2021-03-18 DIAGNOSIS — M5451 Vertebrogenic low back pain: Secondary | ICD-10-CM | POA: Diagnosis not present

## 2021-03-25 DIAGNOSIS — M5451 Vertebrogenic low back pain: Secondary | ICD-10-CM | POA: Diagnosis not present

## 2021-03-25 DIAGNOSIS — M542 Cervicalgia: Secondary | ICD-10-CM | POA: Diagnosis not present

## 2021-03-29 DIAGNOSIS — M542 Cervicalgia: Secondary | ICD-10-CM | POA: Diagnosis not present

## 2021-03-29 DIAGNOSIS — M5451 Vertebrogenic low back pain: Secondary | ICD-10-CM | POA: Diagnosis not present

## 2021-04-05 DIAGNOSIS — M5451 Vertebrogenic low back pain: Secondary | ICD-10-CM | POA: Diagnosis not present

## 2021-04-05 DIAGNOSIS — M542 Cervicalgia: Secondary | ICD-10-CM | POA: Diagnosis not present

## 2021-04-08 DIAGNOSIS — M5451 Vertebrogenic low back pain: Secondary | ICD-10-CM | POA: Diagnosis not present

## 2021-04-08 DIAGNOSIS — M542 Cervicalgia: Secondary | ICD-10-CM | POA: Diagnosis not present

## 2021-04-12 DIAGNOSIS — M5459 Other low back pain: Secondary | ICD-10-CM | POA: Diagnosis not present

## 2021-04-12 DIAGNOSIS — M542 Cervicalgia: Secondary | ICD-10-CM | POA: Diagnosis not present

## 2021-05-14 ENCOUNTER — Other Ambulatory Visit: Payer: Self-pay | Admitting: Gastroenterology

## 2021-05-29 ENCOUNTER — Encounter (HOSPITAL_COMMUNITY): Payer: Self-pay | Admitting: Gastroenterology

## 2021-05-29 NOTE — Progress Notes (Signed)
Attempted to obtain medical history via telephone, unable to reach at this time. I left a voicemail to return pre surgical testing department's phone call.  

## 2021-06-03 NOTE — H&P (Signed)
?History of Present Illness  ?General:   ?       71 year old female,  returns for 3 month follow-up of anemia, constipation, hemorrhoids, and weight loss. ?       Colonoscopy done 06/01/20 showed one small adenomatous polyp removed, diverticulosis, and 5 year repeat colonoscopy was recommended. EGD done 06/01/20 showed a 10 millimeter adenomatous polyp removed from the duodenum, 5 centimeter hiatal hernia, otherwise normal. Repeat EGD was recommended in 1 year (at [facility]) to follow-up from the adenomatous duodenal polyp. No source for anemia was found with EGD or colonoscopy. Abdominal pelvic CT done 12/2018 showed no acute abnormality. ?       She had recent telehealth visit with Dr. Lindi Adie, hematologist, 07/10/20. Patient's hemoglobin improved to 9.4 grams, which is her baseline. Previous anemia was attributed to adverse reaction of azathioprine which was discontinued. Anemia improved off azathioprine. 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 ?       Constipation has currently improved. She is taking Benefiber and Align daily with great benefit. Currently having normal bowel movement daily. Her Hemorrhoids resolved. She has had no episodes of rectal bleeding, abdominal pain, or other GI symptoms in the past 3 months. Weight is stable. She is exercising and has good energy. Eating healthy diet. She feels great with no GI symptoms.  ?  ? ?Current Medications  ?Taking  ?Pantoprazole Sodium 40 MG Tablet Delayed Release 1 tablet Orally Once a day ?Pilocarpine HCl 5 MG Tablet 1 tablet Orally Three times a day ?OneTouch Verio(Blood Glucose Test) - Strip as directed In Vitro once a day, Notes: One Touch VerioIQ ?amLODIPine Besylate 5 Tablet 1 tablet by mouth once a day ?Losartan Potassium 100 MG Tablet TAKE 1 TABLET BY MOUTH EVERY DAY  ?Fioricet(Butalbital-APAP-Caffeine) 50-300-40 MG Capsule 1 capsule as needed Orally every 8 hrs p.r.n. headache, Notes: PRN for mirgraines ?Align 4 MG Capsule  1 capsule Orally twice a week, Notes: 4 times a week ?Benefiber(Wheat Dextrin) . Powder as directed Orally; Mix in a drink. once a day, Notes: as dircted ?Tylenol Extra Strength(APAP) 500 MG Tablet 1 tablet as needed Orally every 6 hrs  ?Vitamin D 2000 UNIT Tablet 1 tablet with a meal Orally 2 x weekly, Notes: Patient is taking 2 tablets 2-3 times a week ?Chlor-Trimeton(Chlorpheniramine Maleate) 4 MG Tablet 1 tablet Orally PRN for sinuses ?metFORMIN HCl 500 MG Tablet TAKE 1 TABLET BY MOUTH AT SUPPER , Notes: not taking on regular basis ?   ? ?Medication List reviewed and reconciled with the patient   ?  ? ?Past Medical History  ?     Labile hypertension; hypertension. ?     Chronic sinusitis, ENT. ?     Postmenopausal. ?     Iron deficiency anemia, Hgb 10 range. ?     Reactive hypoglycemia. ?     Migraine headaches. ?     Chronic right lower quadrant abdominal pain. ?     Diverticulosis on colonoscopy in 2013. ?     GERD, H/H. ?     Numbness of the first three fingers in the right hand possibly due to carpal tunnel syndrome. ?     Brochectasis on CT scan- followed by Pul Dr byrum,- PFT- 2021- ok - CT chest. ?     Plantar fasciitis. ?     BMD in 2006; BMD 2019- Normal. ?     vitamin D deficiency. ?     Diabetes type 2- on metformin 2016. ?  Shoulder right frozen- injection- Dr Tamera Punt- ortho. ?     mild low Na. ?     Rheum-beekman, pulm-byrum, dentist-bell, opth-weaver/hecker, ent-teoh. ?     Sjorgen Syndrome- Dr Marijean Bravo-. ?     elevated Protein- w/u with Hematologist Dr. Lindi Adie- neg- reactive- only- 2017. ?     Dr. Landry Mellow GYN. ?     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 ?     screen colonoscopy 07/2011 ([facility]): hyperpl p. ?     ILD- CT- scan- worse- Pul fibrosis - Dr Concepcion Living. ?     Low hemoglobin. ?     Workup by Dr. Lindi Adie 04/2020 is NEGATIVE for VonWillebrand's Disease.. ?  ?  ? ?Surgical History  ?      T & A   ?      hysterectomy vaginal   ?      tubal ligation   ?      breast lump, hematoma on the left breast, benign breast biopsy  left breast        left tube and ovary removed   ? ?      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      C sections twice   ?      temporal artery biopsy  ?      colonoscopy 08/2011 - Dr. Wynetta Emery - a few benign hyperplastic polyps; repeat in 10 years.   ?      cauthery nasal - nose bleed Dr Benjamine Mola ENT 2013; 2018  ?      Colonoscopy 05/2020  ?      Endoscopy 05/2020   ?  ? ?Family History  ?Father: deceased 31 yrs, ASHD, diagnosed with Coronary artery disease  ?Mother: deceased 66 yrs, AODM, diverticulosis, diagnosed with Diabetes  ?Daughter(s): alive  ?Son(s): alive, pyloric stenosis status post surgery  ?6 brother(s) , 6 sister(s) . 1 son(s) , 1 daughter(s) .   ?1 brother-died of a heart attack 1 brother-died of renal dz, stroke 1 brother-with diabetes and renal failure 1 brother-with Parkinson's disease Several Brothers with HTN Sisters with diabetes, adhesions and diverticulosis. 3 brothers and 1 sister deceased,  ?No Family History of Colon Cancer, Polyps, or Liver Disease.  ?  ?  ? ?Social History  ?General:   ?Tobacco use   ?    cigarettes:  Former smoker ?    Quit in year  1995 ?    Pack-year Hx:  15 ?    Tobacco history last updated  07/16/2020 ?    Additional Findings: Tobacco Non-User  Ex-light cigarette smoker (1-9/day) ?    Vaping  No ?no EXPOSURE TO PASSIVE SMOKE, Quit in 1995.  ?Alcohol: yes, occasionally, wine or marg.  ?Caffeine: yes, very little.  ?no Recreational drug use.  ?DIET: yes, no particular dietary program.  ?Exercise: yes.  ?DENTAL CARE: good.  ?Marital Status: Married since 1976.  ?Children: girls, 1, Boys, 1.  ?EDUCATION: College.  ?OCCUPATION: retired, teacher/tutoring now.  ?COMMUNICATION BARRIERS: none.  ? ?Patient is a native of [place] who has lived in [place] since 1974.  ?  ? ?Allergies  ? ?Zestril: headache - Side Effects  ? ?Erythromycin: nausea - Allergy  ? ?Allegra: headache - Side Effects  ? ?Aspirin  ? ?Sulfa drugs (for allergy): headache - Side Effects  ? ?Codeine (for allergy):  nausea - Allergy  ? ?Avelox: dizziness/light headed - Side Effects  ? ?ZyrTEC: sleepy - Side Effects  ? ?Xyzal: nose bleeds - Side Effects  ? ?  Azathioprine: Anemia & Elevated LFT's - Side Effects  ? ?Amlodipine: constipation - Side Effects  ?  ?  ? ?Hospitalization/Major Diagnostic Procedure  ? ?Not within last yr, no recent ER visits 03/2020  ? ?[facility]- sinus pressure 05/12/2020  ? ?Not in the past few years 06/2020  ?  ?  ? ?Review of Systems  ?GI PROCEDURE:   ?       Pacemaker/ AICD no.  Artificial heart valves no.  MI/heart attack no.  Abnormal heart rhythm no.  Angina no.  CVA no.  Hypertension YES.  Hypotension no.  Asthma, COPD no.  Sleep apnea no.  Seizure disorders no.  Artificial joints no.  Severe DJD no.  Diabetes no.  Significant headaches no.  Vertigo no.  Depression/anxiety no.  Abnormal bleeding YES.  Kidney Disease no.  Liver disease no.  Chance of pregnancy no.  Blood transfusion YES.      ?  ? ? ?Vital Signs  ?Wt 148.8, Wt change -1.6 lb, Ht 64, BMI 25.54, Temp 97.2, Pulse sitting 84, BP sitting 148/93, Repeat BP 130/82.  ?  ? ?Examination  ?General Examination: ?       No exam was performed today. Patient is well-appearing. Alert and Oriented x 3. No acute distress. Answers questions appropriately. ?  ?  ? ?Assessments  ?  ? ?1. Chronic anemia - D64.9 (Primary), Stable and improved off azathioprine., Baseline Hgb 9.5g.  ? ?2. Constipation - K59.00, Stable and improved on Benefiber and align probiotic.  ? ?3. History of adenomatous polyp of colon - Z86.010, Plan for 5 year repeat Colonoscopy 05/2025.  ? ?4. 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 Therisa Doyne 2., Plan for one year repeat EGD at [facility] 05/2021.  ?  ? ?Recent EGD and colonoscopy done 05/2020 by Dr. Therisa Doyne showed no source for anemia. She had incidental one small adenomatous colon polyp and one adenomatous duodenal polyp removed with her procedures. Repeat  EGD in one year and repeat colonoscopy in 5 years. Constipation is stable and improved. Hemorrhoids have resolved. Chronic anemia is stable, improved, and followed by hematologist Dr. Lindi Adie. No current

## 2021-06-04 ENCOUNTER — Other Ambulatory Visit: Payer: Self-pay

## 2021-06-04 ENCOUNTER — Encounter (HOSPITAL_COMMUNITY)
Admission: RE | Disposition: A | Discharge status: Home / Self Care | Payer: Self-pay | Source: Ambulatory Visit | Attending: Gastroenterology

## 2021-06-04 ENCOUNTER — Ambulatory Visit (HOSPITAL_COMMUNITY): Payer: Medicare PPO | Admitting: Certified Registered"

## 2021-06-04 ENCOUNTER — Encounter (HOSPITAL_COMMUNITY): Payer: Self-pay | Admitting: Gastroenterology

## 2021-06-04 ENCOUNTER — Ambulatory Visit (HOSPITAL_COMMUNITY)
Admission: RE | Admit: 2021-06-04 | Discharge: 2021-06-04 | Disposition: A | Discharge status: Home / Self Care | Payer: Medicare PPO | Source: Ambulatory Visit | Attending: Gastroenterology | Admitting: Gastroenterology

## 2021-06-04 ENCOUNTER — Ambulatory Visit (HOSPITAL_BASED_OUTPATIENT_CLINIC_OR_DEPARTMENT_OTHER): Payer: Medicare PPO | Admitting: Certified Registered"

## 2021-06-04 DIAGNOSIS — K31A11 Gastric intestinal metaplasia without dysplasia, involving the antrum: Secondary | ICD-10-CM | POA: Insufficient documentation

## 2021-06-04 DIAGNOSIS — K219 Gastro-esophageal reflux disease without esophagitis: Secondary | ICD-10-CM | POA: Diagnosis not present

## 2021-06-04 DIAGNOSIS — I10 Essential (primary) hypertension: Secondary | ICD-10-CM

## 2021-06-04 DIAGNOSIS — Z6826 Body mass index (BMI) 26.0-26.9, adult: Secondary | ICD-10-CM | POA: Insufficient documentation

## 2021-06-04 DIAGNOSIS — K589 Irritable bowel syndrome without diarrhea: Secondary | ICD-10-CM | POA: Diagnosis not present

## 2021-06-04 DIAGNOSIS — K449 Diaphragmatic hernia without obstruction or gangrene: Secondary | ICD-10-CM

## 2021-06-04 DIAGNOSIS — D649 Anemia, unspecified: Secondary | ICD-10-CM | POA: Insufficient documentation

## 2021-06-04 DIAGNOSIS — M3500 Sicca syndrome, unspecified: Secondary | ICD-10-CM | POA: Insufficient documentation

## 2021-06-04 DIAGNOSIS — B9681 Helicobacter pylori [H. pylori] as the cause of diseases classified elsewhere: Secondary | ICD-10-CM | POA: Diagnosis not present

## 2021-06-04 DIAGNOSIS — D132 Benign neoplasm of duodenum: Secondary | ICD-10-CM | POA: Diagnosis not present

## 2021-06-04 DIAGNOSIS — K3189 Other diseases of stomach and duodenum: Secondary | ICD-10-CM | POA: Diagnosis not present

## 2021-06-04 DIAGNOSIS — Z87891 Personal history of nicotine dependence: Secondary | ICD-10-CM | POA: Diagnosis not present

## 2021-06-04 DIAGNOSIS — K2950 Unspecified chronic gastritis without bleeding: Secondary | ICD-10-CM | POA: Insufficient documentation

## 2021-06-04 DIAGNOSIS — Z86010 Personal history of colonic polyps: Secondary | ICD-10-CM | POA: Insufficient documentation

## 2021-06-04 DIAGNOSIS — R634 Abnormal weight loss: Secondary | ICD-10-CM | POA: Insufficient documentation

## 2021-06-04 DIAGNOSIS — Z7984 Long term (current) use of oral hypoglycemic drugs: Secondary | ICD-10-CM | POA: Insufficient documentation

## 2021-06-04 DIAGNOSIS — Z8719 Personal history of other diseases of the digestive system: Secondary | ICD-10-CM | POA: Diagnosis present

## 2021-06-04 DIAGNOSIS — E119 Type 2 diabetes mellitus without complications: Secondary | ICD-10-CM

## 2021-06-04 DIAGNOSIS — K31A0 Gastric intestinal metaplasia, unspecified: Secondary | ICD-10-CM | POA: Diagnosis not present

## 2021-06-04 HISTORY — PX: ESOPHAGOGASTRODUODENOSCOPY: SHX5428

## 2021-06-04 HISTORY — PX: BIOPSY: SHX5522

## 2021-06-04 SURGERY — EGD (ESOPHAGOGASTRODUODENOSCOPY)
Anesthesia: Monitor Anesthesia Care

## 2021-06-04 MED ORDER — PROPOFOL 10 MG/ML IV BOLUS
INTRAVENOUS | Status: AC
  Filled 2021-06-04: qty 20

## 2021-06-04 MED ORDER — PROPOFOL 500 MG/50ML IV EMUL
INTRAVENOUS | Status: AC
  Filled 2021-06-04: qty 50

## 2021-06-04 MED ORDER — PROPOFOL 500 MG/50ML IV EMUL
INTRAVENOUS | Status: DC | PRN
  Administered 2021-06-04: 150 ug/kg/min via INTRAVENOUS

## 2021-06-04 MED ORDER — PROPOFOL 10 MG/ML IV BOLUS
INTRAVENOUS | Status: DC | PRN
  Administered 2021-06-04: 30 mg via INTRAVENOUS

## 2021-06-04 MED ORDER — LIDOCAINE 2% (20 MG/ML) 5 ML SYRINGE
INTRAMUSCULAR | Status: DC | PRN
  Administered 2021-06-04: 80 mg via INTRAVENOUS

## 2021-06-04 MED ORDER — LACTATED RINGERS IV SOLN: INTRAVENOUS | Status: DC

## 2021-06-04 NOTE — Anesthesia Procedure Notes (Signed)
Procedure Name: [place] ?Date/Time: 06/04/2021 9:00 AM ?Performed by: Eben Burow, CRNA ?Pre-anesthesia Checklist: Patient identified, Emergency Drugs available, Suction available, Patient being monitored and Timeout performed ?Oxygen Delivery Method: Simple face mask ?Placement Confirmation: positive ETCO2 ? ? ? ? ?

## 2021-06-04 NOTE — Anesthesia Preprocedure Evaluation (Signed)
Anesthesia Evaluation  ?Patient identified by MRN, date of birth, ID band ?Patient awake ? ? ? ?Reviewed: ?Allergy & Precautions, NPO status , Patient's Chart, lab work & pertinent test results ? ?History of Anesthesia Complications ?Negative for: history of anesthetic complications ? ?Airway ?Mallampati: I ? ?TM Distance: >3 FB ?Neck ROM: Full ? ? ? Dental ? ?(+) Teeth Intact ?  ?Pulmonary ?former smoker,  ?ILD ?  ?Pulmonary exam normal ? ? ? ? ? ? ? Cardiovascular ?hypertension, Pt. on medications ?Normal cardiovascular exam ? ? ?Echo 05/10/20: EF 65-70%, no RWMA, mild LVH of basal-septal segment, g1dd, normal RVSF, normal PASP ?  ?Neuro/Psych ? Headaches,   ? GI/Hepatic ?Neg liver ROS, GERD  ,Duodenal polyp ?  ?Endo/Other  ?diabetes, Oral Hypoglycemic Agents ? Renal/GU ?negative Renal ROS  ?negative genitourinary ?  ?Musculoskeletal ?negative musculoskeletal ROS ?(+)  ? Abdominal ?  ?Peds ? Hematology ? ?(+) Blood dyscrasia (Von Willebrand disease), ,   ?Anesthesia Other Findings ?Sjogren's syndrome ? Reproductive/Obstetrics ? ?  ? ? ? ? ? ? ? ? ? ? ? ? ? ?  ?  ? ? ? ? ? ? ? ? ?Anesthesia Physical ?Anesthesia Plan ? ?ASA: 2 ? ?Anesthesia Plan: MAC  ? ?Post-op Pain Management: Minimal or no pain anticipated  ? ?Induction: Intravenous ? ?PONV Risk Score and Plan: 2 and Propofol infusion, TIVA and Treatment may vary due to age or medical condition ? ?Airway Management Planned: Natural Airway, Nasal Cannula and Simple Face Mask ? ?Additional Equipment: None ? ?Intra-op Plan:  ? ?Post-operative Plan:  ? ?Informed Consent: I have reviewed the patients History and Physical, chart, labs and discussed the procedure including the risks, benefits and alternatives for the proposed anesthesia with the patient or authorized representative who has indicated his/her understanding and acceptance.  ? ? ? ? ? ?Plan Discussed with:  ? ?Anesthesia Plan Comments:   ? ? ? ? ? ? ?Anesthesia Quick  Evaluation ? ?

## 2021-06-04 NOTE — Transfer of Care (Signed)
Immediate Anesthesia Transfer of Care Note ? ?Patient: Brandy Medina ? ?Procedure(s) Performed: ESOPHAGOGASTRODUODENOSCOPY (EGD) ?BIOPSY ? ?Patient Location: PACU and Endoscopy Unit ? ?Anesthesia Type:MAC ? ?Level of Consciousness: awake, alert  and patient cooperative ? ?Airway & Oxygen Therapy: Patient Spontanous Breathing and Patient connected to face mask oxygen ? ?Post-op Assessment: Report given to RN and Post -op Vital signs reviewed and stable ? ?Post vital signs: Reviewed and stable ? ?Last Vitals:  ?Vitals Value Taken Time  ?BP    ?Temp    ?Pulse    ?Resp    ?SpO2    ? ? ?Last Pain:  ?Vitals:  ? 06/04/21 0823  ?TempSrc: Temporal  ?PainSc: 0-No pain  ?   ? ?  ? ?Complications: No notable events documented. ?

## 2021-06-04 NOTE — Anesthesia Postprocedure Evaluation (Signed)
Anesthesia Post Note ? ?Patient: Brandy Medina ? ?Procedure(s) Performed: ESOPHAGOGASTRODUODENOSCOPY (EGD) ?BIOPSY ? ?  ? ?Patient location during evaluation: Endoscopy ?Anesthesia Type: MAC ?Level of consciousness: awake and alert ?Pain management: pain level controlled ?Vital Signs Assessment: post-procedure vital signs reviewed and stable ?Respiratory status: spontaneous breathing, nonlabored ventilation and respiratory function stable ?Cardiovascular status: blood pressure returned to baseline and stable ?Postop Assessment: no apparent nausea or vomiting ?Anesthetic complications: no ? ? ?No notable events documented. ? ?Last Vitals:  ?Vitals:  ? 06/04/21 0940 06/04/21 0950  ?BP: 122/79 128/81  ?Pulse: 67 61  ?Resp: (!) 22 19  ?Temp:    ?SpO2: 100% 98%  ?  ?Last Pain:  ?Vitals:  ? 06/04/21 0950  ?TempSrc:   ?PainSc: 0-No pain  ? ? ?  ?  ?  ?  ?  ?  ? ?Lidia Collum ? ? ? ? ?

## 2021-06-04 NOTE — Op Note (Signed)
[facility] ?Patient Name: Brandy Medina ?Procedure Date: 06/04/2021 ?MRN: [id number] ?Attending MD: [name] , MD ?Date of Birth: 1951-01-03 ?CSN: [id number] ?Age: 71 ?Admit Type: Outpatient ?Procedure:                Upper GI endoscopy ?Indications:              Surveillance procedure, duodenal adenoma was  ?                          removed in 05/2020 ?Providers:                [name], MD, Allayne Gitelman, RN, Janie Billups,  ?                          Technician, Jefm Miles CRNA ?Referring MD:             Rhina Brackett ?Medicines:                Monitored Anesthesia Care ?Complications:            No immediate complications. Estimated blood loss:  ?                          Minimal. ?Estimated Blood Loss:     Estimated blood loss was minimal. ?Procedure:                Pre-Anesthesia Assessment: ?                          - Prior to the procedure, a History and Physical  ?                          was performed, and patient medications and  ?                          allergies were reviewed. The patient's tolerance of  ?                          previous anesthesia was also reviewed. The risks  ?                          and benefits of the procedure and the sedation  ?                          options and risks were discussed with the patient.  ?                          All questions were answered, and informed consent  ?                          was obtained. Prior Anticoagulants: The patient has  ?                          taken no previous anticoagulant or antiplatelet  ?                          agents. ASA  Grade Assessment: III - A patient with  ?                          severe systemic disease. After reviewing the risks  ?                          and benefits, the patient was deemed in  ?                          satisfactory condition to undergo the procedure. ?                          After obtaining informed consent, the endoscope was  ?                          passed under direct  vision. Throughout the  ?                          procedure, the patient's blood pressure, pulse, and  ?                          oxygen saturations were monitored continuously. The  ?                          [id number]-[id number] ([id number]) Olympus endoscope was introduced  ?                          through the mouth, and advanced to the third part  ?                          of duodenum. The upper GI endoscopy was  ?                          accomplished without difficulty. The patient  ?                          tolerated the procedure well. ?Scope In: ?Scope Out: ?Findings: ?     The upper third of the esophagus, middle third of the esophagus and  ?     lower third of the esophagus were normal. ?     The Z-line was regular and was found 35 cm from the incisors. ?     A 3 cm hiatal hernia was present. ?     Diffuse moderately erythematous mucosa without bleeding was found in the  ?     gastric antrum. Biopsies were taken with a cold forceps for Helicobacter  ?     pylori testing. ?     The cardia and gastric fundus were normal on retroflexion. ?     The duodenal bulb, first portion of the duodenum and second portion of  ?     the duodenum were normal. ?     Localized mild mucosal changes characterized by altered texture were  ?     found in the third portion of the duodenum. Biopsies were taken with a  ?     cold forceps for histology. ?Impression:               -  Normal upper third of esophagus, middle third of  ?                          esophagus and lower third of esophagus. ?                          - Z-line regular, 35 cm from the incisors. ?                          - 3 cm hiatal hernia. ?                          - Erythematous mucosa in the antrum. Biopsied. ?                          - Normal duodenal bulb, first portion of the  ?                          duodenum and second portion of the duodenum. ?                          - Mucosal changes in the duodenum. Biopsied. ?Moderate Sedation: ?     Patient did not  receive moderate sedation for this procedure, but  ?     instead received monitored anesthesia care. ?Recommendation:           - Patient has a contact number available for  ?                          emergencies. The signs and symptoms of potential  ?                          delayed complications were discussed with the  ?                          patient. Return to normal activities tomorrow.  ?                          Written discharge instructions were provided to the  ?                          patient. ?                          - Resume regular diet. ?                          - Continue present medications. ?                          - Await pathology results. ?                          - Repeat upper endoscopy in 3 years for  ?  surveillance. ?Procedure Code(s):        --- Professional --- ?                          [phone], Esophagogastroduodenoscopy, flexible,  ?                          transoral; with biopsy, single or multiple ?Diagnosis Code(s):        --- Professional --- ?                          K44.9, Diaphragmatic hernia without obstruction or  ?                          gangrene ?                          K31.89, Other diseases of stomach and duodenum ?CPT copyright 2019 American Medical Association. All rights reserved. ?The codes documented in this report are preliminary and upon coder review may  ?be revised to meet current compliance requirements. ?[name], MD ?06/04/2021 9:18:35 AM ?This report has been signed electronically. ?Number of Addenda: 0 ?

## 2021-06-04 NOTE — Interval H&P Note (Signed)
History and Physical Interval Note: ?70/female with duodenal adenoma removed in 05/2020 for a surveillance Egd with propofol. ? ?06/04/2021 ?8:18 AM ? ?Brandy Medina  has presented today for EGD, with the diagnosis of Adenomatous Duodenal Polyp.  The various methods of treatment have been discussed with the patient and family. After consideration of risks, benefits and other options for treatment, the patient has consented to  Procedure(s): ?ESOPHAGOGASTRODUODENOSCOPY (EGD) (N/A) as a surgical intervention.  The patient's history has been reviewed, patient examined, no change in status, stable for surgery.  I have reviewed the patient's chart and labs.  Questions were answered to the patient's satisfaction.   ? ? ?[name] ? ? ?

## 2021-06-04 NOTE — Discharge Instructions (Addendum)

## 2021-06-06 ENCOUNTER — Encounter (HOSPITAL_COMMUNITY): Payer: Self-pay | Admitting: Gastroenterology

## 2021-06-07 LAB — SURGICAL PATHOLOGY

## 2021-06-17 ENCOUNTER — Other Ambulatory Visit: Payer: Self-pay | Admitting: *Deleted

## 2021-06-17 DIAGNOSIS — D649 Anemia, unspecified: Secondary | ICD-10-CM

## 2021-06-17 DIAGNOSIS — D6800 Von Willebrand disease, unspecified: Secondary | ICD-10-CM

## 2021-06-21 ENCOUNTER — Telehealth: Payer: Self-pay | Admitting: Hematology and Oncology

## 2021-06-21 NOTE — Telephone Encounter (Signed)
.  Called patient to schedule appointment per 5/22 inbasket, patient is aware of date and time.

## 2021-07-09 DIAGNOSIS — I700 Atherosclerosis of aorta: Secondary | ICD-10-CM | POA: Diagnosis not present

## 2021-07-09 DIAGNOSIS — E871 Hypo-osmolality and hyponatremia: Secondary | ICD-10-CM | POA: Diagnosis not present

## 2021-07-09 DIAGNOSIS — E7800 Pure hypercholesterolemia, unspecified: Secondary | ICD-10-CM | POA: Diagnosis not present

## 2021-07-09 DIAGNOSIS — D649 Anemia, unspecified: Secondary | ICD-10-CM | POA: Diagnosis not present

## 2021-07-09 DIAGNOSIS — E1122 Type 2 diabetes mellitus with diabetic chronic kidney disease: Secondary | ICD-10-CM | POA: Diagnosis not present

## 2021-07-09 DIAGNOSIS — I10 Essential (primary) hypertension: Secondary | ICD-10-CM | POA: Diagnosis not present

## 2021-07-09 DIAGNOSIS — J849 Interstitial pulmonary disease, unspecified: Secondary | ICD-10-CM | POA: Diagnosis not present

## 2021-07-09 DIAGNOSIS — N182 Chronic kidney disease, stage 2 (mild): Secondary | ICD-10-CM | POA: Diagnosis not present

## 2021-07-09 DIAGNOSIS — E559 Vitamin D deficiency, unspecified: Secondary | ICD-10-CM | POA: Diagnosis not present

## 2021-07-09 NOTE — Progress Notes (Incomplete)
Patient Care Team: Wenda Low, MD as PCP - General (Internal Medicine)  DIAGNOSIS: No diagnosis found.  SUMMARY OF ONCOLOGIC HISTORY: Oncology History   No history exists.    CHIEF COMPLIANT: Follow-up on iron deficiency anemia   INTERVAL HISTORY: Brandy Medina is 71 y.o. female with above-mentioned history of iron deficiency anemia and Von Willebrand's disease. She presents to the clinic for labs and follow-up.   ALLERGIES:  is allergic to aspirin, avelox [moxifloxacin hcl in nacl], codeine, erythromycin, lisinopril, and sulfonamide derivatives.  MEDICATIONS:  Current Outpatient Medications  Medication Sig Dispense Refill   acetaminophen (TYLENOL) 500 MG tablet Take 500-1,000 mg by mouth every 6 (six) hours as needed for moderate pain or headache.     amLODipine (NORVASC) 5 MG tablet Take 5 mg by mouth daily.     butalbital-acetaminophen-caffeine (FIORICET) 50-325-40 MG tablet Take 1 tablet by mouth 2 (two) times daily as needed for migraine.     chlorpheniramine (CHLOR-TRIMETON) 4 MG tablet Take 4 mg by mouth every 4 (four) hours as needed for allergies.     Cholecalciferol (VITAMIN D) 50 MCG (2000 UT) tablet Take 2,000 Units by mouth 2 (two) times a week. Tues and Thurs     dextromethorphan (DELSYM) 30 MG/5ML liquid Take 30 mg by mouth 2 (two) times daily as needed for cough.     Homeopathic Products (ARNICARE PAIN RELIEF EX) Apply 1 application. topically daily as needed (pain).     loratadine (CLARITIN) 10 MG tablet Take 10 mg by mouth daily as needed for allergies.     metFORMIN (GLUCOPHAGE) 500 MG tablet Take 500 mg by mouth daily after supper.  0   polyethylene glycol (MIRALAX / GLYCOLAX) 17 g packet Take 17 g by mouth daily as needed for moderate constipation.     Polyvinyl Alcohol-Povidone (REFRESH OP) Place 1 drop into both eyes daily as needed (dry eyes).     Respiratory Therapy Supplies (FLUTTER) DEVI Use as directed. 1 each 0   rosuvastatin (CRESTOR) 5 MG tablet  Take 5 mg by mouth every Tuesday.     valsartan (DIOVAN) 320 MG tablet Take 320 mg by mouth daily.     Wheat Dextrin (BENEFIBER PO) Take 1 Scoop by mouth daily.     Current Facility-Administered Medications  Medication Dose Route Frequency Provider Last Rate Last Admin   desmopressin (DDAVP) 20.4 mcg in sodium chloride 0.9 % 50 mL IVPB  0.3 mcg/kg Intravenous Once Salley Slaughter, PA-C       desmopressin (DDAVP) 20.4 mcg in sodium chloride 0.9 % 50 mL IVPB  0.3 mcg/kg Intravenous Once Salley Slaughter, PA-C        PHYSICAL EXAMINATION: ECOG PERFORMANCE STATUS: {CHL ONC ECOG DD:[id number]}  There were no vitals filed for this visit. There were no vitals filed for this visit.  BREAST:*** No palpable masses or nodules in either right or left breasts. No palpable axillary supraclavicular or infraclavicular adenopathy no breast tenderness or nipple discharge. (exam performed in the presence of a chaperone)  LABORATORY DATA:  I have reviewed the data as listed    Latest Ref Rng & Units 05/14/2020    7:58 AM 04/17/2020   11:45 AM 04/06/2020    9:34 AM  CMP  Glucose 70 - 99 mg/dL 108  123  107   BUN 6 - 23 mg/dL '17  9  13   '$ Creatinine 0.40 - 1.20 mg/dL 1.07  1.00  0.99   Sodium 135 - 145 mEq/L 126  131  131   Potassium 3.5 - 5.1 mEq/L 3.6  4.3  4.4   Chloride 96 - 112 mEq/L 95  98  97   CO2 19 - 32 mEq/L '24  23  27   '$ Calcium 8.4 - 10.5 mg/dL 8.8  9.4  9.7   Total Protein 6.0 - 8.3 g/dL 8.5  9.7  9.3   Total Bilirubin 0.2 - 1.2 mg/dL 0.3  0.4  0.4   Alkaline Phos 39 - 117 U/L 76  496  370   AST 0 - 37 U/L 18  55  143   ALT 0 - 35 U/L 9  66  131     Lab Results  Component Value Date   WBC 4.1 05/08/2020   HGB 8.3 (L) 05/08/2020   HCT 26.0 (L) 05/08/2020   MCV 97.4 05/08/2020   PLT 266 05/08/2020   NEUTROABS 2.2 05/08/2020    ASSESSMENT & PLAN:  No problem-specific Assessment & Plan notes found for this encounter.    No orders of the defined types were placed in  this encounter.  The patient has a good understanding of the overall plan. she agrees with it. she will call with any problems that may develop before the next visit here. Total time spent: 30 mins including face to face time and time spent for planning, charting and co-ordination of care   Suzzette Righter, [place] 07/09/21    I Gardiner Coins am scribing for Dr. Lindi Adie  ***

## 2021-07-23 ENCOUNTER — Other Ambulatory Visit: Payer: Self-pay

## 2021-07-23 ENCOUNTER — Inpatient Hospital Stay: Payer: Medicare PPO | Attending: Hematology and Oncology

## 2021-07-23 ENCOUNTER — Inpatient Hospital Stay: Payer: Medicare PPO | Admitting: Hematology and Oncology

## 2021-07-23 DIAGNOSIS — D649 Anemia, unspecified: Secondary | ICD-10-CM | POA: Diagnosis not present

## 2021-07-23 DIAGNOSIS — D638 Anemia in other chronic diseases classified elsewhere: Secondary | ICD-10-CM

## 2021-07-23 DIAGNOSIS — D509 Iron deficiency anemia, unspecified: Secondary | ICD-10-CM | POA: Diagnosis not present

## 2021-07-23 DIAGNOSIS — Z79899 Other long term (current) drug therapy: Secondary | ICD-10-CM | POA: Insufficient documentation

## 2021-07-23 DIAGNOSIS — B999 Unspecified infectious disease: Secondary | ICD-10-CM | POA: Diagnosis not present

## 2021-07-23 DIAGNOSIS — D6800 Von Willebrand disease, unspecified: Secondary | ICD-10-CM | POA: Diagnosis not present

## 2021-07-23 LAB — CBC WITH DIFFERENTIAL (CANCER CENTER ONLY)
Abs Immature Granulocytes: 0.01 10*3/uL (ref 0.00–0.07)
Basophils Absolute: 0 10*3/uL (ref 0.0–0.1)
Basophils Relative: 1 %
Eosinophils Absolute: 0 10*3/uL (ref 0.0–0.5)
Eosinophils Relative: 1 %
HCT: 27.5 % — ABNORMAL LOW (ref 36.0–46.0)
Hemoglobin: 9.1 g/dL — ABNORMAL LOW (ref 12.0–15.0)
Immature Granulocytes: 0 %
Lymphocytes Relative: 31 %
Lymphs Abs: 0.9 10*3/uL (ref 0.7–4.0)
MCH: 31.6 pg (ref 26.0–34.0)
MCHC: 33.1 g/dL (ref 30.0–36.0)
MCV: 95.5 fL (ref 80.0–100.0)
Monocytes Absolute: 0.4 10*3/uL (ref 0.1–1.0)
Monocytes Relative: 12 %
Neutro Abs: 1.6 10*3/uL — ABNORMAL LOW (ref 1.7–7.7)
Neutrophils Relative %: 55 %
Platelet Count: 213 10*3/uL (ref 150–400)
RBC: 2.88 MIL/uL — ABNORMAL LOW (ref 3.87–5.11)
RDW: 14.8 % (ref 11.5–15.5)
WBC Count: 2.9 10*3/uL — ABNORMAL LOW (ref 4.0–10.5)
nRBC: 0 % (ref 0.0–0.2)

## 2021-07-23 LAB — CMP (CANCER CENTER ONLY)
ALT: 14 U/L (ref 0–44)
AST: 20 U/L (ref 15–41)
Albumin: 3.7 g/dL (ref 3.5–5.0)
Alkaline Phosphatase: 63 U/L (ref 38–126)
Anion gap: 3 — ABNORMAL LOW (ref 5–15)
BUN: 19 mg/dL (ref 8–23)
CO2: 27 mmol/L (ref 22–32)
Calcium: 9.7 mg/dL (ref 8.9–10.3)
Chloride: 101 mmol/L (ref 98–111)
Creatinine: 0.91 mg/dL (ref 0.44–1.00)
GFR, Estimated: >60 mL/min (ref >60)
Glucose, Bld: 112 mg/dL — ABNORMAL HIGH (ref 70–99)
Potassium: 4.2 mmol/L (ref 3.5–5.1)
Sodium: 131 mmol/L — ABNORMAL LOW (ref 135–145)
Total Bilirubin: 0.4 mg/dL (ref 0.3–1.2)
Total Protein: 9.1 g/dL — ABNORMAL HIGH (ref 6.5–8.1)

## 2021-07-24 ENCOUNTER — Telehealth: Payer: Self-pay | Admitting: Hematology and Oncology

## 2021-07-24 DIAGNOSIS — A048 Other specified bacterial intestinal infections: Secondary | ICD-10-CM | POA: Diagnosis not present

## 2021-07-24 NOTE — Telephone Encounter (Signed)
Scheduled appointment per 6/27 los. Patient is aware.

## 2021-07-26 ENCOUNTER — Ambulatory Visit: Payer: Medicare PPO | Admitting: Hematology and Oncology

## 2021-07-26 ENCOUNTER — Other Ambulatory Visit: Payer: Medicare PPO

## 2021-07-31 ENCOUNTER — Other Ambulatory Visit: Payer: Self-pay | Admitting: Hematology and Oncology

## 2021-08-05 ENCOUNTER — Encounter: Payer: Self-pay | Admitting: *Deleted

## 2021-08-05 ENCOUNTER — Other Ambulatory Visit: Payer: Self-pay

## 2021-08-05 ENCOUNTER — Inpatient Hospital Stay: Payer: Medicare PPO

## 2021-08-05 ENCOUNTER — Inpatient Hospital Stay: Payer: Medicare PPO | Attending: Hematology and Oncology

## 2021-08-05 VITALS — BP 154/101 | HR 72 | Temp 97.8°F | Resp 16

## 2021-08-05 DIAGNOSIS — D649 Anemia, unspecified: Secondary | ICD-10-CM

## 2021-08-05 DIAGNOSIS — D6800 Von Willebrand disease, unspecified: Secondary | ICD-10-CM | POA: Insufficient documentation

## 2021-08-05 DIAGNOSIS — Z79899 Other long term (current) drug therapy: Secondary | ICD-10-CM | POA: Diagnosis not present

## 2021-08-05 DIAGNOSIS — D472 Monoclonal gammopathy: Secondary | ICD-10-CM | POA: Diagnosis not present

## 2021-08-05 DIAGNOSIS — B999 Unspecified infectious disease: Secondary | ICD-10-CM

## 2021-08-05 LAB — CMP (CANCER CENTER ONLY)
ALT: 14 U/L (ref 0–44)
AST: 20 U/L (ref 15–41)
Albumin: 3.8 g/dL (ref 3.5–5.0)
Alkaline Phosphatase: 66 U/L (ref 38–126)
Anion gap: 4 — ABNORMAL LOW (ref 5–15)
BUN: 22 mg/dL (ref 8–23)
CO2: 28 mmol/L (ref 22–32)
Calcium: 9.4 mg/dL (ref 8.9–10.3)
Chloride: 103 mmol/L (ref 98–111)
Creatinine: 0.91 mg/dL (ref 0.44–1.00)
GFR, Estimated: >60 mL/min (ref >60)
Glucose, Bld: 94 mg/dL (ref 70–99)
Potassium: 4.1 mmol/L (ref 3.5–5.1)
Sodium: 135 mmol/L (ref 135–145)
Total Bilirubin: 0.4 mg/dL (ref 0.3–1.2)
Total Protein: 9.4 g/dL — ABNORMAL HIGH (ref 6.5–8.1)

## 2021-08-05 LAB — CBC WITH DIFFERENTIAL (CANCER CENTER ONLY)
Abs Immature Granulocytes: 0.03 10*3/uL (ref 0.00–0.07)
Basophils Absolute: 0 10*3/uL (ref 0.0–0.1)
Basophils Relative: 0 %
Eosinophils Absolute: 0 10*3/uL (ref 0.0–0.5)
Eosinophils Relative: 1 %
HCT: 29.3 % — ABNORMAL LOW (ref 36.0–46.0)
Hemoglobin: 9.8 g/dL — ABNORMAL LOW (ref 12.0–15.0)
Immature Granulocytes: 1 %
Lymphocytes Relative: 25 %
Lymphs Abs: 0.8 10*3/uL (ref 0.7–4.0)
MCH: 31.9 pg (ref 26.0–34.0)
MCHC: 33.4 g/dL (ref 30.0–36.0)
MCV: 95.4 fL (ref 80.0–100.0)
Monocytes Absolute: 0.4 10*3/uL (ref 0.1–1.0)
Monocytes Relative: 12 %
Neutro Abs: 1.9 10*3/uL (ref 1.7–7.7)
Neutrophils Relative %: 61 %
Platelet Count: 234 10*3/uL (ref 150–400)
RBC: 3.07 MIL/uL — ABNORMAL LOW (ref 3.87–5.11)
RDW: 15 % (ref 11.5–15.5)
WBC Count: 3.1 10*3/uL — ABNORMAL LOW (ref 4.0–10.5)
nRBC: 0 % (ref 0.0–0.2)

## 2021-08-05 LAB — IRON AND IRON BINDING CAPACITY (CC-WL,HP ONLY)
Iron: 90 ug/dL (ref 28–170)
Saturation Ratios: 32 % — ABNORMAL HIGH (ref 10.4–31.8)
TIBC: 284 ug/dL (ref 250–450)
UIBC: 194 ug/dL (ref 148–442)

## 2021-08-05 LAB — FERRITIN: Ferritin: 327 ng/mL — ABNORMAL HIGH (ref 11–307)

## 2021-08-05 MED ORDER — EPOETIN ALFA-EPBX 40000 UNIT/ML IJ SOLN
40000.0000 [IU] | Freq: Once | INTRAMUSCULAR | Status: AC
  Administered 2021-08-05: 40000 [IU] via SUBCUTANEOUS
  Filled 2021-08-05: qty 1

## 2021-08-05 NOTE — Patient Instructions (Signed)
Epoetin Alfa injection ?What is this medication? ?EPOETIN ALFA (e POE e tin AL fa) helps your body make more red blood cells. This medicine is used to treat anemia caused by chronic kidney disease, cancer chemotherapy, or HIV-therapy. It may also be used before surgery if you have anemia. ?This medicine may be used for other purposes; ask your health care provider or pharmacist if you have questions. ?COMMON BRAND NAME(S): Epogen, Procrit, Retacrit ?What should I tell my care team before I take this medication? ?They need to know if you have any of these conditions: ?cancer ?heart disease ?high blood pressure ?history of blood clots ?history of stroke ?low levels of folate, iron, or vitamin B12 in the blood ?seizures ?an unusual or allergic reaction to erythropoietin, albumin, benzyl alcohol, hamster proteins, other medicines, foods, dyes, or preservatives ?pregnant or trying to get pregnant ?breast-feeding ?How should I use this medication? ?This medicine is for injection into a vein or under the skin. It is usually given by a health care professional in a hospital or clinic setting. ?If you get this medicine at home, you will be taught how to prepare and give this medicine. Use exactly as directed. Take your medicine at regular intervals. Do not take your medicine more often than directed. ?It is important that you put your used needles and syringes in a special sharps container. Do not put them in a trash can. If you do not have a sharps container, call your pharmacist or healthcare provider to get one. ?A special MedGuide will be given to you by the pharmacist with each prescription and refill. Be sure to read this information carefully each time. ?Talk to your pediatrician regarding the use of this medicine in children. While this drug may be prescribed for selected conditions, precautions do apply. ?Overdosage: If you think you have taken too much of this medicine contact a poison control center or emergency  room at once. ?NOTE: This medicine is only for you. Do not share this medicine with others. ?What if I miss a dose? ?If you miss a dose, take it as soon as you can. If it is almost time for your next dose, take only that dose. Do not take double or extra doses. ?What may interact with this medication? ?Interactions have not been studied. ?This list may not describe all possible interactions. Give your health care provider a list of all the medicines, herbs, non-prescription drugs, or dietary supplements you use. Also tell them if you smoke, drink alcohol, or use illegal drugs. Some items may interact with your medicine. ?What should I watch for while using this medication? ?Your condition will be monitored carefully while you are receiving this medicine. ?You may need blood work done while you are taking this medicine. ?This medicine may cause a decrease in vitamin B6. You should make sure that you get enough vitamin B6 while you are taking this medicine. Discuss the foods you eat and the vitamins you take with your health care professional. ?What side effects may I notice from receiving this medication? ?Side effects that you should report to your doctor or health care professional as soon as possible: ?allergic reactions like skin rash, itching or hives, swelling of the face, lips, or tongue ?seizures ?signs and symptoms of a blood clot such as breathing problems; changes in vision; chest pain; severe, sudden headache; pain, swelling, warmth in the leg; trouble speaking; sudden numbness or weakness of the face, arm or leg ?signs and symptoms of a stroke like   changes in vision; confusion; trouble speaking or understanding; severe headaches; sudden numbness or weakness of the face, arm or leg; trouble walking; dizziness; loss of balance or coordination ?Side effects that usually do not require medical attention (report to your doctor or health care professional if they continue or are  bothersome): ?chills ?cough ?dizziness ?fever ?headaches ?joint pain ?muscle cramps ?muscle pain ?nausea, vomiting ?pain, redness, or irritation at site where injected ?This list may not describe all possible side effects. Call your doctor for medical advice about side effects. You may report side effects to FDA at 1-800-FDA-1088. ?Where should I keep my medication? ?Keep out of the reach of children. ?Store in a refrigerator between 2 and 8 degrees C (36 and 46 degrees F). Do not freeze or shake. Throw away any unused portion if using a single-dose vial. Multi-dose vials can be kept in the refrigerator for up to 21 days after the initial dose. Throw away unused medicine. ?NOTE: This sheet is a summary. It may not cover all possible information. If you have questions about this medicine, talk to your doctor, pharmacist, or health care provider. ?? 2023 Elsevier/Gold Standard (2016-09-16 00:00:00) ? ?

## 2021-08-05 NOTE — Progress Notes (Signed)
Per MD okay to treat with BP 154/101.

## 2021-08-16 DIAGNOSIS — M542 Cervicalgia: Secondary | ICD-10-CM | POA: Diagnosis not present

## 2021-08-16 DIAGNOSIS — M3501 Sicca syndrome with keratoconjunctivitis: Secondary | ICD-10-CM | POA: Diagnosis not present

## 2021-08-16 DIAGNOSIS — D890 Polyclonal hypergammaglobulinemia: Secondary | ICD-10-CM | POA: Diagnosis not present

## 2021-08-16 DIAGNOSIS — J849 Interstitial pulmonary disease, unspecified: Secondary | ICD-10-CM | POA: Diagnosis not present

## 2021-08-16 DIAGNOSIS — D638 Anemia in other chronic diseases classified elsewhere: Secondary | ICD-10-CM | POA: Diagnosis not present

## 2021-08-19 ENCOUNTER — Other Ambulatory Visit: Payer: Self-pay | Admitting: Pharmacist

## 2021-08-26 ENCOUNTER — Other Ambulatory Visit: Payer: Self-pay

## 2021-08-26 ENCOUNTER — Inpatient Hospital Stay: Payer: Medicare PPO

## 2021-08-26 DIAGNOSIS — D649 Anemia, unspecified: Secondary | ICD-10-CM

## 2021-08-26 DIAGNOSIS — B999 Unspecified infectious disease: Secondary | ICD-10-CM

## 2021-08-26 DIAGNOSIS — D6800 Von Willebrand disease, unspecified: Secondary | ICD-10-CM | POA: Diagnosis not present

## 2021-08-26 DIAGNOSIS — D472 Monoclonal gammopathy: Secondary | ICD-10-CM | POA: Diagnosis not present

## 2021-08-26 DIAGNOSIS — Z79899 Other long term (current) drug therapy: Secondary | ICD-10-CM | POA: Diagnosis not present

## 2021-08-26 LAB — CBC WITH DIFFERENTIAL (CANCER CENTER ONLY)
Abs Immature Granulocytes: 0.01 10*3/uL (ref 0.00–0.07)
Basophils Absolute: 0 10*3/uL (ref 0.0–0.1)
Basophils Relative: 0 %
Eosinophils Absolute: 0 10*3/uL (ref 0.0–0.5)
Eosinophils Relative: 1 %
HCT: 31.8 % — ABNORMAL LOW (ref 36.0–46.0)
Hemoglobin: 10.5 g/dL — ABNORMAL LOW (ref 12.0–15.0)
Immature Granulocytes: 0 %
Lymphocytes Relative: 26 %
Lymphs Abs: 0.8 10*3/uL (ref 0.7–4.0)
MCH: 31.8 pg (ref 26.0–34.0)
MCHC: 33 g/dL (ref 30.0–36.0)
MCV: 96.4 fL (ref 80.0–100.0)
Monocytes Absolute: 0.5 10*3/uL (ref 0.1–1.0)
Monocytes Relative: 15 %
Neutro Abs: 1.9 10*3/uL (ref 1.7–7.7)
Neutrophils Relative %: 58 %
Platelet Count: 239 10*3/uL (ref 150–400)
RBC: 3.3 MIL/uL — ABNORMAL LOW (ref 3.87–5.11)
RDW: 15.3 % (ref 11.5–15.5)
WBC Count: 3.2 10*3/uL — ABNORMAL LOW (ref 4.0–10.5)
nRBC: 0 % (ref 0.0–0.2)

## 2021-08-26 LAB — CMP (CANCER CENTER ONLY)
ALT: 13 U/L (ref 0–44)
AST: 19 U/L (ref 15–41)
Albumin: 4 g/dL (ref 3.5–5.0)
Alkaline Phosphatase: 59 U/L (ref 38–126)
Anion gap: 4 — ABNORMAL LOW (ref 5–15)
BUN: 22 mg/dL (ref 8–23)
CO2: 29 mmol/L (ref 22–32)
Calcium: 10.1 mg/dL (ref 8.9–10.3)
Chloride: 102 mmol/L (ref 98–111)
Creatinine: 0.94 mg/dL (ref 0.44–1.00)
GFR, Estimated: >60 mL/min (ref >60)
Glucose, Bld: 96 mg/dL (ref 70–99)
Potassium: 4.7 mmol/L (ref 3.5–5.1)
Sodium: 135 mmol/L (ref 135–145)
Total Bilirubin: 0.5 mg/dL (ref 0.3–1.2)
Total Protein: 9.9 g/dL — ABNORMAL HIGH (ref 6.5–8.1)

## 2021-08-26 LAB — IRON AND IRON BINDING CAPACITY (CC-WL,HP ONLY)
Iron: 93 ug/dL (ref 28–170)
Saturation Ratios: 31 % (ref 10.4–31.8)
TIBC: 305 ug/dL (ref 250–450)
UIBC: 212 ug/dL (ref 148–442)

## 2021-08-26 LAB — FERRITIN: Ferritin: 288 ng/mL (ref 11–307)

## 2021-08-26 NOTE — Progress Notes (Signed)
Patient does not meet treatment parameters for injection today. HGB above 10. Copy of labs provided to patient. She denies any fatigue, SOB or weakness outside of her normal. She is feeling very well this morning.

## 2021-09-16 ENCOUNTER — Inpatient Hospital Stay: Payer: Medicare PPO

## 2021-09-18 ENCOUNTER — Inpatient Hospital Stay: Payer: Medicare PPO | Attending: Hematology and Oncology

## 2021-09-18 ENCOUNTER — Other Ambulatory Visit: Payer: Self-pay

## 2021-09-18 ENCOUNTER — Inpatient Hospital Stay: Payer: Medicare PPO

## 2021-09-18 VITALS — BP 158/94 | HR 80 | Temp 98.6°F | Resp 18

## 2021-09-18 DIAGNOSIS — Z79899 Other long term (current) drug therapy: Secondary | ICD-10-CM | POA: Insufficient documentation

## 2021-09-18 DIAGNOSIS — B999 Unspecified infectious disease: Secondary | ICD-10-CM

## 2021-09-18 DIAGNOSIS — D649 Anemia, unspecified: Secondary | ICD-10-CM

## 2021-09-18 DIAGNOSIS — D6800 Von Willebrand disease, unspecified: Secondary | ICD-10-CM | POA: Diagnosis not present

## 2021-09-18 DIAGNOSIS — D631 Anemia in chronic kidney disease: Secondary | ICD-10-CM | POA: Diagnosis not present

## 2021-09-18 LAB — CBC WITH DIFFERENTIAL (CANCER CENTER ONLY)
Abs Immature Granulocytes: 0 10*3/uL (ref 0.00–0.07)
Basophils Absolute: 0 10*3/uL (ref 0.0–0.1)
Basophils Relative: 0 %
Eosinophils Absolute: 0 10*3/uL (ref 0.0–0.5)
Eosinophils Relative: 1 %
HCT: 28.1 % — ABNORMAL LOW (ref 36.0–46.0)
Hemoglobin: 9.6 g/dL — ABNORMAL LOW (ref 12.0–15.0)
Immature Granulocytes: 0 %
Lymphocytes Relative: 28 %
Lymphs Abs: 0.9 10*3/uL (ref 0.7–4.0)
MCH: 32 pg (ref 26.0–34.0)
MCHC: 34.2 g/dL (ref 30.0–36.0)
MCV: 93.7 fL (ref 80.0–100.0)
Monocytes Absolute: 0.4 10*3/uL (ref 0.1–1.0)
Monocytes Relative: 12 %
Neutro Abs: 1.9 10*3/uL (ref 1.7–7.7)
Neutrophils Relative %: 59 %
Platelet Count: 205 10*3/uL (ref 150–400)
RBC: 3 MIL/uL — ABNORMAL LOW (ref 3.87–5.11)
RDW: 14.8 % (ref 11.5–15.5)
WBC Count: 3.1 10*3/uL — ABNORMAL LOW (ref 4.0–10.5)
nRBC: 0 % (ref 0.0–0.2)

## 2021-09-18 LAB — IRON AND IRON BINDING CAPACITY (CC-WL,HP ONLY)
Iron: 103 ug/dL (ref 28–170)
Saturation Ratios: 37 % — ABNORMAL HIGH (ref 10.4–31.8)
TIBC: 281 ug/dL (ref 250–450)
UIBC: 178 ug/dL (ref 148–442)

## 2021-09-18 LAB — CMP (CANCER CENTER ONLY)
ALT: 15 U/L (ref 0–44)
AST: 21 U/L (ref 15–41)
Albumin: 3.8 g/dL (ref 3.5–5.0)
Alkaline Phosphatase: 66 U/L (ref 38–126)
Anion gap: 3 — ABNORMAL LOW (ref 5–15)
BUN: 22 mg/dL (ref 8–23)
CO2: 28 mmol/L (ref 22–32)
Calcium: 9.7 mg/dL (ref 8.9–10.3)
Chloride: 103 mmol/L (ref 98–111)
Creatinine: 0.83 mg/dL (ref 0.44–1.00)
GFR, Estimated: >60 mL/min (ref >60)
Glucose, Bld: 106 mg/dL — ABNORMAL HIGH (ref 70–99)
Potassium: 4.3 mmol/L (ref 3.5–5.1)
Sodium: 134 mmol/L — ABNORMAL LOW (ref 135–145)
Total Bilirubin: 0.5 mg/dL (ref 0.3–1.2)
Total Protein: 8.9 g/dL — ABNORMAL HIGH (ref 6.5–8.1)

## 2021-09-18 LAB — FERRITIN: Ferritin: 386 ng/mL — ABNORMAL HIGH (ref 11–307)

## 2021-09-18 MED ORDER — EPOETIN ALFA 20000 UNIT/ML IJ SOLN
40000.0000 [IU] | Freq: Once | INTRAMUSCULAR | Status: AC
  Administered 2021-09-18: 40000 [IU] via SUBCUTANEOUS
  Filled 2021-09-18: qty 2

## 2021-09-18 MED ORDER — EPOETIN ALFA 40000 UNIT/ML IJ SOLN
40000.0000 [IU] | Freq: Once | INTRAMUSCULAR | Status: DC
  Filled 2021-09-18: qty 1

## 2021-09-18 NOTE — Patient Instructions (Signed)

## 2021-09-19 ENCOUNTER — Encounter: Payer: Self-pay | Admitting: Pulmonary Disease

## 2021-09-19 ENCOUNTER — Ambulatory Visit: Payer: Medicare PPO | Admitting: Pulmonary Disease

## 2021-09-19 VITALS — BP 140/84 | HR 81 | Temp 98.1°F | Ht 64.0 in | Wt 163.4 lb

## 2021-09-19 DIAGNOSIS — M50322 Other cervical disc degeneration at C5-C6 level: Secondary | ICD-10-CM | POA: Diagnosis not present

## 2021-09-19 DIAGNOSIS — J849 Interstitial pulmonary disease, unspecified: Secondary | ICD-10-CM | POA: Diagnosis not present

## 2021-09-19 DIAGNOSIS — M5136 Other intervertebral disc degeneration, lumbar region: Secondary | ICD-10-CM | POA: Diagnosis not present

## 2021-09-19 DIAGNOSIS — M9903 Segmental and somatic dysfunction of lumbar region: Secondary | ICD-10-CM | POA: Diagnosis not present

## 2021-09-19 DIAGNOSIS — M9901 Segmental and somatic dysfunction of cervical region: Secondary | ICD-10-CM | POA: Diagnosis not present

## 2021-09-19 DIAGNOSIS — Z5181 Encounter for therapeutic drug level monitoring: Secondary | ICD-10-CM | POA: Diagnosis not present

## 2021-09-19 DIAGNOSIS — R0981 Nasal congestion: Secondary | ICD-10-CM | POA: Diagnosis not present

## 2021-09-19 NOTE — Addendum Note (Signed)
Addended by: Elton Sin on: 09/19/2021 09:27 AM   Modules accepted: Orders

## 2021-09-19 NOTE — Patient Instructions (Signed)
I am glad you are doing stable with your breathing We will give you a new flutter valve, referral to allergy for sinus congestion We will order high-res CT and PFTs at the end of October.  Follow-up a clinic after these tests for review and plan for next steps

## 2021-09-19 NOTE — Progress Notes (Signed)
Brandy Medina    [id number]    1950-06-25  Primary Care Physician:Husain, Denton Ar, MD  Referring Physician: Wenda Low, MD [facility] [address],  [place] [address]  Chief complaint: Follow up for interstitial lung disease, Sjogren's syndrome  HPI: 71 y.o. patient with history of von Willebrand's disease, Sjogren's syndrome, maintained on Plaquenil which is stopped due to darkening skin.  Started on Imuran by Dr. Amil Amen, rheumatology Diagnosed with streptococcal pneumonia in February 2022 which is treated with antibiotics CT scan shows progressive UIP fibrosis and she has been referred to pulmonary clinic for further evaluation  Complains of minimal cough, chronic dyspnea on exertion which is progressive in nature Has dry mouth, dry eyes  Azathioprine was held in February 2022 due to pancytopenia, elevated LFTs.  She is undergoing work-up with Dr. Lindi Adie for this.  She does have formal diagnosis of von Willebrand's disease but on repeat testing does not have any evidence of this.  She may need a bone marrow biopsy  She started Esbriet on 04/26/2020.  It was initially well-tolerated but last week she developed side effects of dizziness, lightheadedness, headache, loss of appetite.  She has held the Marmet and is now feeling better.  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  She has done cushions in the living room couch but exposures is minimal Smoking history: 9  pack-year smoker.  Quit in 1995 Travel history: Originally from [place].  No significant recent travel Relevant family history: No family history of lung disease  Interim history: Continues of Esbriet.  She is also not taking azathioprine and Plaquenil for her Sjogren's Continues follow-up with Dr. Lindi Adie for anemia and is getting Aranesp injections.  States that breathing is stable.  Has sinus congestion and postnasal drip.  She is questing a new  flutter valve.  Outpatient Encounter Medications as of 09/19/2021  Medication Sig   acetaminophen (TYLENOL) 500 MG tablet Take 500-1,000 mg by mouth every 6 (six) hours as needed for moderate pain or headache.   amLODipine (NORVASC) 5 MG tablet Take 5 mg by mouth daily.   butalbital-acetaminophen-caffeine (FIORICET) 50-325-40 MG tablet Take 1 tablet by mouth 2 (two) times daily as needed for migraine.   chlorpheniramine (CHLOR-TRIMETON) 4 MG tablet Take 4 mg by mouth every 4 (four) hours as needed for allergies.   Cholecalciferol (VITAMIN D) 50 MCG (2000 UT) tablet Take 2,000 Units by mouth 2 (two) times a week. Tues and Thurs   dextromethorphan (DELSYM) 30 MG/5ML liquid Take 30 mg by mouth 2 (two) times daily as needed for cough.   epoetin alfa (EPOGEN) 10000 UNIT/ML injection as directed Injection once every 3 weeks for 3 months   Homeopathic Products (ARNICARE PAIN RELIEF EX) Apply 1 application. topically daily as needed (pain).   loratadine (CLARITIN) 10 MG tablet Take 10 mg by mouth daily as needed for allergies.   Polyvinyl Alcohol-Povidone (REFRESH OP) Place 1 drop into both eyes daily as needed (dry eyes).   Respiratory Therapy Supplies (FLUTTER) DEVI Use as directed.   rosuvastatin (CRESTOR) 5 MG tablet Take 5 mg by mouth every Tuesday.   valsartan (DIOVAN) 320 MG tablet Take 320 mg by mouth daily.   Wheat Dextrin (BENEFIBER PO) Take 1 Scoop by mouth daily.   [DISCONTINUED] metFORMIN (GLUCOPHAGE) 500 MG tablet Take 500 mg by mouth daily after supper.   [DISCONTINUED] polyethylene glycol (MIRALAX / GLYCOLAX) 17 g packet Take 17  g by mouth daily as needed for moderate constipation.   Facility-Administered Encounter Medications as of 09/19/2021  Medication   desmopressin (DDAVP) 20.4 mcg in sodium chloride 0.9 % 50 mL IVPB   desmopressin (DDAVP) 20.4 mcg in sodium chloride 0.9 % 50 mL IVPB  , Physical Exam: Blood pressure (!) 140/84, pulse 81, temperature 98.1 F (36.7 C),  temperature source Oral, height '5\' 4"'  (1.626 m), weight 163 lb 6.4 oz (74.1 kg), SpO2 98 %. 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  Worsened since previous scan in 2010, groundglass opacities in the peripheral right upper lobe.  I have reviewed the images personally.  PFTs: 04/05/2020 FVC 2.66 [108%], FEV1 2.18 [114%], DLCO 8.23 [41%] Severe diffusion defect  Labs: CMP 05/14/2020- AST 18, ALT 9  Assessment:  UIP fibrosis Sjogren's syndrome She has progressive fibrosis as noted on the CT scan and worsening PFTs compared to 2016 in 2021  She has not tolerated Esbriet.  I am not sure if the side effects were due to the Esbriet medication or infection as she had a temperature of 103 around the same time. Nonetheless she wants to avoid a retrial at this time. She is also currently off Imuran and Plaquenil which was started by rheumatology.  We discussed retrial of antifibrotic therapy with Ofev or Esbriet but she is very reluctant to try them.  We will get a follow-up high-res CT and PFTs and reevaluate  Sinus congestion, postnasal drip Requesting a referral to allergy.  She has not tolerated steroid nasal sprays in the past due to epistaxis Flutter valve for mucociliary clearance  Plan/Recommendations: Hold off on antifibrotic therapy per patient preference CT, PFTs Referral to allergy Flutter valve Reevaluate in 3 months  Marshell Garfinkel MD [facility] 09/19/2021, 8:56 AM  CC: Wenda Low, MD

## 2021-09-20 DIAGNOSIS — E7800 Pure hypercholesterolemia, unspecified: Secondary | ICD-10-CM | POA: Diagnosis not present

## 2021-09-20 DIAGNOSIS — E1122 Type 2 diabetes mellitus with diabetic chronic kidney disease: Secondary | ICD-10-CM | POA: Diagnosis not present

## 2021-09-20 DIAGNOSIS — D649 Anemia, unspecified: Secondary | ICD-10-CM | POA: Diagnosis not present

## 2021-09-20 DIAGNOSIS — K219 Gastro-esophageal reflux disease without esophagitis: Secondary | ICD-10-CM | POA: Diagnosis not present

## 2021-09-20 DIAGNOSIS — J301 Allergic rhinitis due to pollen: Secondary | ICD-10-CM | POA: Diagnosis not present

## 2021-09-20 DIAGNOSIS — I10 Essential (primary) hypertension: Secondary | ICD-10-CM | POA: Diagnosis not present

## 2021-09-20 DIAGNOSIS — N182 Chronic kidney disease, stage 2 (mild): Secondary | ICD-10-CM | POA: Diagnosis not present

## 2021-09-20 DIAGNOSIS — R519 Headache, unspecified: Secondary | ICD-10-CM | POA: Diagnosis not present

## 2021-10-03 DIAGNOSIS — Z1231 Encounter for screening mammogram for malignant neoplasm of breast: Secondary | ICD-10-CM | POA: Diagnosis not present

## 2021-10-03 NOTE — Progress Notes (Signed)
Patient Care Team: Wenda Low, MD as PCP - General (Internal Medicine)  DIAGNOSIS: No diagnosis found.  SUMMARY OF ONCOLOGIC HISTORY: Oncology History   No history exists.    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 female with above-mentioned history of iron deficiency anemia and Von Willebrand's disease. She presents to the clinic for labs and follow-up.   ALLERGIES:  is allergic to avelox [moxifloxacin hcl in nacl], avelox [moxifloxacin], azathioprine, codeine, erythromycin, fexofenadine, levocetirizine, lisinopril, sulfa antibiotics, sulfonamide derivatives, and zyrtec [cetirizine].  MEDICATIONS:  Current Outpatient Medications  Medication Sig Dispense Refill   acetaminophen (TYLENOL) 500 MG tablet Take 500-1,000 mg by mouth every 6 (six) hours as needed for moderate pain or headache.     amLODipine (NORVASC) 5 MG tablet Take 5 mg by mouth daily.     butalbital-acetaminophen-caffeine (FIORICET) 50-325-40 MG tablet Take 1 tablet by mouth 2 (two) times daily as needed for migraine.     chlorpheniramine (CHLOR-TRIMETON) 4 MG tablet Take 4 mg by mouth every 4 (four) hours as needed for allergies.     Cholecalciferol (VITAMIN D) 50 MCG (2000 UT) tablet Take 2,000 Units by mouth 2 (two) times a week. Tues and Thurs     dextromethorphan (DELSYM) 30 MG/5ML liquid Take 30 mg by mouth 2 (two) times daily as needed for cough.     epoetin alfa (EPOGEN) 10000 UNIT/ML injection as directed Injection once every 3 weeks for 3 months     Homeopathic Products (ARNICARE PAIN RELIEF EX) Apply 1 application. topically daily as needed (pain).     loratadine (CLARITIN) 10 MG tablet Take 10 mg by mouth daily as needed for allergies.     Polyvinyl Alcohol-Povidone (REFRESH OP) Place 1 drop into both eyes daily as needed (dry eyes).     Respiratory Therapy Supplies (FLUTTER) DEVI Use as directed. 1 each 0    rosuvastatin (CRESTOR) 5 MG tablet Take 5 mg by mouth every Tuesday.     valsartan (DIOVAN) 320 MG tablet Take 320 mg by mouth daily.     Wheat Dextrin (BENEFIBER PO) Take 1 Scoop by mouth daily.     Current Facility-Administered Medications  Medication Dose Route Frequency Provider Last Rate Last Admin   desmopressin (DDAVP) 20.4 mcg in sodium chloride 0.9 % 50 mL IVPB  0.3 mcg/kg Intravenous Once Salley Slaughter, PA-C       desmopressin (DDAVP) 20.4 mcg in sodium chloride 0.9 % 50 mL IVPB  0.3 mcg/kg Intravenous Once Salley Slaughter, PA-C        PHYSICAL EXAMINATION: ECOG PERFORMANCE STATUS: {CHL ONC ECOG DZ:[id number]}  There were no vitals filed for this visit. There were no vitals filed for this visit.  BREAST:*** No palpable masses or nodules in either right or left breasts. No palpable axillary supraclavicular or infraclavicular adenopathy no breast tenderness or nipple discharge. (exam performed in the presence of a chaperone)  LABORATORY DATA:  I have reviewed the data as listed    Latest Ref Rng & Units 09/18/2021    8:34 AM 08/26/2021    9:49 AM 08/05/2021   10:07 AM  CMP  Glucose 70 - 99 mg/dL 106  96  94   BUN 8 - 23 mg/dL '22  22  22   '$ Creatinine 0.44 - 1.00 mg/dL 0.83  0.94  0.91   Sodium 135 - 145 mmol/L 134  135  135   Potassium 3.5 - 5.1  mmol/L 4.3  4.7  4.1   Chloride 98 - 111 mmol/L 103  102  103   CO2 22 - 32 mmol/L '28  29  28   '$ Calcium 8.9 - 10.3 mg/dL 9.7  10.1  9.4   Total Protein 6.5 - 8.1 g/dL 8.9  9.9  9.4   Total Bilirubin 0.3 - 1.2 mg/dL 0.5  0.5  0.4   Alkaline Phos 38 - 126 U/L 66  59  66   AST 15 - 41 U/L '21  19  20   '$ ALT 0 - 44 U/L '15  13  14     '$ Lab Results  Component Value Date   WBC 3.1 (L) 09/18/2021   HGB 9.6 (L) 09/18/2021   HCT 28.1 (L) 09/18/2021   MCV 93.7 09/18/2021   PLT 205 09/18/2021   NEUTROABS 1.9 09/18/2021    ASSESSMENT & PLAN:  No problem-specific Assessment & Plan notes found for this encounter.    No  orders of the defined types were placed in this encounter.  The patient has a good understanding of the overall plan. she agrees with it. she will call with any problems that may develop before the next visit here. Total time spent: 30 mins including face to face time and time spent for planning, charting and co-ordination of care   Suzzette Righter, [place] 10/03/21    I Gardiner Coins am scribing for Dr. Lindi Adie  ***

## 2021-10-07 ENCOUNTER — Other Ambulatory Visit: Payer: Self-pay

## 2021-10-07 ENCOUNTER — Inpatient Hospital Stay: Payer: Medicare PPO

## 2021-10-07 ENCOUNTER — Inpatient Hospital Stay: Payer: Medicare PPO | Attending: Hematology and Oncology | Admitting: Hematology and Oncology

## 2021-10-07 DIAGNOSIS — D6800 Von Willebrand disease, unspecified: Secondary | ICD-10-CM | POA: Insufficient documentation

## 2021-10-07 DIAGNOSIS — Z79899 Other long term (current) drug therapy: Secondary | ICD-10-CM | POA: Diagnosis not present

## 2021-10-07 DIAGNOSIS — D649 Anemia, unspecified: Secondary | ICD-10-CM

## 2021-10-07 DIAGNOSIS — D509 Iron deficiency anemia, unspecified: Secondary | ICD-10-CM | POA: Insufficient documentation

## 2021-10-07 DIAGNOSIS — B999 Unspecified infectious disease: Secondary | ICD-10-CM

## 2021-10-07 LAB — CMP (CANCER CENTER ONLY)
ALT: 15 U/L (ref 0–44)
AST: 22 U/L (ref 15–41)
Albumin: 3.9 g/dL (ref 3.5–5.0)
Alkaline Phosphatase: 59 U/L (ref 38–126)
Anion gap: 3 — ABNORMAL LOW (ref 5–15)
BUN: 23 mg/dL (ref 8–23)
CO2: 28 mmol/L (ref 22–32)
Calcium: 9.7 mg/dL (ref 8.9–10.3)
Chloride: 100 mmol/L (ref 98–111)
Creatinine: 0.92 mg/dL (ref 0.44–1.00)
GFR, Estimated: >60 mL/min (ref >60)
Glucose, Bld: 126 mg/dL — ABNORMAL HIGH (ref 70–99)
Potassium: 3.8 mmol/L (ref 3.5–5.1)
Sodium: 131 mmol/L — ABNORMAL LOW (ref 135–145)
Total Bilirubin: 0.6 mg/dL (ref 0.3–1.2)
Total Protein: 9.5 g/dL — ABNORMAL HIGH (ref 6.5–8.1)

## 2021-10-07 LAB — IRON AND IRON BINDING CAPACITY (CC-WL,HP ONLY)
Iron: 88 ug/dL (ref 28–170)
Saturation Ratios: 31 % (ref 10.4–31.8)
TIBC: 287 ug/dL (ref 250–450)
UIBC: 199 ug/dL (ref 148–442)

## 2021-10-07 LAB — CBC WITH DIFFERENTIAL (CANCER CENTER ONLY)
Abs Immature Granulocytes: 0 10*3/uL (ref 0.00–0.07)
Basophils Absolute: 0 10*3/uL (ref 0.0–0.1)
Basophils Relative: 0 %
Eosinophils Absolute: 0 10*3/uL (ref 0.0–0.5)
Eosinophils Relative: 1 %
HCT: 30.9 % — ABNORMAL LOW (ref 36.0–46.0)
Hemoglobin: 10.4 g/dL — ABNORMAL LOW (ref 12.0–15.0)
Immature Granulocytes: 0 %
Lymphocytes Relative: 21 %
Lymphs Abs: 0.7 10*3/uL (ref 0.7–4.0)
MCH: 32.3 pg (ref 26.0–34.0)
MCHC: 33.7 g/dL (ref 30.0–36.0)
MCV: 96 fL (ref 80.0–100.0)
Monocytes Absolute: 0.4 10*3/uL (ref 0.1–1.0)
Monocytes Relative: 12 %
Neutro Abs: 2.2 10*3/uL (ref 1.7–7.7)
Neutrophils Relative %: 66 %
Platelet Count: 232 10*3/uL (ref 150–400)
RBC: 3.22 MIL/uL — ABNORMAL LOW (ref 3.87–5.11)
RDW: 15.3 % (ref 11.5–15.5)
WBC Count: 3.4 10*3/uL — ABNORMAL LOW (ref 4.0–10.5)
nRBC: 0 % (ref 0.0–0.2)

## 2021-10-07 LAB — FERRITIN: Ferritin: 266 ng/mL (ref 11–307)

## 2021-10-07 NOTE — Assessment & Plan Note (Signed)
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 pylori: Treated with antibiotics for 10 days.  I suspect this is the cause of the low white blood cell count.  Current treatment: Retacrit 40,000 units every 3 weeks started 08/05/2021

## 2021-11-01 ENCOUNTER — Ambulatory Visit: Payer: Self-pay | Admitting: Allergy

## 2021-11-04 ENCOUNTER — Ambulatory Visit: Payer: Medicare PPO | Admitting: Pulmonary Disease

## 2021-11-05 ENCOUNTER — Inpatient Hospital Stay: Payer: Medicare PPO

## 2021-11-05 ENCOUNTER — Inpatient Hospital Stay: Payer: Medicare PPO | Attending: Hematology and Oncology

## 2021-11-05 ENCOUNTER — Other Ambulatory Visit: Payer: Self-pay

## 2021-11-05 VITALS — BP 156/94 | HR 83 | Temp 98.7°F | Resp 18

## 2021-11-05 DIAGNOSIS — D6800 Von Willebrand disease, unspecified: Secondary | ICD-10-CM | POA: Diagnosis not present

## 2021-11-05 DIAGNOSIS — D649 Anemia, unspecified: Secondary | ICD-10-CM

## 2021-11-05 DIAGNOSIS — B999 Unspecified infectious disease: Secondary | ICD-10-CM

## 2021-11-05 DIAGNOSIS — Z79899 Other long term (current) drug therapy: Secondary | ICD-10-CM | POA: Insufficient documentation

## 2021-11-05 LAB — CBC WITH DIFFERENTIAL (CANCER CENTER ONLY)
Abs Immature Granulocytes: 0 10*3/uL (ref 0.00–0.07)
Basophils Absolute: 0 10*3/uL (ref 0.0–0.1)
Basophils Relative: 0 %
Eosinophils Absolute: 0 10*3/uL (ref 0.0–0.5)
Eosinophils Relative: 1 %
HCT: 28.7 % — ABNORMAL LOW (ref 36.0–46.0)
Hemoglobin: 9.7 g/dL — ABNORMAL LOW (ref 12.0–15.0)
Immature Granulocytes: 0 %
Lymphocytes Relative: 30 %
Lymphs Abs: 0.8 10*3/uL (ref 0.7–4.0)
MCH: 31.7 pg (ref 26.0–34.0)
MCHC: 33.8 g/dL (ref 30.0–36.0)
MCV: 93.8 fL (ref 80.0–100.0)
Monocytes Absolute: 0.4 10*3/uL (ref 0.1–1.0)
Monocytes Relative: 13 %
Neutro Abs: 1.6 10*3/uL — ABNORMAL LOW (ref 1.7–7.7)
Neutrophils Relative %: 56 %
Platelet Count: 217 10*3/uL (ref 150–400)
RBC: 3.06 MIL/uL — ABNORMAL LOW (ref 3.87–5.11)
RDW: 14.8 % (ref 11.5–15.5)
WBC Count: 2.9 10*3/uL — ABNORMAL LOW (ref 4.0–10.5)
nRBC: 0 % (ref 0.0–0.2)

## 2021-11-05 LAB — CMP (CANCER CENTER ONLY)
ALT: 14 U/L (ref 0–44)
AST: 21 U/L (ref 15–41)
Albumin: 3.8 g/dL (ref 3.5–5.0)
Alkaline Phosphatase: 58 U/L (ref 38–126)
Anion gap: 3 — ABNORMAL LOW (ref 5–15)
BUN: 20 mg/dL (ref 8–23)
CO2: 28 mmol/L (ref 22–32)
Calcium: 9.3 mg/dL (ref 8.9–10.3)
Chloride: 102 mmol/L (ref 98–111)
Creatinine: 0.91 mg/dL (ref 0.44–1.00)
GFR, Estimated: >60 mL/min (ref >60)
Glucose, Bld: 110 mg/dL — ABNORMAL HIGH (ref 70–99)
Potassium: 3.9 mmol/L (ref 3.5–5.1)
Sodium: 133 mmol/L — ABNORMAL LOW (ref 135–145)
Total Bilirubin: 0.5 mg/dL (ref 0.3–1.2)
Total Protein: 9.4 g/dL — ABNORMAL HIGH (ref 6.5–8.1)

## 2021-11-05 LAB — IRON AND IRON BINDING CAPACITY (CC-WL,HP ONLY)
Iron: 83 ug/dL (ref 28–170)
Saturation Ratios: 29 % (ref 10.4–31.8)
TIBC: 291 ug/dL (ref 250–450)
UIBC: 208 ug/dL (ref 148–442)

## 2021-11-05 LAB — FERRITIN: Ferritin: 259 ng/mL (ref 11–307)

## 2021-11-05 MED ORDER — EPOETIN ALFA 40000 UNIT/ML IJ SOLN
40000.0000 [IU] | Freq: Once | INTRAMUSCULAR | Status: AC
  Administered 2021-11-05: 40000 [IU] via SUBCUTANEOUS
  Filled 2021-11-05: qty 1

## 2021-11-08 DIAGNOSIS — E1122 Type 2 diabetes mellitus with diabetic chronic kidney disease: Secondary | ICD-10-CM | POA: Diagnosis not present

## 2021-11-11 DIAGNOSIS — H25813 Combined forms of age-related cataract, bilateral: Secondary | ICD-10-CM | POA: Diagnosis not present

## 2021-11-11 DIAGNOSIS — Z79899 Other long term (current) drug therapy: Secondary | ICD-10-CM | POA: Diagnosis not present

## 2021-11-11 DIAGNOSIS — E119 Type 2 diabetes mellitus without complications: Secondary | ICD-10-CM | POA: Diagnosis not present

## 2021-11-11 DIAGNOSIS — H524 Presbyopia: Secondary | ICD-10-CM | POA: Diagnosis not present

## 2021-11-13 ENCOUNTER — Ambulatory Visit (INDEPENDENT_AMBULATORY_CARE_PROVIDER_SITE_OTHER): Payer: Medicare PPO

## 2021-11-13 ENCOUNTER — Ambulatory Visit (INDEPENDENT_AMBULATORY_CARE_PROVIDER_SITE_OTHER): Payer: Medicare PPO | Admitting: Pulmonary Disease

## 2021-11-13 ENCOUNTER — Encounter: Payer: Self-pay | Admitting: Pulmonary Disease

## 2021-11-13 ENCOUNTER — Ambulatory Visit: Payer: Medicare PPO | Admitting: Pulmonary Disease

## 2021-11-13 VITALS — BP 106/68 | HR 97 | Temp 97.6°F | Ht 64.0 in | Wt 163.0 lb

## 2021-11-13 DIAGNOSIS — R0602 Shortness of breath: Secondary | ICD-10-CM

## 2021-11-13 DIAGNOSIS — J849 Interstitial pulmonary disease, unspecified: Secondary | ICD-10-CM

## 2021-11-13 LAB — PULMONARY FUNCTION TEST
DL/VA % pred: 70 %
DL/VA: 2.89 ml/min/mmHg/L
DLCO cor % pred: 61 %
DLCO cor: 11.95 ml/min/mmHg
DLCO unc % pred: 53 %
DLCO unc: 10.33 ml/min/mmHg
FEF 25-75 Post: 2.33 L/sec
FEF 25-75 Pre: 3.67 L/sec
FEF2575-%Change-Post: -36 %
FEF2575-%Pred-Post: 125 %
FEF2575-%Pred-Pre: 198 %
FEV1-%Change-Post: -4 %
FEV1-%Pred-Post: 105 %
FEV1-%Pred-Pre: 110 %
FEV1-Post: 2.37 L
FEV1-Pre: 2.48 L
FEV1FVC-%Change-Post: -3 %
FEV1FVC-%Pred-Pre: 116 %
FEV6-%Change-Post: 0 %
FEV6-%Pred-Post: 98 %
FEV6-%Pred-Pre: 98 %
FEV6-Post: 2.78 L
FEV6-Pre: 2.79 L
FEV6FVC-%Change-Post: 0 %
FEV6FVC-%Pred-Post: 104 %
FEV6FVC-%Pred-Pre: 104 %
FVC-%Change-Post: -1 %
FVC-%Pred-Post: 94 %
FVC-%Pred-Pre: 95 %
FVC-Post: 2.79 L
FVC-Pre: 2.82 L
Post FEV1/FVC ratio: 85 %
Post FEV6/FVC ratio: 100 %
Pre FEV1/FVC ratio: 88 %
Pre FEV6/FVC Ratio: 100 %
RV % pred: 68 %
RV: 1.51 L
TLC % pred: 86 %
TLC: 4.39 L

## 2021-11-13 NOTE — Patient Instructions (Signed)
Full PFT Performed Today  

## 2021-11-13 NOTE — Patient Instructions (Signed)
Glad you are stable with your breathing We will reorder the high-resolution CT Get a chest x-ray today Return to clinic in 3 months.

## 2021-11-13 NOTE — Progress Notes (Signed)
Full PFT Performed Today  

## 2021-11-13 NOTE — Progress Notes (Signed)
       Brandy Medina    [id number]    04/10/1950  Primary Care Physician:Husain, Karrar, MD  Referring Physician: Husain, Karrar, MD [address] [place],  [place] [address]  Chief complaint: Follow up for interstitial lung disease, Sjogren's syndrome  HPI: 71 y.o. patient with history of von Willebrand's disease, Sjogren's syndrome, maintained on Plaquenil which is stopped due to darkening skin.  Started on Imuran by Dr. Beekman, rheumatology Diagnosed with streptococcal pneumonia in February 2022 which is treated with antibiotics CT scan shows progressive UIP fibrosis and she has been referred to pulmonary clinic for further evaluation  Complains of minimal cough, chronic dyspnea on exertion which is progressive in nature Has dry mouth, dry eyes  Azathioprine was held in February 2022 due to pancytopenia, elevated LFTs.  She is undergoing work-up with Dr. Gudena for this.  She does have formal diagnosis of von Willebrand's disease but on repeat testing does not have any evidence of this.  She may need a bone marrow biopsy  She started Esbriet on 04/26/2020.  It was initially well-tolerated but last week she developed side effects of dizziness, lightheadedness, headache, loss of appetite.  She has held the Esbriet and is now feeling better.  Pets: No pets Occupation: Retired schoolteacher Exposures: No mold, hot tub, Jacuzzi.  She has done cushions in the living room couch but exposures is minimal Smoking history: 9  pack-year smoker.  Quit in 1995 Travel history: Originally from [place].  No significant recent travel Relevant family history: No family history of lung disease  Interim history: Continues of Esbriet.  She is also not taking azathioprine and Plaquenil for her Sjogren's Continues follow-up with Dr. Gudena for anemia and is getting Aranesp injections.  Complains of increasing cough with mucus production  Outpatient Encounter Medications as of  11/13/2021  Medication Sig   acetaminophen (TYLENOL) 500 MG tablet Take 500-1,000 mg by mouth every 6 (six) hours as needed for moderate pain or headache.   amLODipine (NORVASC) 5 MG tablet Take 5 mg by mouth 2 (two) times daily.   butalbital-acetaminophen-caffeine (FIORICET) 50-325-40 MG tablet Take 1 tablet by mouth 2 (two) times daily as needed for migraine.   chlorpheniramine (CHLOR-TRIMETON) 4 MG tablet Take 4 mg by mouth every 4 (four) hours as needed for allergies.   Cholecalciferol (VITAMIN D) 50 MCG (2000 UT) tablet Take 2,000 Units by mouth 2 (two) times a week. Tues and Thurs   dextromethorphan (DELSYM) 30 MG/5ML liquid Take 30 mg by mouth 2 (two) times daily as needed for cough.   epoetin alfa (EPOGEN) 10000 UNIT/ML injection as directed Injection once every 3 weeks for 3 months   Homeopathic Products (ARNICARE PAIN RELIEF EX) Apply 1 application. topically daily as needed (pain).   loratadine (CLARITIN) 10 MG tablet Take 10 mg by mouth daily as needed for allergies.   Polyvinyl Alcohol-Povidone (REFRESH OP) Place 1 drop into both eyes daily as needed (dry eyes).   Respiratory Therapy Supplies (FLUTTER) DEVI Use as directed.   rosuvastatin (CRESTOR) 5 MG tablet Take 5 mg by mouth every Tuesday.   valsartan (DIOVAN) 320 MG tablet Take 320 mg by mouth daily.   Wheat Dextrin (BENEFIBER PO) Take 1 Scoop by mouth daily.   Facility-Administered Encounter Medications as of 11/13/2021  Medication   desmopressin (DDAVP) 20.4 mcg in sodium chloride 0.9 % 50 mL IVPB   desmopressin (DDAVP) 20.4 mcg in sodium chloride 0.9 % 50 mL IVPB  ,   Physical Exam: Blood pressure (!) 140/84, pulse 81, temperature 98.1 F (36.7 C), temperature source Oral, height 5' 4" (1.626 m), weight 163 lb 6.4 oz (74.1 kg), SpO2 98 %. 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  Worsened since previous scan in 2010, groundglass opacities in the peripheral right upper lobe.  I have reviewed the images personally.  PFTs: 04/05/2020 FVC 2.66 [108%], FEV1 2.18 [114%], DLCO 8.23 [41%]  11/13/2021 FVC 2.79 [94%], FEV1 2.37 [105%], F/F 85, TLC 4.39 [86%], DLCO 10.33 [53%] Moderate diffusion defect  Labs: CMP 05/14/2020- AST 18, ALT 9  Assessment:  UIP fibrosis Sjogren's syndrome She has progressive fibrosis as noted on the CT scan and worsening PFTs compared to 2016 in 2021  She has not tolerated Esbriet.  I am not sure if the side effects were due to the Esbriet medication or infection as she had a temperature of 103 around the same time. Nonetheless she wants to avoid a retrial at this time. She is also currently off Imuran and Plaquenil which was started by rheumatology.  We discussed retrial of antifibrotic therapy with Ofev or Esbriet but she is very reluctant to try them.  We will get a follow-up high-res CT.  Get a chest x-ray today to evaluate cough with mucus production  Sinus congestion, postnasal drip Requesting a referral to allergy.  We made a referral at last visit but she had to cancel.  She will call back to reschedule She has not tolerated steroid nasal sprays in the past due to epistaxis Flutter valve for mucociliary clearance  Plan/Recommendations: Hold off on antifibrotic therapy per patient preference Chest x-ray today Follow-up CT Referral to allergy Flutter valve Reevaluate in 3 months  Marshell Garfinkel MD [facility] 11/13/2021, 2:34 PM  CC: Wenda Low, MD

## 2021-12-13 ENCOUNTER — Ambulatory Visit
Admission: RE | Admit: 2021-12-13 | Discharge: 2021-12-13 | Disposition: A | Discharge status: Home / Self Care | Payer: Medicare PPO | Source: Ambulatory Visit | Attending: Pulmonary Disease | Admitting: Pulmonary Disease

## 2021-12-13 DIAGNOSIS — J849 Interstitial pulmonary disease, unspecified: Secondary | ICD-10-CM

## 2021-12-13 DIAGNOSIS — J84112 Idiopathic pulmonary fibrosis: Secondary | ICD-10-CM | POA: Diagnosis not present

## 2021-12-13 DIAGNOSIS — I700 Atherosclerosis of aorta: Secondary | ICD-10-CM | POA: Diagnosis not present

## 2021-12-13 DIAGNOSIS — R918 Other nonspecific abnormal finding of lung field: Secondary | ICD-10-CM | POA: Diagnosis not present

## 2021-12-13 DIAGNOSIS — J479 Bronchiectasis, uncomplicated: Secondary | ICD-10-CM | POA: Diagnosis not present

## 2021-12-17 ENCOUNTER — Inpatient Hospital Stay: Payer: Medicare PPO

## 2021-12-18 ENCOUNTER — Other Ambulatory Visit: Payer: Medicare PPO

## 2021-12-24 ENCOUNTER — Inpatient Hospital Stay: Payer: Medicare PPO | Attending: Hematology and Oncology

## 2021-12-24 ENCOUNTER — Inpatient Hospital Stay: Payer: Medicare PPO

## 2021-12-24 ENCOUNTER — Other Ambulatory Visit: Payer: Self-pay

## 2021-12-24 DIAGNOSIS — D638 Anemia in other chronic diseases classified elsewhere: Secondary | ICD-10-CM

## 2021-12-24 DIAGNOSIS — D649 Anemia, unspecified: Secondary | ICD-10-CM

## 2021-12-24 DIAGNOSIS — D509 Iron deficiency anemia, unspecified: Secondary | ICD-10-CM | POA: Insufficient documentation

## 2021-12-24 DIAGNOSIS — D6800 Von Willebrand disease, unspecified: Secondary | ICD-10-CM | POA: Diagnosis not present

## 2021-12-24 LAB — CBC WITH DIFFERENTIAL (CANCER CENTER ONLY)
Abs Immature Granulocytes: 0.01 10*3/uL (ref 0.00–0.07)
Basophils Absolute: 0 10*3/uL (ref 0.0–0.1)
Basophils Relative: 0 %
Eosinophils Absolute: 0 10*3/uL (ref 0.0–0.5)
Eosinophils Relative: 1 %
HCT: 30.8 % — ABNORMAL LOW (ref 36.0–46.0)
Hemoglobin: 10.1 g/dL — ABNORMAL LOW (ref 12.0–15.0)
Immature Granulocytes: 0 %
Lymphocytes Relative: 27 %
Lymphs Abs: 0.9 10*3/uL (ref 0.7–4.0)
MCH: 31.3 pg (ref 26.0–34.0)
MCHC: 32.8 g/dL (ref 30.0–36.0)
MCV: 95.4 fL (ref 80.0–100.0)
Monocytes Absolute: 0.5 10*3/uL (ref 0.1–1.0)
Monocytes Relative: 13 %
Neutro Abs: 2 10*3/uL (ref 1.7–7.7)
Neutrophils Relative %: 59 %
Platelet Count: 252 10*3/uL (ref 150–400)
RBC: 3.23 MIL/uL — ABNORMAL LOW (ref 3.87–5.11)
RDW: 14.9 % (ref 11.5–15.5)
WBC Count: 3.4 10*3/uL — ABNORMAL LOW (ref 4.0–10.5)
nRBC: 0 % (ref 0.0–0.2)

## 2021-12-24 LAB — CMP (CANCER CENTER ONLY)
ALT: 16 U/L (ref 0–44)
AST: 22 U/L (ref 15–41)
Albumin: 4 g/dL (ref 3.5–5.0)
Alkaline Phosphatase: 66 U/L (ref 38–126)
Anion gap: 6 (ref 5–15)
BUN: 24 mg/dL — ABNORMAL HIGH (ref 8–23)
CO2: 26 mmol/L (ref 22–32)
Calcium: 9.7 mg/dL (ref 8.9–10.3)
Chloride: 102 mmol/L (ref 98–111)
Creatinine: 0.97 mg/dL (ref 0.44–1.00)
GFR, Estimated: >60 mL/min (ref >60)
Glucose, Bld: 102 mg/dL — ABNORMAL HIGH (ref 70–99)
Potassium: 4.4 mmol/L (ref 3.5–5.1)
Sodium: 134 mmol/L — ABNORMAL LOW (ref 135–145)
Total Bilirubin: 0.4 mg/dL (ref 0.3–1.2)
Total Protein: 9.9 g/dL — ABNORMAL HIGH (ref 6.5–8.1)

## 2021-12-24 LAB — FERRITIN: Ferritin: 323 ng/mL — ABNORMAL HIGH (ref 11–307)

## 2021-12-24 LAB — IRON AND IRON BINDING CAPACITY (CC-WL,HP ONLY)
Iron: 78 ug/dL (ref 28–170)
Saturation Ratios: 26 % (ref 10.4–31.8)
TIBC: 295 ug/dL (ref 250–450)
UIBC: 217 ug/dL (ref 148–442)

## 2021-12-24 NOTE — Progress Notes (Signed)
Patient is here for Epogen injection. Her Hgb is 10.1 today. Per Dr. Lindi Adie, patient doesn't have to receive injection today. AVS and labs printed.

## 2022-01-07 NOTE — Progress Notes (Unsigned)
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 female with a hx of DM II, primary hypertension, Sjogren's,  ILD, anemia, migraines, recent onset chest pain which radiates around the bottom of her rib cage on the left, and coronary calcium score of 1.   There are no cardiac symptoms.  She denies orthopnea and PND.  No medication side effects.  She has mild atherosclerosis, asymptomatic for which statin therapy and low-dose aspirin are considerations.  She needs to clear aspirin therapy with her other physicians as it is not an immediate action item.  She wants to continue cardiology follow-up.  She prefers a female.  I have recommended Dr. Godfrey Pick Tobb.  Past Medical History:  Diagnosis Date   Allergic rhinitis    Diabetes (Brandy Medina)    HTN (hypertension)    Migraines    PONV (postoperative nausea and vomiting)    Pulmonary fibrosis ([place])     Past Surgical History:  Procedure Laterality Date   ABDOMINAL HYSTERECTOMY     BIOPSY  06/01/2020   Procedure: BIOPSY;  Surgeon: [name], MD;  Location: [facility];  Service: Gastroenterology;;  EGD and COLON   BIOPSY  06/04/2021   Procedure: BIOPSY;  Surgeon: [name], MD;  Location: [facility];  Service: Gastroenterology;;   BREAST SURGERY     COLONOSCOPY WITH PROPOFOL N/A 06/01/2020   Procedure: COLONOSCOPY WITH PROPOFOL;  Surgeon: [name], MD;  Location: [facility];  Service: Gastroenterology;  Laterality: N/A;   ESOPHAGOGASTRODUODENOSCOPY N/A 06/04/2021   Procedure: ESOPHAGOGASTRODUODENOSCOPY (EGD);  Surgeon: [name], MD;  Location: [facility];  Service: Gastroenterology;   Laterality: N/A;   ESOPHAGOGASTRODUODENOSCOPY (EGD) WITH PROPOFOL N/A 06/01/2020   Procedure: ESOPHAGOGASTRODUODENOSCOPY (EGD) WITH PROPOFOL;  Surgeon: [name], MD;  Location: [facility];  Service: Gastroenterology;  Laterality: N/A;   POLYPECTOMY  06/01/2020   Procedure: POLYPECTOMY;  Surgeon: [name], MD;  Location: [facility];  Service: Gastroenterology;;    Current Medications: Current Meds  Medication Sig   acetaminophen (TYLENOL) 500 MG tablet Take 500-1,000 mg by mouth every 6 (six) hours as needed for moderate pain or headache.   amLODipine (NORVASC) 5 MG tablet Take 5 mg by mouth 2 (two) times daily.   butalbital-acetaminophen-caffeine (FIORICET) 50-325-40 MG tablet Take 1 tablet by mouth 2 (two) times daily as needed for migraine.   chlorpheniramine (CHLOR-TRIMETON) 4 MG tablet Take 4 mg by mouth every 4 (four) hours as needed for allergies.   Cholecalciferol (VITAMIN D) 50 MCG (2000 UT) tablet Take 2,000 Units by mouth 2 (two) times a week. Tues and Thurs   dextromethorphan (DELSYM) 30 MG/5ML liquid Take 30 mg by mouth 2 (two) times daily as needed for cough.   epoetin alfa (EPOGEN) 10000 UNIT/ML injection as directed Injection once every 3 weeks for 3 months   Homeopathic Products (ARNICARE PAIN RELIEF EX) Apply 1 application. topically daily as needed (pain).   loratadine (CLARITIN) 10 MG tablet Take 10 mg by mouth daily as needed for allergies.   Polyvinyl Alcohol-Povidone (REFRESH OP) Place 1 drop into both eyes daily as needed (dry  eyes).   Respiratory Therapy Supplies (FLUTTER) DEVI Use as directed.   rosuvastatin (CRESTOR) 5 MG tablet Take 5 mg by mouth every Tuesday.   valsartan (DIOVAN) 320 MG tablet Take 320 mg by mouth daily.   Wheat Dextrin (BENEFIBER PO) Take 1 Scoop by mouth daily.   Current Facility-Administered Medications for the 01/08/22 encounter (Office Visit) with Belva Crome, MD  Medication   desmopressin (DDAVP) 20.4 mcg in sodium chloride 0.9 %  50 mL IVPB   desmopressin (DDAVP) 20.4 mcg in sodium chloride 0.9 % 50 mL IVPB     Allergies:   Avelox [moxifloxacin hcl in nacl], Avelox [moxifloxacin], Azathioprine, Codeine, Erythromycin, Fexofenadine, Ibuprofen, Levocetirizine, Lisinopril, Sulfa antibiotics, Sulfonamide derivatives, and Zyrtec [cetirizine]   Social History   Socioeconomic History   Marital status: Married    Spouse name: Not on file   Number of children: Not on file   Years of education: Not on file   Highest education level: Not on file  Occupational History   Not on file  Tobacco Use   Smoking status: Former    Packs/day: 0.50    Years: 18.00    Total pack years: 9.00    Types: Cigarettes    Quit date: 07/05/1993    Years since quitting: 28.5   Smokeless tobacco: Never  Substance and Sexual Activity   Alcohol use: Not on file   Drug use: Not on file   Sexual activity: Not on file  Other Topics Concern   Not on file  Social History Narrative   Not on file   Social Determinants of Health   Financial Resource Strain: Not on file  Food Insecurity: Not on file  Transportation Needs: Not on file  Physical Activity: Not on file  Stress: Not on file  Social Connections: Not on file     Family History: The patient's family history includes Bronchitis in her mother; Diabetes in her sister and sister; Heart attack in her brother and father; Heart disease in her father and mother; Hypertension in her sister and sister; Renal Disease in her brother; Stroke in her brother.  ROS:   Please see the history of present illness.    No new data or CV complaints.  Transient ankle edema that disappears by the next morning.  All other systems reviewed and are negative.  EKGs/Labs/Other Studies Reviewed:    The following studies were reviewed today: Coronary Calcium Score 05/10/2021: IMPRESSION: Coronary calcium score of 1. This was 53rd percentile for age-, race-, and sex-matched controls.  ECHOCARDIOGRAM  20222: IMPRESSIONS   1. Left ventricular ejection fraction, by estimation, is 65 to 70%. Left  ventricular ejection fraction by 3D volume is 72 %. The left ventricle has  normal function. The left ventricle has no regional wall motion  abnormalities. There is mild asymmetric  left ventricular hypertrophy of the basal-septal segment. Left ventricular  diastolic parameters are consistent with Grade I diastolic dysfunction  (impaired relaxation).   2. Right ventricular systolic function is normal. The right ventricular  size is normal. There is normal pulmonary artery systolic pressure. The  estimated right ventricular systolic pressure is 89.2 mmHg.   3. Left atrial size was mildly dilated.   4. The mitral valve is normal in structure. Trivial mitral valve  regurgitation.   5. The aortic valve is tricuspid. There is mild thickening of the aortic  valve. Aortic valve regurgitation is not visualized. No aortic stenosis is  present.   6. The inferior vena cava  is normal in size with greater than 50%  respiratory variability, suggesting right atrial pressure of 3 mmHg.   Comparison(s): Compared to prior echo in 2017, there is no significant  change.    EKG:  EKG normal sinus rhythm, right bundle branch block, left anterior hemiblock, with no notable acute changes.  Recent Labs: 12/24/2021: ALT 16; BUN 24; Creatinine 0.97; Hemoglobin 10.1; Platelet Count 252; Potassium 4.4; Sodium 134  Recent Lipid Panel No results found for: "CHOL", "TRIG", "HDL", "CHOLHDL", "VLDL", "LDLCALC", "LDLDIRECT"  Physical Exam:    VS:  BP 128/88   Pulse 81   Ht '5\' 4"'$  (1.626 m)   Wt 161 lb 3.2 oz (73.1 kg)   SpO2 97%   BMI 27.67 kg/m     Wt Readings from Last 3 Encounters:  01/08/22 161 lb 3.2 oz (73.1 kg)  11/13/21 163 lb (73.9 kg)  10/07/21 163 lb 3.2 oz (74 kg)     GEN: Healthy appearing. No acute distress HEENT: Normal NECK: No JVD. LYMPHATICS: No lymphadenopathy CARDIAC: No murmur. RRR no  gallop, or edema. VASCULAR:  Normal Pulses. No bruits. RESPIRATORY: Faint basilar cellophane like crackles right greater than left.  Otherwise clear to auscultation without rales, wheezing or rhonchi  ABDOMEN: Soft, non-tender, non-distended, No pulsatile mass, MUSCULOSKELETAL: No deformity  SKIN: Warm and dry NEUROLOGIC:  Alert and oriented x 3 PSYCHIATRIC:  Normal affect   ASSESSMENT:    1. Primary hypertension with LVH   2. RBBB   3. Agatston coronary artery calcium score less than 100   4. Controlled type 2 diabetes mellitus with other circulatory complication, without long-term current use of insulin ([place])   5. ILD (interstitial lung disease) ([place])   6. Hypertensive left ventricular hypertrophy, without heart failure   7. Chest pain of uncertain etiology    PLAN:    In order of problems listed above:  Continue amlodipine and Diovan.  Consider adding low-dose diuretic therapy to keep blood pressure near target of 130/80 mmHg.  Diet and nonsteroidal anti-inflammatory agents were discussed relative to impact on blood pressure. Bifascicular block.  She has no functional abnormality.  She understands that rarely bifascicular block can lead to higher grades of conduction abnormality that eventuating pacemaker therapy. Continue statin therapy.  Consider and recommend 81 mg coated aspirin 3 days a week. Continue aggressive management Blood pressure control less than 140/90 mmHg. Noted.  Adequate control.  No symptoms of dyspnea to suggest diastolic heart failure.  Continue to monitor.  SGLT2 therapy could be helpful if needed. In my opinion, chest pain is noncardiac, possibly inflammatory or related to thoracic intercostal nerve root irritation with referred pain.   What will be with Dr. Godfrey Pick Tobb in 9 to 12 months.   Medication Adjustments/Labs and Tests Ordered: Current medicines are reviewed at length with the patient today.  Concerns regarding medicines are outlined above.   Orders Placed This Encounter  Procedures   EKG 12-Lead   No orders of the defined types were placed in this encounter.   Patient Instructions  Medication Instructions:  Your physician recommends that you continue on your current medications as directed. Please refer to the Current Medication list given to you today.  *If you need a refill on your cardiac medications before your next appointment, please call your pharmacy*  Follow-Up: At [facility], you and your health needs are our priority.  As part of our continuing mission to provide you with exceptional heart care, we have created designated Provider  Care Teams.  These Care Teams include your primary Cardiologist (physician) and Advanced Practice Providers (APPs -  Physician Assistants and Nurse Practitioners) who all work together to provide you with the care you need, when you need it.  Your next appointment:   9-12 month(s)  The format for your next appointment:   In Person  Provider:   Berniece Salines, MD  Important Information About Sugar         Signed, Sinclair Grooms, MD  01/08/2022 9:06 AM    [place]

## 2022-01-08 ENCOUNTER — Encounter: Payer: Self-pay | Admitting: Interventional Cardiology

## 2022-01-08 ENCOUNTER — Ambulatory Visit: Payer: Medicare PPO | Attending: Interventional Cardiology | Admitting: Interventional Cardiology

## 2022-01-08 ENCOUNTER — Ambulatory Visit: Payer: Medicare PPO | Admitting: Interventional Cardiology

## 2022-01-08 VITALS — BP 128/88 | HR 81 | Ht 64.0 in | Wt 161.2 lb

## 2022-01-08 DIAGNOSIS — I10 Essential (primary) hypertension: Secondary | ICD-10-CM | POA: Diagnosis not present

## 2022-01-08 DIAGNOSIS — I119 Hypertensive heart disease without heart failure: Secondary | ICD-10-CM | POA: Diagnosis not present

## 2022-01-08 DIAGNOSIS — E1159 Type 2 diabetes mellitus with other circulatory complications: Secondary | ICD-10-CM

## 2022-01-08 DIAGNOSIS — R079 Chest pain, unspecified: Secondary | ICD-10-CM | POA: Diagnosis not present

## 2022-01-08 DIAGNOSIS — I4510 Unspecified right bundle-branch block: Secondary | ICD-10-CM

## 2022-01-08 DIAGNOSIS — R931 Abnormal findings on diagnostic imaging of heart and coronary circulation: Secondary | ICD-10-CM | POA: Diagnosis not present

## 2022-01-08 DIAGNOSIS — J849 Interstitial pulmonary disease, unspecified: Secondary | ICD-10-CM

## 2022-01-08 NOTE — Patient Instructions (Signed)
Medication Instructions:  Your physician recommends that you continue on your current medications as directed. Please refer to the Current Medication list given to you today.  *If you need a refill on your cardiac medications before your next appointment, please call your pharmacy*  Follow-Up: At [facility], you and your health needs are our priority.  As part of our continuing mission to provide you with exceptional heart care, we have created designated Provider Care Teams.  These Care Teams include your primary Cardiologist (physician) and Advanced Practice Providers (APPs -  Physician Assistants and Nurse Practitioners) who all work together to provide you with the care you need, when you need it.  Your next appointment:   9-12 month(s)  The format for your next appointment:   In Person  Provider:   Berniece Salines, MD  Important Information About Sugar

## 2022-01-10 ENCOUNTER — Ambulatory Visit: Payer: Self-pay | Admitting: Internal Medicine

## 2022-01-15 ENCOUNTER — Ambulatory Visit: Payer: Self-pay | Admitting: Allergy

## 2022-01-21 ENCOUNTER — Telehealth: Payer: Self-pay | Admitting: Nurse Practitioner

## 2022-01-21 NOTE — Telephone Encounter (Signed)
Marshell Garfinkel, MD 01/17/2022  1:12 PM EST     CT shows that her lung scarring is slightly worse.  Please make a follow-up visit with me to discuss therapy options.  Looks like she already has an appointment with Roxan Diesel.  Can you switch that to me?     Attempted to call pt to go over recent CT results but unable to reach. Left message for her to return call.

## 2022-01-24 ENCOUNTER — Encounter: Payer: Self-pay | Admitting: Hematology and Oncology

## 2022-01-28 NOTE — Progress Notes (Signed)
Patient Care Team: Wenda Low, MD as PCP - General (Internal Medicine)  DIAGNOSIS:  Encounter Diagnosis  Name Primary?   Normocytic anemia Yes     CHIEF COMPLIANT: Follow-up of anemia of chronic disease/normocytic anemia    INTERVAL HISTORY: Brandy Medina is a 72 y.o. female with above-mentioned history of iron deficiency anemia and Von Willebrand's disease. She presents to the clinic for labs and follow-up.  She reports that she gets a headache every time she gets Retacrit injection for several days.  In November when she did not get the injection she felt significantly better from the headache standpoint.  She has not had any noticeable change in how she feels with or without the Retacrit injections.  With her Sjogren's syndrome she was on azathioprine which we discontinued and since then it appears that the hemoglobin has improved and the white count also significantly improved.   ALLERGIES:  is allergic to avelox [moxifloxacin hcl in nacl], avelox [moxifloxacin], azathioprine, codeine, erythromycin, fexofenadine, ibuprofen, levocetirizine, lisinopril, sulfa antibiotics, sulfonamide derivatives, and zyrtec [cetirizine].  MEDICATIONS:  Current Outpatient Medications  Medication Sig Dispense Refill   acetaminophen (TYLENOL) 500 MG tablet Take 500-1,000 mg by mouth every 6 (six) hours as needed for moderate pain or headache.     amLODipine (NORVASC) 5 MG tablet Take 5 mg by mouth 2 (two) times daily.     butalbital-acetaminophen-caffeine (FIORICET) 50-325-40 MG tablet Take 1 tablet by mouth 2 (two) times daily as needed for migraine.     chlorpheniramine (CHLOR-TRIMETON) 4 MG tablet Take 4 mg by mouth every 4 (four) hours as needed for allergies.     Cholecalciferol (VITAMIN D) 50 MCG (2000 UT) tablet Take 2,000 Units by mouth 2 (two) times a week. Tues and Thurs     dextromethorphan (DELSYM) 30 MG/5ML liquid Take 30 mg by mouth 2 (two) times daily as needed for cough.     epoetin  alfa (EPOGEN) 10000 UNIT/ML injection as directed Injection once every 3 weeks for 3 months     Homeopathic Products (ARNICARE PAIN RELIEF EX) Apply 1 application. topically daily as needed (pain).     loratadine (CLARITIN) 10 MG tablet Take 10 mg by mouth daily as needed for allergies.     Polyvinyl Alcohol-Povidone (REFRESH OP) Place 1 drop into both eyes daily as needed (dry eyes).     Respiratory Therapy Supplies (FLUTTER) DEVI Use as directed. 1 each 0   rosuvastatin (CRESTOR) 5 MG tablet Take 5 mg by mouth every Tuesday.     valsartan (DIOVAN) 320 MG tablet Take 320 mg by mouth daily.     Wheat Dextrin (BENEFIBER PO) Take 1 Scoop by mouth daily.     Current Facility-Administered Medications  Medication Dose Route Frequency Provider Last Rate Last Admin   desmopressin (DDAVP) 20.4 mcg in sodium chloride 0.9 % 50 mL IVPB  0.3 mcg/kg Intravenous Once Salley Slaughter, PA-C       desmopressin (DDAVP) 20.4 mcg in sodium chloride 0.9 % 50 mL IVPB  0.3 mcg/kg Intravenous Once Salley Slaughter, PA-C        PHYSICAL EXAMINATION: ECOG PERFORMANCE STATUS: 1 - Symptomatic but completely ambulatory  Vitals:   02/03/22 0916  BP: (!) 140/92  Pulse: 78  Resp: 16  Temp: (!) 97.5 F (36.4 C)  SpO2: 99%   Filed Weights   02/03/22 0916  Weight: 163 lb 6.4 oz (74.1 kg)      LABORATORY DATA:  I have reviewed the data as listed  Latest Ref Rng & Units 02/03/2022    8:30 AM 12/24/2021    8:17 AM 11/05/2021   10:11 AM  CMP  Glucose 70 - 99 mg/dL 96  102  110   BUN 8 - 23 mg/dL '19  24  20   '$ Creatinine 0.44 - 1.00 mg/dL 0.89  0.97  0.91   Sodium 135 - 145 mmol/L 133  134  133   Potassium 3.5 - 5.1 mmol/L 4.1  4.4  3.9   Chloride 98 - 111 mmol/L 102  102  102   CO2 22 - 32 mmol/L '26  26  28   '$ Calcium 8.9 - 10.3 mg/dL 9.8  9.7  9.3   Total Protein 6.5 - 8.1 g/dL 9.0  9.9  9.4   Total Bilirubin 0.3 - 1.2 mg/dL 0.4  0.4  0.5   Alkaline Phos 38 - 126 U/L 63  66  58   AST 15 - 41  U/L '22  22  21   '$ ALT 0 - 44 U/L '16  16  14     '$ Lab Results  Component Value Date   WBC 3.6 (L) 02/03/2022   HGB 9.7 (L) 02/03/2022   HCT 28.7 (L) 02/03/2022   MCV 95.3 02/03/2022   PLT 231 02/03/2022   NEUTROABS 2.2 02/03/2022    ASSESSMENT & PLAN:  Normocytic anemia 04/17/2020: Hemoglobin 7.9, MCV 92.2, WBC 4.9, platelets 88, erythropoietin 38.9, ferritin 2445, iron saturation 33%, LDH 196, SPEP: Polyclonal rise in immunoglobulins, no M protein, AST 55, ALT 66, alkaline phosphatase 496, creatinine 1 07/06/2020: WBC 3.1, hemoglobin 9.4, MCV 99, platelets 254, ANC 1.7, ESR 41, CRP 1, total protein 9.3, globulins 5.2, rest of the LFTs are normal 10/07/2021: Hemoglobin 10.4   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 Pylori   Indication for bone marrow biopsy: If ANC drops below 1 and development of other cytopenias   Current treatment: Retacrit 40,000 units every 3 weeks started 08/05/2021 She complains of intermittent headaches from Retacrit.   We discussed about holding Retacrit to see if her hemoglobin stays after about 10 g. Therefore we will hold off on doing Retacrit injection today.   Leukopenia: ANC is up to 2.2.  Therefore there is no role for bone marrow biopsy at this time. I will see her back in 3 months with labs and follow-up.    No orders of the defined types were placed in this encounter.  The patient has a good understanding of the overall plan. she agrees with it. she will call with any problems that may develop before the next visit here. Total time spent: 30 mins including face to face time and time spent for planning, charting and co-ordination of care   Harriette Ohara, MD 02/03/22    I Gardiner Coins am acting as a Education administrator for [employer]  I have reviewed the above documentation for accuracy and completeness, and I agree with the above.

## 2022-01-31 DIAGNOSIS — M3500 Sicca syndrome, unspecified: Secondary | ICD-10-CM | POA: Diagnosis not present

## 2022-01-31 DIAGNOSIS — I700 Atherosclerosis of aorta: Secondary | ICD-10-CM | POA: Diagnosis not present

## 2022-01-31 DIAGNOSIS — J849 Interstitial pulmonary disease, unspecified: Secondary | ICD-10-CM | POA: Diagnosis not present

## 2022-01-31 DIAGNOSIS — E559 Vitamin D deficiency, unspecified: Secondary | ICD-10-CM | POA: Diagnosis not present

## 2022-01-31 DIAGNOSIS — Z1331 Encounter for screening for depression: Secondary | ICD-10-CM | POA: Diagnosis not present

## 2022-01-31 DIAGNOSIS — Z0000 Encounter for general adult medical examination without abnormal findings: Secondary | ICD-10-CM | POA: Diagnosis not present

## 2022-01-31 DIAGNOSIS — D649 Anemia, unspecified: Secondary | ICD-10-CM | POA: Diagnosis not present

## 2022-01-31 DIAGNOSIS — I10 Essential (primary) hypertension: Secondary | ICD-10-CM | POA: Diagnosis not present

## 2022-01-31 DIAGNOSIS — J479 Bronchiectasis, uncomplicated: Secondary | ICD-10-CM | POA: Diagnosis not present

## 2022-01-31 DIAGNOSIS — E1122 Type 2 diabetes mellitus with diabetic chronic kidney disease: Secondary | ICD-10-CM | POA: Diagnosis not present

## 2022-01-31 DIAGNOSIS — F411 Generalized anxiety disorder: Secondary | ICD-10-CM | POA: Diagnosis not present

## 2022-02-03 ENCOUNTER — Inpatient Hospital Stay: Payer: Medicare PPO

## 2022-02-03 ENCOUNTER — Other Ambulatory Visit: Payer: Self-pay

## 2022-02-03 ENCOUNTER — Inpatient Hospital Stay: Payer: Medicare PPO | Attending: Hematology and Oncology | Admitting: Hematology and Oncology

## 2022-02-03 VITALS — BP 140/92 | HR 78 | Temp 97.5°F | Resp 16 | Wt 163.4 lb

## 2022-02-03 DIAGNOSIS — D649 Anemia, unspecified: Secondary | ICD-10-CM | POA: Diagnosis not present

## 2022-02-03 DIAGNOSIS — K2970 Gastritis, unspecified, without bleeding: Secondary | ICD-10-CM | POA: Insufficient documentation

## 2022-02-03 DIAGNOSIS — B9681 Helicobacter pylori [H. pylori] as the cause of diseases classified elsewhere: Secondary | ICD-10-CM | POA: Insufficient documentation

## 2022-02-03 DIAGNOSIS — B999 Unspecified infectious disease: Secondary | ICD-10-CM

## 2022-02-03 DIAGNOSIS — M3500 Sicca syndrome, unspecified: Secondary | ICD-10-CM | POA: Insufficient documentation

## 2022-02-03 LAB — CBC WITH DIFFERENTIAL (CANCER CENTER ONLY)
Abs Immature Granulocytes: 0.01 10*3/uL (ref 0.00–0.07)
Basophils Absolute: 0 10*3/uL (ref 0.0–0.1)
Basophils Relative: 0 %
Eosinophils Absolute: 0 10*3/uL (ref 0.0–0.5)
Eosinophils Relative: 1 %
HCT: 28.7 % — ABNORMAL LOW (ref 36.0–46.0)
Hemoglobin: 9.7 g/dL — ABNORMAL LOW (ref 12.0–15.0)
Immature Granulocytes: 0 %
Lymphocytes Relative: 26 %
Lymphs Abs: 0.9 10*3/uL (ref 0.7–4.0)
MCH: 32.2 pg (ref 26.0–34.0)
MCHC: 33.8 g/dL (ref 30.0–36.0)
MCV: 95.3 fL (ref 80.0–100.0)
Monocytes Absolute: 0.5 10*3/uL (ref 0.1–1.0)
Monocytes Relative: 12 %
Neutro Abs: 2.2 10*3/uL (ref 1.7–7.7)
Neutrophils Relative %: 61 %
Platelet Count: 231 10*3/uL (ref 150–400)
RBC: 3.01 MIL/uL — ABNORMAL LOW (ref 3.87–5.11)
RDW: 16.1 % — ABNORMAL HIGH (ref 11.5–15.5)
WBC Count: 3.6 10*3/uL — ABNORMAL LOW (ref 4.0–10.5)
nRBC: 0 % (ref 0.0–0.2)

## 2022-02-03 LAB — CMP (CANCER CENTER ONLY)
ALT: 16 U/L (ref 0–44)
AST: 22 U/L (ref 15–41)
Albumin: 3.9 g/dL (ref 3.5–5.0)
Alkaline Phosphatase: 63 U/L (ref 38–126)
Anion gap: 5 (ref 5–15)
BUN: 19 mg/dL (ref 8–23)
CO2: 26 mmol/L (ref 22–32)
Calcium: 9.8 mg/dL (ref 8.9–10.3)
Chloride: 102 mmol/L (ref 98–111)
Creatinine: 0.89 mg/dL (ref 0.44–1.00)
GFR, Estimated: >60 mL/min (ref >60)
Glucose, Bld: 96 mg/dL (ref 70–99)
Potassium: 4.1 mmol/L (ref 3.5–5.1)
Sodium: 133 mmol/L — ABNORMAL LOW (ref 135–145)
Total Bilirubin: 0.4 mg/dL (ref 0.3–1.2)
Total Protein: 9 g/dL — ABNORMAL HIGH (ref 6.5–8.1)

## 2022-02-03 LAB — FERRITIN: Ferritin: 350 ng/mL — ABNORMAL HIGH (ref 11–307)

## 2022-02-03 LAB — IRON AND IRON BINDING CAPACITY (CC-WL,HP ONLY)
Iron: 101 ug/dL (ref 28–170)
Saturation Ratios: 34 % — ABNORMAL HIGH (ref 10.4–31.8)
TIBC: 294 ug/dL (ref 250–450)
UIBC: 193 ug/dL (ref 148–442)

## 2022-02-03 NOTE — Assessment & Plan Note (Signed)
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 Pylori   Indication for bone marrow biopsy: If ANC drops below 1 and development of other cytopenias   Current treatment: Retacrit 40,000 units every 3 weeks started 08/05/2021 She complains of intermittent headaches which may be unrelated to Retacrit. It looks like she will need Retacrit injection every 6 weeks. She will come back in 6 weeks to get labs and injection.   I will see her back in 3 months for follow-up.

## 2022-02-04 NOTE — Progress Notes (Unsigned)
New Patient Note  RE: Brandy Medina MRN: [id number] DOB: 1950/11/11 Date of Office Visit: 02/05/2022  Consult requested by: Marshell Garfinkel, MD Primary care provider: Wenda Low, MD  Chief Complaint: No chief complaint on file.  History of Present Illness: I had the pleasure of seeing Brandy Medina for initial evaluation at the [facility] on 02/04/2022. She is a 72 y.o. female, who is referred here by Wenda Low, MD for the evaluation of sinus congestion.  She reports symptoms of ***. Symptoms have been going on for *** years. The symptoms are present *** all year around with worsening in ***. Other triggers include exposure to ***. Anosmia: ***. Headache: ***. She has used *** with ***fair improvement in symptoms. Sinus infections: ***. Previous work up includes: ***. Previous ENT evaluation: ***. Previous sinus imaging: ***. History of nasal polyps: ***. Last eye exam: ***. History of reflux: ***.  Assessment and Plan: Brandy Medina is a 72 y.o. female with: No problem-specific Assessment & Plan notes found for this encounter.  No follow-ups on file.  No orders of the defined types were placed in this encounter.  Lab Orders  No laboratory test(s) ordered today    Other allergy screening: Asthma: {Blank single:19197::"yes","no"} Rhino conjunctivitis: {Blank single:19197::"yes","no"} Food allergy: {Blank single:19197::"yes","no"} Medication allergy: {Blank single:19197::"yes","no"} Hymenoptera allergy: {Blank single:19197::"yes","no"} Urticaria: {Blank single:19197::"yes","no"} Eczema:{Blank single:19197::"yes","no"} History of recurrent infections suggestive of immunodeficency: {Blank single:19197::"yes","no"}  Diagnostics: Spirometry:  Tracings reviewed. Her effort: {Blank single:19197::"Good reproducible efforts.","It was hard to get consistent efforts and there is a question as to whether this reflects a maximal maneuver.","Poor effort, data can not  be interpreted."} FVC: ***L FEV1: ***L, ***% predicted FEV1/FVC ratio: ***% Interpretation: {Blank single:19197::"Spirometry consistent with mild obstructive disease","Spirometry consistent with moderate obstructive disease","Spirometry consistent with severe obstructive disease","Spirometry consistent with possible restrictive disease","Spirometry consistent with mixed obstructive and restrictive disease","Spirometry uninterpretable due to technique","Spirometry consistent with normal pattern","No overt abnormalities noted given today's efforts"}.  Please see scanned spirometry results for details.  Skin Testing: {Blank single:19197::"Select foods","Environmental allergy panel","Environmental allergy panel and select foods","Food allergy panel","None","Deferred due to recent antihistamines use"}. *** Results discussed with patient/family.   Past Medical History: Patient Active Problem List   Diagnosis Date Noted  . Anemia of infection and chronic disease 07/23/2021  . Von Willebrand disease ([place]) 04/17/2020  . Normocytic anemia 04/17/2020  . ILD (interstitial lung disease) ([place]) 02/27/2020  . Sjogren's syndrome ([place]) 02/27/2020  . Abnormal findings on diagnostic imaging of lung 02/27/2020  . Abnormal ECG 12/12/2015  . 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 Essential hypertension 07/07/2014  . Upper airway cough syndrome 07/06/2014  . Obstructive bronchiectasis ([place]) 06/20/2009  . Nonspecific (abnormal) findings on radiological and other examination of body structure 08/02/2008  . COMPUTERIZED TOMOGRAPHY, CHEST, ABNORMAL 08/02/2008  . HYPOGLYCEMIA, REACTIVE 08/01/2008  . MIGRAINE HEADACHE 08/01/2008  . Sinusitis, chronic 08/01/2008  . GERD 08/01/2008  . ABDOMINAL PAIN, CHRONIC 08/01/2008  . ANEMIA, IRON DEFICIENCY, HX OF 08/01/2008  . DIVERTICULOSIS, COLON, HX OF 08/01/2008   Past Medical History:  Diagnosis Date  . Allergic rhinitis   .  Diabetes ([place])   . HTN (hypertension)   . Migraines   . PONV (postoperative nausea and vomiting)   . Pulmonary fibrosis ([place])    Past Surgical History: Past Surgical History:  Procedure Laterality Date  . ABDOMINAL HYSTERECTOMY    . BIOPSY  06/01/2020   Procedure: BIOPSY;  Surgeon: [name], MD;  Location: [facility];  Service: Gastroenterology;;  EGD and COLON  . BIOPSY  06/04/2021   Procedure: BIOPSY;  Surgeon: [name], MD;  Location: [facility];  Service: Gastroenterology;;  . BREAST SURGERY    . COLONOSCOPY WITH PROPOFOL N/A 06/01/2020   Procedure: COLONOSCOPY WITH PROPOFOL;  Surgeon: [name], MD;  Location: [facility];  Service: Gastroenterology;  Laterality: N/A;  . ESOPHAGOGASTRODUODENOSCOPY N/A 06/04/2021   Procedure: ESOPHAGOGASTRODUODENOSCOPY (EGD);  Surgeon: [name], MD;  Location: [facility];  Service: Gastroenterology;  Laterality: N/A;  . ESOPHAGOGASTRODUODENOSCOPY (EGD) WITH PROPOFOL N/A 06/01/2020   Procedure: ESOPHAGOGASTRODUODENOSCOPY (EGD) WITH PROPOFOL;  Surgeon: [name], MD;  Location: [facility];  Service: Gastroenterology;  Laterality: N/A;  . POLYPECTOMY  06/01/2020   Procedure: POLYPECTOMY;  Surgeon: [name], MD;  Location: [facility];  Service: Gastroenterology;;   Medication List:  Current Outpatient Medications  Medication Sig Dispense Refill  . acetaminophen (TYLENOL) 500 MG tablet Take 500-1,000 mg by mouth every 6 (six) hours as needed for moderate pain or headache.    Marland Kitchen amLODipine (NORVASC) 5 MG tablet Take 5 mg by mouth 2 (two) times daily.    . butalbital-acetaminophen-caffeine (FIORICET) 50-325-40 MG tablet Take 1 tablet by mouth 2 (two) times daily as needed for migraine.    . chlorpheniramine (CHLOR-TRIMETON) 4 MG tablet Take 4 mg by mouth every 4 (four) hours as needed for allergies.    . Cholecalciferol (VITAMIN D) 50 MCG (2000 UT) tablet Take 2,000 Units by mouth 2 (two) times a week. Tues and Thurs    . dextromethorphan  (DELSYM) 30 MG/5ML liquid Take 30 mg by mouth 2 (two) times daily as needed for cough.    Marland Kitchen epoetin alfa (EPOGEN) 10000 UNIT/ML injection as directed Injection once every 3 weeks for 3 months    . Homeopathic Products (ARNICARE PAIN RELIEF EX) Apply 1 application. topically daily as needed (pain).    Marland Kitchen loratadine (CLARITIN) 10 MG tablet Take 10 mg by mouth daily as needed for allergies.    . Polyvinyl Alcohol-Povidone (REFRESH OP) Place 1 drop into both eyes daily as needed (dry eyes).    Marland Kitchen Respiratory Therapy Supplies (FLUTTER) DEVI Use as directed. 1 each 0  . rosuvastatin (CRESTOR) 5 MG tablet Take 5 mg by mouth every Tuesday.    . valsartan (DIOVAN) 320 MG tablet Take 320 mg by mouth daily.    . Wheat Dextrin (BENEFIBER PO) Take 1 Scoop by mouth daily.     Current Facility-Administered Medications  Medication Dose Route Frequency Provider Last Rate Last Admin  . desmopressin (DDAVP) 20.4 mcg in sodium chloride 0.9 % 50 mL IVPB  0.3 mcg/kg Intravenous Once Salley Slaughter, PA-C      . desmopressin (DDAVP) 20.4 mcg in sodium chloride 0.9 % 50 mL IVPB  0.3 mcg/kg Intravenous Once Salley Slaughter, PA-C       Allergies: Allergies  Allergen Reactions  . Avelox [Moxifloxacin Hcl In Nacl] Other (See Comments)    confusion  . Avelox [Moxifloxacin] Other (See Comments)  . Azathioprine Other (See Comments)  . Codeine Nausea Only and Other (See Comments)  . Erythromycin     GI upset  . Fexofenadine Other (See Comments)  . Ibuprofen Other (See Comments)  . Levocetirizine Other (See Comments)  . Lisinopril     Headache  . Sulfa Antibiotics Other (See Comments)  . Sulfonamide Derivatives     headache  . Zyrtec [Cetirizine] Other (See Comments)   Social History: Social History   Socioeconomic History  . Marital  status: Married    Spouse name: Not on file  . Number of children: Not on file  . Years of education: Not on file  . Highest education level: Not on file   Occupational History  . Not on file  Tobacco Use  . Smoking status: Former    Packs/day: 0.50    Years: 18.00    Total pack years: 9.00    Types: Cigarettes    Quit date: 07/05/1993    Years since quitting: 28.6  . Smokeless tobacco: Never  Substance and Sexual Activity  . Alcohol use: Not on file  . Drug use: Not on file  . Sexual activity: Not on file  Other Topics Concern  . Not on file  Social History Narrative  . Not on file   Social Determinants of Health   Financial Resource Strain: Not on file  Food Insecurity: Not on file  Transportation Needs: Not on file  Physical Activity: Not on file  Stress: Not on file  Social Connections: Not on file   Lives in a ***. Smoking: *** Occupation: ***  Environmental HistoryFreight forwarder in the house: Estate agent in the family room: {Blank single:19197::"yes","no"} Carpet in the bedroom: {Blank single:19197::"yes","no"} Heating: {Blank single:19197::"electric","gas","heat pump"} Cooling: {Blank single:19197::"central","window","heat pump"} Pet: {Blank single:19197::"yes ***","no"}  Family History: Family History  Problem Relation Age of Onset  . Renal Disease Brother   . Stroke Brother   . Bronchitis Mother   . Heart disease Mother   . Heart disease Father   . Heart attack Father   . Hypertension Sister   . Diabetes Sister   . Heart attack Brother   . Hypertension Sister   . Diabetes Sister    Problem                               Relation Asthma                                   *** Eczema                                *** Food allergy                          *** Allergic rhino conjunctivitis     ***  Review of Systems  Constitutional:  Negative for appetite change, chills, fever and unexpected weight change.  HENT:  Negative for congestion and rhinorrhea.   Eyes:  Negative for itching.  Respiratory:  Negative for cough, chest tightness, shortness of breath and  wheezing.   Cardiovascular:  Negative for chest pain.  Gastrointestinal:  Negative for abdominal pain.  Genitourinary:  Negative for difficulty urinating.  Skin:  Negative for rash.  Neurological:  Negative for headaches.   Objective: There were no vitals taken for this visit. There is no height or weight on file to calculate BMI. Physical Exam Vitals and nursing note reviewed.  Constitutional:      Appearance: Normal appearance. She is well-developed.  HENT:     Head: Normocephalic and atraumatic.     Right Ear: Tympanic membrane and external ear normal.     Left Ear: Tympanic membrane and external ear normal.     Nose: Nose normal.  Mouth/Throat:     Mouth: Mucous membranes are moist.     Pharynx: Oropharynx is clear.  Eyes:     Conjunctiva/sclera: Conjunctivae normal.  Cardiovascular:     Rate and Rhythm: Normal rate and regular rhythm.     Heart sounds: Normal heart sounds. No murmur heard.    No friction rub. No gallop.  Pulmonary:     Effort: Pulmonary effort is normal.     Breath sounds: Normal breath sounds. No wheezing, rhonchi or rales.  Musculoskeletal:     Cervical back: Neck supple.  Skin:    General: Skin is warm.     Findings: No rash.  Neurological:     Mental Status: She is alert and oriented to person, place, and time.  Psychiatric:        Behavior: Behavior normal.  The plan was reviewed with the patient/family, and all questions/concerned were addressed.  It was my pleasure to see Brandy Medina today and participate in her care. Please feel free to contact me with any questions or concerns.  Sincerely,  Rexene Alberts, DO Allergy & Immunology  [facility] office: [place] office: [phone]

## 2022-02-05 ENCOUNTER — Other Ambulatory Visit: Payer: Self-pay

## 2022-02-05 ENCOUNTER — Encounter: Payer: Self-pay | Admitting: Allergy

## 2022-02-05 ENCOUNTER — Ambulatory Visit: Payer: Medicare PPO | Admitting: Allergy

## 2022-02-05 VITALS — BP 128/78 | HR 109 | Temp 98.0°F | Resp 18 | Ht 64.0 in | Wt 165.1 lb

## 2022-02-05 DIAGNOSIS — J310 Chronic rhinitis: Secondary | ICD-10-CM

## 2022-02-05 DIAGNOSIS — B999 Unspecified infectious disease: Secondary | ICD-10-CM

## 2022-02-05 DIAGNOSIS — Z713 Dietary counseling and surveillance: Secondary | ICD-10-CM | POA: Diagnosis not present

## 2022-02-05 NOTE — Assessment & Plan Note (Addendum)
Frequent bronchitis and has bronchiectasis. Normal alpha-1 antitrypsin level. Follows with pulmonology for ILD. Keep track of infections and antibiotics use. Get bloodwork to look at immune system.

## 2022-02-05 NOTE — Assessment & Plan Note (Signed)
No indication to avoid any foods from allergy standpoint. However if you notice certain things make you have headaches or worsen the nasal congestion then try to limit from your diet.

## 2022-02-05 NOTE — Patient Instructions (Addendum)
Today's skin testing showed: Negative to indoor/outdoor allergens. Negative to common foods.   Results given.  Non-allergic rhinitis Use Nasacort (triamcinolone) nasal spray 1 spray per nostril once a day as needed for nasal congestion.  If you start having nosebleeds then stop the nasal spray.   Nose Bleeds: Nosebleeds are very common.  Site of the bleeding is typically on the septum or at the very front of the nose.  Some of the more common causes are from trauma, inflammation or medication induced. Pinch both nostrils while leaning forward for at least 5 minutes before checking to see if the bleeding has stopped. If bleeding is not controlled within 5-10 minutes apply a cotton ball soaked with oxymetazoline (Afrin) to the bleeding nostril for a few seconds.  Preventative treatment: Apply saline nasal gel in each nostril twice a day for 2 weeks to allow the nasal mucosa to heal Consider using a humidifier in the winter Try to keep your blood pressure as normal as possible (120/80)  Foods No indication to avoid any foods from allergy standpoint. However if you notice certain things make you have headaches or worsen the nasal congestion then try to limit from your diet.   Recurrent infections Keep track of infections and antibiotics use. Get bloodwork to look at immune system.   Follow up in 2 months or sooner if needed.

## 2022-02-05 NOTE — Assessment & Plan Note (Addendum)
Patient with a complicated medical history (interstitial lung disease, Sjogren's and anemia) with worsening nasal symptoms. 2016 blood work was borderline positive to dust mites IgE 0.18 and 0.16).  Apparently skin testing many years ago was also positive to dust mites.  No prior AIT.  Flonase caused epistaxis requiring cauterization in the past.  Follows with Dr. Benjamine Mola. Concerned about allergic triggers. Today's skin testing showed: Negative to indoor/outdoor allergens. Negative to common foods.  Most likely has non-allergic chronic rhinitis.  Use Nasacort (triamcinolone) nasal spray 1 spray per nostril once a day as needed for nasal congestion.  Demonstrated proper use and sample given.  If you start having nosebleeds then stop the nasal spray.

## 2022-02-12 LAB — CBC WITH DIFFERENTIAL/PLATELET
Basophils Absolute: 0 10*3/uL (ref 0.0–0.2)
Basos: 1 %
EOS (ABSOLUTE): 0 10*3/uL (ref 0.0–0.4)
Eos: 1 %
Hematocrit: 29.5 % — ABNORMAL LOW (ref 34.0–46.6)
Hemoglobin: 9.8 g/dL — ABNORMAL LOW (ref 11.1–15.9)
Immature Grans (Abs): 0 10*3/uL (ref 0.0–0.1)
Immature Granulocytes: 0 %
Lymphocytes Absolute: 0.9 10*3/uL (ref 0.7–3.1)
Lymphs: 24 %
MCH: 31 pg (ref 26.6–33.0)
MCHC: 33.2 g/dL (ref 31.5–35.7)
MCV: 93 fL (ref 79–97)
Monocytes Absolute: 0.4 10*3/uL (ref 0.1–0.9)
Monocytes: 12 %
Neutrophils Absolute: 2.3 10*3/uL (ref 1.4–7.0)
Neutrophils: 62 %
Platelets: 150 10*3/uL (ref 150–450)
RBC: 3.16 x10E6/uL — ABNORMAL LOW (ref 3.77–5.28)
RDW: 15.1 % (ref 11.7–15.4)
WBC: 3.6 10*3/uL (ref 3.4–10.8)

## 2022-02-12 LAB — STREP PNEUMONIAE 23 SEROTYPES IGG
Pneumo Ab Type 1*: 4.6 ug/mL (ref >1.3)
Pneumo Ab Type 12 (12F)*: 0.5 ug/mL — ABNORMAL LOW (ref >1.3)
Pneumo Ab Type 14*: 9.6 ug/mL (ref >1.3)
Pneumo Ab Type 17 (17F)*: 11.7 ug/mL (ref >1.3)
Pneumo Ab Type 19 (19F)*: 3.6 ug/mL (ref >1.3)
Pneumo Ab Type 2*: 4.9 ug/mL (ref >1.3)
Pneumo Ab Type 20*: 6.1 ug/mL (ref >1.3)
Pneumo Ab Type 22 (22F)*: 1.5 ug/mL (ref >1.3)
Pneumo Ab Type 23 (23F)*: 0.7 ug/mL — ABNORMAL LOW (ref >1.3)
Pneumo Ab Type 26 (6B)*: 1 ug/mL — ABNORMAL LOW (ref >1.3)
Pneumo Ab Type 3*: 0.8 ug/mL — ABNORMAL LOW (ref >1.3)
Pneumo Ab Type 34 (10A)*: 0.6 ug/mL — ABNORMAL LOW (ref >1.3)
Pneumo Ab Type 4*: 1 ug/mL — ABNORMAL LOW (ref >1.3)
Pneumo Ab Type 43 (11A)*: 0.4 ug/mL — ABNORMAL LOW (ref >1.3)
Pneumo Ab Type 5*: 0.6 ug/mL — ABNORMAL LOW (ref >1.3)
Pneumo Ab Type 51 (7F)*: 1 ug/mL — ABNORMAL LOW (ref >1.3)
Pneumo Ab Type 54 (15B)*: 2.5 ug/mL (ref >1.3)
Pneumo Ab Type 56 (18C)*: 3.9 ug/mL (ref >1.3)
Pneumo Ab Type 57 (19A)*: 1.8 ug/mL (ref >1.3)
Pneumo Ab Type 68 (9V)*: 0.6 ug/mL — ABNORMAL LOW (ref >1.3)
Pneumo Ab Type 70 (33F)*: >10.1 ug/mL (ref >1.3)
Pneumo Ab Type 8*: 1.9 ug/mL (ref >1.3)
Pneumo Ab Type 9 (9N)*: 1.7 ug/mL (ref >1.3)

## 2022-02-12 LAB — IGG 1, 2, 3, AND 4
IgG (Immunoglobin G), Serum: 3897 mg/dL — ABNORMAL HIGH (ref 586–1602)
IgG, Subclass 1: 2918 mg/dL — ABNORMAL HIGH (ref 248–810)
IgG, Subclass 2: 473 mg/dL (ref 130–555)
IgG, Subclass 3: 151 mg/dL — ABNORMAL HIGH (ref 15–102)
IgG, Subclass 4: 15 mg/dL (ref 2–96)

## 2022-02-12 LAB — COMPLEMENT, TOTAL: Compl, Total (CH50): 57 U/mL (ref >41)

## 2022-02-12 LAB — IGG, IGA, IGM
IgA/Immunoglobulin A, Serum: 154 mg/dL (ref 64–422)
IgM (Immunoglobulin M), Srm: 395 mg/dL — ABNORMAL HIGH (ref 26–217)

## 2022-02-12 LAB — DIPHTHERIA / TETANUS ANTIBODY PANEL
Diphtheria Ab: 0.53 IU/mL (ref <0.10)
Tetanus Ab, IgG: 3.88 IU/mL (ref <0.10)

## 2022-02-13 ENCOUNTER — Ambulatory Visit: Payer: Medicare PPO | Admitting: Nurse Practitioner

## 2022-02-18 ENCOUNTER — Ambulatory Visit: Payer: Medicare PPO | Admitting: Pulmonary Disease

## 2022-02-18 ENCOUNTER — Encounter: Payer: Self-pay | Admitting: Pulmonary Disease

## 2022-02-18 VITALS — BP 124/70 | HR 81 | Temp 98.2°F | Ht 64.0 in | Wt 162.6 lb

## 2022-02-18 DIAGNOSIS — J849 Interstitial pulmonary disease, unspecified: Secondary | ICD-10-CM

## 2022-02-18 DIAGNOSIS — Z5181 Encounter for therapeutic drug level monitoring: Secondary | ICD-10-CM

## 2022-02-18 NOTE — Progress Notes (Signed)
Brandy Medina    [id number]    1950/03/07  Primary Care Physician:Husain, Denton Ar, MD  Referring Physician: Wenda Low, MD [place] [address] [place],  [place] [address]  Chief complaint: Follow up for interstitial lung disease, Sjogren's syndrome  HPI: 72 y.o. patient with history of von Willebrand's disease, Sjogren's syndrome, maintained on Plaquenil which is stopped due to darkening skin.  Started on Imuran by Dr. Amil Amen, rheumatology Diagnosed with streptococcal pneumonia in February 2022 which is treated with antibiotics CT scan shows progressive UIP fibrosis and she has been referred to pulmonary clinic for further evaluation  Complains of minimal cough, chronic dyspnea on exertion which is progressive in nature Has dry mouth, dry eyes  Azathioprine was held in February 2022 due to pancytopenia, elevated LFTs.  She is undergoing work-up with Dr. Lindi Adie for this.  She does have formal diagnosis of von Willebrand's disease but on repeat testing does not have any evidence of this.  She may need a bone marrow biopsy  She started Esbriet on 04/26/2020.  It was initially well-tolerated but last week she developed side effects of dizziness, lightheadedness, headache, loss of appetite.  She has held the [place] and is now feeling better.  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  She has done cushions in the living room couch but exposures is minimal Smoking history: 9  pack-year smoker.  Quit in 1995 Travel history: Originally from [place].  No significant recent travel Relevant family history: No family history of lung disease  Interim history: Continues of Esbriet.  She is also not taking azathioprine and Plaquenil for her Sjogren's Continues follow-up with Dr. Lindi Adie for anemia and is getting Aranesp injections.  Continues to be off Esbriet.  She is here for review of CT scan  Outpatient Encounter  Medications as of 02/18/2022  Medication Sig   acetaminophen (TYLENOL) 500 MG tablet Take 500-1,000 mg by mouth every 6 (six) hours as needed for moderate pain or headache.   amLODipine (NORVASC) 5 MG tablet Take 5 mg by mouth 2 (two) times daily.   butalbital-acetaminophen-caffeine (FIORICET) 50-325-40 MG tablet Take 1 tablet by mouth 2 (two) times daily as needed for migraine.   chlorpheniramine (CHLOR-TRIMETON) 4 MG tablet Take 4 mg by mouth every 4 (four) hours as needed for allergies.   Cholecalciferol (VITAMIN D) 50 MCG (2000 UT) tablet Take 2,000 Units by mouth 2 (two) times a week. Tues and Thurs   dextromethorphan (DELSYM) 30 MG/5ML liquid Take 30 mg by mouth 2 (two) times daily as needed for cough.   epoetin alfa (EPOGEN) 10000 UNIT/ML injection as directed Injection once every 3 weeks for 3 months   Homeopathic Products (ARNICARE PAIN RELIEF EX) Apply 1 application. topically daily as needed (pain).   loratadine (CLARITIN) 10 MG tablet Take 10 mg by mouth daily as needed for allergies.   Respiratory Therapy Supplies (FLUTTER) DEVI Use as directed.   rosuvastatin (CRESTOR) 5 MG tablet Take 5 mg by mouth every Tuesday.   valsartan (DIOVAN) 320 MG tablet Take 320 mg by mouth daily.   Wheat Dextrin (BENEFIBER PO) Take 1 Scoop by mouth daily.   Facility-Administered Encounter Medications as of 02/18/2022  Medication   desmopressin (DDAVP) 20.4 mcg in sodium chloride 0.9 % 50 mL IVPB   desmopressin (DDAVP) 20.4 mcg in sodium chloride 0.9 % 50 mL IVPB  , Physical Exam: Blood pressure 124/70, pulse 81, temperature 98.2  F (36.8 C), temperature source Oral, height '5\' 4"'$  (1.626 m), weight 162 lb 9.6 oz (73.8 kg), SpO2 97 %. Gen:      No acute distress HEENT:  EOMI, sclera anicteric Neck:     No masses; no thyromegaly Lungs:    Bibasal crackles CV:         Regular rate and rhythm; no murmurs Abd:      + bowel sounds; soft, non-tender; no palpable masses, no distension Ext:    No edema;  adequate peripheral perfusion Skin:      Warm and dry; no rash Neuro: alert and oriented x 3 Psych: normal mood and affect   Data Reviewed: Imaging: CT chest 02/20/2020-moderate to severe pulmonary fibrosis in UIP pattern.  Worsened since previous scan in 2010, groundglass opacities in the peripheral right upper lobe.   High-resolution CT 12/13/2021-slightly worsened pulmonary fibrosis in UIP pattern. I have reviewed the images personally.  PFTs: 04/05/2020 FVC 2.66 [108%], FEV1 2.18 [114%], DLCO 8.23 [41%]  11/13/2021 FVC 2.79 [94%], FEV1 2.37 [105%], F/F 85, TLC 4.39 [86%], DLCO 10.33 [53%] Moderate diffusion defect  Labs: CMP 05/14/2020- AST 18, ALT 9  Assessment:  UIP fibrosis Sjogren's syndrome She has progressive fibrosis as noted on the CT scan and worsening PFTs compared to 2016 in 2021  She has not tolerated Esbriet.  I am not sure if the side effects were due to the Esbriet medication or infection as she had a temperature of 103 around the same time. Nonetheless she wants to avoid a retrial at this time. She is also currently off Imuran and Plaquenil which was started by rheumatology.  We discussed retrial of antifibrotic therapy with Ofev or Esbriet but she is very reluctant to try them.   Recheck CT and PFTs in November 2024.  Sinus congestion, postnasal drip She has not tolerated steroid nasal sprays in the past due to epistaxis Flutter valve for mucociliary clearance  Plan/Recommendations: Hold off on antifibrotic therapy per patient preference Follow-up CT, PFTs later this year Flutter valve  Marshell Garfinkel MD [facility] 02/18/2022, 2:41 PM  CC: Wenda Low, MD

## 2022-02-18 NOTE — Addendum Note (Signed)
Addended by: Lorretta Harp on: 02/18/2022 03:59 PM   Modules accepted: Orders

## 2022-02-18 NOTE — Patient Instructions (Addendum)
We reviewed his CT scan which shows slight progression of the scarring in the lung Based on our discussion will not treat but continue to monitor Order high-res CT and PFTs without bronchodilator response in November 2024 Return to clinic after these tests for review and plan for next steps

## 2022-02-20 DIAGNOSIS — E663 Overweight: Secondary | ICD-10-CM | POA: Diagnosis not present

## 2022-02-20 DIAGNOSIS — M542 Cervicalgia: Secondary | ICD-10-CM | POA: Diagnosis not present

## 2022-02-20 DIAGNOSIS — M5134 Other intervertebral disc degeneration, thoracic region: Secondary | ICD-10-CM | POA: Diagnosis not present

## 2022-02-20 DIAGNOSIS — D638 Anemia in other chronic diseases classified elsewhere: Secondary | ICD-10-CM | POA: Diagnosis not present

## 2022-02-20 DIAGNOSIS — J849 Interstitial pulmonary disease, unspecified: Secondary | ICD-10-CM | POA: Diagnosis not present

## 2022-02-20 DIAGNOSIS — Z6827 Body mass index (BMI) 27.0-27.9, adult: Secondary | ICD-10-CM | POA: Diagnosis not present

## 2022-02-20 DIAGNOSIS — D890 Polyclonal hypergammaglobulinemia: Secondary | ICD-10-CM | POA: Diagnosis not present

## 2022-02-20 DIAGNOSIS — M3501 Sicca syndrome with keratoconjunctivitis: Secondary | ICD-10-CM | POA: Diagnosis not present

## 2022-03-03 DIAGNOSIS — N182 Chronic kidney disease, stage 2 (mild): Secondary | ICD-10-CM | POA: Diagnosis not present

## 2022-03-03 DIAGNOSIS — K219 Gastro-esophageal reflux disease without esophagitis: Secondary | ICD-10-CM | POA: Diagnosis not present

## 2022-03-03 DIAGNOSIS — E7800 Pure hypercholesterolemia, unspecified: Secondary | ICD-10-CM | POA: Diagnosis not present

## 2022-03-03 DIAGNOSIS — D649 Anemia, unspecified: Secondary | ICD-10-CM | POA: Diagnosis not present

## 2022-03-03 DIAGNOSIS — E1122 Type 2 diabetes mellitus with diabetic chronic kidney disease: Secondary | ICD-10-CM | POA: Diagnosis not present

## 2022-03-03 DIAGNOSIS — I10 Essential (primary) hypertension: Secondary | ICD-10-CM | POA: Diagnosis not present

## 2022-03-15 ENCOUNTER — Ambulatory Visit
Admission: EM | Admit: 2022-03-15 | Discharge: 2022-03-15 | Disposition: A | Discharge status: Home / Self Care | Payer: Medicare PPO | Attending: Physician Assistant | Admitting: Physician Assistant

## 2022-03-15 DIAGNOSIS — M545 Low back pain, unspecified: Secondary | ICD-10-CM

## 2022-03-15 LAB — POCT URINALYSIS DIP (MANUAL ENTRY)
Bilirubin, UA: NEGATIVE
Blood, UA: NEGATIVE
Glucose, UA: NEGATIVE mg/dL
Ketones, POC UA: NEGATIVE mg/dL
Leukocytes, UA: NEGATIVE
Nitrite, UA: NEGATIVE
Protein Ur, POC: NEGATIVE mg/dL
Spec Grav, UA: 1.02 (ref 1.010–1.025)
Urobilinogen, UA: 0.2 E.U./dL
pH, UA: 5.5 (ref 5.0–8.0)

## 2022-03-15 MED ORDER — PREDNISONE 20 MG PO TABS: 40.0000 mg | ORAL_TABLET | Freq: Every day | ORAL | 0 refills | Status: AC

## 2022-03-15 NOTE — ED Provider Notes (Signed)
[facility]    CSN: [id number] Arrival date & time: 03/15/22  0800      History   Chief Complaint Chief Complaint  Patient presents with   Back Pain    HPI Brandy Medina is a 72 y.o. female.   Patient here today for evaluation of lower back pain to her right side that started about 3 days ago.  She denies any known injury.  She reports that she does have worsened pain with movement.  She denies any vomiting or diarrhea.  She has tried over-the-counter medication without resolution.  The history is provided by the patient.  Back Pain Associated symptoms: no abdominal pain, no dysuria and no fever     Past Medical History:  Diagnosis Date   Allergic rhinitis    Diabetes ([place])    HTN (hypertension)    Migraines    PONV (postoperative nausea and vomiting)    Pulmonary fibrosis ([place])     Patient Active Problem List   Diagnosis Date Noted   Chronic rhinitis 02/05/2022   Recurrent infections 02/05/2022   Dietary counseling and surveillance 02/05/2022   Anemia of infection and chronic disease 07/23/2021   Von Willebrand disease ([place]) 04/17/2020   Normocytic anemia 04/17/2020   ILD (interstitial lung disease) ([place]) 02/27/2020   Sjogren's syndrome ([place]) 02/27/2020   Abnormal findings on diagnostic imaging of lung 02/27/2020   Abnormal ECG 12/12/2015   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   Essential hypertension 07/07/2014   Upper airway cough syndrome 07/06/2014   Obstructive bronchiectasis ([place]) 06/20/2009   Nonspecific (abnormal) findings on radiological and other examination of body structure 08/02/2008   COMPUTERIZED TOMOGRAPHY, CHEST, ABNORMAL 08/02/2008   HYPOGLYCEMIA, REACTIVE 08/01/2008   MIGRAINE HEADACHE 08/01/2008   Sinusitis, chronic 08/01/2008   GERD 08/01/2008   ABDOMINAL PAIN, CHRONIC 08/01/2008   ANEMIA, IRON DEFICIENCY, HX OF 08/01/2008   DIVERTICULOSIS, COLON, HX OF 08/01/2008    Past Surgical  History:  Procedure Laterality Date   ABDOMINAL HYSTERECTOMY     BIOPSY  06/01/2020   Procedure: BIOPSY;  Surgeon: [name], MD;  Location: [facility];  Service: Gastroenterology;;  EGD and COLON   BIOPSY  06/04/2021   Procedure: BIOPSY;  Surgeon: [name], MD;  Location: [facility];  Service: Gastroenterology;;   BREAST SURGERY     COLONOSCOPY WITH PROPOFOL N/A 06/01/2020   Procedure: COLONOSCOPY WITH PROPOFOL;  Surgeon: [name], MD;  Location: [facility];  Service: Gastroenterology;  Laterality: N/A;   ESOPHAGOGASTRODUODENOSCOPY N/A 06/04/2021   Procedure: ESOPHAGOGASTRODUODENOSCOPY (EGD);  Surgeon: [name], MD;  Location: [facility];  Service: Gastroenterology;  Laterality: N/A;   ESOPHAGOGASTRODUODENOSCOPY (EGD) WITH PROPOFOL N/A 06/01/2020   Procedure: ESOPHAGOGASTRODUODENOSCOPY (EGD) WITH PROPOFOL;  Surgeon: [name], MD;  Location: [facility];  Service: Gastroenterology;  Laterality: N/A;   POLYPECTOMY  06/01/2020   Procedure: POLYPECTOMY;  Surgeon: [name], MD;  Location: [facility];  Service: Gastroenterology;;    OB History   No obstetric history on file.      Home Medications    Prior to Admission medications   Medication Sig Start Date End Date Taking? Authorizing Provider  predniSONE (DELTASONE) 20 MG tablet Take 2 tablets (40 mg total) by mouth daily with breakfast for 5 days. 03/15/22 03/20/22 Yes Francene Finders, PA-C  acetaminophen (TYLENOL) 500 MG tablet Take 500-1,000 mg by mouth every 6 (six) hours as needed for moderate pain or headache.    [provider]  amLODipine ([place])  5 MG tablet Take 5 mg by mouth 2 (two) times daily. 01/17/20   [provider]  butalbital-acetaminophen-caffeine (FIORICET) 50-325-40 MG tablet Take 1 tablet by mouth 2 (two) times daily as needed for migraine.    [provider]  chlorpheniramine (CHLOR-TRIMETON) 4 MG tablet Take 4 mg by mouth every 4 (four) hours as needed for allergies.     [provider]  Cholecalciferol (VITAMIN D) 50 MCG (2000 UT) tablet Take 2,000 Units by mouth 2 (two) times a week. Tues and Thurs    [provider]  dextromethorphan (DELSYM) 30 MG/5ML liquid Take 30 mg by mouth 2 (two) times daily as needed for cough.    [provider]  epoetin alfa (EPOGEN) 10000 UNIT/ML injection as directed Injection once every 3 weeks for 3 months    [provider]  Homeopathic Products (ARNICARE PAIN RELIEF EX) Apply 1 application. topically daily as needed (pain).    [provider]  loratadine (CLARITIN) 10 MG tablet Take 10 mg by mouth daily as needed for allergies.    [provider]  Respiratory Therapy Supplies (FLUTTER) DEVI Use as directed. 06/29/19   Lauraine Rinne, NP  rosuvastatin (CRESTOR) 5 MG tablet Take 5 mg by mouth every Tuesday. 05/13/21   [provider]  valsartan (DIOVAN) 320 MG tablet Take 320 mg by mouth daily. 03/22/21   [provider]  Wheat Dextrin (BENEFIBER PO) Take 1 Scoop by mouth daily.    [provider]    Family History Family History  Problem Relation Age of Onset   Renal Disease Brother    Stroke Brother    Bronchitis Mother    Heart disease Mother    Heart disease Father    Heart attack Father    Hypertension Sister    Diabetes Sister    Heart attack Brother    Hypertension Sister    Diabetes Sister     Social History Social History   Tobacco Use   Smoking status: Former    Packs/day: 0.50    Years: 18.00    Total pack years: 9.00    Types: Cigarettes    Quit date: 07/05/1993    Years since quitting: 28.7   Smokeless tobacco: Never     Allergies   Avelox [moxifloxacin hcl in nacl], Avelox [moxifloxacin], Azathioprine, Codeine, Erythromycin, Fexofenadine, Ibuprofen, Levocetirizine, Lisinopril, Sulfa antibiotics, Sulfonamide derivatives, and Zyrtec [cetirizine]   Review of Systems Review of Systems  Constitutional:  Negative for  chills and fever.  Eyes:  Negative for discharge and redness.  Gastrointestinal:  Negative for abdominal pain, nausea and vomiting.  Genitourinary:  Negative for dysuria and frequency.  Musculoskeletal:  Positive for back pain.     Physical Exam Triage Vital Signs ED Triage Vitals [03/15/22 0826]  Enc Vitals Group     BP (!) 175/96     Pulse Rate 78     Resp 17     Temp 97.7 F (36.5 C)     Temp Source Oral     SpO2 95 %     Weight      Height      Head Circumference      Peak Flow      Pain Score 6     Pain Loc      Pain Edu?      Excl. in [place]?    No data found.  Updated Vital Signs BP (!) 175/96 (BP Location: Left Arm)   Pulse  78   Temp 97.7 F (36.5 C) (Oral)   Resp 17   SpO2 95%    Physical Exam Vitals and nursing note reviewed.  Constitutional:      General: She is not in acute distress.    Appearance: Normal appearance. She is not ill-appearing.  HENT:     Head: Normocephalic and atraumatic.  Eyes:     Conjunctiva/sclera: Conjunctivae normal.  Cardiovascular:     Rate and Rhythm: Normal rate.  Pulmonary:     Effort: Pulmonary effort is normal. No respiratory distress.  Musculoskeletal:     Comments: No TTP to midline spine, mild TTP to right low back  Neurological:     Mental Status: She is alert.  Psychiatric:        Mood and Affect: Mood normal.        Behavior: Behavior normal.        Thought Content: Thought content normal.      UC Treatments / Results  Labs (all labs ordered are listed, but only abnormal results are displayed) Labs Reviewed  POCT URINALYSIS DIP (MANUAL ENTRY) - Abnormal; Notable for the following components:      Result Value   Color, UA light yellow (*)    All other components within normal limits    EKG   Radiology No results found.  Procedures Procedures (including critical care time)  Medications Ordered in UC Medications - No data to display  Initial Impression / Assessment and Plan / UC Course  I  have reviewed the triage vital signs and the nursing notes.  Pertinent labs & imaging results that were available during my care of the patient were reviewed by me and considered in my medical decision making (see chart for details).    Steroid burst prescribed for susepcted MSK cause of symptoms given unconcerned UA results. Recommend follow up if no improvement or with any further concerns.   Final Clinical Impressions(s) / UC Diagnoses   Final diagnoses:  Acute right-sided low back pain without sciatica   Discharge Instructions   None    ED Prescriptions     Medication Sig Dispense Auth. Provider   predniSONE (DELTASONE) 20 MG tablet Take 2 tablets (40 mg total) by mouth daily with breakfast for 5 days. 10 tablet Francene Finders, PA-C      PDMP not reviewed this encounter.   Francene Finders, PA-C 03/15/22 1451

## 2022-03-15 NOTE — ED Triage Notes (Signed)
Pt presents with lower back pain on right side X 3 days with no known injury; pt states she hs degenerative arthritis.

## 2022-04-14 ENCOUNTER — Ambulatory Visit: Payer: Medicare PPO | Admitting: Allergy

## 2022-04-22 NOTE — Progress Notes (Signed)
Patient Care Team: Wenda Low, MD as PCP - General (Internal Medicine) Nicholas Lose, MD as Consulting Physician (Hematology and Oncology)  DIAGNOSIS:  Encounter Diagnosis  Name Primary?   Normocytic anemia Yes   CHIEF COMPLIANT:  Follow-up of anemia of chronic disease/normocytic anemia   INTERVAL HISTORY: Brandy Medina is a  72 y.o. female with above-mentioned history of iron deficiency anemia and Von Willebrand's disease. She presents to the clinic for labs and follow-up. She reports her energy level has not changed from before she stared injections. It has not improve.    ALLERGIES:  is allergic to avelox [moxifloxacin hcl in nacl], avelox [moxifloxacin], azathioprine, codeine, erythromycin, fexofenadine, ibuprofen, levocetirizine, lisinopril, sulfa antibiotics, sulfonamide derivatives, and zyrtec [cetirizine].  MEDICATIONS:  Current Outpatient Medications  Medication Sig Dispense Refill   acetaminophen (TYLENOL) 500 MG tablet Take 500-1,000 mg by mouth every 6 (six) hours as needed for moderate pain or headache.     amLODipine (NORVASC) 5 MG tablet Take 5 mg by mouth 2 (two) times daily.     butalbital-acetaminophen-caffeine (FIORICET) 50-325-40 MG tablet Take 1 tablet by mouth 2 (two) times daily as needed for migraine.     chlorpheniramine (CHLOR-TRIMETON) 4 MG tablet Take 4 mg by mouth every 4 (four) hours as needed for allergies.     Cholecalciferol (VITAMIN D) 50 MCG (2000 UT) tablet Take 2,000 Units by mouth 2 (two) times a week. Tues and Thurs     dextromethorphan (DELSYM) 30 MG/5ML liquid Take 30 mg by mouth 2 (two) times daily as needed for cough.     epoetin alfa (EPOGEN) 10000 UNIT/ML injection as directed Injection once every 3 weeks for 3 months     Homeopathic Products (ARNICARE PAIN RELIEF EX) Apply 1 application. topically daily as needed (pain).     loratadine (CLARITIN) 10 MG tablet Take 10 mg by mouth daily as needed for allergies.     Respiratory Therapy  Supplies (FLUTTER) DEVI Use as directed. 1 each 0   rosuvastatin (CRESTOR) 5 MG tablet Take 5 mg by mouth every Tuesday.     valsartan (DIOVAN) 320 MG tablet Take 320 mg by mouth daily.     Wheat Dextrin (BENEFIBER PO) Take 1 Scoop by mouth daily.     Current Facility-Administered Medications  Medication Dose Route Frequency Provider Last Rate Last Admin   desmopressin (DDAVP) 20.4 mcg in sodium chloride 0.9 % 50 mL IVPB  0.3 mcg/kg Intravenous Once Salley Slaughter, PA-C       desmopressin (DDAVP) 20.4 mcg in sodium chloride 0.9 % 50 mL IVPB  0.3 mcg/kg Intravenous Once Salley Slaughter, PA-C        PHYSICAL EXAMINATION: ECOG PERFORMANCE STATUS: 1 - Symptomatic but completely ambulatory  Vitals:   04/28/22 0833  BP: (!) 147/87  Pulse: 94  Resp: 18  Temp: (!) 97.5 F (36.4 C)  SpO2: 98%   Filed Weights   04/28/22 0833  Weight: 161 lb 12.8 oz (73.4 kg)      LABORATORY DATA:  I have reviewed the data as listed    Latest Ref Rng & Units 02/03/2022    8:30 AM 12/24/2021    8:17 AM 11/05/2021   10:11 AM  CMP  Glucose 70 - 99 mg/dL 96  102  110   BUN 8 - 23 mg/dL 19  24  20    Creatinine 0.44 - 1.00 mg/dL 0.89  0.97  0.91   Sodium 135 - 145 mmol/L 133  134  133  Potassium 3.5 - 5.1 mmol/L 4.1  4.4  3.9   Chloride 98 - 111 mmol/L 102  102  102   CO2 22 - 32 mmol/L 26  26  28    Calcium 8.9 - 10.3 mg/dL 9.8  9.7  9.3   Total Protein 6.5 - 8.1 g/dL 9.0  9.9  9.4   Total Bilirubin 0.3 - 1.2 mg/dL 0.4  0.4  0.5   Alkaline Phos 38 - 126 U/L 63  66  58   AST 15 - 41 U/L 22  22  21    ALT 0 - 44 U/L 16  16  14      Lab Results  Component Value Date   WBC 3.3 (L) 04/28/2022   HGB 10.4 (L) 04/28/2022   HCT 31.0 (L) 04/28/2022   MCV 96.3 04/28/2022   PLT 248 04/28/2022   NEUTROABS 1.9 04/28/2022    ASSESSMENT & PLAN:  Normocytic anemia 04/17/2020: Hemoglobin 7.9, MCV 92.2, WBC 4.9, platelets 88, erythropoietin 38.9, ferritin 2445, iron saturation 33%, LDH 196, SPEP:  Polyclonal rise in immunoglobulins, no M protein, AST 55, ALT 66, alkaline phosphatase 496, creatinine 1 07/06/2020: WBC 3.1, hemoglobin 9.4, MCV 99, platelets 254, ANC 1.7, ESR 41, CRP 1, total protein 9.3, globulins 5.2, rest of the LFTs are normal 10/07/2021: Hemoglobin 10.4 04/28/2022: Hemoglobin 10.4 (this is without Retacrit injections)   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 Pylori   Indication for bone marrow biopsy: If ANC drops below 1 and development of other cytopenias   Current treatment: Retacrit 40,000 units every 3 weeks started 08/05/2021, last injection on 11/05/2021 We discontinued Retacrit injection   Leukopenia: ANC is 1.9.  Therefore there is no role for bone marrow biopsy at this time. I will see her back in 3 months with labs and follow-up.    Orders Placed This Encounter  Procedures   CBC with Differential ([place] Only)    Standing Status:   Future    Standing Expiration Date:   04/28/2023   Ferritin    Standing Status:   Future    Standing Expiration Date:   04/28/2023   Iron and Iron Binding Capacity ([facility] only)    Standing Status:   Future    Standing Expiration Date:   04/28/2023   CMP ([place] only)    Standing Status:   Future    Standing Expiration Date:   04/28/2023   The patient has a good understanding of the overall plan. she agrees with it. she will call with any problems that may develop before the next visit here. Total time spent: 30 mins including face to face time and time spent for planning, charting and co-ordination of care   Harriette Ohara, MD 04/28/22    I Gardiner Coins am acting as a Education administrator for [employer]  I have reviewed the above documentation for accuracy and completeness, and I agree with the above.

## 2022-04-28 ENCOUNTER — Other Ambulatory Visit: Payer: Self-pay

## 2022-04-28 ENCOUNTER — Inpatient Hospital Stay: Payer: Medicare PPO | Attending: Hematology and Oncology

## 2022-04-28 ENCOUNTER — Inpatient Hospital Stay: Payer: Medicare PPO | Admitting: Hematology and Oncology

## 2022-04-28 ENCOUNTER — Inpatient Hospital Stay: Payer: Medicare PPO

## 2022-04-28 VITALS — BP 147/87 | HR 94 | Temp 97.5°F | Resp 18 | Ht 64.0 in | Wt 161.8 lb

## 2022-04-28 DIAGNOSIS — D72819 Decreased white blood cell count, unspecified: Secondary | ICD-10-CM | POA: Diagnosis not present

## 2022-04-28 DIAGNOSIS — D649 Anemia, unspecified: Secondary | ICD-10-CM | POA: Insufficient documentation

## 2022-04-28 DIAGNOSIS — Z79899 Other long term (current) drug therapy: Secondary | ICD-10-CM | POA: Diagnosis not present

## 2022-04-28 DIAGNOSIS — B999 Unspecified infectious disease: Secondary | ICD-10-CM

## 2022-04-28 DIAGNOSIS — D6800 Von Willebrand disease, unspecified: Secondary | ICD-10-CM | POA: Insufficient documentation

## 2022-04-28 LAB — CMP (CANCER CENTER ONLY)
ALT: 15 U/L (ref 0–44)
AST: 21 U/L (ref 15–41)
Albumin: 3.9 g/dL (ref 3.5–5.0)
Alkaline Phosphatase: 58 U/L (ref 38–126)
Anion gap: 7 (ref 5–15)
BUN: 25 mg/dL — ABNORMAL HIGH (ref 8–23)
CO2: 26 mmol/L (ref 22–32)
Calcium: 10.1 mg/dL (ref 8.9–10.3)
Chloride: 102 mmol/L (ref 98–111)
Creatinine: 1.01 mg/dL — ABNORMAL HIGH (ref 0.44–1.00)
GFR, Estimated: 60 mL/min — ABNORMAL LOW (ref >60)
Glucose, Bld: 101 mg/dL — ABNORMAL HIGH (ref 70–99)
Potassium: 4.1 mmol/L (ref 3.5–5.1)
Sodium: 135 mmol/L (ref 135–145)
Total Bilirubin: 0.5 mg/dL (ref 0.3–1.2)
Total Protein: 9.9 g/dL — ABNORMAL HIGH (ref 6.5–8.1)

## 2022-04-28 LAB — CBC WITH DIFFERENTIAL (CANCER CENTER ONLY)
Abs Immature Granulocytes: 0 10*3/uL (ref 0.00–0.07)
Basophils Absolute: 0 10*3/uL (ref 0.0–0.1)
Basophils Relative: 1 %
Eosinophils Absolute: 0 10*3/uL (ref 0.0–0.5)
Eosinophils Relative: 1 %
HCT: 31 % — ABNORMAL LOW (ref 36.0–46.0)
Hemoglobin: 10.4 g/dL — ABNORMAL LOW (ref 12.0–15.0)
Immature Granulocytes: 0 %
Lymphocytes Relative: 30 %
Lymphs Abs: 1 10*3/uL (ref 0.7–4.0)
MCH: 32.3 pg (ref 26.0–34.0)
MCHC: 33.5 g/dL (ref 30.0–36.0)
MCV: 96.3 fL (ref 80.0–100.0)
Monocytes Absolute: 0.4 10*3/uL (ref 0.1–1.0)
Monocytes Relative: 12 %
Neutro Abs: 1.9 10*3/uL (ref 1.7–7.7)
Neutrophils Relative %: 56 %
Platelet Count: 248 10*3/uL (ref 150–400)
RBC: 3.22 MIL/uL — ABNORMAL LOW (ref 3.87–5.11)
RDW: 14.6 % (ref 11.5–15.5)
WBC Count: 3.3 10*3/uL — ABNORMAL LOW (ref 4.0–10.5)
nRBC: 0 % (ref 0.0–0.2)

## 2022-04-28 LAB — IRON AND IRON BINDING CAPACITY (CC-WL,HP ONLY)
Iron: 99 ug/dL (ref 28–170)
Saturation Ratios: 32 % — ABNORMAL HIGH (ref 10.4–31.8)
TIBC: 305 ug/dL (ref 250–450)
UIBC: 206 ug/dL (ref 148–442)

## 2022-04-28 LAB — FERRITIN: Ferritin: 316 ng/mL — ABNORMAL HIGH (ref 11–307)

## 2022-04-28 NOTE — Assessment & Plan Note (Signed)
04/17/2020: Hemoglobin 7.9, MCV 92.2, WBC 4.9, platelets 88, erythropoietin 38.9, ferritin 2445, iron saturation 33%, LDH 196, SPEP: Polyclonal rise in immunoglobulins, no M protein, AST 55, ALT 66, alkaline phosphatase 496, creatinine 1 07/06/2020: WBC 3.1, hemoglobin 9.4, MCV 99, platelets 254, ANC 1.7, ESR 41, CRP 1, total protein 9.3, globulins 5.2, rest of the LFTs are normal 10/07/2021: Hemoglobin 10.4   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 Pylori   Indication for bone marrow biopsy: If ANC drops below 1 and development of other cytopenias   Current treatment: Retacrit 40,000 units every 3 weeks started 08/05/2021, last injection on 11/05/2021 She complained of intermittent headaches from Retacrit.   Leukopenia: ANC is up to 2.2.  Therefore there is no role for bone marrow biopsy at this time. I will see her back in 3 months with labs and follow-up.

## 2022-04-29 ENCOUNTER — Telehealth: Payer: Self-pay | Admitting: Hematology and Oncology

## 2022-04-29 NOTE — Telephone Encounter (Signed)
Scheduled appointments per 4/1 los. Patient is aware of the made appointments.

## 2022-05-14 NOTE — Telephone Encounter (Signed)
Since calling the office returning a call to have results discussed, pt has had a visit with the office and results were discussed during that visit. Closing encounter.

## 2022-06-04 DIAGNOSIS — R202 Paresthesia of skin: Secondary | ICD-10-CM | POA: Diagnosis not present

## 2022-06-04 DIAGNOSIS — I10 Essential (primary) hypertension: Secondary | ICD-10-CM | POA: Diagnosis not present

## 2022-06-04 DIAGNOSIS — E1140 Type 2 diabetes mellitus with diabetic neuropathy, unspecified: Secondary | ICD-10-CM | POA: Diagnosis not present

## 2022-06-09 DIAGNOSIS — J310 Chronic rhinitis: Secondary | ICD-10-CM | POA: Diagnosis not present

## 2022-06-09 DIAGNOSIS — J343 Hypertrophy of nasal turbinates: Secondary | ICD-10-CM | POA: Diagnosis not present

## 2022-06-09 DIAGNOSIS — R0982 Postnasal drip: Secondary | ICD-10-CM | POA: Diagnosis not present

## 2022-06-17 ENCOUNTER — Telehealth: Payer: Self-pay | Admitting: Cardiology

## 2022-06-17 NOTE — Telephone Encounter (Signed)
Pt c/o BP issue: STAT if pt c/o blurred vision, one-sided weakness or slurred speech  1. What are your last 5 BP readings?  This morning 181/85 Last night 141/81   174/88   185/89   185/91   151/85   145/83  2. Are you having any other symptoms (ex. Dizziness, headache, blurred vision, passed out)? She states she gets lightheaded a lot, she experiences the lightheadedness in the mornings.   3. What is your BP issue? BP has been high.  She also states she has been having cramping up under her left breast, not all the time. She had been having the cramping since she saw Dr. Katrinka Blazing back in December.

## 2022-06-17 NOTE — Progress Notes (Signed)
Cardiology Clinic Note   Patient Name: Brandy Medina Date of Encounter: 06/20/2022  Primary Care Provider:  Georgann Housekeeper, MD Primary Cardiologist:  Thomasene Ripple, DO  Patient Profile    Brandy Medina 72 year old female presents to the clinic today for follow-up evaluation of her essential hypertension.  Past Medical History    Past Medical History:  Diagnosis Date   Allergic rhinitis    Diabetes (HCC)    HTN (hypertension)    Migraines    PONV (postoperative nausea and vomiting)    Pulmonary fibrosis (HCC)    Past Surgical History:  Procedure Laterality Date   ABDOMINAL HYSTERECTOMY     BIOPSY  06/01/2020   Procedure: BIOPSY;  Surgeon: Kerin Salen, MD;  Location: [facility];  Service: Gastroenterology;;  EGD and COLON   BIOPSY  06/04/2021   Procedure: BIOPSY;  Surgeon: Kerin Salen, MD;  Location: [facility];  Service: Gastroenterology;;   BREAST SURGERY     COLONOSCOPY WITH PROPOFOL N/A 06/01/2020   Procedure: COLONOSCOPY WITH PROPOFOL;  Surgeon: Kerin Salen, MD;  Location: [facility];  Service: Gastroenterology;  Laterality: N/A;   ESOPHAGOGASTRODUODENOSCOPY N/A 06/04/2021   Procedure: ESOPHAGOGASTRODUODENOSCOPY (EGD);  Surgeon: Kerin Salen, MD;  Location: [facility];  Service: Gastroenterology;  Laterality: N/A;   ESOPHAGOGASTRODUODENOSCOPY (EGD) WITH PROPOFOL N/A 06/01/2020   Procedure: ESOPHAGOGASTRODUODENOSCOPY (EGD) WITH PROPOFOL;  Surgeon: Kerin Salen, MD;  Location: [facility];  Service: Gastroenterology;  Laterality: N/A;   POLYPECTOMY  06/01/2020   Procedure: POLYPECTOMY;  Surgeon: Kerin Salen, MD;  Location: [facility];  Service: Gastroenterology;;    Allergies  Allergies  Allergen Reactions   Avelox [Moxifloxacin Hcl In Nacl] Other (See Comments)    confusion   Avelox [Moxifloxacin] Other (See Comments)   Azathioprine Other (See Comments)   Codeine Nausea Only and Other (See Comments)   Erythromycin     GI upset   Fexofenadine Other (See Comments)    Ibuprofen Other (See Comments)   Levocetirizine Other (See Comments)   Lisinopril     Headache   Sulfa Antibiotics Other (See Comments)   Sulfonamide Derivatives     headache   Zyrtec [Cetirizine] Other (See Comments)    History of Present Illness    Brandy Medina has a PMH of HTN, iron deficiency anemia, diverticulosis, upper airway cough syndrome, chronic abdominal pain, von Willebrand's disease, chronic rhinitis, GERD, Sjogren's, ILD, and migraine headache.  She had a coronary calcium score 05/10/2021 which showed a coronary calcium score of 1.  This placed her in the 53rd percentile for age race and sex matched controls.  Her echocardiogram 2022 showed an LVEF of 65-70%, mild LVH, G1 DD, trivial mitral valve regurgitation and no significant changes from her echocardiogram that was taken in 2017.  She was seen in follow-up by Dr. Katrinka Blazing on 01/08/2022.  During that time she had no cardiac symptoms.  She denied orthopnea and PND.  She was tolerating her medication well.  She was noted to have mild atherosclerosis.  Aspirin and statin therapy were considered.  It was felt that she would need to clear her aspirin therapy with her other physicians as it was not a urgent medication recommendation.  She was to continue follow-up with cardiology.  She reported that she preferred to see a female.  Dr. Servando Salina was recommended.  She contacted the nurse triage line on 06/17/2022 and reported elevated blood pressure readings in the 140-180 systolic range.  She reported that she would get frequent episodes of  lightheadedness in the mornings.  She also reported cramping under her left breast that had been present intermittently since she was seen by Dr. Katrinka Blazing in December.  She presents to the clinic today for evaluation and states she has noticed fluctuations in her blood pressure.  These happened around back pain and headache which may be related to her comorbidities.  We discussed causes for hypertension.  She  was previously taking amlodipine 5 twice daily and noted lightheadedness.  She reports compliance with her medication and reports that overall her blood pressure is well-controlled.  She has been diagnosed with Sjogren's.  She follows with the cancer center and rheumatology.  She also reported some cramping type sensation under her left breast and through her trapezius.  I recommended that she take an extra half dose of amlodipine 2.5 mg for systolic blood pressure greater than 160 as needed, maintain hydration, continue low-sodium diet, maintain blood pressure log and we will plan follow-up in September as scheduled with Dr. Servando Salina.  Today she denies chest pain, shortness of breath, lower extremity edema, melena, hematuria, and hemoptysis.      Home Medications    Prior to Admission medications   Medication Sig Start Date End Date Taking? Authorizing Provider  acetaminophen (TYLENOL) 500 MG tablet Take 500-1,000 mg by mouth every 6 (six) hours as needed for moderate pain or headache.    [provider]  amLODipine (NORVASC) 5 MG tablet Take 5 mg by mouth 2 (two) times daily. 01/17/20   [provider]  butalbital-acetaminophen-caffeine (FIORICET) 50-325-40 MG tablet Take 1 tablet by mouth 2 (two) times daily as needed for migraine.    [provider]  chlorpheniramine (CHLOR-TRIMETON) 4 MG tablet Take 4 mg by mouth every 4 (four) hours as needed for allergies.    [provider]  Cholecalciferol (VITAMIN D) 50 MCG (2000 UT) tablet Take 2,000 Units by mouth 2 (two) times a week. Tues and Thurs    [provider]  dextromethorphan (DELSYM) 30 MG/5ML liquid Take 30 mg by mouth 2 (two) times daily as needed for cough.    [provider]  epoetin alfa (EPOGEN) 10000 UNIT/ML injection as directed Injection once every 3 weeks for 3 months    [provider]  Homeopathic Products (ARNICARE PAIN RELIEF EX) Apply 1 application. topically daily as  needed (pain).    [provider]  loratadine (CLARITIN) 10 MG tablet Take 10 mg by mouth daily as needed for allergies.    [provider]  Respiratory Therapy Supplies (FLUTTER) DEVI Use as directed. 06/29/19   Coral Ceo, NP  rosuvastatin (CRESTOR) 5 MG tablet Take 5 mg by mouth every Tuesday. 05/13/21   [provider]  valsartan (DIOVAN) 320 MG tablet Take 320 mg by mouth daily. 03/22/21   [provider]  Wheat Dextrin (BENEFIBER PO) Take 1 Scoop by mouth daily.    [provider]    Family History    Family History  Problem Relation Age of Onset   Renal Disease Brother    Stroke Brother    Bronchitis Mother    Heart disease Mother    Heart disease Father    Heart attack Father    Hypertension Sister    Diabetes Sister    Heart attack Brother    Hypertension Sister    Diabetes Sister    She indicated that her mother is deceased. She indicated that her father is deceased. She indicated that all of her  three sisters are alive. She indicated that both of her brothers are deceased.  Social History    Social History   Socioeconomic History   Marital status: Married    Spouse name: Not on file   Number of children: Not on file   Years of education: Not on file   Highest education level: Not on file  Occupational History   Not on file  Tobacco Use   Smoking status: Former    Packs/day: 0.50    Years: 18.00    Additional pack years: 0.00    Total pack years: 9.00    Types: Cigarettes    Quit date: 07/05/1993    Years since quitting: 28.9   Smokeless tobacco: Never  Substance and Sexual Activity   Alcohol use: Not on file   Drug use: Not on file   Sexual activity: Not on file  Other Topics Concern   Not on file  Social History Narrative   Not on file   Social Determinants of Health   Financial Resource Strain: Not on file  Food Insecurity: Not on file  Transportation Needs: Not on file  Physical Activity: Not on file   Stress: Not on file  Social Connections: Not on file  Intimate Partner Violence: Not on file     Review of Systems    General:  No chills, fever, night sweats or weight changes.  Cardiovascular:  No chest pain, dyspnea on exertion, edema, orthopnea, palpitations, paroxysmal nocturnal dyspnea. Dermatological: No rash, lesions/masses Respiratory: No cough, dyspnea Urologic: No hematuria, dysuria Abdominal:   No nausea, vomiting, diarrhea, bright red blood per rectum, melena, or hematemesis Neurologic:  No visual changes, wkns, changes in mental status. All other systems reviewed and are otherwise negative except as noted above.  Physical Exam    VS:  BP 132/80   Pulse 90   Ht 5\' 4"  (1.626 m)   Wt 159 lb 12.8 oz (72.5 kg)   SpO2 98%   BMI 27.43 kg/m  , BMI Body mass index is 27.43 kg/m. GEN: Well nourished, well developed, in no acute distress. HEENT: normal. Neck: Supple, no JVD, carotid bruits, or masses. Cardiac: RRR, no murmurs, rubs, or gallops. No clubbing, cyanosis, edema.  Radials/DP/PT 2+ and equal bilaterally.  Respiratory:  Respirations regular and unlabored, clear to auscultation bilaterally. GI: Soft, nontender, nondistended, BS + x 4. MS: no deformity or atrophy. Skin: warm and dry, no rash. Neuro:  Strength and sensation are intact. Psych: Normal affect.  Accessory Clinical Findings    Recent Labs: 04/28/2022: ALT 15; BUN 25; Creatinine 1.01; Hemoglobin 10.4; Platelet Count 248; Potassium 4.1; Sodium 135   Recent Lipid Panel No results found for: "CHOL", "TRIG", "HDL", "CHOLHDL", "VLDL", "LDLCALC", "LDLDIRECT"       ECG personally reviewed by me today-normal sinus rhythm right bundle branch block left anterior fascicular block LVH 90 bpm  Echocardiogram 2022  IMPRESSIONS   1. Left ventricular ejection fraction, by estimation, is 65 to 70%. Left  ventricular ejection fraction by 3D volume is 72 %. The left ventricle has  normal function. The left  ventricle has no regional wall motion  abnormalities. There is mild asymmetric  left ventricular hypertrophy of the basal-septal segment. Left ventricular  diastolic parameters are consistent with Grade I diastolic dysfunction  (impaired relaxation).   2. Right ventricular systolic function is normal. The right ventricular  size is normal. There is normal pulmonary artery systolic pressure. The  estimated right ventricular systolic pressure is 29.0  mmHg.   3. Left atrial size was mildly dilated.   4. The mitral valve is normal in structure. Trivial mitral valve  regurgitation.   5. The aortic valve is tricuspid. There is mild thickening of the aortic  valve. Aortic valve regurgitation is not visualized. No aortic stenosis is  present.   6. The inferior vena cava is normal in size with greater than 50%  respiratory variability, suggesting right atrial pressure of 3 mmHg.   Comparison(s): Compared to prior echo in 2017, there is no significant  change.      Assessment & Plan   1.  Essential hypertension-BP today 132/80.  Reports elevated blood pressures at home in the 140-180 systolic range. Maintain blood pressure log Low-sodium diet Increase physical activity as tolerated Continue valsartan  amlodipine to 5 mg daily-May take extra 2.5 mg sbp greater than 160 Avoid secondary causes of hypertension.   Bifascicular block, RBBB-denies recent episodes of lightheadedness, presyncope or syncope.  Heart rate today 90 bpm. Continue to monitor  Diastolic CHF-weight stable.  Staying physically active.  Echocardiogram 2022 showed hyperdynamic EF and G1 DD. Continue current medical therapy Heart healthy low-sodium diet Repeat echocardiogram when clinically indicated.  Hyperlipidemia-reports compliance with rosuvastatin. Follows with PCP  Disposition: Follow-up with Dr. Servando Salina in 9/24   Thomasene Ripple. Brandy Uhlir NP-C     06/20/2022, 9:16 AM [facility] [address] Office [phone] Fax [phone]    I spent 14 minutes examining this patient, reviewing medications, and using patient centered shared decision making involving her cardiac care.  Prior to her visit I spent greater than 20 minutes reviewing her past medical history,  medications, and prior cardiac tests.

## 2022-06-17 NOTE — Telephone Encounter (Signed)
Patient states BP running high since Sunday.  Had a cough due to history.  Saw ENT and was given a nasal spray but did not take due to BP concerns.She states inflammation to back and travels to head. Headache so went to ENT to ensure no sinus infection. She states has H/A several times a month due to migraines, but these are different She states this has been approx. A year.  Hard to pin down any information related to high BP. No answer to question, just long range of information.  Advised appt. And scheduled with NP for Friday

## 2022-06-20 ENCOUNTER — Encounter: Payer: Self-pay | Admitting: General Practice

## 2022-06-20 ENCOUNTER — Ambulatory Visit: Payer: Medicare PPO | Attending: General Practice | Admitting: General Practice

## 2022-06-20 VITALS — BP 132/80 | HR 90 | Ht 64.0 in | Wt 159.8 lb

## 2022-06-20 DIAGNOSIS — I10 Essential (primary) hypertension: Secondary | ICD-10-CM

## 2022-06-20 DIAGNOSIS — I4510 Unspecified right bundle-branch block: Secondary | ICD-10-CM | POA: Diagnosis not present

## 2022-06-20 DIAGNOSIS — I5032 Chronic diastolic (congestive) heart failure: Secondary | ICD-10-CM | POA: Diagnosis not present

## 2022-06-20 DIAGNOSIS — E782 Mixed hyperlipidemia: Secondary | ICD-10-CM

## 2022-06-20 MED ORDER — AMLODIPINE BESYLATE 5 MG PO TABS: 5.0000 mg | ORAL_TABLET | Freq: Every day | ORAL | 3 refills | Status: DC

## 2022-06-20 NOTE — Patient Instructions (Signed)
Medication Instructions:   Continue with current medications  If blood pressure is greater than systolic 160 /? You may take an extra 2.5 mg of Amlodipine   *If you need a refill on your cardiac medications before your next appointment, please call your pharmacy*   Lab Work:  notneeded   Testing/Procedures:  Not needed  Follow-Up: At [facility], you and your health needs are our priority.  As part of our continuing mission to provide you with exceptional heart care, we have created designated Provider Care Teams.  These Care Teams include your primary Cardiologist (physician) and Advanced Practice Providers (APPs -  Physician Assistants and Nurse Practitioners) who all work together to provide you with the care you need, when you need it.     Your next appointment:   Sept 9. 2024  The format for your next appointment:   In Person  Provider:   Thomasene Ripple, DO    Other Instruction  Keep Hydrated

## 2022-07-03 DIAGNOSIS — Z9189 Other specified personal risk factors, not elsewhere classified: Secondary | ICD-10-CM | POA: Diagnosis not present

## 2022-07-03 DIAGNOSIS — Z8619 Personal history of other infectious and parasitic diseases: Secondary | ICD-10-CM | POA: Diagnosis not present

## 2022-07-16 ENCOUNTER — Telehealth: Payer: Self-pay | Admitting: Hematology and Oncology

## 2022-07-16 NOTE — Telephone Encounter (Signed)
Rescheduled appointment per room/resource. Patient is aware of the changes made to her upcoming appointments.  

## 2022-07-27 NOTE — Progress Notes (Signed)
Patient Care Team: Georgann Housekeeper, MD as PCP - General (Internal Medicine) Thomasene Ripple, DO as PCP - Cardiology (Cardiology) Serena Croissant, MD as Consulting Physician (Hematology and Oncology)  DIAGNOSIS:  Encounter Diagnosis  Name Primary?   Normocytic anemia Yes    CHIEF COMPLIANT:  Follow-up of anemia of chronic disease/normocytic anemia    INTERVAL HISTORY: Brandy Medina is a 72 y.o. female with above-mentioned history of iron deficiency anemia and Von Willebrand's disease. She presents to the clinic for labs and follow-up. Pt reports no new issues to the clinic today.   ALLERGIES:  is allergic to avelox [moxifloxacin hcl in nacl], avelox [moxifloxacin], azathioprine, codeine, erythromycin, fexofenadine, ibuprofen, levocetirizine, lisinopril, sulfa antibiotics, sulfonamide derivatives, and zyrtec [cetirizine].  MEDICATIONS:  Current Outpatient Medications  Medication Sig Dispense Refill   acetaminophen (TYLENOL) 500 MG tablet Take 500-1,000 mg by mouth every 6 (six) hours as needed for moderate pain or headache.     amLODipine (NORVASC) 5 MG tablet Take 1 tablet (5 mg total) by mouth daily. May take an additional 2.5 mg tablet if systolic blood pressure 160 if needed. 135 tablet 3   butalbital-acetaminophen-caffeine (FIORICET) 50-325-40 MG tablet Take 1 tablet by mouth 2 (two) times daily as needed for migraine.     chlorpheniramine (CHLOR-TRIMETON) 4 MG tablet Take 4 mg by mouth every 4 (four) hours as needed for allergies.     Cholecalciferol (VITAMIN D) 50 MCG (2000 UT) tablet Take 2,000 Units by mouth 2 (two) times a week. Tues and Thurs     dextromethorphan (DELSYM) 30 MG/5ML liquid Take 30 mg by mouth 2 (two) times daily as needed for cough.     epoetin alfa (EPOGEN) 10000 UNIT/ML injection as directed Injection once every 3 weeks for 3 months     Homeopathic Products (ARNICARE PAIN RELIEF EX) Apply 1 application. topically daily as needed (pain).     loratadine (CLARITIN)  10 MG tablet Take 10 mg by mouth daily as needed for allergies.     Respiratory Therapy Supplies (FLUTTER) DEVI Use as directed. 1 each 0   rosuvastatin (CRESTOR) 5 MG tablet Take 5 mg by mouth every Tuesday.     valsartan (DIOVAN) 320 MG tablet Take 320 mg by mouth daily.     Wheat Dextrin (BENEFIBER PO) Take 1 Scoop by mouth daily.     Current Facility-Administered Medications  Medication Dose Route Frequency Provider Last Rate Last Admin   desmopressin (DDAVP) 20.4 mcg in sodium chloride 0.9 % 50 mL IVPB  0.3 mcg/kg Intravenous Once Edrick Kins, PA-C       desmopressin (DDAVP) 20.4 mcg in sodium chloride 0.9 % 50 mL IVPB  0.3 mcg/kg Intravenous Once Edrick Kins, PA-C        PHYSICAL EXAMINATION: ECOG PERFORMANCE STATUS: 1 - Symptomatic but completely ambulatory  Vitals:   08/04/22 0834  BP: (!) 159/86  Pulse: 87  Resp: 18  Temp: (!) 97.3 F (36.3 C)  SpO2: 98%   Filed Weights   08/04/22 0834  Weight: 162 lb (73.5 kg)      LABORATORY DATA:  I have reviewed the data as listed    Latest Ref Rng & Units 04/28/2022    8:16 AM 02/03/2022    8:30 AM 12/24/2021    8:17 AM  CMP  Glucose 70 - 99 mg/dL 782  96  956   BUN 8 - 23 mg/dL 25  19  24    Creatinine 0.44 - 1.00 mg/dL 2.13  0.86  0.97   Sodium 135 - 145 mmol/L 135  133  134   Potassium 3.5 - 5.1 mmol/L 4.1  4.1  4.4   Chloride 98 - 111 mmol/L 102  102  102   CO2 22 - 32 mmol/L 26  26  26    Calcium 8.9 - 10.3 mg/dL 82.9  9.8  9.7   Total Protein 6.5 - 8.1 g/dL 9.9  9.0  9.9   Total Bilirubin 0.3 - 1.2 mg/dL 0.5  0.4  0.4   Alkaline Phos 38 - 126 U/L 58  63  66   AST 15 - 41 U/L 21  22  22    ALT 0 - 44 U/L 15  16  16      Lab Results  Component Value Date   WBC 3.3 (L) 08/04/2022   HGB 10.1 (L) 08/04/2022   HCT 30.5 (L) 08/04/2022   MCV 96.5 08/04/2022   PLT 242 08/04/2022   NEUTROABS 2.0 08/04/2022    ASSESSMENT & PLAN:  Normocytic anemia 04/17/2020: Hemoglobin 7.9, MCV 92.2, WBC 4.9,  platelets 88, erythropoietin 38.9, ferritin 2445, iron saturation 33%, LDH 196, SPEP: Polyclonal rise in immunoglobulins, no M protein, AST 55, ALT 66, alkaline phosphatase 496, creatinine 1 07/06/2020: WBC 3.1, hemoglobin 9.4, MCV 99, platelets 254, ANC 1.7, ESR 41, CRP 1, total protein 9.3, globulins 5.2, rest of the LFTs are normal 10/07/2021: Hemoglobin 10.4 04/28/2022: Hemoglobin 10.4 (this is without Retacrit injections) 08/04/2022: Hemoglobin 10.1, WBC 3.3   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 Pylori   Indication for bone marrow biopsy: If ANC drops below 1 and development of other cytopenias   Current treatment: Retacrit 40,000 units every 3 weeks started 08/05/2021, last injection on 11/05/2021 We discontinued Retacrit injection   Leukopenia: ANC is 2.  Therefore there is no role for bone marrow biopsy at this time. She is following with rheumatology. I will see her back in 6 months with labs and follow-up.    Orders Placed This Encounter  Procedures   CBC with Differential (Cancer Center Only)    Standing Status:   Future    Standing Expiration Date:   08/04/2023   Ferritin    Standing Status:   Future    Standing Expiration Date:   08/04/2023   Iron and Iron Binding Capacity ([facility] only)    Standing Status:   Future    Standing Expiration Date:   08/04/2023   CMP (Cancer Center only)    Standing Status:   Future    Standing Expiration Date:   08/04/2023   The patient has a good understanding of the overall plan. she agrees with it. she will call with any problems that may develop before the next visit here. Total time spent: 30 mins including face to face time and time spent for planning, charting and co-ordination of care   Tamsen Meek, MD 08/04/22    I Janan Ridge am acting as a Neurosurgeon for [employer]  I have reviewed the above documentation for accuracy and  completeness, and I agree with the above.

## 2022-08-04 ENCOUNTER — Inpatient Hospital Stay: Payer: Medicare PPO

## 2022-08-04 ENCOUNTER — Inpatient Hospital Stay: Payer: Medicare PPO | Attending: Hematology and Oncology

## 2022-08-04 ENCOUNTER — Inpatient Hospital Stay: Payer: Medicare PPO | Admitting: Hematology and Oncology

## 2022-08-04 ENCOUNTER — Other Ambulatory Visit: Payer: Self-pay

## 2022-08-04 VITALS — BP 159/86 | HR 87 | Temp 97.3°F | Resp 18 | Ht 64.0 in | Wt 162.0 lb

## 2022-08-04 DIAGNOSIS — D649 Anemia, unspecified: Secondary | ICD-10-CM | POA: Insufficient documentation

## 2022-08-04 DIAGNOSIS — D638 Anemia in other chronic diseases classified elsewhere: Secondary | ICD-10-CM | POA: Diagnosis not present

## 2022-08-04 DIAGNOSIS — D6800 Von Willebrand disease, unspecified: Secondary | ICD-10-CM | POA: Diagnosis not present

## 2022-08-04 LAB — CBC WITH DIFFERENTIAL (CANCER CENTER ONLY)
Abs Immature Granulocytes: 0.01 10*3/uL (ref 0.00–0.07)
Basophils Absolute: 0 10*3/uL (ref 0.0–0.1)
Basophils Relative: 1 %
Eosinophils Absolute: 0 10*3/uL (ref 0.0–0.5)
Eosinophils Relative: 1 %
HCT: 30.5 % — ABNORMAL LOW (ref 36.0–46.0)
Hemoglobin: 10.1 g/dL — ABNORMAL LOW (ref 12.0–15.0)
Immature Granulocytes: 0 %
Lymphocytes Relative: 24 %
Lymphs Abs: 0.8 10*3/uL (ref 0.7–4.0)
MCH: 32 pg (ref 26.0–34.0)
MCHC: 33.1 g/dL (ref 30.0–36.0)
MCV: 96.5 fL (ref 80.0–100.0)
Monocytes Absolute: 0.4 10*3/uL (ref 0.1–1.0)
Monocytes Relative: 13 %
Neutro Abs: 2 10*3/uL (ref 1.7–7.7)
Neutrophils Relative %: 61 %
Platelet Count: 242 10*3/uL (ref 150–400)
RBC: 3.16 MIL/uL — ABNORMAL LOW (ref 3.87–5.11)
RDW: 14.7 % (ref 11.5–15.5)
WBC Count: 3.3 10*3/uL — ABNORMAL LOW (ref 4.0–10.5)
nRBC: 0 % (ref 0.0–0.2)

## 2022-08-04 LAB — CMP (CANCER CENTER ONLY)
ALT: 16 U/L (ref 0–44)
AST: 22 U/L (ref 15–41)
Albumin: 3.7 g/dL (ref 3.5–5.0)
Alkaline Phosphatase: 55 U/L (ref 38–126)
Anion gap: 5 (ref 5–15)
BUN: 22 mg/dL (ref 8–23)
CO2: 28 mmol/L (ref 22–32)
Calcium: 10.1 mg/dL (ref 8.9–10.3)
Chloride: 100 mmol/L (ref 98–111)
Creatinine: 1.03 mg/dL — ABNORMAL HIGH (ref 0.44–1.00)
GFR, Estimated: 58 mL/min — ABNORMAL LOW (ref >60)
Glucose, Bld: 85 mg/dL (ref 70–99)
Potassium: 4.4 mmol/L (ref 3.5–5.1)
Sodium: 133 mmol/L — ABNORMAL LOW (ref 135–145)
Total Bilirubin: 0.5 mg/dL (ref 0.3–1.2)
Total Protein: 9.8 g/dL — ABNORMAL HIGH (ref 6.5–8.1)

## 2022-08-04 LAB — IRON AND IRON BINDING CAPACITY (CC-WL,HP ONLY)
Iron: 90 ug/dL (ref 28–170)
Saturation Ratios: 32 % — ABNORMAL HIGH (ref 10.4–31.8)
TIBC: 281 ug/dL (ref 250–450)
UIBC: 191 ug/dL (ref 148–442)

## 2022-08-04 LAB — FERRITIN: Ferritin: 340 ng/mL — ABNORMAL HIGH (ref 11–307)

## 2022-08-04 NOTE — Assessment & Plan Note (Signed)
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 Pylori   Indication for bone marrow biopsy: If ANC drops below 1 and development of other cytopenias   Current treatment: Retacrit 40,000 units every 3 weeks started 08/05/2021, last injection on 11/05/2021 We discontinued Retacrit injection   Leukopenia: ANC is 1.9.  Therefore there is no role for bone marrow biopsy at this time. I will see her back in 3 months with labs and follow-up.

## 2022-08-06 DIAGNOSIS — I130 Hypertensive heart and chronic kidney disease with heart failure and stage 1 through stage 4 chronic kidney disease, or unspecified chronic kidney disease: Secondary | ICD-10-CM | POA: Diagnosis not present

## 2022-08-06 DIAGNOSIS — I700 Atherosclerosis of aorta: Secondary | ICD-10-CM | POA: Diagnosis not present

## 2022-08-06 DIAGNOSIS — D6800 Von Willebrand disease, unspecified: Secondary | ICD-10-CM | POA: Diagnosis not present

## 2022-08-06 DIAGNOSIS — I5032 Chronic diastolic (congestive) heart failure: Secondary | ICD-10-CM | POA: Diagnosis not present

## 2022-08-06 DIAGNOSIS — E1136 Type 2 diabetes mellitus with diabetic cataract: Secondary | ICD-10-CM | POA: Diagnosis not present

## 2022-08-06 DIAGNOSIS — E871 Hypo-osmolality and hyponatremia: Secondary | ICD-10-CM | POA: Diagnosis not present

## 2022-08-06 DIAGNOSIS — E1122 Type 2 diabetes mellitus with diabetic chronic kidney disease: Secondary | ICD-10-CM | POA: Diagnosis not present

## 2022-08-06 DIAGNOSIS — M3500 Sicca syndrome, unspecified: Secondary | ICD-10-CM | POA: Diagnosis not present

## 2022-08-06 DIAGNOSIS — J479 Bronchiectasis, uncomplicated: Secondary | ICD-10-CM | POA: Diagnosis not present

## 2022-08-28 ENCOUNTER — Encounter: Payer: Self-pay | Admitting: Neurology

## 2022-08-28 DIAGNOSIS — E663 Overweight: Secondary | ICD-10-CM | POA: Diagnosis not present

## 2022-08-28 DIAGNOSIS — D638 Anemia in other chronic diseases classified elsewhere: Secondary | ICD-10-CM | POA: Diagnosis not present

## 2022-08-28 DIAGNOSIS — D890 Polyclonal hypergammaglobulinemia: Secondary | ICD-10-CM | POA: Diagnosis not present

## 2022-08-28 DIAGNOSIS — M5134 Other intervertebral disc degeneration, thoracic region: Secondary | ICD-10-CM | POA: Diagnosis not present

## 2022-08-28 DIAGNOSIS — Z6827 Body mass index (BMI) 27.0-27.9, adult: Secondary | ICD-10-CM | POA: Diagnosis not present

## 2022-08-28 DIAGNOSIS — G5793 Unspecified mononeuropathy of bilateral lower limbs: Secondary | ICD-10-CM | POA: Diagnosis not present

## 2022-08-28 DIAGNOSIS — M3501 Sicca syndrome with keratoconjunctivitis: Secondary | ICD-10-CM | POA: Diagnosis not present

## 2022-08-28 DIAGNOSIS — M542 Cervicalgia: Secondary | ICD-10-CM | POA: Diagnosis not present

## 2022-08-28 DIAGNOSIS — J849 Interstitial pulmonary disease, unspecified: Secondary | ICD-10-CM | POA: Diagnosis not present

## 2022-09-16 ENCOUNTER — Encounter: Payer: Self-pay | Admitting: Neurology

## 2022-09-16 ENCOUNTER — Ambulatory Visit: Payer: Medicare PPO | Admitting: Neurology

## 2022-09-16 VITALS — BP 130/92 | HR 81 | Ht 64.0 in | Wt 163.0 lb

## 2022-09-16 DIAGNOSIS — R202 Paresthesia of skin: Secondary | ICD-10-CM | POA: Diagnosis not present

## 2022-09-16 NOTE — Progress Notes (Signed)
[facility]rology [facility] Clinic Note - Initial Visit   Date: 09/16/2022   [name] MRN: [id number] DOB: 12-Nov-1950   Dear Dr. Dierdre Forth:  Thank you for your kind referral of Brandy Medina for consultation of numbness/tingling. Although her history is well known to you, please allow Brandy Medina to reiterate it for the purpose of our medical record. The patient was accompanied to the clinic by self.    [name] is a 72 y.o. right-handed female with Sjogren's syndrome, idiopathic pulmonary fibrosis, hypertension, hyperlipidemia, and diet-controlled diabetes presenting for evaluation of numbness/tingling of the hands and feet.   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  Neurological exam is only notable for absent Achilles reflexes.  Distal strength and sensation is preserved.  We discussed the role of NCS/EMG and also mentioned that if this is normal, additional skin biopsy for small fiber neuropathy could be performed.  Possible carpal tunnel syndrome given that she has improvement with wearing wrist brace  - NCS/EMG of the right arm and leg - Discussed that medications only alleviate pain symptoms and do not reverse neuropathy; fortunately, she denies having painful paresthesias  Return to clinic in 6 months  ------------------------------------------------------------- History of present illness: Starting around summer 2023, she began having sensation as if she is stepping on stones/sticks involving the balls of the feet and toes.  There is no associated pain.  Symptoms are slightly worse on the right.  She also complains of intermittent numbness of the fingertips. Symptoms are improved if she wears a wrist brace.  This occurs infrequently, possibly once every few months.  Repositioning and stretching helps alleviate it.  She denies weakness of the arms and legs.  Prior smoker.  Socially drinks alcohol.  She lives at home  with husband.  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  Diabetes is diet controlled.  Out-side paper records, electronic medical record, and images have been reviewed where available and summarized as:  Lab Results  Component Value Date   TSH 1.95 03/27/2008   Lab Results  Component Value Date   ESRSEDRATE 117 (H) 07/06/2014    Past Medical History:  Diagnosis Date   Allergic rhinitis    Diabetes (HCC)    HTN (hypertension)    Migraines    PONV (postoperative nausea and vomiting)    Pulmonary fibrosis (HCC)     Past Surgical History:  Procedure Laterality Date   ABDOMINAL HYSTERECTOMY     BIOPSY  06/01/2020   Procedure: BIOPSY;  Surgeon: Kerin Salen, MD;  Location: [facility];  Service: Gastroenterology;;  EGD and COLON   BIOPSY  06/04/2021   Procedure: BIOPSY;  Surgeon: Kerin Salen, MD;  Location: [facility];  Service: Gastroenterology;;   BREAST SURGERY     COLONOSCOPY WITH PROPOFOL N/A 06/01/2020   Procedure: COLONOSCOPY WITH PROPOFOL;  Surgeon: Kerin Salen, MD;  Location: [facility];  Service: Gastroenterology;  Laterality: N/A;   ESOPHAGOGASTRODUODENOSCOPY N/A 06/04/2021   Procedure: ESOPHAGOGASTRODUODENOSCOPY (EGD);  Surgeon: Kerin Salen, MD;  Location: [facility];  Service: Gastroenterology;  Laterality: N/A;   ESOPHAGOGASTRODUODENOSCOPY (EGD) WITH PROPOFOL N/A 06/01/2020   Procedure: ESOPHAGOGASTRODUODENOSCOPY (EGD) WITH PROPOFOL;  Surgeon: Kerin Salen, MD;  Location: [facility];  Service: Gastroenterology;  Laterality: N/A;   POLYPECTOMY  06/01/2020   Procedure: POLYPECTOMY;  Surgeon: Kerin Salen, MD;  Location: [facility];  Service: Gastroenterology;;     Medications:  Outpatient Encounter Medications as of 09/16/2022  Medication Sig   acetaminophen (TYLENOL) 500 MG tablet  Take 500-1,000 mg by mouth every 6 (six) hours as needed for moderate pain or headache.   amLODipine (NORVASC) 5 MG tablet Take 1 tablet (5 mg total) by mouth daily. May take an additional 2.5 mg tablet if  systolic blood pressure 160 if needed.   butalbital-acetaminophen-caffeine (FIORICET) 50-325-40 MG tablet Take 1 tablet by mouth 2 (two) times daily as needed for migraine.   chlorpheniramine (CHLOR-TRIMETON) 4 MG tablet Take 4 mg by mouth every 4 (four) hours as needed for allergies.   Cholecalciferol (VITAMIN D) 50 MCG (2000 UT) tablet Take 2,000 Units by mouth 2 (two) times a week. Tues and Thurs   dextromethorphan (DELSYM) 30 MG/5ML liquid Take 30 mg by mouth 2 (two) times daily as needed for cough.   epoetin alfa (EPOGEN) 10000 UNIT/ML injection as directed Injection once every 3 weeks for 3 months   Homeopathic Products (ARNICARE PAIN RELIEF EX) Apply 1 application. topically daily as needed (pain).   loratadine (CLARITIN) 10 MG tablet Take 10 mg by mouth daily as needed for allergies.   Respiratory Therapy Supplies (FLUTTER) DEVI Use as directed.   rosuvastatin (CRESTOR) 5 MG tablet Take 5 mg by mouth every Tuesday.   valsartan (DIOVAN) 320 MG tablet Take 320 mg by mouth daily.   Wheat Dextrin (BENEFIBER PO) Take 1 Scoop by mouth daily.   Facility-Administered Encounter Medications as of 09/16/2022  Medication   desmopressin (DDAVP) 20.4 mcg in sodium chloride 0.9 % 50 mL IVPB   desmopressin (DDAVP) 20.4 mcg in sodium chloride 0.9 % 50 mL IVPB    Allergies:  Allergies  Allergen Reactions   Avelox [Moxifloxacin Hcl In Nacl] Other (See Comments)    confusion   Avelox [Moxifloxacin] Other (See Comments)   Azathioprine Other (See Comments)   Codeine Nausea Only and Other (See Comments)   Erythromycin     GI upset   Fexofenadine Other (See Comments)   Levocetirizine Other (See Comments)   Lisinopril     Headache   Sulfa Antibiotics Other (See Comments)   Sulfonamide Derivatives     headache   Zyrtec [Cetirizine] Other (See Comments)    Family History: Family History  Problem Relation Age of Onset   Renal Disease Brother    Stroke Brother    Bronchitis Mother    Heart  disease Mother    Heart disease Father    Heart attack Father    Hypertension Sister    Diabetes Sister    Heart attack Brother    Hypertension Sister    Diabetes Sister     Social History: Social History   Tobacco Use   Smoking status: Former    Current packs/day: 0.00    Average packs/day: 0.5 packs/day for 18.0 years (9.0 ttl pk-yrs)    Types: Cigarettes    Start date: 07/06/1975    Quit date: 07/05/1993    Years since quitting: 29.2   Smokeless tobacco: Never   Social History   Social History Narrative   Not on file    Vital Signs:  BP (!) 146/90   Pulse 81   Ht 5\' 4"  (1.626 m)   Wt 163 lb (73.9 kg)   SpO2 98%   BMI 27.98 kg/m   Neurological Exam: MENTAL STATUS including orientation to time, place, person, recent and remote memory, attention span and concentration, language, and fund of knowledge is normal.  Speech is not dysarthric.  CRANIAL NERVES: II:  No visual field defects.     III-IV-VI: Pupils  equal round and reactive to light.  Normal conjugate, extra-ocular eye movements in all directions of gaze.  No nystagmus.  No ptosis.   V:  Normal facial sensation.    VII:  Normal facial symmetry and movements.   VIII:  Normal hearing and vestibular function.   IX-X:  Normal palatal movement.   XI:  Normal shoulder shrug and head rotation.   XII:  Normal tongue strength and range of motion, no deviation or fasciculation.  MOTOR:  No atrophy, fasciculations or abnormal movements.  No pronator drift.   Upper Extremity:  Right  Left  Deltoid  5/5   5/5   Biceps  5/5   5/5   Triceps  5/5   5/5   Wrist extensors  5/5   5/5   Wrist flexors  5/5   5/5   Finger extensors  5/5   5/5   Finger flexors  5/5   5/5   Dorsal interossei  5/5   5/5   Abductor pollicis  5/5   5/5   Tone (Ashworth scale)  0  0   Lower Extremity:  Right  Left  Hip flexors  5/5   5/5   Knee flexors  5/5   5/5   Knee extensors  5/5   5/5   Dorsiflexors  5/5   5/5   Plantarflexors  5/5    5/5   Toe extensors  5/5   5/5   Toe flexors  5/5   5/5   Tone (Ashworth scale)  0  0   MSRs:                                           Right        Left brachioradialis 2+  2+  biceps 2+  2+  triceps 2+  2+  patellar 2+  2+  ankle jerk 0  0  Hoffman no  no  plantar response down  down   SENSORY:  Normal and symmetric perception of light touch, pinprick, vibration, and temperature.  Romberg's sign absent.   COORDINATION/GAIT: Normal finger-to- nose-finger.  Intact rapid alternating movements bilaterally.  Able to rise from a chair without using arms.  Gait narrow based and stable. Tandem and stressed gait intact.     Thank you for allowing me to participate in patient's care.  If I can answer any additional questions, I would be pleased to do so.    Sincerely,    Michaela Broski K. Allena Katz, DO

## 2022-09-16 NOTE — Patient Instructions (Signed)
Nerve testing of the right arm and leg  ELECTROMYOGRAM AND NERVE CONDUCTION STUDIES (EMG/NCS) INSTRUCTIONS  How to Prepare The neurologist conducting the EMG will need to know if you have certain medical conditions. Tell the neurologist and other EMG lab personnel if you: Have a pacemaker or any other electrical medical device Take blood-thinning medications Have hemophilia, a blood-clotting disorder that causes prolonged bleeding Bathing Take a shower or bath shortly before your exam in order to remove oils from your skin. Don't apply lotions or creams before the exam.  What to Expect You'll likely be asked to change into a hospital gown for the procedure and lie down on an examination table. The following explanations can help you understand what will happen during the exam.  Electrodes. The neurologist or a technician places surface electrodes at various locations on your skin depending on where you're experiencing symptoms. Or the neurologist may insert needle electrodes at different sites depending on your symptoms.  Sensations. The electrodes will at times transmit a tiny electrical current that you may feel as a twinge or spasm. The needle electrode may cause discomfort or pain that usually ends shortly after the needle is removed. If you are concerned about discomfort or pain, you may want to talk to the neurologist about taking a short break during the exam.  Instructions. During the needle EMG, the neurologist will assess whether there is any spontaneous electrical activity when the muscle is at rest - activity that isn't present in healthy muscle tissue - and the degree of activity when you slightly contract the muscle.  He or she will give you instructions on resting and contracting a muscle at appropriate times. Depending on what muscles and nerves the neurologist is examining, he or she may ask you to change positions during the exam.  After your EMG You may experience some  temporary, minor bruising where the needle electrode was inserted into your muscle. This bruising should fade within several days. If it persists, contact your primary care doctor.

## 2022-10-06 ENCOUNTER — Ambulatory Visit: Payer: Medicare PPO | Attending: Cardiology | Admitting: Cardiology

## 2022-10-06 ENCOUNTER — Encounter: Payer: Self-pay | Admitting: Cardiology

## 2022-10-06 VITALS — BP 138/88 | HR 87 | Ht 64.0 in | Wt 163.2 lb

## 2022-10-06 DIAGNOSIS — I10 Essential (primary) hypertension: Secondary | ICD-10-CM | POA: Diagnosis not present

## 2022-10-06 DIAGNOSIS — Z7689 Persons encountering health services in other specified circumstances: Secondary | ICD-10-CM

## 2022-10-06 DIAGNOSIS — R0609 Other forms of dyspnea: Secondary | ICD-10-CM | POA: Diagnosis not present

## 2022-10-06 NOTE — Progress Notes (Unsigned)
Cardiology Office Note:    Date:  10/06/2022   ID:  Brandy Medina, DOB Jun 16, 1950, MRN [id number]  PCP:  Georgann Housekeeper, MD  Cardiologist:  Thomasene Ripple, DO  Electrophysiologist:  None   Referring MD: Georgann Housekeeper, MD   No chief complaint on file. ***  History of Present Illness:    Brandy Medina is a 72 y.o. female with a hx of hypertension, iron deficiency anemia, stroke and syndrome, ILD, coronary calcification seen on coronary CT calcium scoring, echocardiogram in 2022 showed normal EF mild LVH grade 1 diastolic dysfunction.  She followed with Dr. Katrinka Blazing and has transition to to his retirement.  Her last visit in our office was with Edd Fabian, NP this was on May 2024 at that time she reported some elevated blood pressure  Past Medical History:  Diagnosis Date   Allergic rhinitis    Diabetes (HCC)    HTN (hypertension)    Migraines    PONV (postoperative nausea and vomiting)    Pulmonary fibrosis (HCC)     Past Surgical History:  Procedure Laterality Date   ABDOMINAL HYSTERECTOMY     BIOPSY  06/01/2020   Procedure: BIOPSY;  Surgeon: Kerin Salen, MD;  Location: [facility];  Service: Gastroenterology;;  EGD and COLON   BIOPSY  06/04/2021   Procedure: BIOPSY;  Surgeon: Kerin Salen, MD;  Location: [facility];  Service: Gastroenterology;;   BREAST SURGERY     COLONOSCOPY WITH PROPOFOL N/A 06/01/2020   Procedure: COLONOSCOPY WITH PROPOFOL;  Surgeon: Kerin Salen, MD;  Location: [facility];  Service: Gastroenterology;  Laterality: N/A;   ESOPHAGOGASTRODUODENOSCOPY N/A 06/04/2021   Procedure: ESOPHAGOGASTRODUODENOSCOPY (EGD);  Surgeon: Kerin Salen, MD;  Location: [facility];  Service: Gastroenterology;  Laterality: N/A;   ESOPHAGOGASTRODUODENOSCOPY (EGD) WITH PROPOFOL N/A 06/01/2020   Procedure: ESOPHAGOGASTRODUODENOSCOPY (EGD) WITH PROPOFOL;  Surgeon: Kerin Salen, MD;  Location: [facility];  Service: Gastroenterology;  Laterality: N/A;   POLYPECTOMY  06/01/2020   Procedure:  POLYPECTOMY;  Surgeon: Kerin Salen, MD;  Location: [facility];  Service: Gastroenterology;;    Current Medications: Current Meds  Medication Sig   acetaminophen (TYLENOL) 500 MG tablet Take 500-1,000 mg by mouth every 6 (six) hours as needed for moderate pain or headache.   amLODipine (NORVASC) 5 MG tablet Take 1 tablet (5 mg total) by mouth daily. May take an additional 2.5 mg tablet if systolic blood pressure 160 if needed.   butalbital-acetaminophen-caffeine (FIORICET) 50-325-40 MG tablet Take 1 tablet by mouth 2 (two) times daily as needed for migraine.   chlorpheniramine (CHLOR-TRIMETON) 4 MG tablet Take 4 mg by mouth every 4 (four) hours as needed for allergies.   Cholecalciferol (VITAMIN D) 50 MCG (2000 UT) tablet Take 1,000 Units by mouth daily. Tues and Thurs   dextromethorphan (DELSYM) 30 MG/5ML liquid Take 30 mg by mouth 2 (two) times daily as needed for cough.   epoetin alfa (EPOGEN) 10000 UNIT/ML injection as directed Injection once every 3 weeks for 3 months   Homeopathic Products (ARNICARE PAIN RELIEF EX) Apply 1 application. topically daily as needed (pain).   loratadine (CLARITIN) 10 MG tablet Take 10 mg by mouth daily as needed for allergies.   Respiratory Therapy Supplies (FLUTTER) DEVI Use as directed.   rosuvastatin (CRESTOR) 5 MG tablet Take 5 mg by mouth every Tuesday.   valsartan (DIOVAN) 320 MG tablet Take 320 mg by mouth daily.   Wheat Dextrin (BENEFIBER PO) Take 1 Scoop by mouth daily.   Current Facility-Administered Medications for the  10/06/22 encounter (Office Visit) with Thomasene Ripple, DO  Medication   desmopressin (DDAVP) 20.4 mcg in sodium chloride 0.9 % 50 mL IVPB   desmopressin (DDAVP) 20.4 mcg in sodium chloride 0.9 % 50 mL IVPB     Allergies:   Avelox [moxifloxacin hcl in nacl], Avelox [moxifloxacin], Azathioprine, Codeine, Erythromycin, Fexofenadine, Levocetirizine, Lisinopril, Sulfa antibiotics, Sulfonamide derivatives, and Zyrtec [cetirizine]    Social History   Socioeconomic History   Marital status: Married    Spouse name: Not on file   Number of children: Not on file   Years of education: Not on file   Highest education level: Not on file  Occupational History   Not on file  Tobacco Use   Smoking status: Former    Current packs/day: 0.00    Average packs/day: 0.5 packs/day for 18.0 years (9.0 ttl pk-yrs)    Types: Cigarettes    Start date: 07/06/1975    Quit date: 07/05/1993    Years since quitting: 29.2   Smokeless tobacco: Never  Substance and Sexual Activity   Alcohol use: Yes    Comment: Occasional Wine or Martini   Drug use: Never   Sexual activity: Not on file  Other Topics Concern   Not on file  Social History Narrative   Are you right handed or left handed? Right ended   Are you currently employed ? No   What is your current occupation? Retired   Do you live at home alone? No    Who lives with you?  Lives with husband    What type of home do you live in: 1 story or 2 story? Lives in a two story home.        Social Determinants of Health   Financial Resource Strain: Not on file  Food Insecurity: Not on file  Transportation Needs: Not on file  Physical Activity: Not on file  Stress: Not on file  Social Connections: Not on file     Family History: The patient's family history includes Bronchitis in her mother; Diabetes in her sister and sister; Heart attack in her brother and father; Heart disease in her father and mother; Hypertension in her sister and sister; Renal Disease in her brother; Stroke in her brother.  ROS:   Review of Systems  Constitution: Negative for decreased appetite, fever and weight gain.  HENT: Negative for congestion, ear discharge, hoarse voice and sore throat.   Eyes: Negative for discharge, redness, vision loss in right eye and visual halos.  Cardiovascular: Negative for chest pain, dyspnea on exertion, leg swelling, orthopnea and palpitations.  Respiratory: Negative for  cough, hemoptysis, shortness of breath and snoring.   Endocrine: Negative for heat intolerance and polyphagia.  Hematologic/Lymphatic: Negative for bleeding problem. Does not bruise/bleed easily.  Skin: Negative for flushing, nail changes, rash and suspicious lesions.  Musculoskeletal: Negative for arthritis, joint pain, muscle cramps, myalgias, neck pain and stiffness.  Gastrointestinal: Negative for abdominal pain, bowel incontinence, diarrhea and excessive appetite.  Genitourinary: Negative for decreased libido, genital sores and incomplete emptying.  Neurological: Negative for brief paralysis, focal weakness, headaches and loss of balance.  Psychiatric/Behavioral: Negative for altered mental status, depression and suicidal ideas.  Allergic/Immunologic: Negative for HIV exposure and persistent infections.    EKGs/Labs/Other Studies Reviewed:    The following studies were reviewed today:   EKG:  The ekg ordered today demonstrates   Recent Labs: 08/04/2022: ALT 16; BUN 22; Creatinine 1.03; Hemoglobin 10.1; Platelet Count 242; Potassium 4.4; Sodium 133  Recent Lipid Panel No results found for: "CHOL", "TRIG", "HDL", "CHOLHDL", "VLDL", "LDLCALC", "LDLDIRECT"  Physical Exam:    VS:  BP 138/88   Pulse 87   Ht 5\' 4"  (1.626 m)   Wt 163 lb 3.2 oz (74 kg)   SpO2 100%   BMI 28.01 kg/m     Wt Readings from Last 3 Encounters:  10/06/22 163 lb 3.2 oz (74 kg)  09/16/22 163 lb (73.9 kg)  08/04/22 162 lb (73.5 kg)     GEN: Well nourished, well developed in no acute distress HEENT: Normal NECK: No JVD; No carotid bruits LYMPHATICS: No lymphadenopathy CARDIAC: S1S2 noted,RRR, no murmurs, rubs, gallops RESPIRATORY:  Clear to auscultation without rales, wheezing or rhonchi  ABDOMEN: Soft, non-tender, non-distended, +bowel sounds, no guarding. EXTREMITIES: No edema, No cyanosis, no clubbing MUSCULOSKELETAL:  No deformity  SKIN: Warm and dry NEUROLOGIC:  Alert and oriented x 3,  non-focal PSYCHIATRIC:  Normal affect, good insight  ASSESSMENT:    No diagnosis found. PLAN:     1.  The patient is in agreement with the above plan. The patient left the office in stable condition.  The patient will follow up in   Medication Adjustments/Labs and Tests Ordered: Current medicines are reviewed at length with the patient today.  Concerns regarding medicines are outlined above.  No orders of the defined types were placed in this encounter.  No orders of the defined types were placed in this encounter.   There are no Patient Instructions on file for this visit.   Adopting a Healthy Lifestyle.  Know what a healthy weight is for you (roughly BMI <25) and aim to maintain this   Aim for 7+ servings of fruits and vegetables daily   65-80+ fluid ounces of water or unsweet tea for healthy kidneys   Limit to max 1 drink of alcohol per day; avoid smoking/tobacco   Limit animal fats in diet for cholesterol and heart health - choose grass fed whenever available   Avoid highly processed foods, and foods high in saturated/trans fats   Aim for low stress - take time to unwind and care for your mental health   Aim for 150 min of moderate intensity exercise weekly for heart health, and weights twice weekly for bone health   Aim for 7-9 hours of sleep daily   When it comes to diets, agreement about the perfect plan isnt easy to find, even among the experts. Experts at the [facility] developed an idea known as the Healthy Eating Plate. Just imagine a plate divided into logical, healthy portions.   The emphasis is on diet quality:   Load up on vegetables and fruits - one-half of your plate: Aim for color and variety, and remember that potatoes dont count.   Go for whole grains - one-quarter of your plate: Whole wheat, barley, wheat berries, quinoa, oats, brown rice, and foods made with them. If you want pasta, go with whole wheat pasta.   Protein  power - one-quarter of your plate: Fish, chicken, beans, and nuts are all healthy, versatile protein sources. Limit red meat.   The diet, however, does go beyond the plate, offering a few other suggestions.   Use healthy plant oils, such as olive, canola, soy, corn, sunflower and peanut. Check the labels, and avoid partially hydrogenated oil, which have unhealthy trans fats.   If youre thirsty, drink water. Coffee and tea are good in moderation, but skip sugary drinks and limit milk  and dairy products to one or two daily servings.   The type of carbohydrate in the diet is more important than the amount. Some sources of carbohydrates, such as vegetables, fruits, whole grains, and beans-are healthier than others.   Finally, stay active  Signed, Thomasene Ripple, DO  10/06/2022 8:23 AM    [facility]

## 2022-10-06 NOTE — Patient Instructions (Addendum)
Medication Instructions:  Your physician recommends that you continue on your current medications as directed. Please refer to the Current Medication list given to you today.  *If you need a refill on your cardiac medications before your next appointment, please call your pharmacy*   Lab Work: None   Testing/Procedures: Your physician has requested that you have a stress echocardiogram. For further information please visit [email or web address] Please follow instruction sheet as given.      Stress Echocardiogram Information Sheet                                                      Instructions:    1. You may take your morning medications the morning of the test  2. Light breakfast no caffeine  3. Dress prepared to exercise.  4. DO NOT use ANY caffeine or tobacco products 3 hours before appointment.  5. Please bring all current prescription medications.    Follow-Up: At [facility], you and your health needs are our priority.  As part of our continuing mission to provide you with exceptional heart care, we have created designated Provider Care Teams.  These Care Teams include your primary Cardiologist (physician) and Advanced Practice Providers (APPs -  Physician Assistants and Nurse Practitioners) who all work together to provide you with the care you need, when you need it.  Your next appointment:   4 month(s)  Provider:   Thomasene Ripple, DO

## 2022-10-09 ENCOUNTER — Ambulatory Visit: Payer: Medicare PPO | Admitting: Neurology

## 2022-10-09 DIAGNOSIS — G5603 Carpal tunnel syndrome, bilateral upper limbs: Secondary | ICD-10-CM

## 2022-10-09 DIAGNOSIS — M5417 Radiculopathy, lumbosacral region: Secondary | ICD-10-CM

## 2022-10-09 DIAGNOSIS — R202 Paresthesia of skin: Secondary | ICD-10-CM | POA: Diagnosis not present

## 2022-10-09 NOTE — Procedures (Signed)
[facility] Neurology  [address] [place], [address]  [place], [place] [address] Tel: [phone] Fax: [phone] Test Date:  10/09/2022  Patient: Brandy Medina DOB: 10-08-1950 Physician: Nita Sickle, DO  Sex: Female Height: 5\' 4"  Ref Phys: Nita Sickle, DO  ID#: [id number]   Technician:    History: This is a 72 year old female referred for evaluation of bilateral hand and feet paresthesias.  NCV & EMG Findings: Extensive electrodiagnostic testing of the right upper, right lower, and additional studies of the left upper extremity shows:  Bilateral median sensory responses show prolonged latency (R4.7, L4.3 ms).  Right ulnar, sural, and superficial peroneal sensory responses are within normal limits. Right median motor response shows prolonged latency (R4.8 ms).  Left median, right ulnar, and right peroneal motor responses are within normal limits.  Right tibial motor response shows reduced amplitude (R2.4 mV).  Of note, there is evidence of bilateral Martin-Gruber anastomoses, a normal anatomic variant. Right tibial H reflex study is within normal limits. In the right upper extremity, there is no evidence of active or chronic motor axonal loss changes affecting any of the tested muscles. In the right lower extremity, chronic motor axon loss changes are seen affecting the S1 myotome, without accompanying active denervation.  Impression: Bilateral median neuropathy at or distal to the wrist, consistent with a clinical diagnosis of carpal tunnel syndrome.  Overall, these findings are moderate on the right, and mild on the left. Chronic S1 radiculopathy affecting the right lower extremity, mild. There is no evidence of a large fiber sensorimotor polyneuropathy affecting the right side.   ___________________________ Nita Sickle, DO    Nerve Conduction Studies   Stim Site NR Peak (ms) Norm Peak (ms) O-P Amp (V) Norm O-P Amp  Left Median Anti Sensory (2nd Digit)  32 C  Wrist    *4.3  <3.8 17.7 >10  Right Median Anti Sensory (2nd Digit)  32 C  Wrist    *4.7 <3.8 19.3 >10  Right Sup Peroneal Anti Sensory (Ant Lat Mall)  32 C  12 cm    2.3 <4.6 9.8 >3  Right Sural Anti Sensory (Lat Mall)  32 C  Calf    3.2 <4.6 7.9 >3  Right Ulnar Anti Sensory (5th Digit)  32 C  Wrist    3.2 <3.2 17.3 >5     Stim Site NR Onset (ms) Norm Onset (ms) O-P Amp (mV) Norm O-P Amp Site1 Site2 Delta-0 (ms) Dist (cm) Vel (m/s) Norm Vel (m/s)  Left Median Motor (Abd Poll Brev)  32 C  Wrist    4.0 <4.0 5.5 >5 Elbow Wrist 5.8 0.0  >50  Elbow    9.8  5.0  Axilla Elbow 4.3 0.0    Ulnar-wrist crossover    5.5  2.3         Right Median Motor (Abd Poll Brev)  32 C  Wrist    *4.8 <4.0 5.4 >5 Elbow Wrist 5.4 30.0 56 >50  Elbow    10.2  4.9  Ulnar-wrist crossover Elbow 5.2 0.0    Ulnar-wrist crossover    5.0  4.5         Right Peroneal Motor (Ext Dig Brev)  32 C  Ankle    3.0 <6.0 4.6 >2.5 B Fib Ankle 8.2 39.0 48 >40  B Fib    11.2  4.3  Poplt B Fib 1.6 9.0 56 >40  Poplt    12.8  4.1         Right  Tibial Motor (Abd Hall Brev)  32 C  Ankle    3.8 <6.0 *2.4 >4 Knee Ankle 8.7 40.0 46 >40  Knee    12.5  2.4         Right Ulnar Motor (Abd Dig Minimi)  32 C  Wrist    2.6 <3.1 8.4 >7 B Elbow Wrist 4.5 23.0 51 >50  B Elbow    7.1  7.3  A Elbow B Elbow 1.7 10.0 59 >50  A Elbow    8.8  7.2          Electromyography   Side Muscle Ins.Act Fibs Fasc Recrt Amp Dur Poly Activation Comment  Right 1stDorInt Nml Nml Nml Nml Nml Nml Nml Nml N/A  Right Abd Poll Brev Nml Nml Nml Nml Nml Nml Nml Nml N/A  Right PronatorTeres Nml Nml Nml Nml Nml Nml Nml Nml N/A  Right Biceps Nml Nml Nml Nml Nml Nml Nml Nml N/A  Right Triceps Nml Nml Nml Nml Nml Nml Nml Nml N/A  Right Deltoid Nml Nml Nml Nml Nml Nml Nml Nml N/A  Right AntTibialis Nml Nml Nml Nml Nml Nml Nml Nml N/A  Right Gastroc Nml Nml Nml *1- *1+ *1+ *1+ Nml N/A  Right Flex Dig Long Nml Nml Nml Nml Nml Nml Nml Nml N/A  Right RectFemoris Nml Nml Nml Nml  Nml Nml Nml Nml N/A  Right BicepsFemS Nml Nml Nml *1- *1+ *1+ *1+ Nml N/A  Right GluteusMed Nml Nml Nml Nml Nml Nml Nml Nml N/A      Waveforms:

## 2022-10-09 NOTE — Progress Notes (Signed)
Follow-up Visit   Date: 10/09/2022    Brandy Medina MRN: [id number] DOB: April 18, 1950    Brandy Medina is a 72 y.o. right-handed female with  Sjogren's syndrome, idiopathic pulmonary fibrosis, hypertension, hyperlipidemia, and diet-controlled diabetes returning to the clinic for follow-up of bilateral hand and feet numbness/tingling.  The patient was accompanied to the clinic by self.    IMPRESSION/PLAN: Bilateral carpal tunnel syndrome, worse on the right   - Continue to use wrist splints nightly  - Consider evaluation for surgery, if symptoms get worse Bilateral feet paresthesias, possibly due to S1 radiculopathy. NCS does not show evidence of neuropathy.  - Start PT for low back strengthening - Consider MRI lumbar spine and/or skin biopsy if symptoms progress  Return to clinic, if no improvement  --------------------------------------------- History of present illness: Starting around summer 2023, she began having sensation as if she is stepping on stones/sticks involving the balls of the feet and toes.  There is no associated pain.  Symptoms are slightly worse on the right.  She also complains of intermittent numbness of the fingertips. Symptoms are improved if she wears a wrist brace.  This occurs infrequently, possibly once every few months.  Repositioning and stretching helps alleviate it.  She denies weakness of the arms and legs.  Prior smoker.  Socially drinks alcohol.  She lives at home with husband.  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  Diabetes is diet controlled.   UPDATE 10/09/2022:  She is here for EDX of the hands and right leg.  She continues to have abnormal sensation over the feet and numbness in the hands.  Numbness is improved when she wear a brace.  No new complaints.   Medications:  Current Outpatient Medications on File Prior to Visit  Medication Sig Dispense Refill   acetaminophen (TYLENOL) 500 MG tablet Take 500-1,000 mg by mouth every 6 (six) hours as needed for  moderate pain or headache.     amLODipine (NORVASC) 5 MG tablet Take 1 tablet (5 mg total) by mouth daily. May take an additional 2.5 mg tablet if systolic blood pressure 160 if needed. 135 tablet 3   butalbital-acetaminophen-caffeine (FIORICET) 50-325-40 MG tablet Take 1 tablet by mouth 2 (two) times daily as needed for migraine.     chlorpheniramine (CHLOR-TRIMETON) 4 MG tablet Take 4 mg by mouth every 4 (four) hours as needed for allergies.     Cholecalciferol (VITAMIN D) 50 MCG (2000 UT) tablet Take 1,000 Units by mouth daily. Tues and Thurs     dextromethorphan (DELSYM) 30 MG/5ML liquid Take 30 mg by mouth 2 (two) times daily as needed for cough.     epoetin alfa (EPOGEN) 10000 UNIT/ML injection as directed Injection once every 3 weeks for 3 months     Homeopathic Products (ARNICARE PAIN RELIEF EX) Apply 1 application. topically daily as needed (pain).     loratadine (CLARITIN) 10 MG tablet Take 10 mg by mouth daily as needed for allergies.     Respiratory Therapy Supplies (FLUTTER) DEVI Use as directed. 1 each 0   rosuvastatin (CRESTOR) 5 MG tablet Take 5 mg by mouth every Tuesday.     valsartan (DIOVAN) 320 MG tablet Take 320 mg by mouth daily.     Wheat Dextrin (BENEFIBER PO) Take 1 Scoop by mouth daily.     Current Facility-Administered Medications on File Prior to Visit  Medication Dose Route Frequency Provider Last Rate Last Admin   desmopressin (DDAVP) 20.4 mcg in sodium chloride 0.9 %  50 mL IVPB  0.3 mcg/kg Intravenous Once Edrick Kins, PA-C       desmopressin (DDAVP) 20.4 mcg in sodium chloride 0.9 % 50 mL IVPB  0.3 mcg/kg Intravenous Once Edrick Kins, PA-C        Allergies:  Allergies  Allergen Reactions   Avelox [Moxifloxacin Hcl In Nacl] Other (See Comments)    confusion   Avelox [Moxifloxacin] Other (See Comments)   Azathioprine Other (See Comments)   Codeine Nausea Only and Other (See Comments)   Erythromycin     GI upset   Fexofenadine Other (See  Comments)   Levocetirizine Other (See Comments)   Lisinopril     Headache   Sulfa Antibiotics Other (See Comments)   Sulfonamide Derivatives     headache   Zyrtec [Cetirizine] Other (See Comments)    Vital Signs:  There were no vitals taken for this visit.   General Medical Exam:   Exam deferred    Data: NCS/EMG of the right side and left arm 10/09/2022: Bilateral median neuropathy at or distal to the wrist, consistent with a clinical diagnosis of carpal tunnel syndrome.  Overall, these findings are moderate on the right, and mild on the left. Chronic S1 radiculopathy affecting the right lower extremity, mild. There is no evidence of a large fiber sensorimotor polyneuropathy affecting the right side.  Thank you for allowing me to participate in patient's care.  If I can answer any additional questions, I would be pleased to do so.    Sincerely,    Ashle Stief K. Allena Katz, DO

## 2022-10-10 ENCOUNTER — Other Ambulatory Visit: Payer: Self-pay

## 2022-10-10 DIAGNOSIS — M5416 Radiculopathy, lumbar region: Secondary | ICD-10-CM

## 2022-10-10 DIAGNOSIS — Z1231 Encounter for screening mammogram for malignant neoplasm of breast: Secondary | ICD-10-CM | POA: Diagnosis not present

## 2022-10-10 DIAGNOSIS — M5417 Radiculopathy, lumbosacral region: Secondary | ICD-10-CM

## 2022-10-10 DIAGNOSIS — R202 Paresthesia of skin: Secondary | ICD-10-CM

## 2022-10-10 LAB — HM MAMMOGRAPHY

## 2022-10-16 DIAGNOSIS — M79672 Pain in left foot: Secondary | ICD-10-CM | POA: Diagnosis not present

## 2022-10-16 DIAGNOSIS — M79604 Pain in right leg: Secondary | ICD-10-CM | POA: Diagnosis not present

## 2022-10-16 DIAGNOSIS — R202 Paresthesia of skin: Secondary | ICD-10-CM | POA: Diagnosis not present

## 2022-10-16 DIAGNOSIS — M5459 Other low back pain: Secondary | ICD-10-CM | POA: Diagnosis not present

## 2022-10-16 DIAGNOSIS — M542 Cervicalgia: Secondary | ICD-10-CM | POA: Diagnosis not present

## 2022-10-20 DIAGNOSIS — M542 Cervicalgia: Secondary | ICD-10-CM | POA: Diagnosis not present

## 2022-10-20 DIAGNOSIS — M79672 Pain in left foot: Secondary | ICD-10-CM | POA: Diagnosis not present

## 2022-10-20 DIAGNOSIS — M5459 Other low back pain: Secondary | ICD-10-CM | POA: Diagnosis not present

## 2022-10-20 DIAGNOSIS — R202 Paresthesia of skin: Secondary | ICD-10-CM | POA: Diagnosis not present

## 2022-10-20 DIAGNOSIS — M79604 Pain in right leg: Secondary | ICD-10-CM | POA: Diagnosis not present

## 2022-10-23 ENCOUNTER — Other Ambulatory Visit (HOSPITAL_COMMUNITY): Payer: Medicare PPO

## 2022-10-23 ENCOUNTER — Telehealth (HOSPITAL_COMMUNITY): Payer: Self-pay

## 2022-10-23 NOTE — Telephone Encounter (Signed)
Detailed instructions left on the patient's answering machine. Asked to call back with any questions. S.Aby Gessel CCT

## 2022-10-28 ENCOUNTER — Ambulatory Visit (HOSPITAL_COMMUNITY): Payer: Medicare PPO | Attending: Cardiology

## 2022-10-28 ENCOUNTER — Ambulatory Visit (HOSPITAL_COMMUNITY): Payer: Medicare PPO

## 2022-10-28 DIAGNOSIS — R0609 Other forms of dyspnea: Secondary | ICD-10-CM | POA: Insufficient documentation

## 2022-10-28 LAB — ECHOCARDIOGRAM STRESS TEST
Area-P 1/2: 3.93 cm2
S' Lateral: 2.8 cm

## 2022-10-28 MED ORDER — PERFLUTREN LIPID MICROSPHERE
1.0000 mL | INTRAVENOUS | Status: AC | PRN
  Administered 2022-10-28 (×2): 2 mL via INTRAVENOUS
  Administered 2022-10-28: 1 mL via INTRAVENOUS

## 2022-11-03 DIAGNOSIS — M79604 Pain in right leg: Secondary | ICD-10-CM | POA: Diagnosis not present

## 2022-11-03 DIAGNOSIS — R202 Paresthesia of skin: Secondary | ICD-10-CM | POA: Diagnosis not present

## 2022-11-03 DIAGNOSIS — M542 Cervicalgia: Secondary | ICD-10-CM | POA: Diagnosis not present

## 2022-11-03 DIAGNOSIS — M79672 Pain in left foot: Secondary | ICD-10-CM | POA: Diagnosis not present

## 2022-11-03 DIAGNOSIS — M5459 Other low back pain: Secondary | ICD-10-CM | POA: Diagnosis not present

## 2022-11-07 DIAGNOSIS — M79672 Pain in left foot: Secondary | ICD-10-CM | POA: Insufficient documentation

## 2022-11-07 DIAGNOSIS — M722 Plantar fascial fibromatosis: Secondary | ICD-10-CM | POA: Diagnosis not present

## 2022-11-14 ENCOUNTER — Encounter: Payer: Self-pay | Admitting: Pulmonary Disease

## 2022-11-17 DIAGNOSIS — M5134 Other intervertebral disc degeneration, thoracic region: Secondary | ICD-10-CM | POA: Diagnosis not present

## 2022-11-17 DIAGNOSIS — M3501 Sicca syndrome with keratoconjunctivitis: Secondary | ICD-10-CM | POA: Diagnosis not present

## 2022-11-17 DIAGNOSIS — E663 Overweight: Secondary | ICD-10-CM | POA: Diagnosis not present

## 2022-11-17 DIAGNOSIS — M25511 Pain in right shoulder: Secondary | ICD-10-CM | POA: Diagnosis not present

## 2022-11-17 DIAGNOSIS — Z6827 Body mass index (BMI) 27.0-27.9, adult: Secondary | ICD-10-CM | POA: Diagnosis not present

## 2022-12-09 ENCOUNTER — Other Ambulatory Visit: Payer: Medicare PPO

## 2022-12-29 ENCOUNTER — Ambulatory Visit
Admission: RE | Admit: 2022-12-29 | Discharge: 2022-12-29 | Disposition: A | Discharge status: Home / Self Care | Payer: Medicare PPO | Source: Ambulatory Visit | Attending: Pulmonary Disease | Admitting: Pulmonary Disease

## 2022-12-29 DIAGNOSIS — J849 Interstitial pulmonary disease, unspecified: Secondary | ICD-10-CM

## 2022-12-29 DIAGNOSIS — J8410 Pulmonary fibrosis, unspecified: Secondary | ICD-10-CM | POA: Diagnosis not present

## 2022-12-29 DIAGNOSIS — J439 Emphysema, unspecified: Secondary | ICD-10-CM | POA: Diagnosis not present

## 2023-01-12 DIAGNOSIS — H524 Presbyopia: Secondary | ICD-10-CM | POA: Diagnosis not present

## 2023-01-12 DIAGNOSIS — E119 Type 2 diabetes mellitus without complications: Secondary | ICD-10-CM | POA: Diagnosis not present

## 2023-01-12 DIAGNOSIS — M3500 Sicca syndrome, unspecified: Secondary | ICD-10-CM | POA: Diagnosis not present

## 2023-01-12 DIAGNOSIS — H25813 Combined forms of age-related cataract, bilateral: Secondary | ICD-10-CM | POA: Diagnosis not present

## 2023-01-12 LAB — HM DIABETES EYE EXAM

## 2023-02-03 ENCOUNTER — Encounter: Payer: Self-pay | Admitting: Internal Medicine

## 2023-02-03 ENCOUNTER — Ambulatory Visit: Payer: Medicare PPO | Admitting: Internal Medicine

## 2023-02-03 VITALS — BP 142/100 | HR 90 | Temp 97.9°F | Ht 64.0 in | Wt 163.0 lb

## 2023-02-03 DIAGNOSIS — I110 Hypertensive heart disease with heart failure: Secondary | ICD-10-CM

## 2023-02-03 DIAGNOSIS — I700 Atherosclerosis of aorta: Secondary | ICD-10-CM

## 2023-02-03 DIAGNOSIS — E2839 Other primary ovarian failure: Secondary | ICD-10-CM | POA: Diagnosis not present

## 2023-02-03 DIAGNOSIS — Z79899 Other long term (current) drug therapy: Secondary | ICD-10-CM | POA: Diagnosis not present

## 2023-02-03 DIAGNOSIS — Z87898 Personal history of other specified conditions: Secondary | ICD-10-CM

## 2023-02-03 DIAGNOSIS — R202 Paresthesia of skin: Secondary | ICD-10-CM | POA: Diagnosis not present

## 2023-02-03 DIAGNOSIS — J8410 Pulmonary fibrosis, unspecified: Secondary | ICD-10-CM | POA: Diagnosis not present

## 2023-02-03 DIAGNOSIS — Z7689 Persons encountering health services in other specified circumstances: Secondary | ICD-10-CM

## 2023-02-03 DIAGNOSIS — I5032 Chronic diastolic (congestive) heart failure: Secondary | ICD-10-CM | POA: Diagnosis not present

## 2023-02-03 MED ORDER — ROSUVASTATIN CALCIUM 5 MG PO TABS: 5.0000 mg | ORAL_TABLET | ORAL | 3 refills | Status: DC

## 2023-02-03 MED ORDER — VALSARTAN 320 MG PO TABS: 320.0000 mg | ORAL_TABLET | Freq: Every day | ORAL | 2 refills | Status: DC

## 2023-02-03 NOTE — Progress Notes (Signed)
 I,[name], CMA,acting as a neurosurgeon for [name], MD.,have documented all relevant documentation on the behalf of [name], MD,as directed by  [name], MD while in the presence of [name], MD.  Subjective:  Patient ID: Brandy Medina , female    DOB: [birth year] , 73 y.o.   MRN: [id number]  Chief Complaint  Patient presents with   Establish Care    HPI  Patient presents today to establish care. Previous pcp: Ardell Manly.  She was referred by Dr. Sheena. She reports history of high blood pressure, Sjogren's syndrome and pulmonary fibrosis.  She is now followed by Cardiology and Pulmonary. She states she has been prescribed amlodipine  & valsartan  for HTN. She takes amlodipine  only when her bp seems to be elevated. She takes valsartan  once daily.  She denies having any palpitations, chest pain & sob.   Letter sent to [facility] for most recent mammogram. She is followed by Dr. Rosalva for GYN exams.  Dr. Theophilus is her current pulmonologist.   She is a retired chartered loss adjuster, previously worked at [employer] and retired from the [employer].  She is originally from [place] .  She is married, 48 years. She has 2 children.      Hypertension This is a chronic problem. The problem has been gradually improving since onset. Pertinent negatives include no blurred vision, chest pain, palpitations or shortness of breath. Past treatments include calcium  channel blockers and angiotensin blockers. The current treatment provides moderate improvement.     Past Medical History:  Diagnosis Date   Allergic rhinitis    Diabetes (HCC)    HTN (hypertension)    Migraines    PONV (postoperative nausea and vomiting)    Pulmonary fibrosis (HCC)      Family History  Problem Relation Age of Onset   Renal Disease Brother    Stroke Brother    Bronchitis Mother    Heart disease Mother    Heart disease Father    Heart attack Father    Hypertension Sister    Diabetes  Sister    Heart attack Brother    Hypertension Sister    Diabetes Sister      Current Outpatient Medications:    acetaminophen  (TYLENOL ) 500 MG tablet, Take 500-1,000 mg by mouth every 6 (six) hours as needed for moderate pain or headache., Disp: , Rfl:    butalbital -acetaminophen -caffeine  (FIORICET) 50-325-40 MG tablet, Take 1 tablet by mouth 2 (two) times daily as needed for migraine., Disp: , Rfl:    Cholecalciferol (VITAMIN D) 50 MCG (2000 UT) tablet, Take 1,000 Units by mouth daily. Tues and Thurs, Disp: , Rfl:    dextromethorphan (DELSYM) 30 MG/5ML liquid, Take 30 mg by mouth 2 (two) times daily as needed for cough., Disp: , Rfl:    epoetin  alfa (EPOGEN ) 10000 UNIT/ML injection, as directed Injection once every 3 weeks for 3 months, Disp: , Rfl:    Homeopathic Products (ARNICARE PAIN RELIEF EX), Apply 1 application. topically daily as needed (pain)., Disp: , Rfl:    loratadine (CLARITIN) 10 MG tablet, Take 10 mg by mouth daily as needed for allergies., Disp: , Rfl:    Respiratory Therapy Supplies (FLUTTER) DEVI, Use as directed., Disp: 1 each, Rfl: 0   Wheat Dextrin (BENEFIBER PO), Take 1 Scoop by mouth daily., Disp: , Rfl:    amLODipine  (NORVASC ) 5 MG tablet, Take 1 tablet (5 mg total) by mouth daily. May take an additional 2.5 mg tablet if  systolic blood pressure 160 if needed., Disp: 135 tablet, Rfl: 3   chlorpheniramine (CHLOR-TRIMETON) 4 MG tablet, Take 4 mg by mouth every 4 (four) hours as needed for allergies., Disp: , Rfl:    empagliflozin  (JARDIANCE ) 10 MG TABS tablet, Take 1 tablet (10 mg total) by mouth daily before breakfast., Disp: , Rfl:    gabapentin  (NEURONTIN ) 100 MG capsule, Take 1 capsule (100 mg total) by mouth daily., Disp: 30 capsule, Rfl: 2   rosuvastatin  (CRESTOR ) 5 MG tablet, Take 1 tablet (5 mg total) by mouth every Tuesday., Disp: 12 tablet, Rfl: 3   valsartan  (DIOVAN ) 320 MG tablet, Take 1 tablet (320 mg total) by mouth daily., Disp: 90 tablet, Rfl:  2  Current Facility-Administered Medications:    desmopressin  (DDAVP ) 20.4 mcg in sodium chloride  0.9 % 50 mL IVPB, 0.3 mcg/kg, Intravenous, Once, Baron-Johnson, Alison, PA-C   desmopressin  (DDAVP ) 20.4 mcg in sodium chloride  0.9 % 50 mL IVPB, 0.3 mcg/kg, Intravenous, Once, Baron-Johnson, Alison, PA-C   Allergies  Allergen Reactions   Avelox [Moxifloxacin Hcl In Nacl] Other (See Comments)    confusion   Avelox [Moxifloxacin] Other (See Comments)   Azathioprine  Other (See Comments)   Codeine Nausea Only and Other (See Comments)   Erythromycin     GI upset   Fexofenadine Other (See Comments)   Levocetirizine Other (See Comments)   Lisinopril     Headache   Sulfa Antibiotics Other (See Comments)   Sulfonamide Derivatives     headache   Zyrtec [Cetirizine] Other (See Comments)     Review of Systems  Constitutional: Negative.   Eyes:  Negative for blurred vision.  Respiratory: Negative.  Negative for shortness of breath.   Cardiovascular: Negative.  Negative for chest pain and palpitations.  Gastrointestinal: Negative.   Neurological:  Positive for numbness.       She adds feeling like she walks on pebbles all the time. She is established with Neuro, no neuropathy noted.  Psychiatric/Behavioral: Negative.       Today's Vitals   02/03/23 1416 02/03/23 1522  BP: (!) 140/100 (!) 142/100  Pulse: 90   Temp: 97.9 F (36.6 C)   SpO2: 98%   Weight: 163 lb (73.9 kg)   Height: 5' 4 (1.626 m)    Body mass index is 27.98 kg/m.  Wt Readings from Last 3 Encounters:  02/10/23 163 lb (73.9 kg)  02/06/23 163 lb 3.2 oz (74 kg)  02/05/23 161 lb 7 oz (73.2 kg)    BP Readings from Last 3 Encounters:  02/10/23 122/76  02/06/23 120/74  02/05/23 129/88     Objective:  Physical Exam Vitals and nursing note reviewed.  Constitutional:      Appearance: Normal appearance.  HENT:     Head: Normocephalic and atraumatic.  Eyes:     Extraocular Movements: Extraocular movements intact.   Cardiovascular:     Rate and Rhythm: Normal rate and regular rhythm.     Heart sounds: Normal heart sounds.  Pulmonary:     Effort: Pulmonary effort is normal.     Breath sounds: Normal breath sounds.  Musculoskeletal:     Cervical back: Normal range of motion.  Skin:    General: Skin is warm.  Neurological:     General: No focal deficit present.     Mental Status: She is alert.  Psychiatric:        Mood and Affect: Mood normal.        Behavior: Behavior normal.  Assessment And Plan:  Hypertensive heart disease with chronic diastolic congestive heart failure (HCC) Assessment & Plan: Chronic uncontrolled.  EKG performed, NSR w/ frequent PACs, RBBB with left axis -bifascicular block, voltage criteria for LVH.  She is currently taking valsartan  320mg  at 12 noon.  She is advised to take amlodipine  5mg  1/2 tab daily. She agrees to rto in 2 weeks for NV.   Orders: -     EKG 12-Lead -     CMP14+EGFR -     Lipid panel  Aortic atherosclerosis (HCC) Assessment & Plan: Chronic, LDL goal is less than 70.  She is encouraged to follow heart healthy lifestyle. Clean eating, regular exercise and stress management are all a part of this lifestyle. She is also encouraged to continue with statin therapy.     Paresthesia of lower extremity -     TSH  Estrogen deficiency Assessment & Plan: I will refer her to [facility] for bone density. She is encouraged to engage in weight-bearing exercises at least 2-3 days per week.    Pulmonary fibrosis (HCC) Assessment & Plan: Chronic, now followed by Pulmonary. She has declined anti-thrombotic therapy. Of note, she is also followed by Rheum, Dr. Mai for Sjogren's sndrome.    Encounter to establish care with new doctor  History of prediabetes -     Hemoglobin A1c  Drug therapy -     Vitamin B12  Other orders -     Valsartan ; Take 1 tablet (320 mg total) by mouth daily.  Dispense: 90 tablet; Refill: 2 -     Rosuvastatin  Calcium ; Take  1 tablet (5 mg total) by mouth every Tuesday.  Dispense: 12 tablet; Refill: 3     Return in 6 months (on 08/03/2023), or physical examination.  Patient was given opportunity to ask questions. Patient verbalized understanding of the plan and was able to repeat key elements of the plan. All questions were answered to their satisfaction.    I, [name], MD, have reviewed all documentation for this visit. The documentation on 02/03/23 for the exam, diagnosis, procedures, and orders are all accurate and complete.   IF YOU HAVE BEEN REFERRED TO A SPECIALIST, IT MAY TAKE 1-2 WEEKS TO SCHEDULE/PROCESS THE REFERRAL. IF YOU HAVE NOT HEARD FROM US /SPECIALIST IN TWO WEEKS, PLEASE GIVE US  A CALL AT [phone].   THE PATIENT IS ENCOURAGED TO PRACTICE SOCIAL DISTANCING DUE TO THE COVID-19 PANDEMIC.

## 2023-02-03 NOTE — Patient Instructions (Signed)

## 2023-02-04 ENCOUNTER — Encounter: Payer: Self-pay | Admitting: Internal Medicine

## 2023-02-04 LAB — CMP14+EGFR
ALT: 16 [IU]/L (ref 0–32)
AST: 25 [IU]/L (ref 0–40)
Albumin: 4.1 g/dL (ref 3.8–4.8)
Alkaline Phosphatase: 70 [IU]/L (ref 44–121)
BUN/Creatinine Ratio: 17 (ref 12–28)
BUN: 17 mg/dL (ref 8–27)
Bilirubin Total: 0.5 mg/dL (ref 0.0–1.2)
CO2: 19 mmol/L — ABNORMAL LOW (ref 20–29)
Calcium: 9.6 mg/dL (ref 8.7–10.3)
Chloride: 98 mmol/L (ref 96–106)
Creatinine, Ser: 1.01 mg/dL — ABNORMAL HIGH (ref 0.57–1.00)
Globulin, Total: 5.3 g/dL — ABNORMAL HIGH (ref 1.5–4.5)
Glucose: 93 mg/dL (ref 70–99)
Potassium: 4.4 mmol/L (ref 3.5–5.2)
Sodium: 134 mmol/L (ref 134–144)
Total Protein: 9.4 g/dL — ABNORMAL HIGH (ref 6.0–8.5)
eGFR: 59 mL/min/{1.73_m2} — ABNORMAL LOW (ref >59)

## 2023-02-04 LAB — LIPID PANEL
Chol/HDL Ratio: 2.1 {ratio} (ref 0.0–4.4)
Cholesterol, Total: 194 mg/dL (ref 100–199)
HDL: 94 mg/dL (ref >39)
LDL Chol Calc (NIH): 87 mg/dL (ref 0–99)
Triglycerides: 69 mg/dL (ref 0–149)
VLDL Cholesterol Cal: 13 mg/dL (ref 5–40)

## 2023-02-04 LAB — HEMOGLOBIN A1C
Est. average glucose Bld gHb Est-mCnc: 140 mg/dL
Hgb A1c MFr Bld: 6.5 % — ABNORMAL HIGH (ref 4.8–5.6)

## 2023-02-04 LAB — VITAMIN B12: Vitamin B-12: 1275 pg/mL — ABNORMAL HIGH (ref 232–1245)

## 2023-02-04 LAB — TSH: TSH: 1.88 u[IU]/mL (ref 0.450–4.500)

## 2023-02-05 ENCOUNTER — Inpatient Hospital Stay: Payer: Medicare PPO | Admitting: Hematology and Oncology

## 2023-02-05 ENCOUNTER — Inpatient Hospital Stay: Payer: Medicare PPO | Attending: Hematology and Oncology

## 2023-02-05 ENCOUNTER — Encounter: Payer: Self-pay | Admitting: Internal Medicine

## 2023-02-05 VITALS — BP 129/88 | HR 87 | Temp 98.0°F | Resp 17 | Ht 64.0 in | Wt 161.4 lb

## 2023-02-05 DIAGNOSIS — E1122 Type 2 diabetes mellitus with diabetic chronic kidney disease: Secondary | ICD-10-CM | POA: Diagnosis not present

## 2023-02-05 DIAGNOSIS — E1142 Type 2 diabetes mellitus with diabetic polyneuropathy: Secondary | ICD-10-CM | POA: Insufficient documentation

## 2023-02-05 DIAGNOSIS — Z79899 Other long term (current) drug therapy: Secondary | ICD-10-CM | POA: Insufficient documentation

## 2023-02-05 DIAGNOSIS — R7989 Other specified abnormal findings of blood chemistry: Secondary | ICD-10-CM | POA: Insufficient documentation

## 2023-02-05 DIAGNOSIS — K2970 Gastritis, unspecified, without bleeding: Secondary | ICD-10-CM | POA: Insufficient documentation

## 2023-02-05 DIAGNOSIS — D72819 Decreased white blood cell count, unspecified: Secondary | ICD-10-CM | POA: Diagnosis not present

## 2023-02-05 DIAGNOSIS — R5383 Other fatigue: Secondary | ICD-10-CM | POA: Insufficient documentation

## 2023-02-05 DIAGNOSIS — B9681 Helicobacter pylori [H. pylori] as the cause of diseases classified elsewhere: Secondary | ICD-10-CM | POA: Insufficient documentation

## 2023-02-05 DIAGNOSIS — D649 Anemia, unspecified: Secondary | ICD-10-CM | POA: Diagnosis not present

## 2023-02-05 LAB — IRON AND IRON BINDING CAPACITY (CC-WL,HP ONLY)
Iron: 93 ug/dL (ref 28–170)
Saturation Ratios: 32 % — ABNORMAL HIGH (ref 10.4–31.8)
TIBC: 288 ug/dL (ref 250–450)
UIBC: 195 ug/dL (ref 148–442)

## 2023-02-05 LAB — CBC WITH DIFFERENTIAL (CANCER CENTER ONLY)
Abs Immature Granulocytes: 0.01 10*3/uL (ref 0.00–0.07)
Basophils Absolute: 0 10*3/uL (ref 0.0–0.1)
Basophils Relative: 1 %
Eosinophils Absolute: 0.1 10*3/uL (ref 0.0–0.5)
Eosinophils Relative: 2 %
HCT: 29.5 % — ABNORMAL LOW (ref 36.0–46.0)
Hemoglobin: 9.7 g/dL — ABNORMAL LOW (ref 12.0–15.0)
Immature Granulocytes: 0 %
Lymphocytes Relative: 27 %
Lymphs Abs: 0.9 10*3/uL (ref 0.7–4.0)
MCH: 31.4 pg (ref 26.0–34.0)
MCHC: 32.9 g/dL (ref 30.0–36.0)
MCV: 95.5 fL (ref 80.0–100.0)
Monocytes Absolute: 0.4 10*3/uL (ref 0.1–1.0)
Monocytes Relative: 13 %
Neutro Abs: 1.9 10*3/uL (ref 1.7–7.7)
Neutrophils Relative %: 57 %
Platelet Count: 256 10*3/uL (ref 150–400)
RBC: 3.09 MIL/uL — ABNORMAL LOW (ref 3.87–5.11)
RDW: 15 % (ref 11.5–15.5)
WBC Count: 3.3 10*3/uL — ABNORMAL LOW (ref 4.0–10.5)
nRBC: 0 % (ref 0.0–0.2)

## 2023-02-05 LAB — CMP (CANCER CENTER ONLY)
ALT: 15 U/L (ref 0–44)
AST: 22 U/L (ref 15–41)
Albumin: 3.8 g/dL (ref 3.5–5.0)
Alkaline Phosphatase: 57 U/L (ref 38–126)
Anion gap: 5 (ref 5–15)
BUN: 27 mg/dL — ABNORMAL HIGH (ref 8–23)
CO2: 27 mmol/L (ref 22–32)
Calcium: 9.7 mg/dL (ref 8.9–10.3)
Chloride: 103 mmol/L (ref 98–111)
Creatinine: 1 mg/dL (ref 0.44–1.00)
GFR, Estimated: 60 mL/min — ABNORMAL LOW (ref >60)
Glucose, Bld: 97 mg/dL (ref 70–99)
Potassium: 4.4 mmol/L (ref 3.5–5.1)
Sodium: 135 mmol/L (ref 135–145)
Total Bilirubin: 0.5 mg/dL (ref 0.0–1.2)
Total Protein: 9.5 g/dL — ABNORMAL HIGH (ref 6.5–8.1)

## 2023-02-05 LAB — FERRITIN: Ferritin: 366 ng/mL — ABNORMAL HIGH (ref 11–307)

## 2023-02-05 NOTE — Progress Notes (Signed)
 Patient Care Team: Ransom Other, MD as PCP - General (Internal Medicine) Tobb, Kardie, DO as PCP - Cardiology (Cardiology) Odean Potts, MD as Consulting Physician (Hematology and Oncology) Patel, Donika K, DO as Consulting Physician (Neurology)  DIAGNOSIS:  Encounter Diagnosis  Name Primary?   Normocytic anemia Yes     CHIEF COMPLIANT: Follow-up of anemia and leukopenia  HISTORY OF PRESENT ILLNESS:   History of Present Illness   The patient, with a history of anemia and nerve damage, presents for a routine follow-up. She reports a recent diagnosis of a pinched nerve in her back, which was discovered after she was referred to Dr. Mai due to back inflammation. She has been experiencing sensations in her feet similar to walking on stones, initially suspected to be neuropathy. However, a nerve test conducted by Dr. Tobie revealed nerve damage, but not neuropathy.  The patient's anemia remains stable, with a hemoglobin level of 9.8, which is consistent with her usual levels. She has been taking vitamin B12 as part of a multivitamin regimen, and recent blood work showed elevated B12 levels. She has also recently switched primary care providers to Dr. Grayce Slocumb, who has recommended the addition of Jardiance  to her medication regimen due to an A1c level of 6.5.  The patient also reports fatigue, which she attributes to lung issues. She describes feeling out of breath and needing to rest frequently, particularly during physically demanding tasks such as cooking. She has learned to pace herself to manage these symptoms.         ALLERGIES:  is allergic to avelox [moxifloxacin hcl in nacl], avelox [moxifloxacin], azathioprine , codeine, erythromycin, fexofenadine, levocetirizine, lisinopril, sulfa antibiotics, sulfonamide derivatives, and zyrtec [cetirizine].  MEDICATIONS:  Current Outpatient Medications  Medication Sig Dispense Refill   acetaminophen  (TYLENOL ) 500 MG tablet Take  500-1,000 mg by mouth every 6 (six) hours as needed for moderate pain or headache.     amLODipine  (NORVASC ) 5 MG tablet Take 1 tablet (5 mg total) by mouth daily. May take an additional 2.5 mg tablet if systolic blood pressure 160 if needed. (Patient not taking: Reported on 02/03/2023) 135 tablet 3   butalbital -acetaminophen -caffeine  (FIORICET) 50-325-40 MG tablet Take 1 tablet by mouth 2 (two) times daily as needed for migraine.     chlorpheniramine (CHLOR-TRIMETON) 4 MG tablet Take 4 mg by mouth every 4 (four) hours as needed for allergies.     Cholecalciferol (VITAMIN D) 50 MCG (2000 UT) tablet Take 1,000 Units by mouth daily. Tues and Thurs     dextromethorphan (DELSYM) 30 MG/5ML liquid Take 30 mg by mouth 2 (two) times daily as needed for cough.     epoetin  alfa (EPOGEN ) 10000 UNIT/ML injection as directed Injection once every 3 weeks for 3 months     Homeopathic Products (ARNICARE PAIN RELIEF EX) Apply 1 application. topically daily as needed (pain).     loratadine (CLARITIN) 10 MG tablet Take 10 mg by mouth daily as needed for allergies.     Respiratory Therapy Supplies (FLUTTER) DEVI Use as directed. 1 each 0   rosuvastatin  (CRESTOR ) 5 MG tablet Take 1 tablet (5 mg total) by mouth every Tuesday. 12 tablet 3   valsartan  (DIOVAN ) 320 MG tablet Take 1 tablet (320 mg total) by mouth daily. 90 tablet 2   Wheat Dextrin (BENEFIBER PO) Take 1 Scoop by mouth daily.     Current Facility-Administered Medications  Medication Dose Route Frequency Provider Last Rate Last Admin   desmopressin  (DDAVP ) 20.4 mcg in sodium  chloride 0.9 % 50 mL IVPB  0.3 mcg/kg Intravenous Once Nancy Pizza, PA-C       desmopressin  (DDAVP ) 20.4 mcg in sodium chloride  0.9 % 50 mL IVPB  0.3 mcg/kg Intravenous Once Nancy Pizza, PA-C        PHYSICAL EXAMINATION: ECOG PERFORMANCE STATUS: 1 - Symptomatic but completely ambulatory  Vitals:   02/05/23 0817 02/05/23 0818  BP: (!) 152/91 129/88  Pulse: 87    Resp: 17   Temp: 98 F (36.7 C)   SpO2: 96%    Filed Weights   02/05/23 0817  Weight: 161 lb 7 oz (73.2 kg)     LABORATORY DATA:  I have reviewed the data as listed    Latest Ref Rng & Units 02/03/2023    3:37 PM 08/04/2022    8:11 AM 04/28/2022    8:16 AM  CMP  Glucose 70 - 99 mg/dL 93  85  898   BUN 8 - 27 mg/dL 17  22  25    Creatinine 0.57 - 1.00 mg/dL 8.98  8.96  8.98   Sodium 134 - 144 mmol/L 134  133  135   Potassium 3.5 - 5.2 mmol/L 4.4  4.4  4.1   Chloride 96 - 106 mmol/L 98  100  102   CO2 20 - 29 mmol/L 19  28  26    Calcium  8.7 - 10.3 mg/dL 9.6  89.8  89.8   Total Protein 6.0 - 8.5 g/dL 9.4  9.8  9.9   Total Bilirubin 0.0 - 1.2 mg/dL 0.5  0.5  0.5   Alkaline Phos 44 - 121 IU/L 70  55  58   AST 0 - 40 IU/L 25  22  21    ALT 0 - 32 IU/L 16  16  15      Lab Results  Component Value Date   WBC 3.3 (L) 02/05/2023   HGB 9.7 (L) 02/05/2023   HCT 29.5 (L) 02/05/2023   MCV 95.5 02/05/2023   PLT 256 02/05/2023   NEUTROABS 1.9 02/05/2023    ASSESSMENT & PLAN:  Normocytic anemia 04/17/2020: Hemoglobin 7.9, MCV 92.2, WBC 4.9, platelets 88, erythropoietin  38.9, ferritin 2445, iron saturation 33%, LDH 196, SPEP: Polyclonal rise in immunoglobulins, no M protein, AST 55, ALT 66, alkaline phosphatase 496, creatinine 1 07/06/2020: WBC 3.1, hemoglobin 9.4, MCV 99, platelets 254, ANC 1.7, ESR 41, CRP 1, total protein 9.3, globulins 5.2, rest of the LFTs are normal 10/07/2021: Hemoglobin 10.4 04/28/2022: Hemoglobin 10.4 (this is without Retacrit  injections) 08/04/2022: Hemoglobin 10.1, WBC 3.3 02/05/2023: Hemoglobin 9.7, WBC 3.3, ANC 1.9   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 Pylori   Indication for bone marrow biopsy: If ANC drops below 1 and development of other cytopenias   Prior treatment: Retacrit  40,000 units every 3 weeks started 08/05/2021, last injection on 11/05/2021 We  discontinued Retacrit  injection   Leukopenia: ANC is 2.  Therefore there is no role for bone marrow biopsy at this time. She is following with rheumatology. I will see her back in 6 months with labs and follow-up. ------------------------------------- Assessment and Plan    Anemia Stable hemoglobin at 9.8, consistent with previous levels. No new symptoms reported. Patient is taking B12 supplement with multivitamin. Elevated B12 level is not concerning. -Monitor hemoglobin every 6 months. -Continue B12 supplementation.  Peripheral Neuropathy Patient reports sensation of walking on stones. Nerve test conducted by Dr. Tobie revealed nerve damage, not  neuropathy. -No changes to current management plan discussed.  Chronic Kidney Disease (Stage 3) Discussed by patient's primary care physician, Dr. Jarold. No new symptoms reported. -No changes to current management plan discussed.  Diabetes A1c of 6.5. Dr. Jarold plans to start Jardiance  for heart and kidney protection. -Confirmed with patient that Jardiance  is safe and will not affect blood counts.  Follow-up in 6 months or sooner if worsening fatigue or other symptoms develop.          Orders Placed This Encounter  Procedures   CBC with Differential ([facility] Only)    Standing Status:   Future    Expiration Date:   02/05/2024   Ferritin    Standing Status:   Future    Expiration Date:   02/05/2024   Iron and Iron Binding Capacity ([facility] only)    Standing Status:   Future    Expiration Date:   02/05/2024   CMP ([facility] Center only)    Standing Status:   Future    Expiration Date:   02/05/2024   The patient has a good understanding of the overall plan. she agrees with it. she will call with any problems that may develop before the next visit here. Total time spent: 30 mins including face to face time and time spent for planning, charting and co-ordination of care   Naomi MARLA Chad, MD 02/05/23

## 2023-02-05 NOTE — Assessment & Plan Note (Signed)
 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 Pylori   Indication for bone marrow biopsy: If ANC drops below 1 and development of other cytopenias   Prior treatment: Retacrit  40,000 units every 3 weeks started 08/05/2021, last injection on 11/05/2021 We discontinued Retacrit  injection   Leukopenia: ANC is 2.  Therefore there is no role for bone marrow biopsy at this time. She is following with rheumatology. I will see her back in 6 months with labs and follow-up.

## 2023-02-06 ENCOUNTER — Encounter: Payer: Self-pay | Admitting: Cardiology

## 2023-02-06 ENCOUNTER — Ambulatory Visit: Payer: Medicare PPO | Attending: Cardiology | Admitting: Cardiology

## 2023-02-06 VITALS — BP 120/74 | HR 92 | Ht 64.0 in | Wt 163.2 lb

## 2023-02-06 DIAGNOSIS — R0602 Shortness of breath: Secondary | ICD-10-CM | POA: Diagnosis not present

## 2023-02-06 DIAGNOSIS — E782 Mixed hyperlipidemia: Secondary | ICD-10-CM | POA: Diagnosis not present

## 2023-02-06 DIAGNOSIS — I10 Essential (primary) hypertension: Secondary | ICD-10-CM

## 2023-02-06 NOTE — Patient Instructions (Signed)
 Medication Instructions:  Your physician recommends that you continue on your current medications as directed. Please refer to the Current Medication list given to you today.  *If you need a refill on your cardiac medications before your next appointment, please call your pharmacy*   Follow-Up: At [facility], you and your health needs are our priority.  As part of our continuing mission to provide you with exceptional heart care, we have created designated Provider Care Teams.  These Care Teams include your primary Cardiologist (physician) and Advanced Practice Providers (APPs -  Physician Assistants and Nurse Practitioners) who all work together to provide you with the care you need, when you need it.  Your next appointment:   9 month(s)  Provider:   Thomasene Ripple, DO

## 2023-02-06 NOTE — Progress Notes (Signed)
 Cardiology Office Note:    Date:  02/06/2023   ID:  Brandy Medina, DOB [birth year], MRN [id number]  PCP:  Jarold Medici, MD  Cardiologist:  Dub Huntsman, DO  Electrophysiologist:  None   Referring MD: Ransom Other, MD    I have shortness of breath and also has been having some intermittent twinging pain onto my breast.   History of Present Illness:    Brandy Medina is a 73 y.o. female with a hx of hypertension, iron deficiency anemia, stroke and syndrome, ILD, coronary calcification seen on coronary CT calcium  scoring, echocardiogram in 2022 showed normal EF mild LVH grade 1 diastolic dysfunction, pulmonary fibrosis and Sjogren's syndrome  At her last visit she was experiencing shortness of breath.  Sent the patient for a stress test to understand if there is exercise-induced gradients/pulmonary hypertension.  This was normal with no evidence of pulmonary hypertension RV/RA gradient 41 mmHg.    Past Medical History:  Diagnosis Date   Allergic rhinitis    Diabetes (HCC)    HTN (hypertension)    Migraines    PONV (postoperative nausea and vomiting)    Pulmonary fibrosis (HCC)     Past Surgical History:  Procedure Laterality Date   ABDOMINAL HYSTERECTOMY  1989   partial   BIOPSY  06/01/2020   Procedure: BIOPSY;  Surgeon: Saintclair Jasper, MD;  Location: [facility];  Service: Gastroenterology;;  EGD and COLON   BIOPSY  06/04/2021   Procedure: BIOPSY;  Surgeon: Saintclair Jasper, MD;  Location: [facility];  Service: Gastroenterology;;   BREAST SURGERY     COLONOSCOPY WITH PROPOFOL  N/A 06/01/2020   Procedure: COLONOSCOPY WITH PROPOFOL ;  Surgeon: Saintclair Jasper, MD;  Location: [facility];  Service: Gastroenterology;  Laterality: N/A;   ESOPHAGOGASTRODUODENOSCOPY N/A 06/04/2021   Procedure: ESOPHAGOGASTRODUODENOSCOPY (EGD);  Surgeon: Saintclair Jasper, MD;  Location: [facility];  Service: Gastroenterology;  Laterality: N/A;   ESOPHAGOGASTRODUODENOSCOPY (EGD) WITH PROPOFOL  N/A 06/01/2020    Procedure: ESOPHAGOGASTRODUODENOSCOPY (EGD) WITH PROPOFOL ;  Surgeon: Saintclair Jasper, MD;  Location: [facility];  Service: Gastroenterology;  Laterality: N/A;   POLYPECTOMY  06/01/2020   Procedure: POLYPECTOMY;  Surgeon: Saintclair Jasper, MD;  Location: [facility];  Service: Gastroenterology;;   SALPINGOOPHORECTOMY Left     Current Medications: Current Meds  Medication Sig   acetaminophen  (TYLENOL ) 500 MG tablet Take 500-1,000 mg by mouth every 6 (six) hours as needed for moderate pain or headache.   amLODipine  (NORVASC ) 5 MG tablet Take 1 tablet (5 mg total) by mouth daily. May take an additional 2.5 mg tablet if systolic blood pressure 160 if needed.   butalbital -acetaminophen -caffeine  (FIORICET) 50-325-40 MG tablet Take 1 tablet by mouth 2 (two) times daily as needed for migraine.   chlorpheniramine (CHLOR-TRIMETON) 4 MG tablet Take 4 mg by mouth every 4 (four) hours as needed for allergies.   Cholecalciferol (VITAMIN D) 50 MCG (2000 UT) tablet Take 1,000 Units by mouth daily. Tues and Thurs   dextromethorphan (DELSYM) 30 MG/5ML liquid Take 30 mg by mouth 2 (two) times daily as needed for cough.   epoetin  alfa (EPOGEN ) 10000 UNIT/ML injection as directed Injection once every 3 weeks for 3 months   Homeopathic Products (ARNICARE PAIN RELIEF EX) Apply 1 application. topically daily as needed (pain).   loratadine (CLARITIN) 10 MG tablet Take 10 mg by mouth daily as needed for allergies.   Respiratory Therapy Supplies (FLUTTER) DEVI Use as directed.   rosuvastatin  (CRESTOR ) 5 MG tablet Take 1 tablet (5 mg total) by mouth every  Tuesday.   valsartan  (DIOVAN ) 320 MG tablet Take 1 tablet (320 mg total) by mouth daily.   Wheat Dextrin (BENEFIBER PO) Take 1 Scoop by mouth daily.   Current Facility-Administered Medications for the 02/06/23 encounter (Office Visit) with Leilan Bochenek, DO  Medication   desmopressin  (DDAVP ) 20.4 mcg in sodium chloride  0.9 % 50 mL IVPB   desmopressin  (DDAVP ) 20.4 mcg in  sodium chloride  0.9 % 50 mL IVPB     Allergies:   Avelox [moxifloxacin hcl in nacl], Avelox [moxifloxacin], Azathioprine , Codeine, Erythromycin, Fexofenadine, Levocetirizine, Lisinopril, Sulfa antibiotics, Sulfonamide derivatives, and Zyrtec [cetirizine]   Social History   Socioeconomic History   Marital status: Married    Spouse name: Not on file   Number of children: Not on file   Years of education: Not on file   Highest education level: Not on file  Occupational History   Not on file  Tobacco Use   Smoking status: Former    Current packs/day: 0.00    Average packs/day: 0.5 packs/day for 18.0 years (9.0 ttl pk-yrs)    Types: Cigarettes    Start date: 07/06/1975    Quit date: 07/05/1993    Years since quitting: 29.6   Smokeless tobacco: Never  Substance and Sexual Activity   Alcohol use: Yes    Comment: Occasional Wine or Martini   Drug use: Never   Sexual activity: Not Currently  Other Topics Concern   Not on file  Social History Narrative   Are you right handed or left handed? Right ended   Are you currently employed ? No   What is your current occupation? Retired   Do you live at home alone? No    Who lives with you?  Lives with husband    What type of home do you live in: 1 story or 2 story? Lives in a two story home.        Social Drivers of Corporate Investment Banker Strain: Not on file  Food Insecurity: Not on file  Transportation Needs: Not on file  Physical Activity: Not on file  Stress: Not on file  Social Connections: Not on file     Family History: The patient's family history includes Bronchitis in her mother; Diabetes in her sister and sister; Heart attack in her brother and father; Heart disease in her father and mother; Hypertension in her sister and sister; Renal Disease in her brother; Stroke in her brother.  ROS:   Review of Systems  Constitution: Negative for decreased appetite, fever and weight gain.  HENT: Negative for congestion, ear  discharge, hoarse voice and sore throat.   Eyes: Negative for discharge, redness, vision loss in right eye and visual halos.  Cardiovascular: Negative for chest pain, dyspnea on exertion, leg swelling, orthopnea and palpitations.  Respiratory: Negative for cough, hemoptysis, shortness of breath and snoring.   Endocrine: Negative for heat intolerance and polyphagia.  Hematologic/Lymphatic: Negative for bleeding problem. Does not bruise/bleed easily.  Skin: Negative for flushing, nail changes, rash and suspicious lesions.  Musculoskeletal: Negative for arthritis, joint pain, muscle cramps, myalgias, neck pain and stiffness.  Gastrointestinal: Negative for abdominal pain, bowel incontinence, diarrhea and excessive appetite.  Genitourinary: Negative for decreased libido, genital sores and incomplete emptying.  Neurological: Negative for brief paralysis, focal weakness, headaches and loss of balance.  Psychiatric/Behavioral: Negative for altered mental status, depression and suicidal ideas.  Allergic/Immunologic: Negative for HIV exposure and persistent infections.    EKGs/Labs/Other Studies Reviewed:    The  following studies were reviewed today:   EKG:  None today  TTE 05/10/2020 IMPRESSIONS   1. Left ventricular ejection fraction, by estimation, is 65 to 70%. Left ventricular ejection fraction by 3D volume is 72 %. The left ventricle has normal function. The left ventricle has no regional wall motion  abnormalities. There is mild asymmetric  left ventricular hypertrophy of the basal-septal segment. Left ventricular  diastolic parameters are consistent with Grade I diastolic dysfunction  (impaired relaxation).   2. Right ventricular systolic function is normal. The right ventricular  size is normal. There is normal pulmonary artery systolic pressure. The  estimated right ventricular systolic pressure is 29.0 mmHg.   3. Left atrial size was mildly dilated.   4. The mitral valve is normal in  structure. Trivial mitral valve  regurgitation.   5. The aortic valve is tricuspid. There is mild thickening of the aortic  valve. Aortic valve regurgitation is not visualized. No aortic stenosis is  present.   6. The inferior vena cava is normal in size with greater than 50%  respiratory variability, suggesting right atrial pressure of 3 mmHg.   Comparison(s): Compared to prior echo in 2017, there is no significant  change.   FINDINGS   Left Ventricle: Left ventricular ejection fraction, by estimation, is 65  to 70%. Left ventricular ejection fraction by 3D volume is 72 %. The left  ventricle has normal function. The left ventricle has no regional wall  motion abnormalities. The left  ventricular internal cavity size was normal in size. There is mild  asymmetric left ventricular hypertrophy of the basal-septal segment. Left  ventricular diastolic parameters are consistent with Grade I diastolic  dysfunction (impaired relaxation).   Right Ventricle: The right ventricular size is normal. No increase in  right ventricular wall thickness. Right ventricular systolic function is  normal. There is normal pulmonary artery systolic pressure. The tricuspid  regurgitant velocity is 2.55 m/s, and   with an assumed right atrial pressure of 3 mmHg, the estimated right  ventricular systolic pressure is 29.0 mmHg.   Left Atrium: Left atrial size was mildly dilated.   Right Atrium: Right atrial size was normal in size.   Pericardium: There is no evidence of pericardial effusion.   Mitral Valve: The mitral valve is normal in structure. There is mild  thickening of the mitral valve leaflet(s). There is mild calcification of  the mitral valve leaflet(s). Mild to moderate mitral annular  calcification. Trivial mitral valve regurgitation.   Tricuspid Valve: The tricuspid valve is normal in structure. Tricuspid  valve regurgitation is trivial.   Aortic Valve: The aortic valve is tricuspid. There  is mild thickening of  the aortic valve. Aortic valve regurgitation is not visualized. No aortic  stenosis is present.   Pulmonic Valve: The pulmonic valve was not well visualized. Pulmonic valve  regurgitation is not visualized.   Aorta: The aortic root and ascending aorta are structurally normal, with  no evidence of dilitation.   Venous: The inferior vena cava is normal in size with greater than 50%  respiratory variability, suggesting right atrial pressure of 3 mmHg.   IAS/Shunts: No atrial level shunt detected by color flow Doppler.     Recent Labs: 02/03/2023: TSH 1.880 02/05/2023: ALT 15; BUN 27; Creatinine 1.00; Hemoglobin 9.7; Platelet Count 256; Potassium 4.4; Sodium 135  Recent Lipid Panel    Component Value Date/Time   CHOL 194 02/03/2023 1537   TRIG 69 02/03/2023 1537   HDL 94 02/03/2023 1537  CHOLHDL 2.1 02/03/2023 1537   LDLCALC 87 02/03/2023 1537    Physical Exam:    VS:  BP 120/74 (BP Location: Right Arm, Patient Position: Sitting, Cuff Size: Normal)   Pulse 92   Ht 5' 4 (1.626 m)   Wt 163 lb 3.2 oz (74 kg)   SpO2 98%   BMI 28.01 kg/m     Wt Readings from Last 3 Encounters:  02/06/23 163 lb 3.2 oz (74 kg)  02/05/23 161 lb 7 oz (73.2 kg)  02/03/23 163 lb (73.9 kg)     GEN: Well nourished, well developed in no acute distress HEENT: Normal NECK: No JVD; No carotid bruits LYMPHATICS: No lymphadenopathy CARDIAC: S1S2 noted,RRR, no murmurs, rubs, gallops RESPIRATORY:  Clear to auscultation without rales, wheezing or rhonchi  ABDOMEN: Soft, non-tender, non-distended, +bowel sounds, no guarding. EXTREMITIES: No edema, No cyanosis, no clubbing MUSCULOSKELETAL:  No deformity  SKIN: Warm and dry NEUROLOGIC:  Alert and oriented x 3, non-focal PSYCHIATRIC:  Normal affect, good insight  ASSESSMENT:    1. Shortness of breath   2. Essential hypertension   3. Mixed hyperlipidemia     PLAN:    Her shortness of breath may be due to her interstitial lung  disease.  I wonder if she may benefit from pulmonary rehabilitation.  But I will defer to the pulmonary team on this.  Blood pressure is acceptable, continue with current antihypertensive regimen.  Hyperlipidemia - continue with current statin medication.  She has diabetes care with Dr. Catheryn Slocumb as her PCP.    The patient is in agreement with the above plan. The patient left the office in stable condition.  The patient will follow up in   Medication Adjustments/Labs and Tests Ordered: Current medicines are reviewed at length with the patient today.  Concerns regarding medicines are outlined above.  No orders of the defined types were placed in this encounter.  No orders of the defined types were placed in this encounter.   Patient Instructions  Medication Instructions:  Your physician recommends that you continue on your current medications as directed. Please refer to the Current Medication list given to you today.  *If you need a refill on your cardiac medications before your next appointment, please call your pharmacy*   Follow-Up: At [facility], you and your health needs are our priority.  As part of our continuing mission to provide you with exceptional heart care, we have created designated Provider Care Teams.  These Care Teams include your primary Cardiologist (physician) and Advanced Practice Providers (APPs -  Physician Assistants and Nurse Practitioners) who all work together to provide you with the care you need, when you need it.  Your next appointment:   9 month(s)  Provider:   Mitsuru Dault, DO             Adopting a Healthy Lifestyle.  Know what a healthy weight is for you (roughly BMI <25) and aim to maintain this   Aim for 7+ servings of fruits and vegetables daily   65-80+ fluid ounces of water or unsweet tea for healthy kidneys   Limit to max 1 drink of alcohol per day; avoid smoking/tobacco   Limit animal fats in diet for cholesterol  and heart health - choose grass fed whenever available   Avoid highly processed foods, and foods high in saturated/trans fats   Aim for low stress - take time to unwind and care for your mental health   Aim for 150 min  of moderate intensity exercise weekly for heart health, and weights twice weekly for bone health   Aim for 7-9 hours of sleep daily   When it comes to diets, agreement about the perfect plan isnt easy to find, even among the experts. Experts at the [facility] [employer] developed an idea known as the Healthy Eating Plate. Just imagine a plate divided into logical, healthy portions.   The emphasis is on diet quality:   Load up on vegetables and fruits - one-half of your plate: Aim for color and variety, and remember that potatoes dont count.   Go for whole grains - one-quarter of your plate: Whole wheat, barley, wheat berries, quinoa, oats, brown rice, and foods made with them. If you want pasta, go with whole wheat pasta.   Protein power - one-quarter of your plate: Fish, chicken, beans, and nuts are all healthy, versatile protein sources. Limit red meat.   The diet, however, does go beyond the plate, offering a few other suggestions.   Use healthy plant oils, such as olive, canola, soy, corn, sunflower and peanut. Check the labels, and avoid partially hydrogenated oil, which have unhealthy trans fats.   If youre thirsty, drink water. Coffee and tea are good in moderation, but skip sugary drinks and limit milk and dairy products to one or two daily servings.   The type of carbohydrate in the diet is more important than the amount. Some sources of carbohydrates, such as vegetables, fruits, whole grains, and beans-are healthier than others.   Finally, stay active  Signed, Dub Huntsman, DO  02/06/2023 8:37 AM     [facility]

## 2023-02-09 ENCOUNTER — Other Ambulatory Visit: Payer: Self-pay | Admitting: Internal Medicine

## 2023-02-09 MED ORDER — GABAPENTIN 100 MG PO CAPS: 100.0000 mg | ORAL_CAPSULE | Freq: Every day | ORAL | 2 refills | Status: DC

## 2023-02-09 MED ORDER — EMPAGLIFLOZIN 10 MG PO TABS: 10.0000 mg | ORAL_TABLET | Freq: Every day | ORAL | Status: DC

## 2023-02-10 ENCOUNTER — Encounter: Payer: Self-pay | Admitting: Pulmonary Disease

## 2023-02-10 ENCOUNTER — Ambulatory Visit: Payer: Medicare PPO | Admitting: Pulmonary Disease

## 2023-02-10 VITALS — BP 122/76 | HR 81 | Ht 64.0 in | Wt 163.0 lb

## 2023-02-10 DIAGNOSIS — R200 Anesthesia of skin: Secondary | ICD-10-CM | POA: Diagnosis not present

## 2023-02-10 DIAGNOSIS — J849 Interstitial pulmonary disease, unspecified: Secondary | ICD-10-CM

## 2023-02-10 DIAGNOSIS — Z23 Encounter for immunization: Secondary | ICD-10-CM

## 2023-02-10 LAB — PULMONARY FUNCTION TEST
DL/VA % pred: 71 %
DL/VA: 2.97 ml/min/mmHg/L
DLCO cor % pred: 56 %
DLCO cor: 10.98 ml/min/mmHg
DLCO unc % pred: 49 %
DLCO unc: 9.49 ml/min/mmHg
FEF 25-75 Pre: 1.68 L/s
FEF2575-%Pred-Pre: 92 %
FEV1-%Pred-Pre: 94 %
FEV1-Pre: 2.08 L
FEV1FVC-%Pred-Pre: 99 %
FEV6-%Pred-Pre: 97 %
FEV6-Pre: 2.72 L
FEV6FVC-%Pred-Pre: 104 %
FVC-%Pred-Pre: 94 %
FVC-Pre: 2.76 L
Pre FEV1/FVC ratio: 76 %
Pre FEV6/FVC Ratio: 100 %
RV % pred: 141 %
RV: 3.16 L
TLC % pred: 114 %
TLC: 5.81 L

## 2023-02-10 NOTE — Patient Instructions (Signed)
 VISIT SUMMARY:  During today's visit, we discussed your increasing breathlessness, particularly when climbing stairs, and the sensations of pebbles under your feet and numbness in your hand. We reviewed your history of pulmonary fibrosis, possibly related to Sjogren's disease, and your previous difficulties with medications. We also talked about your interest in trying a new medication, Ofev, and participating in pulmonary rehabilitation.  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 This may be related to your Sjogren's disease. We discussed starting a new medication, Nintedanib (Ofev), which may help slow the progression of the disease. However, it can have side effects like liver toxicity and gastrointestinal issues. We will refer you to the pharmacy team for a detailed review and discussion of these potential side effects before you decide to start the medication.  -PERIPHERAL NEUROPATHY: Peripheral neuropathy is a condition that results from damage to the nerves outside of the brain and spinal cord, causing symptoms like numbness and a sensation of pebbles under your feet. You have started on Gabapentin  to help manage these symptoms. Please continue taking Gabapentin  and monitor for any improvement or side effects.  -GENERAL HEALTH MAINTENANCE: To help manage your symptoms and improve your exercise tolerance, we will refer you for pulmonary rehabilitation. Additionally, we recommend considering a pneumonia vaccination soon, as your last one was in 2017, and checking if you are due for an influenza vaccination.  INSTRUCTIONS:  Please follow up with the pharmacy team to discuss the potential side effects of Nintedanib (Ofev) before making a decision to start the medication. Also, schedule an appointment for pulmonary rehabilitation and check your vaccination records  to see if you are due for a pneumonia or influenza vaccine.

## 2023-02-10 NOTE — Patient Instructions (Signed)
Spirometry/DLCO and lung volumes performed today. 

## 2023-02-10 NOTE — Progress Notes (Signed)
Spirometry/DLCO and lung volumes performed today. 

## 2023-02-10 NOTE — Progress Notes (Addendum)
 Brandy Medina    [id number]    [birth year]  Primary Care Physician:Sanders, Catheryn, MD  Referring Physician: Ransom Other, MD [address] [place],  [place] [address]  Chief complaint: Follow up for interstitial lung disease, Sjogren's syndrome  HPI: 73 y.o. patient with history of von Willebrand's disease, Sjogren's syndrome, maintained on Plaquenil  which is stopped due to darkening skin.  Started on Imuran  by Dr. Mai, rheumatology Diagnosed with streptococcal pneumonia in February 2022 which is treated with antibiotics CT scan shows progressive UIP fibrosis and she has been referred to pulmonary clinic for further evaluation  Complains of minimal cough, chronic dyspnea on exertion which is progressive in nature Has dry mouth, dry eyes  Azathioprine  was held in February 2022 due to pancytopenia, elevated LFTs.  She is undergoing work-up with Dr. Gudena for this.  She does have formal diagnosis of von Willebrand's disease but on repeat testing does not have any evidence of this.  She may need a bone marrow biopsy  She started Esbriet  on 04/26/2020.  It was initially well-tolerated but last week she developed side effects of dizziness, lightheadedness, headache, loss of appetite.  She has held the Esbriet  and is now feeling better.  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  She has done cushions in the living room couch but exposures is minimal Smoking history: 9  pack-year smoker.  Quit in 1995 Travel history: Originally from [place] .  No significant recent travel Relevant family history: No family history of lung disease  Interim history: Discussed the use of AI scribe software for clinical note transcription with the patient, who gave verbal consent to proceed.  The patient, with a history of pulmonary fibrosis possibly secondary to Sjogren's disease, presents with increasing breathlessness, particularly  when climbing stairs. She also reports nerve ending problems, with sensations of pebbles under her feet and numbness in her hand. The patient has previously tried various medications, including Imuran  and Esbriet , but has not tolerated them well due to side effects. The patient is considering trying a new medication, Ofev, and is also interested in pulmonary rehab. The patient also mentions that she has been less active recently, which may be contributing to her breathlessness.    Outpatient Encounter Medications as of 02/10/2023  Medication Sig   acetaminophen  (TYLENOL ) 500 MG tablet Take 500-1,000 mg by mouth every 6 (six) hours as needed for moderate pain or headache.   amLODipine  (NORVASC ) 5 MG tablet Take 1 tablet (5 mg total) by mouth daily. May take an additional 2.5 mg tablet if systolic blood pressure 160 if needed.   butalbital -acetaminophen -caffeine  (FIORICET) 50-325-40 MG tablet Take 1 tablet by mouth 2 (two) times daily as needed for migraine.   chlorpheniramine (CHLOR-TRIMETON) 4 MG tablet Take 4 mg by mouth every 4 (four) hours as needed for allergies.   Cholecalciferol (VITAMIN D) 50 MCG (2000 UT) tablet Take 1,000 Units by mouth daily. Tues and Thurs   dextromethorphan (DELSYM) 30 MG/5ML liquid Take 30 mg by mouth 2 (two) times daily as needed for cough.   empagliflozin  (JARDIANCE ) 10 MG TABS tablet Take 1 tablet (10 mg total) by mouth daily before breakfast.   epoetin  alfa (EPOGEN ) 10000 UNIT/ML injection as directed Injection once every 3 weeks for 3 months   gabapentin  (NEURONTIN ) 100 MG capsule Take 1 capsule (100 mg total) by mouth daily.   Homeopathic Products (ARNICARE PAIN RELIEF EX) Apply 1 application.  topically daily as needed (pain).   loratadine (CLARITIN) 10 MG tablet Take 10 mg by mouth daily as needed for allergies.   Respiratory Therapy Supplies (FLUTTER) DEVI Use as directed.   rosuvastatin  (CRESTOR ) 5 MG tablet Take 1 tablet (5 mg total) by mouth every Tuesday.    valsartan  (DIOVAN ) 320 MG tablet Take 1 tablet (320 mg total) by mouth daily.   Wheat Dextrin (BENEFIBER PO) Take 1 Scoop by mouth daily.   Facility-Administered Encounter Medications as of 02/10/2023  Medication   desmopressin  (DDAVP ) 20.4 mcg in sodium chloride  0.9 % 50 mL IVPB   desmopressin  (DDAVP ) 20.4 mcg in sodium chloride  0.9 % 50 mL IVPB  , Physical Exam: Blood pressure 122/76, pulse 81, height 5' 4 (1.626 m), weight 163 lb (73.9 kg), SpO2 99%. Gen:      No acute distress HEENT:  EOMI, sclera anicteric Neck:     No masses; no thyromegaly Lungs:    Clear to auscultation bilaterally; normal respiratory effort CV:         Regular rate and rhythm; no murmurs Abd:      + bowel sounds; soft, non-tender; no palpable masses, no distension Ext:    No edema; adequate peripheral perfusion Skin:      Warm and dry; no rash Neuro: alert and oriented x 3 Psych: normal mood and affect   Data Reviewed: Imaging: CT chest 02/20/2020-moderate to severe pulmonary fibrosis in UIP pattern.  Worsened since previous scan in 2010, groundglass opacities in the peripheral right upper lobe.   High-resolution CT 12/13/2021-slightly worsened pulmonary fibrosis in UIP pattern.  High resolution CT 01/11/2023-continued progression of UIP pattern pulmonary fibrosis I have reviewed the images personally.  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 Previous intolerance to Esbriet  and Imuran  due to side effects. Discussed the risks/benefits of starting Nintedanib (Ofev) including potential liver  toxicity and gastrointestinal side effects. -Refer to pharmacy team for detailed medication review and discussion of potential side effects. -Consider starting Nintedanib (Ofev) pending patient's decision after discussion with pharmacy team.  Peripheral Neuropathy Symptoms of numbness and pebbles under feet sensation. No diagnosis of neuropathy but symptoms suggestive. Recently started on Gabapentin . -Continue Gabapentin  and monitor for symptom improvement and potential side effects.  Sinus congestion, postnasal drip She has not tolerated steroid nasal sprays in the past due to epistaxis Flutter valve for mucociliary clearance  General Health Maintenance -Refer for pulmonary rehabilitation to improve exercise tolerance and manage symptoms. -Pneumonia vaccination (last received in 2017).        Plan/Recommendations: Hold off on antifibrotic therapy per patient preference Pharmacy referral Pulmonary rehab Flutter valve  Lonna Coder MD [facility] 02/10/2023, 4:00 PM  CC: Husain, Karrar, MD

## 2023-02-13 ENCOUNTER — Encounter (HOSPITAL_COMMUNITY): Payer: Self-pay

## 2023-02-13 NOTE — Progress Notes (Signed)
Received referral from Dr. Isaiah Serge for this pt to participate in Pulmonary Rehab with the diagnosis of ILD. Clinical review of pt follow up appt on 02/10/23 Pulmonary office note. Pt appropriate for scheduling for Pulmonary rehab. Will forward to support staff for scheduling and verification of insurance eligibility/benefits with pt consent.   Brandy Medina Cardiac and Pulmonary Rehab

## 2023-02-15 DIAGNOSIS — R202 Paresthesia of skin: Secondary | ICD-10-CM | POA: Insufficient documentation

## 2023-02-15 DIAGNOSIS — I700 Atherosclerosis of aorta: Secondary | ICD-10-CM | POA: Insufficient documentation

## 2023-02-15 DIAGNOSIS — J8410 Pulmonary fibrosis, unspecified: Secondary | ICD-10-CM | POA: Insufficient documentation

## 2023-02-15 NOTE — Assessment & Plan Note (Signed)
Chronic, LDL goal is less than 70.  She is encouraged to follow heart healthy lifestyle. Clean eating, regular exercise and stress management are all a part of this lifestyle. She is also encouraged to continue with statin therapy.

## 2023-02-15 NOTE — Assessment & Plan Note (Signed)
Chronic uncontrolled.  EKG performed, NSR w/ frequent PACs, RBBB with left axis -bifascicular block, voltage criteria for LVH.  She is currently taking valsartan 320mg  at 12 noon.  She is advised to take amlodipine 5mg  1/2 tab daily. She agrees to rto in 2 weeks for NV.

## 2023-02-15 NOTE — Assessment & Plan Note (Signed)
Chronic, now followed by Pulmonary. She has declined anti-thrombotic therapy. Of note, she is also followed by Rheum, Dr. Dierdre Forth for Sjogren's sndrome.

## 2023-02-15 NOTE — Assessment & Plan Note (Signed)
I will refer her to [facility] for bone density. She is encouraged to engage in weight-bearing exercises at least 2-3 days per week.

## 2023-02-16 ENCOUNTER — Encounter (HOSPITAL_COMMUNITY): Payer: Self-pay

## 2023-02-16 NOTE — Progress Notes (Addendum)
Called patient to see if she was interested in participating in the Pulmonary Rehab Program. Patient stated yes. Patient will come in for orientation on 02/20/23@ 10:30 and will attend the 8:15 exercise class.  Pensions consultant.

## 2023-02-17 ENCOUNTER — Telehealth (HOSPITAL_COMMUNITY): Payer: Self-pay

## 2023-02-17 NOTE — Telephone Encounter (Signed)
Pt insurance is active and benefits verified through [facility]. Co-pay $15.00, DED $0.00/$0.00 met, out of pocket $4,000.00/$117.59 met, co-insurance 0%. No pre-authorization required. [name], 02/17/23 @ 10:13, ZOX#[id number]

## 2023-02-20 ENCOUNTER — Encounter (HOSPITAL_COMMUNITY)
Admission: RE | Admit: 2023-02-20 | Discharge: 2023-02-20 | Disposition: A | Discharge status: Home / Self Care | Payer: Medicare PPO | Source: Ambulatory Visit | Attending: Pulmonary Disease | Admitting: Pulmonary Disease

## 2023-02-20 ENCOUNTER — Encounter (HOSPITAL_COMMUNITY): Payer: Self-pay

## 2023-02-20 VITALS — BP 108/62 | HR 83 | Ht 64.5 in | Wt 166.0 lb

## 2023-02-20 DIAGNOSIS — J849 Interstitial pulmonary disease, unspecified: Secondary | ICD-10-CM | POA: Diagnosis not present

## 2023-02-20 NOTE — Progress Notes (Signed)
Pulmonary Rehab Orientation Physical Assessment Note    Well appearing, A&Ox4, NAD Eyes/Ears: wears glasses, states she is hard of hearing in right ear, tinnitus in left ear at times  Lungs: Clear with no wheezes, rales, rhonchi, denies chronic cough, endorses occasional cough in AM d/t post nasal drip  Heart: Regular rate, no murmurs, no rubs, no clicks Gastrointestinal: abdomin soft, + bowel sounds in all 4 quads, denies recent weight gain or loss, endorses normal BMs Genitourinary: WNL, pt denies s/s Extremities:  +2 pulses, grip strength equal, strong, no edema, no cyanosis, no clubbing Integumentary: pt denies any rashes, open or non healing wounds Psy/Soc: Pt denies. Spouse accompanied pt to appointment today Assistive devices: none

## 2023-02-20 NOTE — Progress Notes (Signed)
Brandy Medina 73 y.o. female Pulmonary Rehab Orientation Note This patient who was referred to Pulmonary Rehab by Dr. Isaiah Serge with the diagnosis of ILD arrived today in Cardiac and Pulmonary Rehab. She  arrived ambulatory with normal gait. She  does not carry portable oxygen. Per patient, Brandy Medina uses oxygen never. Color good, skin warm and dry. Patient is oriented to time and place. Patient's medical history, psychosocial health, and medications reviewed. Psychosocial assessment reveals patient lives with spouse. Brandy Medina is currently retired. Patient hobbies include spending time with others and reading. Patient reports her stress level is low. Areas of stress/anxiety include health. Patient does not exhibit signs of depression. PHQ2/9 score 0/0. Brandy Medina shows good  coping skills with positive outlook on life. Offered emotional support and reassurance. Will continue to monitor. Physical assessment performed by Essie Hart RN. Please see their orientation physical assessment note. Brandy Medina reports she does take medications as prescribed. Patient states she follows a diabetic diet. The patient reports no specific efforts to gain or lose weight.. Patient's weight will be monitored closely. Demonstration and practice of PLB using pulse oximeter. Brandy Medina able to return demonstration satisfactorily. Safety and hand hygiene in the exercise area reviewed with patient. Brandy Medina voices understanding of the information reviewed. Department expectations discussed with patient and achievable goals were set. The patient shows enthusiasm about attending the program and we look forward to working with Brandy Medina. Brandy Medina completed a 6 min walk test today and is scheduled to begin exercise on 02/24/23 at 8:15 am.   [id number] Brandy San, MS, ACSM-CEP

## 2023-02-20 NOTE — Progress Notes (Signed)
Appt with pharmacy team scheduled for 02/24/2023

## 2023-02-20 NOTE — Progress Notes (Signed)
Pulmonary Individual Treatment Plan  Patient Details  Name: Brandy Medina MRN: [id number] Date of Birth: Jul 30, 1950 Referring Provider:   Doristine Devoid Pulmonary Rehab Walk Test from 02/20/2023 in [facility]  Referring Provider Mannam       Initial Encounter Date:  Flowsheet Row Pulmonary Rehab Walk Test from 02/20/2023 in [facility] for Heart, Vascular, & Lung Health  Date 02/20/23       Visit Diagnosis: Interstitial lung disease (HCC)  Patient's Home Medications on Admission:   Current Outpatient Medications:    acetaminophen (TYLENOL) 500 MG tablet, Take 500-1,000 mg by mouth every 6 (six) hours as needed for moderate pain or headache., Disp: , Rfl:    amLODipine (NORVASC) 5 MG tablet, Take 1 tablet (5 mg total) by mouth daily. May take an additional 2.5 mg tablet if systolic blood pressure 160 if needed. (Patient taking differently: Take 2.5 mg by mouth daily. May take an additional 2.5 mg tablet if systolic blood pressure 160 if needed.), Disp: 135 tablet, Rfl: 3   butalbital-acetaminophen-caffeine (FIORICET) 50-325-40 MG tablet, Take 1 tablet by mouth 2 (two) times daily as needed for migraine., Disp: , Rfl:    chlorpheniramine (CHLOR-TRIMETON) 4 MG tablet, Take 4 mg by mouth every 4 (four) hours as needed for allergies., Disp: , Rfl:    Cholecalciferol (VITAMIN D) 50 MCG (2000 UT) tablet, Take 1,000 Units by mouth daily. Tues and Thurs, Disp: , Rfl:    dextromethorphan (DELSYM) 30 MG/5ML liquid, Take 30 mg by mouth 2 (two) times daily as needed for cough., Disp: , Rfl:    empagliflozin (JARDIANCE) 10 MG TABS tablet, Take 1 tablet (10 mg total) by mouth daily before breakfast., Disp: , Rfl:    epoetin alfa (EPOGEN) 10000 UNIT/ML injection, as directed Injection once every 3 weeks for 3 months, Disp: , Rfl:    gabapentin (NEURONTIN) 100 MG capsule, Take 1 capsule (100 mg total) by mouth daily., Disp:  30 capsule, Rfl: 2   Homeopathic Products (ARNICARE PAIN RELIEF EX), Apply 1 application. topically daily as needed (pain)., Disp: , Rfl:    loratadine (CLARITIN) 10 MG tablet, Take 10 mg by mouth daily as needed for allergies., Disp: , Rfl:    Respiratory Therapy Supplies (FLUTTER) DEVI, Use as directed., Disp: 1 each, Rfl: 0   rosuvastatin (CRESTOR) 5 MG tablet, Take 1 tablet (5 mg total) by mouth every Tuesday., Disp: 12 tablet, Rfl: 3   valsartan (DIOVAN) 320 MG tablet, Take 1 tablet (320 mg total) by mouth daily., Disp: 90 tablet, Rfl: 2   Wheat Dextrin (BENEFIBER PO), Take 1 Scoop by mouth daily., Disp: , Rfl:   Current Facility-Administered Medications:    desmopressin (DDAVP) 20.4 mcg in sodium chloride 0.9 % 50 mL IVPB, 0.3 mcg/kg, Intravenous, Once, Baron-Johnson, Alison, PA-C   desmopressin (DDAVP) 20.4 mcg in sodium chloride 0.9 % 50 mL IVPB, 0.3 mcg/kg, Intravenous, Once, Edrick Kins, PA-C  Past Medical History: Past Medical History:  Diagnosis Date   Allergic rhinitis    Diabetes (HCC)    HTN (hypertension)    Migraines    PONV (postoperative nausea and vomiting)    Pulmonary fibrosis (HCC)     Tobacco Use: Social History   Tobacco Use  Smoking Status Former   Current packs/day: 0.00   Average packs/day: 0.5 packs/day for 18.0 years (9.0 ttl pk-yrs)   Types: Cigarettes   Start date: 07/06/1975   Quit date:  07/05/1993   Years since quitting: 29.6  Smokeless Tobacco Never    Labs: Review Flowsheet       Latest Ref Rng & Units 02/03/2023  Labs for ITP Cardiac and Pulmonary Rehab  Cholestrol 100 - 199 mg/dL 161   LDL (calc) 0 - 99 mg/dL 87   HDL-C >09 mg/dL 94   Trlycerides 0 - 604 mg/dL 69   Hemoglobin V4U 4.8 - 5.6 % 6.5     Capillary Blood Glucose: Lab Results  Component Value Date   GLUCAP 97 06/01/2020     Pulmonary Assessment Scores:  Pulmonary Assessment Scores     Row Name 02/20/23 1045         ADL UCSD   ADL Phase Entry     SOB  Score total 19       mMRC Score   mMRC Score 2             UCSD: Self-administered rating of dyspnea associated with activities of daily living (ADLs) 6-point scale (0 = "not at all" to 5 = "maximal or unable to do because of breathlessness")  Scoring Scores range from 0 to 120.  Minimally important difference is 5 units  CAT: CAT can identify the health impairment of COPD patients and is better correlated with disease progression.  CAT has a scoring range of zero to 40. The CAT score is classified into four groups of low (less than 10), medium (10 - 20), high (21-30) and very high (31-40) based on the impact level of disease on health status. A CAT score over 10 suggests significant symptoms.  A worsening CAT score could be explained by an exacerbation, poor medication adherence, poor inhaler technique, or progression of COPD or comorbid conditions.  CAT MCID is 2 points  mMRC: mMRC (Modified Medical Research Council) Dyspnea Scale is used to assess the degree of baseline functional disability in patients of respiratory disease due to dyspnea. No minimal important difference is established. A decrease in score of 1 point or greater is considered a positive change.   Pulmonary Function Assessment:  Pulmonary Function Assessment - 02/20/23 1216       Breath   Bilateral Breath Sounds Clear    Shortness of Breath Yes;Limiting activity             Exercise Target Goals: Exercise Program Goal: Individual exercise prescription set using results from initial 6 min walk test and THRR while considering  patient's activity barriers and safety.   Exercise Prescription Goal: Initial exercise prescription builds to 30-45 minutes a day of aerobic activity, 2-3 days per week.  Home exercise guidelines will be given to patient during program as part of exercise prescription that the participant will acknowledge.  Activity Barriers & Risk Stratification:  Activity Barriers & Cardiac Risk  Stratification - 02/20/23 1108       Activity Barriers & Cardiac Risk Stratification   Activity Barriers Deconditioning;Muscular Weakness;Shortness of Breath;Joint Problems;Back Problems             6 Minute Walk:  6 Minute Walk     Row Name 02/20/23 1211         6 Minute Walk   Phase Initial     Distance 1390 feet     Walk Time 6 minutes     # of Rest Breaks 0     MPH 2.63     METS 3.23     RPE 11     Perceived Dyspnea  1  VO2 Peak 11.3     Symptoms No     Resting HR 83 bpm     Resting BP 108/62     Resting Oxygen Saturation  100 %     Exercise Oxygen Saturation  during 6 min walk 89 %     Max Ex. HR 128 bpm     Max Ex. BP 140/62     2 Minute Post BP 118/64       Interval HR   1 Minute HR 107     2 Minute HR 115     3 Minute HR 120     4 Minute HR 124     5 Minute HR 128     6 Minute HR 126     2 Minute Post HR 88     Interval Heart Rate? Yes       Interval Oxygen   Interval Oxygen? Yes     Baseline Oxygen Saturation % 100 %     1 Minute Oxygen Saturation % 89 %     1 Minute Liters of Oxygen 0 L     2 Minute Oxygen Saturation % 95 %     2 Minute Liters of Oxygen 0 L     3 Minute Oxygen Saturation % 93 %     3 Minute Liters of Oxygen 0 L     4 Minute Oxygen Saturation % 95 %     4 Minute Liters of Oxygen 0 L     5 Minute Oxygen Saturation % 92 %     5 Minute Liters of Oxygen 0 L     6 Minute Oxygen Saturation % 93 %     6 Minute Liters of Oxygen 0 L     2 Minute Post Oxygen Saturation % 96 %     2 Minute Post Liters of Oxygen 0 L              Oxygen Initial Assessment:  Oxygen Initial Assessment - 02/20/23 1102       Home Oxygen   Home Oxygen Device None    Sleep Oxygen Prescription None    Home Exercise Oxygen Prescription None    Home Resting Oxygen Prescription None      Initial 6 min Walk   Oxygen Used None      Program Oxygen Prescription   Program Oxygen Prescription None      Intervention   Short Term Goals To learn  and understand importance of maintaining oxygen saturations>88%;To learn and exhibit compliance with exercise, home and travel O2 prescription;To learn and demonstrate proper use of respiratory medications;To learn and understand importance of monitoring SPO2 with pulse oximeter and demonstrate accurate use of the pulse oximeter.;To learn and demonstrate proper pursed lip breathing techniques or other breathing techniques.     Long  Term Goals Exhibits compliance with exercise, home  and travel O2 prescription;Verbalizes importance of monitoring SPO2 with pulse oximeter and return demonstration;Maintenance of O2 saturations>88%;Exhibits proper breathing techniques, such as pursed lip breathing or other method taught during program session;Compliance with respiratory medication;Demonstrates proper use of MDI's             Oxygen Re-Evaluation:   Oxygen Discharge (Final Oxygen Re-Evaluation):   Initial Exercise Prescription:  Initial Exercise Prescription - 02/20/23 1200       Date of Initial Exercise RX and Referring Provider   Date 02/20/23    Referring Provider Mannam    Expected Discharge Date 05/14/23  Recumbant Elliptical   Level 1    RPM 20    Watts 40    Minutes 15    METs 2.5      Track   Minutes 15    METs 3.2      Prescription Details   Frequency (times per week) 2    Duration Progress to 30 minutes of continuous aerobic without signs/symptoms of physical distress      Intensity   THRR 40-80% of Max Heartrate 59-118    Ratings of Perceived Exertion 11-13    Perceived Dyspnea 0-4      Progression   Progression Continue to progress workloads to maintain intensity without signs/symptoms of physical distress.      Resistance Training   Training Prescription Yes    Weight red bands    Reps 10-15             Perform Capillary Blood Glucose checks as needed.  Exercise Prescription Changes:   Exercise Comments:   Exercise Goals and Review:    Exercise Goals     Row Name 02/20/23 1045             Exercise Goals   Increase Physical Activity Yes       Intervention Provide advice, education, support and counseling about physical activity/exercise needs.;Develop an individualized exercise prescription for aerobic and resistive training based on initial evaluation findings, risk stratification, comorbidities and participant's personal goals.       Expected Outcomes Short Term: Attend rehab on a regular basis to increase amount of physical activity.;Long Term: Exercising regularly at least 3-5 days a week.;Long Term: Add in home exercise to make exercise part of routine and to increase amount of physical activity.       Increase Strength and Stamina Yes       Intervention Provide advice, education, support and counseling about physical activity/exercise needs.;Develop an individualized exercise prescription for aerobic and resistive training based on initial evaluation findings, risk stratification, comorbidities and participant's personal goals.       Expected Outcomes Short Term: Increase workloads from initial exercise prescription for resistance, speed, and METs.;Short Term: Perform resistance training exercises routinely during rehab and add in resistance training at home;Long Term: Improve cardiorespiratory fitness, muscular endurance and strength as measured by increased METs and functional capacity ( )       Able to understand and use rate of perceived exertion (RPE) scale Yes       Intervention Provide education and explanation on how to use RPE scale       Expected Outcomes Short Term: Able to use RPE daily in rehab to express subjective intensity level;Long Term:  Able to use RPE to guide intensity level when exercising independently       Able to understand and use Dyspnea scale Yes       Intervention Provide education and explanation on how to use Dyspnea scale       Expected Outcomes Short Term: Able to use Dyspnea scale  daily in rehab to express subjective sense of shortness of breath during exertion;Long Term: Able to use Dyspnea scale to guide intensity level when exercising independently       Knowledge and understanding of Target Heart Rate Range (THRR) Yes       Intervention Provide education and explanation of THRR including how the numbers were predicted and where they are located for reference       Expected Outcomes Short Term: Able to state/look up THRR;Long Term: Able  to use THRR to govern intensity when exercising independently;Short Term: Able to use daily as guideline for intensity in rehab       Understanding of Exercise Prescription Yes       Intervention Provide education, explanation, and written materials on patient's individual exercise prescription       Expected Outcomes Short Term: Able to explain program exercise prescription;Long Term: Able to explain home exercise prescription to exercise independently                Exercise Goals Re-Evaluation :   Discharge Exercise Prescription (Final Exercise Prescription Changes):   Nutrition:  Target Goals: Understanding of nutrition guidelines, daily intake of sodium 1500mg , cholesterol 200mg , calories 30% from fat and 7% or less from saturated fats, daily to have 5 or more servings of fruits and vegetables.  Biometrics:  Pre Biometrics - 02/20/23 1215       Pre Biometrics   Grip Strength 19 kg              Nutrition Therapy Plan and Nutrition Goals:   Nutrition Assessments:  MEDIFICTS Score Key: >=70 Need to make dietary changes  40-70 Heart Healthy Diet <= 40 Therapeutic Level Cholesterol Diet   Picture Your Plate Scores: <45 Unhealthy dietary pattern with much room for improvement. 41-50 Dietary pattern unlikely to meet recommendations for good health and room for improvement. 51-60 More healthful dietary pattern, with some room for improvement.  >60 Healthy dietary pattern, although there may be some specific  behaviors that could be improved.    Nutrition Goals Re-Evaluation:   Nutrition Goals Discharge (Final Nutrition Goals Re-Evaluation):   Psychosocial: Target Goals: Acknowledge presence or absence of significant depression and/or stress, maximize coping skills, provide positive support system. Participant is able to verbalize types and ability to use techniques and skills needed for reducing stress and depression.  Initial Review & Psychosocial Screening:  Initial Psych Review & Screening - 02/20/23 1046       Initial Review   Current issues with None Identified      Family Dynamics   Good Support System? Yes    Comments spouse, daughter, and son      Barriers   Psychosocial barriers to participate in program There are no identifiable barriers or psychosocial needs.             Quality of Life Scores:  Scores of 19 and below usually indicate a poorer quality of life in these areas.  A difference of  2-3 points is a clinically meaningful difference.  A difference of 2-3 points in the total score of the Quality of Life Index has been associated with significant improvement in overall quality of life, self-image, physical symptoms, and general health in studies assessing change in quality of life.  PHQ-9: Review Flowsheet       02/20/2023 02/03/2023  Depression screen PHQ 2/9  Decreased Interest 0 0  Down, Depressed, Hopeless 0 0  PHQ - 2 Score 0 0  Altered sleeping 0 0  Tired, decreased energy 0 0  Change in appetite 0 0  Feeling bad or failure about yourself  0 0  Trouble concentrating 0 0  Moving slowly or fidgety/restless 0 0  Suicidal thoughts 0 0  PHQ-9 Score 0 0  Difficult doing work/chores Not difficult at all Not difficult at all   Interpretation of Total Score  Total Score Depression Severity:  1-4 = Minimal depression, 5-9 = Mild depression, 10-14 = Moderate depression, 15-19 = Moderately  severe depression, 20-27 = Severe depression   Psychosocial  Evaluation and Intervention:  Psychosocial Evaluation - 02/20/23 1046       Psychosocial Evaluation & Interventions   Interventions Encouraged to exercise with the program and follow exercise prescription    Comments Terrina denies any psychosocial barriers at this time.    Expected Outcomes For Sanye to participate in rehab free of psychosocial concerns.    Continue Psychosocial Services  No Follow up required             Psychosocial Re-Evaluation:   Psychosocial Discharge (Final Psychosocial Re-Evaluation):   Education: Education Goals: Education classes will be provided on a weekly basis, covering required topics. Participant will state understanding/return demonstration of topics presented.  Learning Barriers/Preferences:  Learning Barriers/Preferences - 02/20/23 1047       Learning Barriers/Preferences   Learning Barriers Sight   wears glasses   Learning Preferences None             Education Topics: Know Your Numbers Group instruction that is supported by a PowerPoint presentation. Instructor discusses importance of knowing and understanding resting, exercise, and post-exercise oxygen saturation, heart rate, and blood pressure. Oxygen saturation, heart rate, blood pressure, rating of perceived exertion, and dyspnea are reviewed along with a normal range for these values.    Exercise for the Pulmonary Patient Group instruction that is supported by a PowerPoint presentation. Instructor discusses benefits of exercise, core components of exercise, frequency, duration, and intensity of an exercise routine, importance of utilizing pulse oximetry during exercise, safety while exercising, and options of places to exercise outside of rehab.    MET Level  Group instruction provided by PowerPoint, verbal discussion, and written material to support subject matter. Instructor reviews what METs are and how to increase METs.    Pulmonary Medications Verbally interactive  group education provided by instructor with focus on inhaled medications and proper administration.   Anatomy and Physiology of the Respiratory System Group instruction provided by PowerPoint, verbal discussion, and written material to support subject matter. Instructor reviews respiratory cycle and anatomical components of the respiratory system and their functions. Instructor also reviews differences in obstructive and restrictive respiratory diseases with examples of each.    Oxygen Safety Group instruction provided by PowerPoint, verbal discussion, and written material to support subject matter. There is an overview of "What is Oxygen" and "Why do we need it".  Instructor also reviews how to create a safe environment for oxygen use, the importance of using oxygen as prescribed, and the risks of noncompliance. There is a brief discussion on traveling with oxygen and resources the patient may utilize.   Oxygen Use Group instruction provided by PowerPoint, verbal discussion, and written material to discuss how supplemental oxygen is prescribed and different types of oxygen supply systems. Resources for more information are provided.    Breathing Techniques Group instruction that is supported by demonstration and informational handouts. Instructor discusses the benefits of pursed lip and diaphragmatic breathing and detailed demonstration on how to perform both.     Risk Factor Reduction Group instruction that is supported by a PowerPoint presentation. Instructor discusses the definition of a risk factor, different risk factors for pulmonary disease, and how the heart and lungs work together.   Pulmonary Diseases Group instruction provided by PowerPoint, verbal discussion, and written material to support subject matter. Instructor gives an overview of the different type of pulmonary diseases. There is also a discussion on risk factors and symptoms as well as ways to  manage the  diseases.   Stress and Energy Conservation Group instruction provided by PowerPoint, verbal discussion, and written material to support subject matter. Instructor gives an overview of stress and the impact it can have on the body. Instructor also reviews ways to reduce stress. There is also a discussion on energy conservation and ways to conserve energy throughout the day.   Warning Signs and Symptoms Group instruction provided by PowerPoint, verbal discussion, and written material to support subject matter. Instructor reviews warning signs and symptoms of stroke, heart attack, cold and flu. Instructor also reviews ways to prevent the spread of infection.   Other Education Group or individual verbal, written, or video instructions that support the educational goals of the pulmonary rehab program.    Knowledge Questionnaire Score:  Knowledge Questionnaire Score - 02/20/23 1100       Knowledge Questionnaire Score   Pre Score 16/18             Core Components/Risk Factors/Patient Goals at Admission:  Personal Goals and Risk Factors at Admission - 02/20/23 1047       Core Components/Risk Factors/Patient Goals on Admission   Improve shortness of breath with ADL's Yes    Intervention Provide education, individualized exercise plan and daily activity instruction to help decrease symptoms of SOB with activities of daily living.    Expected Outcomes Short Term: Improve cardiorespiratory fitness to achieve a reduction of symptoms when performing ADLs;Long Term: Be able to perform more ADLs without symptoms or delay the onset of symptoms             Core Components/Risk Factors/Patient Goals Review:    Core Components/Risk Factors/Patient Goals at Discharge (Final Review):    ITP Comments: Dr. Mechele Collin is Medical Director for Pulmonary Rehab at [facility].

## 2023-02-23 ENCOUNTER — Other Ambulatory Visit: Payer: Medicare PPO | Admitting: Pharmacist

## 2023-02-24 ENCOUNTER — Ambulatory Visit: Payer: Medicare PPO | Admitting: Pharmacist

## 2023-02-24 ENCOUNTER — Encounter (HOSPITAL_COMMUNITY): Payer: Medicare PPO

## 2023-02-24 NOTE — Progress Notes (Addendum)
Subjective:  Patient seen in person today by [facility] Pulmonary pharmacy team for Ofev counseling. Patient was last seen by Dr. Isaiah Serge on 02/10/2023. She is accompanied by her husband Fayrene Fearing today.  Pertinent past medical history includes ILD, pulmonary fibrosis, chronic rhinitis, aortic atherosclerosis, HTN, Sjogren's syndrome, T2DM, history of stroke, and iron deficiency anemia .   Prior therapy includes: Esbriet (in 2022)  History of CAD: Yes History of MI: No Current anticoagulant use: No History of HTN: Yes  History of elevated LFTs: No History of diarrhea, nausea, vomiting: No  During patient visit, she expressed significance reluctance to start Ofev due to experiences on Esbriet. While on Esbriet her appetite diminished significantly. She lost 30 pounds while on this medication. Even after Esbriet was discontinued her appetite was slow to return and is still not back to baseline. She expressed concerns about possible side effect of hypertension, she has only recently been able to control her blood pressure. She is also concerned about possible side effect of headaches as an adverse effect.   Patient has completed first session of pulmonary rehab  Objective: Allergies  Allergen Reactions   Avelox [Moxifloxacin Hcl In Nacl] Other (See Comments)    confusion   Avelox [Moxifloxacin] Other (See Comments)   Azathioprine Other (See Comments)   Codeine Nausea Only and Other (See Comments)   Erythromycin     GI upset   Fexofenadine Other (See Comments)   Levocetirizine Other (See Comments)   Lisinopril     Headache   Sulfa Antibiotics Other (See Comments)   Sulfonamide Derivatives     headache   Zyrtec [Cetirizine] Other (See Comments)    Outpatient Encounter Medications as of 02/24/2023  Medication Sig   acetaminophen (TYLENOL) 500 MG tablet Take 500-1,000 mg by mouth every 6 (six) hours as needed for moderate pain or headache.   amLODipine (NORVASC) 5 MG tablet Take 1 tablet  (5 mg total) by mouth daily. May take an additional 2.5 mg tablet if systolic blood pressure 160 if needed. (Patient taking differently: Take 2.5 mg by mouth daily. May take an additional 2.5 mg tablet if systolic blood pressure 160 if needed.)   butalbital-acetaminophen-caffeine (FIORICET) 50-325-40 MG tablet Take 1 tablet by mouth 2 (two) times daily as needed for migraine.   chlorpheniramine (CHLOR-TRIMETON) 4 MG tablet Take 4 mg by mouth every 4 (four) hours as needed for allergies.   Cholecalciferol (VITAMIN D) 50 MCG (2000 UT) tablet Take 1,000 Units by mouth daily. Tues and Thurs   dextromethorphan (DELSYM) 30 MG/5ML liquid Take 30 mg by mouth 2 (two) times daily as needed for cough.   empagliflozin (JARDIANCE) 10 MG TABS tablet Take 1 tablet (10 mg total) by mouth daily before breakfast.   epoetin alfa (EPOGEN) 10000 UNIT/ML injection as directed Injection once every 3 weeks for 3 months   gabapentin (NEURONTIN) 100 MG capsule Take 1 capsule (100 mg total) by mouth daily.   Homeopathic Products (ARNICARE PAIN RELIEF EX) Apply 1 application. topically daily as needed (pain).   loratadine (CLARITIN) 10 MG tablet Take 10 mg by mouth daily as needed for allergies.   Respiratory Therapy Supplies (FLUTTER) DEVI Use as directed.   rosuvastatin (CRESTOR) 5 MG tablet Take 1 tablet (5 mg total) by mouth every Tuesday.   valsartan (DIOVAN) 320 MG tablet Take 1 tablet (320 mg total) by mouth daily.   Wheat Dextrin (BENEFIBER PO) Take 1 Scoop by mouth daily.   Facility-Administered Encounter Medications as of 02/24/2023  Medication   desmopressin (DDAVP) 20.4 mcg in sodium chloride 0.9 % 50 mL IVPB   desmopressin (DDAVP) 20.4 mcg in sodium chloride 0.9 % 50 mL IVPB     Immunization History  Administered Date(s) Administered   Fluad Quad(high Dose 65+) 10/07/2022   H1N1 03/27/2008   Influenza Split 01/09/2012, 11/29/2012, 09/28/2014, 10/24/2019   Influenza, High Dose Seasonal PF 10/29/2018    Influenza,inj,Quad PF,6+ Mos 10/02/2015   PFIZER(Purple Top)SARS-COV-2 Vaccination 03/07/2019, 04/01/2019, 10/25/2019   PNEUMOCOCCAL CONJUGATE-20 02/10/2023   Pfizer(Comirnaty)Fall Seasonal Vaccine 12 years and older 10/03/2022   Pneumococcal Conjugate-13 09/19/2014   Pneumococcal Polysaccharide-23 10/02/2015   Zoster Recombinant(Shingrix) 03/18/2018, 07/15/2018      PFT's TLC  Date Value Ref Range Status  02/10/2023 5.81 L Final      CMP     Component Value Date/Time   NA 135 02/05/2023 0736   NA 134 02/03/2023 1537   NA 133 (L) 11/20/2015 1608   K 4.4 02/05/2023 0736   K 4.2 11/20/2015 1608   CL 103 02/05/2023 0736   CO2 27 02/05/2023 0736   CO2 24 11/20/2015 1608   GLUCOSE 97 02/05/2023 0736   GLUCOSE 90 11/20/2015 1608   BUN 27 (H) 02/05/2023 0736   BUN 17 02/03/2023 1537   BUN 25.3 11/20/2015 1608   CREATININE 1.00 02/05/2023 0736   CREATININE 1.0 11/20/2015 1608   CALCIUM 9.7 02/05/2023 0736   CALCIUM 10.5 (H) 11/20/2015 1608   PROT 9.5 (H) 02/05/2023 0736   PROT 9.4 (H) 02/03/2023 1537   PROT 10.3 (H) 11/20/2015 1608   ALBUMIN 3.8 02/05/2023 0736   ALBUMIN 4.1 02/03/2023 1537   ALBUMIN 3.6 11/20/2015 1608   AST 22 02/05/2023 0736   AST 19 11/20/2015 1608   ALT 15 02/05/2023 0736   ALT 13 11/20/2015 1608   ALKPHOS 57 02/05/2023 0736   ALKPHOS 98 11/20/2015 1608   BILITOT 0.5 02/05/2023 0736   BILITOT 0.33 11/20/2015 1608   GFRNONAA 60 (L) 02/05/2023 0736    CBC    Component Value Date/Time   WBC 3.3 (L) 02/05/2023 0736   WBC 4.2 04/06/2020 0934   RBC 3.09 (L) 02/05/2023 0736   HGB 9.7 (L) 02/05/2023 0736   HGB 9.8 (L) 02/05/2022 1633   HGB 9.5 (L) 11/20/2015 1608   HCT 29.5 (L) 02/05/2023 0736   HCT 29.5 (L) 02/05/2022 1633   HCT 27.7 (L) 11/20/2015 1608   PLT 256 02/05/2023 0736   PLT 150 02/05/2022 1633   MCV 95.5 02/05/2023 0736   MCV 93 02/05/2022 1633   MCV 92.3 11/20/2015 1608   MCH 31.4 02/05/2023 0736   MCHC 32.9 02/05/2023 0736    RDW 15.0 02/05/2023 0736   RDW 15.1 02/05/2022 1633   RDW 14.3 11/20/2015 1608   LYMPHSABS 0.9 02/05/2023 0736   LYMPHSABS 0.9 02/05/2022 1633   LYMPHSABS 1.0 11/20/2015 1608   MONOABS 0.4 02/05/2023 0736   MONOABS 0.4 11/20/2015 1608   EOSABS 0.1 02/05/2023 0736   EOSABS 0.0 02/05/2022 1633   BASOSABS 0.0 02/05/2023 0736   BASOSABS 0.0 02/05/2022 1633   BASOSABS 0.0 11/20/2015 1608    LFT's    Latest Ref Rng & Units 02/05/2023    7:36 AM 02/03/2023    3:37 PM 08/04/2022    8:11 AM  Hepatic Function  Total Protein 6.5 - 8.1 g/dL 9.5  9.4  9.8   Albumin 3.5 - 5.0 g/dL 3.8  4.1  3.7   AST 15 - 41 U/L 22  25  22   ALT 0 - 44 U/L 15  16  16    Alk Phosphatase 38 - 126 U/L 57  70  55   Total Bilirubin 0.0 - 1.2 mg/dL 0.5  0.5  0.5     HRCT (01/11/2023) IMPRESSION: 1. Continue progression of severe pulmonary fibrosis. Findings are consistent with UIP per consensus guidelines: Diagnosis of Idiopathic Pulmonary Fibrosis: An Official ATS/ERS/JRS/ALAT Clinical Practice Guideline. Am Rosezetta Schlatter Crit Care Med Vol 198, Iss 5, [id number], Sep 27 2016. 2.  Aortic atherosclerosis (ICD10-I70.0). 3.  Emphysema (ICD10-J43.9).  Assessment and Plan  Ofev Medication Management Thoroughly counseled patient on the efficacy, mechanism of action, dosing, administration, adverse effects, and monitoring parameters of Ofev. Patient verbalized understanding.  Goals of Therapy: Will not stop or reverse the progression of ILD. It will slow the progression of ILD.  Inhibits tyrosine kinase inhibitors which slow the fibrosis/progression of ILD -Significant reduction in the rate of disease progression was observed after treatment (61.1% [before] vs 33.3% [after], P?=?0.008) over 42 weeks.  Dosing: 150 mg (one capsule) by mouth twice daily (approx 12 hours apart). Discussed taking with food approximately 12 hours apart. Discussed that capsule should not be crushed or split.  We discussed high probability of  starting patient on low-dose Ofev of 100mg  twice daily due to her history of medication sensitivity  Adverse Effects: Nausea, vomiting, diarrhea (2 in 3 patients) appetite loss, weight loss - management of diarrhea with loperamide discussed including max use of 48 hours and max of 8 capsules per day. Abdominal pain (up to 1 in 5 patients) Nasopharyngitis (13%), UTI (6%) Risk of thrombosis (3%) and acute MI (2%) Hypertension (5%) Dizziness Fatigue (10%)  Monitoring: Monitor for diarrhea, nausea and vomiting, GI perforation, hepatotoxicity  Monitor LFTs - baseline, monthly for first 6 months, then every 3 months routinely CBC w differential at baseline and every 3 months routinely  Patient requested we consult her cardiologist, Dr. Servando Salina, and her hematologist, Dr. Pamelia Hoit, in regards to any hesitation from their end starting Ofev. Staff message to both providers was sent by Chesley Mires, PharmD, CPP. Patient is specifically interested in completing pulmonary rehab before initiating antifibrotic. She would like input from cardiology and hematology prior to her making decision a few months down the road.  Medication Reconciliation A drug regimen assessment was performed, including review of allergies, interactions, disease-state management, dosing and immunization history. Medications were reviewed with the patient, including name, instructions, indication, goals of therapy, potential side effects, importance of adherence, and safe use.  Anticoagulant use: No  Reviewed that there is no interaction with Jardiance which she anticipates starting for heart and reno-protection.   Sofie Rower, PharmD [facility] PGY-1  Chesley Mires, PharmD, MPH, BCPS, CPP Clinical Pharmacist (Rheumatology and Pulmonology)

## 2023-02-26 ENCOUNTER — Encounter (HOSPITAL_COMMUNITY)
Admission: RE | Admit: 2023-02-26 | Discharge: 2023-02-26 | Disposition: A | Discharge status: Home / Self Care | Payer: Medicare PPO | Source: Ambulatory Visit | Attending: Pulmonary Disease | Admitting: Pulmonary Disease

## 2023-02-26 DIAGNOSIS — J849 Interstitial pulmonary disease, unspecified: Secondary | ICD-10-CM | POA: Diagnosis not present

## 2023-02-26 LAB — GLUCOSE, CAPILLARY
Glucose-Capillary: 97 mg/dL (ref 70–99)
Glucose-Capillary: 74 mg/dL (ref 70–99)

## 2023-02-26 NOTE — Progress Notes (Signed)
Daily Session Note  Patient Details  Name: Brandy Medina MRN: [id number] Date of Birth: 10-17-1950 Referring Provider:   Doristine Devoid Pulmonary Rehab Walk Test from 02/20/2023 in [facility]  Referring Provider Mannam       Encounter Date: 02/26/2023  Check In:  Session Check In - 02/26/23 0825       Check-In   Supervising physician immediately available to respond to emergencies [facility] MD immediately available    Physician(s) Edd Fabian, NP    Location [facility]    Staff Present Essie Hart, RN, Doris Cheadle, MS, ACSM-CEP, Exercise Physiologist;Randi Dionisio Paschal, ACSM-CEP, Exercise Physiologist;Victoriya Pol Katrinka Blazing, RT    Virtual Visit No    Medication changes reported     No    Fall or balance concerns reported    No    Tobacco Cessation No Change    Warm-up and Cool-down Performed as group-led instruction    Resistance Training Performed Yes    VAD Patient? No    PAD/SET Patient? No      Pain Assessment   Currently in Pain? No/denies    Multiple Pain Sites No             Capillary Blood Glucose: Results for orders placed or performed during the hospital encounter of 02/26/23 (from the past 24 hours)  Glucose, capillary     Status: None   Collection Time: 02/26/23  9:34 AM  Result Value Ref Range   Glucose-Capillary 74 70 - 99 mg/dL      Social History   Tobacco Use  Smoking Status Former   Current packs/day: 0.00   Average packs/day: 0.5 packs/day for 18.0 years (9.0 ttl pk-yrs)   Types: Cigarettes   Start date: 07/06/1975   Quit date: 07/05/1993   Years since quitting: 29.6  Smokeless Tobacco Never    Goals Met:  Proper associated with RPD/PD & O2 Sat Independence with exercise equipment Exercise tolerated well No report of concerns or symptoms today Strength training completed today  Goals Unmet:  Not Applicable  Comments: Service time is from 0807 to 0940.    Dr. Mechele Collin is Medical Director for Pulmonary Rehab at [facility].

## 2023-03-02 DIAGNOSIS — D638 Anemia in other chronic diseases classified elsewhere: Secondary | ICD-10-CM | POA: Diagnosis not present

## 2023-03-02 DIAGNOSIS — M542 Cervicalgia: Secondary | ICD-10-CM | POA: Diagnosis not present

## 2023-03-02 DIAGNOSIS — Z6827 Body mass index (BMI) 27.0-27.9, adult: Secondary | ICD-10-CM | POA: Diagnosis not present

## 2023-03-02 DIAGNOSIS — M3501 Sicca syndrome with keratoconjunctivitis: Secondary | ICD-10-CM | POA: Diagnosis not present

## 2023-03-02 DIAGNOSIS — G5793 Unspecified mononeuropathy of bilateral lower limbs: Secondary | ICD-10-CM | POA: Diagnosis not present

## 2023-03-02 DIAGNOSIS — M25511 Pain in right shoulder: Secondary | ICD-10-CM | POA: Diagnosis not present

## 2023-03-02 DIAGNOSIS — J849 Interstitial pulmonary disease, unspecified: Secondary | ICD-10-CM | POA: Diagnosis not present

## 2023-03-02 DIAGNOSIS — M5134 Other intervertebral disc degeneration, thoracic region: Secondary | ICD-10-CM | POA: Diagnosis not present

## 2023-03-02 DIAGNOSIS — D890 Polyclonal hypergammaglobulinemia: Secondary | ICD-10-CM | POA: Diagnosis not present

## 2023-03-03 ENCOUNTER — Ambulatory Visit: Payer: Medicare PPO | Admitting: Internal Medicine

## 2023-03-03 ENCOUNTER — Encounter (HOSPITAL_COMMUNITY)
Admission: RE | Admit: 2023-03-03 | Discharge: 2023-03-03 | Disposition: A | Discharge status: Home / Self Care | Payer: Medicare PPO | Source: Ambulatory Visit | Attending: Pulmonary Disease | Admitting: Pulmonary Disease

## 2023-03-03 ENCOUNTER — Encounter: Payer: Self-pay | Admitting: Internal Medicine

## 2023-03-03 VITALS — BP 128/80 | HR 79 | Temp 98.3°F | Wt 164.6 lb

## 2023-03-03 VITALS — Wt 161.6 lb

## 2023-03-03 DIAGNOSIS — B029 Zoster without complications: Secondary | ICD-10-CM | POA: Diagnosis not present

## 2023-03-03 DIAGNOSIS — J849 Interstitial pulmonary disease, unspecified: Secondary | ICD-10-CM | POA: Diagnosis not present

## 2023-03-03 LAB — GLUCOSE, CAPILLARY
Glucose-Capillary: 105 mg/dL — ABNORMAL HIGH (ref 70–99)
Glucose-Capillary: 74 mg/dL (ref 70–99)

## 2023-03-03 MED ORDER — VALACYCLOVIR HCL 1 G PO TABS: ORAL_TABLET | ORAL | 0 refills | Status: DC

## 2023-03-03 NOTE — Patient Instructions (Addendum)
 Shingles  Shingles, or herpes zoster, is an infection. It gives you a skin rash and blisters. These infected areas may hurt a lot. Shingles only happens if: You've had chickenpox. You've been given a shot called a vaccine to protect you from getting chickenpox. Shingles is rare in this case. What are the causes? Shingles is caused by a germ called the varicella-zoster virus. This is the same germ that causes chickenpox. After you're exposed to the germ, it stays in your body but is dormant. This means it isn't active. Shingles happens if the germ becomes active again. This can happen years after you're first exposed to the germ. What increases the risk? You may be more likely to get shingles if: You're older than 73 years of age. You're under a lot of stress. You have a weak immune system. The immune system is your body's defense system. It may be weak if: You have human immunodeficiency virus (HIV). You have acquired immunodeficiency syndrome (AIDS). You have cancer. You take medicines that weaken your immune system. These include organ transplant medicines. What are the signs or symptoms? The first symptoms of shingles may be itching, tingling, or pain. Your skin may feel like it's burning. A few days or weeks later, you'll get a rash. Here's what you can expect: The rash is likely to be on one side of your body. The rash may be shaped like a belt or a band. Over time, it will turn into blisters filled with fluid. The blisters will break open and change into scabs. The scabs will dry up in about 2-3 weeks. You may also have: A fever. Chills. A headache. Nausea. How is this diagnosed? Shingles is diagnosed with a skin exam. A sample called a culture may be taken from one of your blisters and sent to a lab. This will show if you have shingles. How is this treated? The rash may last for several weeks. There's no cure for shingles, but your health care provider may give you medicines.  These medicines may: Help with pain. Help with itching. Help with irritation and swelling. Help you get better sooner. Help to prevent long-term problems. If the rash is on your face, you may need to see an eye doctor or an ear, nose, and throat (ENT) doctor. Follow these instructions at home: Medicines Take your medicines only as told by your provider. Put an anti-itch cream or numbing cream on the rash or blisters as told by your provider. Relieving itching and discomfort  To help with itching: Put cold, wet cloths called cold compresses on the rash or blisters. Take a cool bath. Try adding baking soda or dry oatmeal to the water. Do not bathe in hot water. Use calamine lotion on the rash or blisters. You can get this type of lotion at the store. Blister and rash care Keep your rash covered with a loose bandage. Wear loose clothes that don't rub on your rash. Take care of your rash as told by your provider. Make sure you: Wash your hands with soap and water for at least 20 seconds before and after you change your bandage. If you can't use soap and water, use hand sanitizer. Keep your rash and blisters clean by washing them with mild soap and cool water. Change your bandage. Check your rash every day for signs of infection. Check for: More redness, swelling, or pain. Fluid or blood. Warmth. Pus or a bad smell. Do not scratch your rash. Do not pick at your  blisters. To help you not scratch: Keep your fingernails clean and cut short. Try to wear gloves or mittens when you sleep. General instructions Rest. Wash your hands often with soap and water for at least 20 seconds. If you can't use soap and water, use hand sanitizer. Washing your hands lowers your chance of getting a skin infection. Your infection can cause chickenpox in others. If you have blisters that aren't scabs yet, stay away from: Babies. Pregnant people. Children who have eczema. Older people who have organ  transplants. People who have a long-term, or chronic, illness. Anyone who hasn't had chickenpox before. Anyone who hasn't gotten the chickenpox vaccine. How is this prevented? Vaccines are the best way to prevent you from getting chickenpox or shingles. Talk with your provider about getting these shots. Where to find more information Centers for Disease Control and Prevention (CDC): TonerProm[employer].no Contact a health care provider if: Your pain doesn't get better with medicine. Your pain doesn't get better after the rash heals. You have any signs of infection around the rash. Your rash or blisters get worse. You have a fever or chills. Get help right away if: The rash is on your face or nose. You have pain in your face or by your eye. You lose feeling on one side of your face. You have trouble seeing. You have ear pain or ringing in your ear. This information is not intended to replace advice given to you by your health care provider. Make sure you discuss any questions you have with your health care provider. Document Revised: 10/16/2022 Document Reviewed: 02/28/2022 Elsevier Patient Education  2024 [employer].

## 2023-03-03 NOTE — Progress Notes (Signed)
 Daily Session Note  Patient Details  Name: ROBI MITTER MRN: [id number] Date of Birth: 1950/03/23 Referring Provider:   Conrad Ports Pulmonary Rehab Walk Test from 02/20/2023 in [facility]  Referring Provider Mannam       Encounter Date: 03/03/2023  Check In:  Session Check In - 03/03/23 0817       Check-In   Supervising physician immediately available to respond to emergencies [facility] MD immediately available    Physician(s) Josefa Beauvais, NP    Location [facility]    Staff Present Johnnie Moats, MS, ACSM-CEP, Exercise Physiologist;Randi Midge HECKLE, ACSM-CEP, Exercise Physiologist;Joanie Duprey Claudene, RT    Virtual Visit No    Medication changes reported     No    Fall or balance concerns reported    No    Tobacco Cessation No Change    Warm-up and Cool-down Performed as group-led instruction    Resistance Training Performed Yes    VAD Patient? No    PAD/SET Patient? No      Pain Assessment   Currently in Pain? No/denies    Multiple Pain Sites No             Capillary Blood Glucose: Results for orders placed or performed during the hospital encounter of 03/03/23 (from the past 24 hours)  Glucose, capillary     Status: None   Collection Time: 03/03/23  9:18 AM  Result Value Ref Range   Glucose-Capillary 74 70 - 99 mg/dL     Exercise Prescription Changes - 03/03/23 0900       Response to Exercise   Blood Pressure (Admit) 120/72    Blood Pressure (Exercise) 120/76    Blood Pressure (Exit) 126/70    Heart Rate (Admit) 93 bpm    Heart Rate (Exercise) 118 bpm    Heart Rate (Exit) 103 bpm    Oxygen Saturation (Admit) 99 %    Oxygen Saturation (Exercise) 93 %    Oxygen Saturation (Exit) 93 %    Rating of Perceived Exertion (Exercise) 13    Perceived Dyspnea (Exercise) 2    Duration Continue with 30 min of aerobic exercise without signs/symptoms of physical distress.    Intensity THRR unchanged       Progression   Progression Continue to progress workloads to maintain intensity without signs/symptoms of physical distress.      Resistance Training   Training Prescription Yes    Weight red bands    Reps 10-15    Time 10 Minutes      Recumbant Elliptical   Level 1    RPM 57    Minutes 15    METs 3.7      Track   Laps 13    Minutes 15    METs 3             Social History   Tobacco Use  Smoking Status Former   Current packs/day: 0.00   Average packs/day: 0.5 packs/day for 18.0 years (9.0 ttl pk-yrs)   Types: Cigarettes   Start date: 07/06/1975   Quit date: 07/05/1993   Years since quitting: 29.6  Smokeless Tobacco Never    Goals Met:  Proper associated with RPD/PD & O2 Sat Independence with exercise equipment Exercise tolerated well No report of concerns or symptoms today Strength training completed today  Goals Unmet:  Not Applicable  Comments: Service time is from 0805 to 0920.    Dr.  Slater Staff is Medical Director for Pulmonary Rehab at [facility].

## 2023-03-03 NOTE — Progress Notes (Signed)
 I,[name], CMA,acting as a neurosurgeon for [name], MD.,have documented all relevant documentation on the behalf of [name], MD,as directed by  [name], MD while in the presence of [name], MD.  Subjective:  Patient ID: Brandy Medina , female    DOB: 1950-04-17 , 73 y.o.   MRN: [id number]  No chief complaint on file.   HPI  Patient presents today for rash on her lower back. She first noticed this yesterday.  It started shortly after starting Jardiance . She started the medication on the same day.  Rash is described as red and itchy.  No pain.  She spoke with a [facility] pharmacist who told her that the medication can have the side effect of a rash.   She admits that she used 2 expired OTC products at home. Hydrocortisone cream that expired in 2020, along with a antihistamine allergy  pill that expired in Dec. She did get some relief from using these products.        Past Medical History:  Diagnosis Date   Allergic rhinitis    Diabetes (HCC)    HTN (hypertension)    Migraines    PONV (postoperative nausea and vomiting)    Pulmonary fibrosis (HCC)      Family History  Problem Relation Age of Onset   Renal Disease Brother    Stroke Brother    Bronchitis Mother    Heart disease Mother    Heart disease Father    Heart attack Father    Hypertension Sister    Diabetes Sister    Heart attack Brother    Hypertension Sister    Diabetes Sister      Current Outpatient Medications:    acetaminophen  (TYLENOL ) 500 MG tablet, Take 500-1,000 mg by mouth every 6 (six) hours as needed for moderate pain or headache., Disp: , Rfl:    amLODipine  (NORVASC ) 5 MG tablet, Take 1 tablet (5 mg total) by mouth daily. May take an additional 2.5 mg tablet if systolic blood pressure 160 if needed. (Patient taking differently: Take 2.5 mg by mouth daily. May take an additional 2.5 mg tablet if systolic blood pressure 160 if needed.), Disp: 135 tablet, Rfl: 3    butalbital -acetaminophen -caffeine  (FIORICET) 50-325-40 MG tablet, Take 1 tablet by mouth 2 (two) times daily as needed for migraine., Disp: , Rfl:    chlorpheniramine (CHLOR-TRIMETON) 4 MG tablet, Take 4 mg by mouth every 4 (four) hours as needed for allergies., Disp: , Rfl:    Cholecalciferol (VITAMIN D) 50 MCG (2000 UT) tablet, Take 1,000 Units by mouth daily. Tues and Thurs, Disp: , Rfl:    dextromethorphan (DELSYM) 30 MG/5ML liquid, Take 30 mg by mouth 2 (two) times daily as needed for cough., Disp: , Rfl:    empagliflozin  (JARDIANCE ) 10 MG TABS tablet, Take 1 tablet (10 mg total) by mouth daily before breakfast., Disp: , Rfl:    epoetin  alfa (EPOGEN ) 10000 UNIT/ML injection, as directed Injection once every 3 weeks for 3 months, Disp: , Rfl:    Homeopathic Products (ARNICARE PAIN RELIEF EX), Apply 1 application. topically daily as needed (pain)., Disp: , Rfl:    loratadine (CLARITIN) 10 MG tablet, Take 10 mg by mouth daily as needed for allergies., Disp: , Rfl:    Respiratory Therapy Supplies (FLUTTER) DEVI, Use as directed., Disp: 1 each, Rfl: 0   rosuvastatin  (CRESTOR ) 5 MG tablet, Take 1 tablet (5 mg total) by mouth every Tuesday., Disp: 12 tablet, Rfl:  3   valACYclovir  (VALTREX ) 1000 MG tablet, One tab po tid, Disp: 21 tablet, Rfl: 0   valsartan  (DIOVAN ) 320 MG tablet, Take 1 tablet (320 mg total) by mouth daily., Disp: 90 tablet, Rfl: 2   Wheat Dextrin (BENEFIBER PO), Take 1 Scoop by mouth daily., Disp: , Rfl:    gabapentin  (NEURONTIN ) 100 MG capsule, Take 1 capsule (100 mg total) by mouth daily. (Patient not taking: Reported on 03/03/2023), Disp: 30 capsule, Rfl: 2  Current Facility-Administered Medications:    desmopressin  (DDAVP ) 20.4 mcg in sodium chloride  0.9 % 50 mL IVPB, 0.3 mcg/kg, Intravenous, Once, Baron-Johnson, Alison, PA-C   desmopressin  (DDAVP ) 20.4 mcg in sodium chloride  0.9 % 50 mL IVPB, 0.3 mcg/kg, Intravenous, Once, Baron-Johnson, Alison, PA-C   Allergies  Allergen  Reactions   Avelox [Moxifloxacin Hcl In Nacl] Other (See Comments)    confusion   Avelox [Moxifloxacin] Other (See Comments)   Azathioprine  Other (See Comments)   Codeine Nausea Only and Other (See Comments)   Erythromycin     GI upset   Fexofenadine Other (See Comments)   Levocetirizine Other (See Comments)   Lisinopril     Headache   Sulfa Antibiotics Other (See Comments)   Sulfonamide Derivatives     headache   Zyrtec [Cetirizine] Other (See Comments)     Review of Systems  Constitutional: Negative.   Respiratory: Negative.    Cardiovascular: Negative.   Skin:  Positive for rash.  Neurological: Negative.      Today's Vitals   03/03/23 1511  BP: 128/80  Pulse: 79  Temp: 98.3 F (36.8 C)  SpO2: 98%  Weight: 164 lb 9.6 oz (74.7 kg)   Body mass index is 27.82 kg/m.  Wt Readings from Last 3 Encounters:  03/03/23 161 lb 9.6 oz (73.3 kg)  03/03/23 164 lb 9.6 oz (74.7 kg)  02/20/23 166 lb 0.1 oz (75.3 kg)     Objective:  Physical Exam Vitals and nursing note reviewed.  Constitutional:      Appearance: Normal appearance.  HENT:     Head: Normocephalic and atraumatic.  Eyes:     Extraocular Movements: Extraocular movements intact.  Cardiovascular:     Rate and Rhythm: Normal rate and regular rhythm.     Heart sounds: Normal heart sounds.  Pulmonary:     Effort: Pulmonary effort is normal.     Breath sounds: Normal breath sounds.  Musculoskeletal:     Cervical back: Normal range of motion.  Skin:    General: Skin is warm.     Findings: Rash present.     Comments: Papular rash on erythematous base, one area with vesicular lesion Located on left lower back, doesn't cross the midline  Neurological:     General: No focal deficit present.     Mental Status: She is alert.  Psychiatric:        Mood and Affect: Mood normal.        Behavior: Behavior normal.         Assessment And Plan:  Herpes zoster without complication Assessment & Plan: I will send rx  Valtrex  1gm taken tid. She is encouraged to complete full course. I do not think her sx are related to Jardiance . We both agree that she will hold Jardiance  for at least two weeks before another trial.    Other orders -     valACYclovir  HCl; One tab po tid  Dispense: 21 tablet; Refill: 0     Return if symptoms worsen or  fail to improve.  Patient was given opportunity to ask questions. Patient verbalized understanding of the plan and was able to repeat key elements of the plan. All questions were answered to their satisfaction.   I, [name], MD, have reviewed all documentation for this visit. The documentation on 03/03/23 for the exam, diagnosis, procedures, and orders are all accurate and complete.   IF YOU HAVE BEEN REFERRED TO A SPECIALIST, IT MAY TAKE 1-2 WEEKS TO SCHEDULE/PROCESS THE REFERRAL. IF YOU HAVE NOT HEARD FROM US /SPECIALIST IN TWO WEEKS, PLEASE GIVE US  A CALL AT [phone].   THE PATIENT IS ENCOURAGED TO PRACTICE SOCIAL DISTANCING DUE TO THE COVID-19 PANDEMIC.

## 2023-03-04 ENCOUNTER — Encounter: Payer: Self-pay | Admitting: Internal Medicine

## 2023-03-04 DIAGNOSIS — B029 Zoster without complications: Secondary | ICD-10-CM | POA: Insufficient documentation

## 2023-03-04 NOTE — Assessment & Plan Note (Signed)
 I will send rx Valtrex  1gm taken tid. She is encouraged to complete full course. I do not think her sx are related to Jardiance . We both agree that she will hold Jardiance  for at least two weeks before another trial.

## 2023-03-05 ENCOUNTER — Other Ambulatory Visit: Payer: Self-pay | Admitting: Internal Medicine

## 2023-03-05 ENCOUNTER — Encounter (HOSPITAL_COMMUNITY): Payer: Medicare PPO

## 2023-03-05 ENCOUNTER — Encounter: Payer: Self-pay | Admitting: Pharmacist

## 2023-03-05 ENCOUNTER — Encounter: Payer: Self-pay | Admitting: Internal Medicine

## 2023-03-05 MED ORDER — ACYCLOVIR 5 % EX OINT: 1.0000 | TOPICAL_OINTMENT | CUTANEOUS | 1 refills | Status: DC | PRN

## 2023-03-10 ENCOUNTER — Encounter (HOSPITAL_COMMUNITY): Payer: Medicare PPO

## 2023-03-12 ENCOUNTER — Encounter (HOSPITAL_COMMUNITY): Payer: Medicare PPO

## 2023-03-17 ENCOUNTER — Encounter (HOSPITAL_COMMUNITY): Payer: Medicare PPO

## 2023-03-18 NOTE — Progress Notes (Signed)
 Pulmonary Individual Treatment Plan  Patient Details  Name: Brandy Medina MRN: [id number] Date of Birth: 27-Sep-1950 Referring Provider:   Conrad Ports Pulmonary Rehab Walk Test from 02/20/2023 in [facility]  Referring Provider Mannam       Initial Encounter Date:  Flowsheet Row Pulmonary Rehab Walk Test from 02/20/2023 in [facility]  Date 02/20/23       Visit Diagnosis: Interstitial lung disease (HCC)  Patient's Home Medications on Admission:   Current Outpatient Medications:    acetaminophen  (TYLENOL ) 500 MG tablet, Take 500-1,000 mg by mouth every 6 (six) hours as needed for moderate pain or headache., Disp: , Rfl:    acyclovir  ointment (ZOVIRAX ) 5 %, Apply 1 Application topically every 4 (four) hours as needed., Disp: 15 g, Rfl: 1   amLODipine  (NORVASC ) 5 MG tablet, Take 1 tablet (5 mg total) by mouth daily. May take an additional 2.5 mg tablet if systolic blood pressure 160 if needed. (Patient taking differently: Take 2.5 mg by mouth daily. May take an additional 2.5 mg tablet if systolic blood pressure 160 if needed.), Disp: 135 tablet, Rfl: 3   butalbital -acetaminophen -caffeine  (FIORICET) 50-325-40 MG tablet, Take 1 tablet by mouth 2 (two) times daily as needed for migraine., Disp: , Rfl:    chlorpheniramine (CHLOR-TRIMETON) 4 MG tablet, Take 4 mg by mouth every 4 (four) hours as needed for allergies., Disp: , Rfl:    Cholecalciferol (VITAMIN D) 50 MCG (2000 UT) tablet, Take 1,000 Units by mouth daily. Tues and Thurs, Disp: , Rfl:    dextromethorphan (DELSYM) 30 MG/5ML liquid, Take 30 mg by mouth 2 (two) times daily as needed for cough., Disp: , Rfl:    empagliflozin  (JARDIANCE ) 10 MG TABS tablet, Take 1 tablet (10 mg total) by mouth daily before breakfast., Disp: , Rfl:    epoetin  alfa (EPOGEN ) 10000 UNIT/ML injection, as directed Injection once every 3 weeks for  3 months, Disp: , Rfl:    gabapentin  (NEURONTIN ) 100 MG capsule, Take 1 capsule (100 mg total) by mouth daily. (Patient not taking: Reported on 03/03/2023), Disp: 30 capsule, Rfl: 2   Homeopathic Products (ARNICARE PAIN RELIEF EX), Apply 1 application. topically daily as needed (pain)., Disp: , Rfl:    loratadine (CLARITIN) 10 MG tablet, Take 10 mg by mouth daily as needed for allergies., Disp: , Rfl:    Respiratory Therapy Supplies (FLUTTER) DEVI, Use as directed., Disp: 1 each, Rfl: 0   rosuvastatin  (CRESTOR ) 5 MG tablet, Take 1 tablet (5 mg total) by mouth every Tuesday., Disp: 12 tablet, Rfl: 3   valACYclovir  (VALTREX ) 1000 MG tablet, One tab po tid, Disp: 21 tablet, Rfl: 0   valsartan  (DIOVAN ) 320 MG tablet, Take 1 tablet (320 mg total) by mouth daily., Disp: 90 tablet, Rfl: 2   Wheat Dextrin (BENEFIBER PO), Take 1 Scoop by mouth daily., Disp: , Rfl:   Current Facility-Administered Medications:    desmopressin  (DDAVP ) 20.4 mcg in sodium chloride  0.9 % 50 mL IVPB, 0.3 mcg/kg, Intravenous, Once, Baron-Johnson, Alison, PA-C   desmopressin  (DDAVP ) 20.4 mcg in sodium chloride  0.9 % 50 mL IVPB, 0.3 mcg/kg, Intravenous, Once, Nancy Pizza, PA-C  Past Medical History: Past Medical History:  Diagnosis Date   Allergic rhinitis    Diabetes (HCC)    HTN (hypertension)    Migraines    PONV (postoperative nausea and vomiting)    Pulmonary fibrosis (HCC)  Tobacco Use: Social History   Tobacco Use  Smoking Status Former   Current packs/day: 0.00   Average packs/day: 0.5 packs/day for 18.0 years (9.0 ttl pk-yrs)   Types: Cigarettes   Start date: 07/06/1975   Quit date: 07/05/1993   Years since quitting: 29.7  Smokeless Tobacco Never    Labs: Review Flowsheet       Latest Ref Rng & Units 02/03/2023  Labs for ITP Cardiac and Pulmonary Rehab  Cholestrol 100 - 199 mg/dL 805   LDL (calc) 0 - 99 mg/dL 87   HDL-C >60 mg/dL 94   Trlycerides 0 - 850 mg/dL 69   Hemoglobin J8r 4.8 - 5.6  % 6.5     Capillary Blood Glucose: Lab Results  Component Value Date   GLUCAP 74 03/03/2023   GLUCAP 105 (H) 03/03/2023   GLUCAP 74 02/26/2023   GLUCAP 97 02/26/2023   GLUCAP 97 06/01/2020     Pulmonary Assessment Scores:  Pulmonary Assessment Scores     Row Name 02/20/23 1045 03/18/23 0816       ADL UCSD   ADL Phase Entry --    SOB Score total 19 --      CAT Score   CAT Score -- 7      mMRC Score   mMRC Score 2 --            UCSD: Self-administered rating of dyspnea associated with activities of daily living (ADLs) 6-point scale (0 = not at all to 5 = maximal or unable to do because of breathlessness)  Scoring Scores range from 0 to 120.  Minimally important difference is 5 units  CAT: CAT can identify the health impairment of COPD patients and is better correlated with disease progression.  CAT has a scoring range of zero to 40. The CAT score is classified into four groups of low (less than 10), medium (10 - 20), high (21-30) and very high (31-40) based on the impact level of disease on health status. A CAT score over 10 suggests significant symptoms.  A worsening CAT score could be explained by an exacerbation, poor medication adherence, poor inhaler technique, or progression of COPD or comorbid conditions.  CAT MCID is 2 points  mMRC: mMRC (Modified Medical Research Council) Dyspnea Scale is used to assess the degree of baseline functional disability in patients of respiratory disease due to dyspnea. No minimal important difference is established. A decrease in score of 1 point or greater is considered a positive change.   Pulmonary Function Assessment:  Pulmonary Function Assessment - 02/20/23 1216       Breath   Bilateral Breath Sounds Clear    Shortness of Breath Yes;Limiting activity             Exercise Target Goals: Exercise Program Goal: Individual exercise prescription set using results from initial 6 min walk test and THRR while  considering  patient's activity barriers and safety.   Exercise Prescription Goal: Initial exercise prescription builds to 30-45 minutes a day of aerobic activity, 2-3 days per week.  Home exercise guidelines will be given to patient during program as part of exercise prescription that the participant will acknowledge.  Activity Barriers & Risk Stratification:  Activity Barriers & Cardiac Risk Stratification - 02/20/23 1108       Activity Barriers & Cardiac Risk Stratification   Activity Barriers Deconditioning;Muscular Weakness;Shortness of Breath;Joint Problems;Back Problems             6 Minute Walk:  6  Minute Walk     Row Name 02/20/23 1211         6 Minute Walk   Phase Initial     Distance 1390 feet     Walk Time 6 minutes     # of Rest Breaks 0     MPH 2.63     METS 3.23     RPE 11     Perceived Dyspnea  1     VO2 Peak 11.3     Symptoms No     Resting HR 83 bpm     Resting BP 108/62     Resting Oxygen Saturation  100 %     Exercise Oxygen Saturation  during 6 min walk 89 %     Max Ex. HR 128 bpm     Max Ex. BP 140/62     2 Minute Post BP 118/64       Interval HR   1 Minute HR 107     2 Minute HR 115     3 Minute HR 120     4 Minute HR 124     5 Minute HR 128     6 Minute HR 126     2 Minute Post HR 88     Interval Heart Rate? Yes       Interval Oxygen   Interval Oxygen? Yes     Baseline Oxygen Saturation % 100 %     1 Minute Oxygen Saturation % 89 %     1 Minute Liters of Oxygen 0 L     2 Minute Oxygen Saturation % 95 %     2 Minute Liters of Oxygen 0 L     3 Minute Oxygen Saturation % 93 %     3 Minute Liters of Oxygen 0 L     4 Minute Oxygen Saturation % 95 %     4 Minute Liters of Oxygen 0 L     5 Minute Oxygen Saturation % 92 %     5 Minute Liters of Oxygen 0 L     6 Minute Oxygen Saturation % 93 %     6 Minute Liters of Oxygen 0 L     2 Minute Post Oxygen Saturation % 96 %     2 Minute Post Liters of Oxygen 0 L               Oxygen Initial Assessment:  Oxygen Initial Assessment - 02/20/23 1102       Home Oxygen   Home Oxygen Device None    Sleep Oxygen Prescription None    Home Exercise Oxygen Prescription None    Home Resting Oxygen Prescription None      Initial 6 min Walk   Oxygen Used None      Program Oxygen Prescription   Program Oxygen Prescription None      Intervention   Short Term Goals To learn and understand importance of maintaining oxygen saturations>88%;To learn and exhibit compliance with exercise, home and travel O2 prescription;To learn and demonstrate proper use of respiratory medications;To learn and understand importance of monitoring SPO2 with pulse oximeter and demonstrate accurate use of the pulse oximeter.;To learn and demonstrate proper pursed lip breathing techniques or other breathing techniques.     Long  Term Goals Exhibits compliance with exercise, home  and travel O2 prescription;Verbalizes importance of monitoring SPO2 with pulse oximeter and return demonstration;Maintenance of O2 saturations>88%;Exhibits proper breathing techniques, such as pursed lip breathing  or other method taught during program session;Compliance with respiratory medication;Demonstrates proper use of MDI's             Oxygen Re-Evaluation:  Oxygen Re-Evaluation     Row Name 03/09/23 1115             Program Oxygen Prescription   Program Oxygen Prescription None         Home Oxygen   Home Oxygen Device None       Sleep Oxygen Prescription None       Home Exercise Oxygen Prescription None       Home Resting Oxygen Prescription None         Goals/Expected Outcomes   Short Term Goals To learn and understand importance of maintaining oxygen saturations>88%;To learn and exhibit compliance with exercise, home and travel O2 prescription;To learn and demonstrate proper use of respiratory medications;To learn and understand importance of monitoring SPO2 with pulse oximeter and demonstrate  accurate use of the pulse oximeter.;To learn and demonstrate proper pursed lip breathing techniques or other breathing techniques.        Long  Term Goals Exhibits compliance with exercise, home  and travel O2 prescription;Verbalizes importance of monitoring SPO2 with pulse oximeter and return demonstration;Maintenance of O2 saturations>88%;Exhibits proper breathing techniques, such as pursed lip breathing or other method taught during program session;Compliance with respiratory medication;Demonstrates proper use of MDI's       Goals/Expected Outcomes Compliance and understanding of oxygen saturation monitoring and breathing techniques to decrease shortness of breath.                Oxygen Discharge (Final Oxygen Re-Evaluation):  Oxygen Re-Evaluation - 03/09/23 1115       Program Oxygen Prescription   Program Oxygen Prescription None      Home Oxygen   Home Oxygen Device None    Sleep Oxygen Prescription None    Home Exercise Oxygen Prescription None    Home Resting Oxygen Prescription None      Goals/Expected Outcomes   Short Term Goals To learn and understand importance of maintaining oxygen saturations>88%;To learn and exhibit compliance with exercise, home and travel O2 prescription;To learn and demonstrate proper use of respiratory medications;To learn and understand importance of monitoring SPO2 with pulse oximeter and demonstrate accurate use of the pulse oximeter.;To learn and demonstrate proper pursed lip breathing techniques or other breathing techniques.     Long  Term Goals Exhibits compliance with exercise, home  and travel O2 prescription;Verbalizes importance of monitoring SPO2 with pulse oximeter and return demonstration;Maintenance of O2 saturations>88%;Exhibits proper breathing techniques, such as pursed lip breathing or other method taught during program session;Compliance with respiratory medication;Demonstrates proper use of MDI's    Goals/Expected Outcomes Compliance  and understanding of oxygen saturation monitoring and breathing techniques to decrease shortness of breath.             Initial Exercise Prescription:  Initial Exercise Prescription - 02/20/23 1200       Date of Initial Exercise RX and Referring Provider   Date 02/20/23    Referring Provider Mannam    Expected Discharge Date 05/14/23      Recumbant Elliptical   Level 1    RPM 20    Watts 40    Minutes 15    METs 2.5      Track   Minutes 15    METs 3.2      Prescription Details   Frequency (times per week) 2    Duration Progress  to 30 minutes of continuous aerobic without signs/symptoms of physical distress      Intensity   THRR 40-80% of Max Heartrate 59-118    Ratings of Perceived Exertion 11-13    Perceived Dyspnea 0-4      Progression   Progression Continue to progress workloads to maintain intensity without signs/symptoms of physical distress.      Resistance Training   Training Prescription Yes    Weight red bands    Reps 10-15             Perform Capillary Blood Glucose checks as needed.  Exercise Prescription Changes:   Exercise Prescription Changes     Row Name 03/03/23 0900             Response to Exercise   Blood Pressure (Admit) 120/72       Blood Pressure (Exercise) 120/76       Blood Pressure (Exit) 126/70       Heart Rate (Admit) 93 bpm       Heart Rate (Exercise) 118 bpm       Heart Rate (Exit) 103 bpm       Oxygen Saturation (Admit) 99 %       Oxygen Saturation (Exercise) 93 %       Oxygen Saturation (Exit) 93 %       Rating of Perceived Exertion (Exercise) 13       Perceived Dyspnea (Exercise) 2       Duration Continue with 30 min of aerobic exercise without signs/symptoms of physical distress.       Intensity THRR unchanged         Progression   Progression Continue to progress workloads to maintain intensity without signs/symptoms of physical distress.         Resistance Training   Training Prescription Yes        Weight red bands       Reps 10-15       Time 10 Minutes         Recumbant Elliptical   Level 1       RPM 57       Minutes 15       METs 3.7         Track   Laps 13       Minutes 15       METs 3                Exercise Comments:   Exercise Comments     Row Name 02/26/23 0950           Exercise Comments Shaeley completed her first day of exercise. She exercised for 15 min on the track and recumbent elliptical. Jaielle averaged 2.85 METs on the track and 3.4 METs at level 1 on the recumbent elliptical. She performed the warmup and cooldown standing without limitations. Discussed METs.                Exercise Goals and Review:   Exercise Goals     Row Name 02/20/23 1045             Exercise Goals   Increase Physical Activity Yes       Intervention Provide advice, education, support and counseling about physical activity/exercise needs.;Develop an individualized exercise prescription for aerobic and resistive training based on initial evaluation findings, risk stratification, comorbidities and participant's personal goals.       Expected Outcomes Short Term: Attend rehab on a regular  basis to increase amount of physical activity.;Long Term: Exercising regularly at least 3-5 days a week.;Long Term: Add in home exercise to make exercise part of routine and to increase amount of physical activity.       Increase Strength and Stamina Yes       Intervention Provide advice, education, support and counseling about physical activity/exercise needs.;Develop an individualized exercise prescription for aerobic and resistive training based on initial evaluation findings, risk stratification, comorbidities and participant's personal goals.       Expected Outcomes Short Term: Increase workloads from initial exercise prescription for resistance, speed, and METs.;Short Term: Perform resistance training exercises routinely during rehab and add in resistance training at home;Long Term:  Improve cardiorespiratory fitness, muscular endurance and strength as measured by increased METs and functional capacity ( )       Able to understand and use rate of perceived exertion (RPE) scale Yes       Intervention Provide education and explanation on how to use RPE scale       Expected Outcomes Short Term: Able to use RPE daily in rehab to express subjective intensity level;Long Term:  Able to use RPE to guide intensity level when exercising independently       Able to understand and use Dyspnea scale Yes       Intervention Provide education and explanation on how to use Dyspnea scale       Expected Outcomes Short Term: Able to use Dyspnea scale daily in rehab to express subjective sense of shortness of breath during exertion;Long Term: Able to use Dyspnea scale to guide intensity level when exercising independently       Knowledge and understanding of Target Heart Rate Range (THRR) Yes       Intervention Provide education and explanation of THRR including how the numbers were predicted and where they are located for reference       Expected Outcomes Short Term: Able to state/look up THRR;Long Term: Able to use THRR to govern intensity when exercising independently;Short Term: Able to use daily as guideline for intensity in rehab       Understanding of Exercise Prescription Yes       Intervention Provide education, explanation, and written materials on patient's individual exercise prescription       Expected Outcomes Short Term: Able to explain program exercise prescription;Long Term: Able to explain home exercise prescription to exercise independently                Exercise Goals Re-Evaluation :  Exercise Goals Re-Evaluation     Row Name 03/09/23 1108             Exercise Goal Re-Evaluation   Exercise Goals Review Increase Physical Activity;Able to understand and use Dyspnea scale;Understanding of Exercise Prescription;Increase Strength and Stamina;Knowledge and  understanding of Target Heart Rate Range (THRR);Able to understand and use rate of perceived exertion (RPE) scale       Comments Lindsie has completed 2 exercise sessions. She exercises for 15 min on the track and recumbent elliptical. Reena dresser 3.0 METs on the track and 3.7 METs at level 1 on the recumbent elliptical. She performs the warmup and cooldown standing without limitations. It is too soon to notate any progressions. Evelia is scheduled to be out of rehab for the next couple of weeks. Will continue to monitor and progress as able.       Expected Outcomes Through exercise at rehab and home, the patient will decrease shortness of breath with  daily activities and feel confident in carrying out an exercise regimen at home.                Discharge Exercise Prescription (Final Exercise Prescription Changes):  Exercise Prescription Changes - 03/03/23 0900       Response to Exercise   Blood Pressure (Admit) 120/72    Blood Pressure (Exercise) 120/76    Blood Pressure (Exit) 126/70    Heart Rate (Admit) 93 bpm    Heart Rate (Exercise) 118 bpm    Heart Rate (Exit) 103 bpm    Oxygen Saturation (Admit) 99 %    Oxygen Saturation (Exercise) 93 %    Oxygen Saturation (Exit) 93 %    Rating of Perceived Exertion (Exercise) 13    Perceived Dyspnea (Exercise) 2    Duration Continue with 30 min of aerobic exercise without signs/symptoms of physical distress.    Intensity THRR unchanged      Progression   Progression Continue to progress workloads to maintain intensity without signs/symptoms of physical distress.      Resistance Training   Training Prescription Yes    Weight red bands    Reps 10-15    Time 10 Minutes      Recumbant Elliptical   Level 1    RPM 57    Minutes 15    METs 3.7      Track   Laps 13    Minutes 15    METs 3             Nutrition:  Target Goals: Understanding of nutrition guidelines, daily intake of sodium 1500mg , cholesterol 200mg , calories  30% from fat and 7% or less from saturated fats, daily to have 5 or more servings of fruits and vegetables.  Biometrics:  Pre Biometrics - 02/20/23 1215       Pre Biometrics   Grip Strength 19 kg              Nutrition Therapy Plan and Nutrition Goals:   Nutrition Assessments:  MEDIFICTS Score Key: >=70 Need to make dietary changes  40-70 Heart Healthy Diet <= 40 Therapeutic Level Cholesterol Diet   Picture Your Plate Scores: <59 Unhealthy dietary pattern with much room for improvement. 41-50 Dietary pattern unlikely to meet recommendations for good health and room for improvement. 51-60 More healthful dietary pattern, with some room for improvement.  >60 Healthy dietary pattern, although there may be some specific behaviors that could be improved.    Nutrition Goals Re-Evaluation:   Nutrition Goals Discharge (Final Nutrition Goals Re-Evaluation):   Psychosocial: Target Goals: Acknowledge presence or absence of significant depression and/or stress, maximize coping skills, provide positive support system. Participant is able to verbalize types and ability to use techniques and skills needed for reducing stress and depression.  Initial Review & Psychosocial Screening:  Initial Psych Review & Screening - 02/20/23 1046       Initial Review   Current issues with None Identified      Family Dynamics   Good Support System? Yes    Comments spouse, daughter, and son      Barriers   Psychosocial barriers to participate in program There are no identifiable barriers or psychosocial needs.             Quality of Life Scores:  Scores of 19 and below usually indicate a poorer quality of life in these areas.  A difference of  2-3 points is a clinically meaningful difference.  A difference of  2-3 points in the total score of the Quality of Life Index has been associated with significant improvement in overall quality of life, self-image, physical symptoms, and general  health in studies assessing change in quality of life.  PHQ-9: Review Flowsheet       02/20/2023 02/03/2023  Depression screen PHQ 2/9  Decreased Interest 0 0  Down, Depressed, Hopeless 0 0  PHQ - 2 Score 0 0  Altered sleeping 0 0  Tired, decreased energy 0 0  Change in appetite 0 0  Feeling bad or failure about yourself  0 0  Trouble concentrating 0 0  Moving slowly or fidgety/restless 0 0  Suicidal thoughts 0 0  PHQ-9 Score 0 0  Difficult doing work/chores Not difficult at all Not difficult at all   Interpretation of Total Score  Total Score Depression Severity:  1-4 = Minimal depression, 5-9 = Mild depression, 10-14 = Moderate depression, 15-19 = Moderately severe depression, 20-27 = Severe depression   Psychosocial Evaluation and Intervention:  Psychosocial Evaluation - 02/20/23 1046       Psychosocial Evaluation & Interventions   Interventions Encouraged to exercise with the program and follow exercise prescription    Comments Merci denies any psychosocial barriers at this time.    Expected Outcomes For Velisa to participate in rehab free of psychosocial concerns.    Continue Psychosocial Services  No Follow up required             Psychosocial Re-Evaluation:  Psychosocial Re-Evaluation     Row Name 03/11/23 1416             Psychosocial Re-Evaluation   Current issues with None Identified       Comments At re-eval, Aaminah continues to deny any psy/soc barriers or concerns       Expected Outcomes For Eola to participate in rehab free of psychosocial concerns.       Interventions Encouraged to attend Pulmonary Rehabilitation for the exercise       Continue Psychosocial Services  No Follow up required                Psychosocial Discharge (Final Psychosocial Re-Evaluation):  Psychosocial Re-Evaluation - 03/11/23 1416       Psychosocial Re-Evaluation   Current issues with None Identified    Comments At re-eval, Unity continues to deny any psy/soc  barriers or concerns    Expected Outcomes For Ardice to participate in rehab free of psychosocial concerns.    Interventions Encouraged to attend Pulmonary Rehabilitation for the exercise    Continue Psychosocial Services  No Follow up required             Education: Education Goals: Education classes will be provided on a weekly basis, covering required topics. Participant will state understanding/return demonstration of topics presented.  Learning Barriers/Preferences:  Learning Barriers/Preferences - 02/20/23 1047       Learning Barriers/Preferences   Learning Barriers Sight   wears glasses   Learning Preferences None             Education Topics: Know Your Numbers Group instruction that is supported by a PowerPoint presentation. Instructor discusses importance of knowing and understanding resting, exercise, and post-exercise oxygen saturation, heart rate, and blood pressure. Oxygen saturation, heart rate, blood pressure, rating of perceived exertion, and dyspnea are reviewed along with a normal range for these values.    Exercise for the Pulmonary Patient Group instruction that is supported by a PowerPoint presentation. Instructor discusses benefits of exercise,  core components of exercise, frequency, duration, and intensity of an exercise routine, importance of utilizing pulse oximetry during exercise, safety while exercising, and options of places to exercise outside of rehab.    MET Level  Group instruction provided by PowerPoint, verbal discussion, and written material to support subject matter. Instructor reviews what METs are and how to increase METs.    Pulmonary Medications Verbally interactive group education provided by instructor with focus on inhaled medications and proper administration.   Anatomy and Physiology of the Respiratory System Group instruction provided by PowerPoint, verbal discussion, and written material to support subject matter.  Instructor reviews respiratory cycle and anatomical components of the respiratory system and their functions. Instructor also reviews differences in obstructive and restrictive respiratory diseases with examples of each.    Oxygen Safety Group instruction provided by PowerPoint, verbal discussion, and written material to support subject matter. There is an overview of "What is Oxygen" and "Why do we need it".  Instructor also reviews how to create a safe environment for oxygen use, the importance of using oxygen as prescribed, and the risks of noncompliance. There is a brief discussion on traveling with oxygen and resources the patient may utilize.   Oxygen Use Group instruction provided by PowerPoint, verbal discussion, and written material to discuss how supplemental oxygen is prescribed and different types of oxygen supply systems. Resources for more information are provided.    Breathing Techniques Group instruction that is supported by demonstration and informational handouts. Instructor discusses the benefits of pursed lip and diaphragmatic breathing and detailed demonstration on how to perform both.     Risk Factor Reduction Group instruction that is supported by a PowerPoint presentation. Instructor discusses the definition of a risk factor, different risk factors for pulmonary disease, and how the heart and lungs work together.   Pulmonary Diseases Group instruction provided by PowerPoint, verbal discussion, and written material to support subject matter. Instructor gives an overview of the different type of pulmonary diseases. There is also a discussion on risk factors and symptoms as well as ways to manage the diseases.   Stress and Energy Conservation Group instruction provided by PowerPoint, verbal discussion, and written material to support subject matter. Instructor gives an overview of stress and the impact it can have on the body. Instructor also reviews ways to reduce  stress. There is also a discussion on energy conservation and ways to conserve energy throughout the day. Flowsheet Row PULMONARY REHAB OTHER RESPIRATORY from 02/26/2023 in [facility]  Date 02/26/23  Educator RN  Instruction Review Code 1- Verbalizes Understanding       Warning Signs and Symptoms Group instruction provided by PowerPoint, verbal discussion, and written material to support subject matter. Instructor reviews warning signs and symptoms of stroke, heart attack, cold and flu. Instructor also reviews ways to prevent the spread of infection.   Other Education Group or individual verbal, written, or video instructions that support the educational goals of the pulmonary rehab program.    Knowledge Questionnaire Score:  Knowledge Questionnaire Score - 02/20/23 1100       Knowledge Questionnaire Score   Pre Score 16/18             Core Components/Risk Factors/Patient Goals at Admission:  Personal Goals and Risk Factors at Admission - 02/20/23 1047       Core Components/Risk Factors/Patient Goals on Admission   Improve shortness of breath with ADL's Yes    Intervention  Provide education, individualized exercise plan and daily activity instruction to help decrease symptoms of SOB with activities of daily living.    Expected Outcomes Short Term: Improve cardiorespiratory fitness to achieve a reduction of symptoms when performing ADLs;Long Term: Be able to perform more ADLs without symptoms or delay the onset of symptoms             Core Components/Risk Factors/Patient Goals Review:   Goals and Risk Factor Review     Row Name 03/11/23 1417             Core Components/Risk Factors/Patient Goals Review   Personal Goals Review Improve shortness of breath with ADL's;Develop more efficient breathing techniques such as purse lipped breathing and diaphragmatic breathing and practicing self-pacing with  activity.       Review Unable to assess goals. Maegan has completed 2 sessions so far.       Expected Outcomes For Janayah to improve her shortness of breath with ADLs and develop more efficient breathing techniques such as purse lipped breathing and diaphragmatic breathing; and practicing self-pacing with activity                Core Components/Risk Factors/Patient Goals at Discharge (Final Review):   Goals and Risk Factor Review - 03/11/23 1417       Core Components/Risk Factors/Patient Goals Review   Personal Goals Review Improve shortness of breath with ADL's;Develop more efficient breathing techniques such as purse lipped breathing and diaphragmatic breathing and practicing self-pacing with activity.    Review Unable to assess goals. Erna has completed 2 sessions so far.    Expected Outcomes For Aneka to improve her shortness of breath with ADLs and develop more efficient breathing techniques such as purse lipped breathing and diaphragmatic breathing; and practicing self-pacing with activity             ITP Comments: Pt is making expected progress toward Pulmonary Rehab goals after completing 2 session(s). Recommend continued exercise, life style modification, education, and utilization of breathing techniques to increase stamina and strength, while also decreasing shortness of breath with exertion.  Dr. Slater Staff is Medical Director for Pulmonary Rehab at [facility].

## 2023-03-19 ENCOUNTER — Encounter (HOSPITAL_COMMUNITY): Payer: Medicare PPO

## 2023-03-20 ENCOUNTER — Ambulatory Visit: Payer: Self-pay | Admitting: Internal Medicine

## 2023-03-20 NOTE — Telephone Encounter (Signed)
 Copied from CRM [id number]. Topic: Clinical - Medical Advice >> Mar 20, 2023  9:48 AM Dondra Prader A wrote: Reason for CRM: Patient states for the last three days her stomach has been feeling queasy. Patient states she finished taking Shingles medication a week ago.  Patient states that she is not in any pain, and no fever. She did not want to take Pepto bismol due to it having a blood thinning agent in the medication. She would like to know what to do or take.  [phone]

## 2023-03-20 NOTE — Telephone Encounter (Signed)
  Chief Complaint: Nausea Symptoms: nausea Frequency: About 3 days Pertinent Negatives: Patient denies fever, pain Disposition: [] ED /[] Urgent Care (no appt availability in office) / [] Appointment(In office/virtual)/ []  [facility]/ [x] Home Care/ [] Refused Recommended Disposition /[] [facility]/ []  Follow-up with PCP Additional Notes: Pt reports she completed Shingles treatment about 1 week ago, she notes nausea for the last 3 days. Pt denies fever, pain, V/D. Home care advised, pt asking if okay to take Pepto, Advised okay to take per instructions on bottle. Pt notes nausea is improving today. This RN educated pt on home care, new-worsening symptoms, when to call back/seek emergent care. Pt verbalized understanding and agrees to plan.   Reason for Disposition  Unexplained nausea  Answer Assessment - Initial Assessment Questions 1. NAUSEA SEVERITY: "How bad is the nausea?" (e.g., mild, moderate, severe; dehydration, weight loss)   - MILD: loss of appetite without change in eating habits   - MODERATE: decreased oral intake without significant weight loss, dehydration, or malnutrition   - SEVERE: inadequate caloric or fluid intake, significant weight loss, symptoms of dehydration     Improving 2. ONSET: "When did the nausea begin?"     About 3 days 3. VOMITING: "Any vomiting?" If Yes, ask: "How many times today?"     None  5. CAUSE: "What do you think is causing the nausea?"     Pt completed medication for Shingles about 1 week ago  Protocols used: Nausea-A-AH

## 2023-03-23 ENCOUNTER — Encounter: Payer: Self-pay | Admitting: Neurology

## 2023-03-23 ENCOUNTER — Ambulatory Visit: Payer: Medicare PPO | Admitting: Neurology

## 2023-03-23 ENCOUNTER — Telehealth: Payer: Self-pay

## 2023-03-23 VITALS — BP 153/90 | HR 93 | Ht 64.5 in | Wt 159.0 lb

## 2023-03-23 DIAGNOSIS — R202 Paresthesia of skin: Secondary | ICD-10-CM | POA: Diagnosis not present

## 2023-03-23 DIAGNOSIS — G5603 Carpal tunnel syndrome, bilateral upper limbs: Secondary | ICD-10-CM | POA: Diagnosis not present

## 2023-03-23 NOTE — Telephone Encounter (Signed)
 I returned patient's call in regards to her having nausea. I was going to offer patient an appointment with Ellender Hose FNP-C at 11;40am. I was unable to leave pt vm. YL,RMA

## 2023-03-23 NOTE — Progress Notes (Signed)
 Follow-up Visit   Date: 03/23/2023    Brandy Medina MRN: [id number] DOB: 1950/05/12    Brandy Medina is a 73 y.o. right-handed female with  Sjogren's syndrome, idiopathic pulmonary fibrosis, hypertension, hyperlipidemia, and diet-controlled diabetes returning to the clinic for follow-up of bilateral hand and feet numbness/tingling.  The patient was accompanied to the clinic by self.    IMPRESSION/PLAN: Bilateral carpal syndrome, worse on the right.  Symptoms have improved with wearing wrist splints.   Bilateral feet paresthesias.  NCS/EMG showed no neuropathy, old R S1 radiculopathy.With her history of Sjogren's disease, small fiber neuropathy is possible.   Refer for skin biopsy of the right side with Dr. Loleta Chance.  She did not tolerate gabapentin (headache).   Return to clinic, if no improvement  --------------------------------------------- History of present illness: Starting around summer 2023, she began having sensation as if she is stepping on stones/sticks involving the balls of the feet and toes.  There is no associated pain.  Symptoms are slightly worse on the right.  She also complains of intermittent numbness of the fingertips. Symptoms are improved if she wears a wrist brace.  This occurs infrequently, possibly once every few months.  Repositioning and stretching helps alleviate it.  She denies weakness of the arms and legs.  Prior smoker.  Socially drinks alcohol.  She lives at home with husband.  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  Diabetes is diet controlled.   UPDATE 10/09/2022:  She is here for EDX of the hands and right leg.  She continues to have abnormal sensation over the feet and numbness in the hands.  Numbness is improved when she wear a brace.  No new complaints.   UPDATE 03/23/2023:  She is here for follow-up visit.  She has been compliant with using wrist braces, which has helped her hand tingling.  For her feet numbness/tingling, her PCP offered gabapentin, but she  stopped this after developing headaches on it. Tingling in the feet is constant.   In late January, she had shingles involving the right thigh which has healed. She did not develop pain.    Medications:  Current Outpatient Medications on File Prior to Visit  Medication Sig Dispense Refill   acetaminophen (TYLENOL) 500 MG tablet Take 500-1,000 mg by mouth every 6 (six) hours as needed for moderate pain or headache.     amLODipine (NORVASC) 5 MG tablet Take 1 tablet (5 mg total) by mouth daily. May take an additional 2.5 mg tablet if systolic blood pressure 160 if needed. (Patient taking differently: Take 2.5 mg by mouth daily.) 135 tablet 3   butalbital-acetaminophen-caffeine (FIORICET) 50-325-40 MG tablet Take 1 tablet by mouth 2 (two) times daily as needed for migraine.     chlorpheniramine (CHLOR-TRIMETON) 4 MG tablet Take 4 mg by mouth every 4 (four) hours as needed for allergies.     Cholecalciferol (VITAMIN D) 50 MCG (2000 UT) tablet Take 1,000 Units by mouth daily. Tues and Thurs     dextromethorphan (DELSYM) 30 MG/5ML liquid Take 30 mg by mouth 2 (two) times daily as needed for cough.     empagliflozin (JARDIANCE) 10 MG TABS tablet Take 1 tablet (10 mg total) by mouth daily before breakfast.     epoetin alfa (EPOGEN) 10000 UNIT/ML injection as directed Injection once every 3 weeks for 3 months     Homeopathic Products (ARNICARE PAIN RELIEF EX) Apply 1 application. topically daily as needed (pain).     loratadine (CLARITIN) 10 MG tablet Take  10 mg by mouth daily as needed for allergies.     Respiratory Therapy Supplies (FLUTTER) DEVI Use as directed. 1 each 0   rosuvastatin (CRESTOR) 5 MG tablet Take 1 tablet (5 mg total) by mouth every Tuesday. 12 tablet 3   valsartan (DIOVAN) 320 MG tablet Take 1 tablet (320 mg total) by mouth daily. 90 tablet 2   Wheat Dextrin (BENEFIBER PO) Take 1 Scoop by mouth daily.     acyclovir ointment (ZOVIRAX) 5 % Apply 1 Application topically every 4 (four)  hours as needed. 15 g 1   gabapentin (NEURONTIN) 100 MG capsule Take 1 capsule (100 mg total) by mouth daily. (Patient not taking: Reported on 03/03/2023) 30 capsule 2   valACYclovir (VALTREX) 1000 MG tablet One tab po tid 21 tablet 0   Current Facility-Administered Medications on File Prior to Visit  Medication Dose Route Frequency Provider Last Rate Last Admin   desmopressin (DDAVP) 20.4 mcg in sodium chloride 0.9 % 50 mL IVPB  0.3 mcg/kg Intravenous Once Edrick Kins, PA-C       desmopressin (DDAVP) 20.4 mcg in sodium chloride 0.9 % 50 mL IVPB  0.3 mcg/kg Intravenous Once Edrick Kins, PA-C        Allergies:  Allergies  Allergen Reactions   Avelox [Moxifloxacin Hcl In Nacl] Other (See Comments)    confusion   Avelox [Moxifloxacin] Other (See Comments)   Azathioprine Other (See Comments)   Codeine Nausea Only and Other (See Comments)   Erythromycin     GI upset   Fexofenadine Other (See Comments)   Levocetirizine Other (See Comments)   Lisinopril     Headache   Sulfa Antibiotics Other (See Comments)   Sulfonamide Derivatives     headache   Zyrtec [Cetirizine] Other (See Comments)    Vital Signs:  BP (!) 153/90 Comment: Hasn't taken blood pressure medication yet  Pulse 93   Ht 5' 4.5" (1.638 m)   Wt 159 lb (72.1 kg)   SpO2 99%   BMI 26.87 kg/m    Neurological Exam: MENTAL STATUS including orientation to time, place, person, recent and remote memory, attention span and concentration, language, and fund of knowledge is normal.  Speech is not dysarthric.  CRANIAL NERVES: No visual field defects.  Pupils equal round and reactive to light.  Normal conjugate, extra-ocular eye movements in all directions of gaze.  No ptosis. Normal facial sensation.  Face is symmetric. Palate elevates symmetrically.  Tongue is midline.  MOTOR:  Motor strength is 5/5 in all extremities.  No atrophy, fasciculations or abnormal movements.  No pronator drift.  Tone is normal.     MSRs:  Reflexes are 2+/4 throughout, except ankle jerks.  SENSORY:  Intact to temperature and vibration throughout.  COORDINATION/GAIT:  Normal finger-to- nose-finger and heel-to-shin.  Intact rapid alternating movements bilaterally.  Gait narrow based and stable.    Data: NCS/EMG of the right side and left arm 10/09/2022: Bilateral median neuropathy at or distal to the wrist, consistent with a clinical diagnosis of carpal tunnel syndrome.  Overall, these findings are moderate on the right, and mild on the left. Chronic S1 radiculopathy affecting the right lower extremity, mild. There is no evidence of a large fiber sensorimotor polyneuropathy affecting the right side.  Thank you for allowing me to participate in patient's care.  If I can answer any additional questions, I would be pleased to do so.    Sincerely,    Hulon Ferron K. Allena Katz, DO

## 2023-03-23 NOTE — Patient Instructions (Signed)
 We will have you set up for skin biopsy

## 2023-03-24 ENCOUNTER — Encounter (HOSPITAL_COMMUNITY)
Admission: RE | Admit: 2023-03-24 | Discharge: 2023-03-24 | Disposition: A | Discharge status: Home / Self Care | Payer: Medicare PPO | Source: Ambulatory Visit | Attending: Pulmonary Disease | Admitting: Pulmonary Disease

## 2023-03-24 DIAGNOSIS — J849 Interstitial pulmonary disease, unspecified: Secondary | ICD-10-CM | POA: Diagnosis not present

## 2023-03-24 DIAGNOSIS — E2839 Other primary ovarian failure: Secondary | ICD-10-CM | POA: Diagnosis not present

## 2023-03-24 LAB — HM DEXA SCAN: HM Dexa Scan: NORMAL

## 2023-03-24 NOTE — Progress Notes (Signed)
 Daily Session Note  Patient Details  Name: Brandy Medina MRN: [id number] Date of Birth: 11/13/1950 Referring Provider:   Doristine Devoid Pulmonary Rehab Walk Test from 02/20/2023 in [facility] for Heart, Vascular, & Lung Health  Referring Provider Mannam       Encounter Date: 03/24/2023  Check In:  Session Check In - 03/24/23 6962       Check-In   Supervising physician immediately available to respond to emergencies [facility] MD immediately available    Physician(s) Robin Searing, NP    Location [facility]    Staff Present Raford Pitcher, MS, ACSM-CEP, Exercise Physiologist;[name] Katrinka Blazing, RT    Virtual Visit No    Medication changes reported     No    Fall or balance concerns reported    No    Tobacco Cessation No Change    Warm-up and Cool-down Performed as group-led instruction    Resistance Training Performed Yes    VAD Patient? No    PAD/SET Patient? No      Pain Assessment   Currently in Pain? No/denies    Multiple Pain Sites No             Capillary Blood Glucose: No results found for this or any previous visit (from the past 24 hours).    Social History   Tobacco Use  Smoking Status Former   Current packs/day: 0.00   Average packs/day: 0.5 packs/day for 18.0 years (9.0 ttl pk-yrs)   Types: Cigarettes   Start date: 07/06/1975   Quit date: 07/05/1993   Years since quitting: 29.7  Smokeless Tobacco Never    Goals Met:  Proper associated with RPD/PD & O2 Sat Independence with exercise equipment Exercise tolerated well No report of concerns or symptoms today Strength training completed today  Goals Unmet:  Not Applicable  Comments: Service time is from 0810 to 0929.    Dr. Mechele Collin is Medical Director for Pulmonary Rehab at [facility].

## 2023-03-26 ENCOUNTER — Encounter: Payer: Self-pay | Admitting: Internal Medicine

## 2023-03-26 ENCOUNTER — Encounter (HOSPITAL_COMMUNITY)
Admission: RE | Admit: 2023-03-26 | Discharge: 2023-03-26 | Disposition: A | Discharge status: Home / Self Care | Payer: Medicare PPO | Source: Ambulatory Visit | Attending: Pulmonary Disease | Admitting: Pulmonary Disease

## 2023-03-26 ENCOUNTER — Ambulatory Visit: Payer: Self-pay | Admitting: Family Medicine

## 2023-03-26 DIAGNOSIS — J849 Interstitial pulmonary disease, unspecified: Secondary | ICD-10-CM

## 2023-03-26 NOTE — Progress Notes (Signed)
 Daily Session Note  Patient Details  Name: Brandy Medina MRN: [id number] Date of Birth: 1950-04-23 Referring Provider:   Doristine Devoid Pulmonary Rehab Walk Test from 02/20/2023 in [facility] for [facility], Vascular, & Lung Health  Referring Provider Mannam       Encounter Date: 03/26/2023  Check In:  Session Check In - 03/26/23 6213       Check-In   Supervising physician immediately available to respond to emergencies [facility] MD immediately available    Physician(s) Bernadene Person, NP    Location [facility]    Staff Present Raford Pitcher, MS, ACSM-CEP, Exercise Physiologist;Elfreda Blanchet Synthia Innocent, RN, BSN;Randi Reeve BS, ACSM-CEP, Exercise Physiologist;Samantha Belarus, RD, LDN    Virtual Visit No    Medication changes reported     No    Fall or balance concerns reported    No    Tobacco Cessation No Change    Warm-up and Cool-down Performed as group-led instruction    Resistance Training Performed Yes    VAD Patient? No    PAD/SET Patient? No      Pain Assessment   Currently in Pain? No/denies             Capillary Blood Glucose: No results found for this or any previous visit (from the past 24 hours).    Social History   Tobacco Use  Smoking Status Former   Current packs/day: 0.00   Average packs/day: 0.5 packs/day for 18.0 years (9.0 ttl pk-yrs)   Types: Cigarettes   Start date: 07/06/1975   Quit date: 07/05/1993   Years since quitting: 29.7  Smokeless Tobacco Never    Goals Met:  Proper associated with RPD/PD & O2 Sat Independence with exercise equipment Exercise tolerated well No report of concerns or symptoms today Strength training completed today  Goals Unmet:  Not Applicable  Comments: Service time is from 0805 to 0934.    Dr. Mechele Collin is Medical Director for Pulmonary Rehab at [facility].

## 2023-03-30 ENCOUNTER — Ambulatory Visit: Payer: Medicare PPO | Admitting: Internal Medicine

## 2023-03-31 ENCOUNTER — Encounter (HOSPITAL_COMMUNITY)
Admission: RE | Admit: 2023-03-31 | Discharge: 2023-03-31 | Disposition: A | Discharge status: Home / Self Care | Payer: Medicare PPO | Source: Ambulatory Visit | Attending: Pulmonary Disease | Admitting: Pulmonary Disease

## 2023-03-31 VITALS — Wt 158.7 lb

## 2023-03-31 DIAGNOSIS — J849 Interstitial pulmonary disease, unspecified: Secondary | ICD-10-CM | POA: Diagnosis not present

## 2023-03-31 NOTE — Progress Notes (Signed)
 Daily Session Note  Patient Details  Name: Brandy Medina MRN: [id number] Date of Birth: May 20, 1950 Referring Provider:   Doristine Devoid Pulmonary Rehab Walk Test from 02/20/2023 in [facility] for Heart, Vascular, & Lung Health  Referring Provider Mannam       Encounter Date: 03/31/2023  Check In:  Session Check In - 03/31/23 0818       Check-In   Supervising physician immediately available to respond to emergencies [facility] MD immediately available    Physician(s) Bernadene Person, NP    Location [facility]    Staff Present Raford Pitcher, MS, ACSM-CEP, Exercise Physiologist;Casey Synthia Innocent, RN, BSN;Randi Reeve BS, ACSM-CEP, Exercise Physiologist    Virtual Visit No    Medication changes reported     No    Fall or balance concerns reported    No    Tobacco Cessation No Change    Warm-up and Cool-down Performed as group-led instruction    Resistance Training Performed Yes    VAD Patient? No    PAD/SET Patient? No      Pain Assessment   Currently in Pain? No/denies    Multiple Pain Sites No             Capillary Blood Glucose: No results found for this or any previous visit (from the past 24 hours).   Exercise Prescription Changes - 03/31/23 0900       Response to Exercise   Blood Pressure (Admit) 112/70    Blood Pressure (Exercise) 130/70    Blood Pressure (Exit) 100/68    Heart Rate (Admit) 79 bpm    Heart Rate (Exercise) 123 bpm    Heart Rate (Exit) 98 bpm    Oxygen Saturation (Admit) 98 %    Oxygen Saturation (Exercise) 94 %    Oxygen Saturation (Exit) 99 %    Rating of Perceived Exertion (Exercise) 13    Perceived Dyspnea (Exercise) 3    Duration Continue with 30 min of aerobic exercise without signs/symptoms of physical distress.    Intensity THRR unchanged      Progression   Progression Continue to progress workloads to maintain intensity without signs/symptoms of physical distress.      Resistance Training    Training Prescription Yes    Weight red bands    Reps 10-15    Time 10 Minutes      Treadmill   MPH 2.3    Grade 1    Minutes 15    METs 2.9      Recumbant Elliptical   Level 2    Minutes 15    METs 3.7             Social History   Tobacco Use  Smoking Status Former   Current packs/day: 0.00   Average packs/day: 0.5 packs/day for 18.0 years (9.0 ttl pk-yrs)   Types: Cigarettes   Start date: 07/06/1975   Quit date: 07/05/1993   Years since quitting: 29.7  Smokeless Tobacco Never    Goals Met:  Proper associated with RPD/PD & O2 Sat Exercise tolerated well No report of concerns or symptoms today Strength training completed today  Goals Unmet:  Not Applicable  Comments: Service time is from 0810 to 0925.    Dr. Mechele Collin is Medical Director for Pulmonary Rehab at [facility].

## 2023-03-31 NOTE — Progress Notes (Signed)
 Called patients insurance [phone]. Spoke to [employer] and authorization for CPT [id number] & [id number] not required. Call reference number: [id number].

## 2023-04-02 ENCOUNTER — Encounter (HOSPITAL_COMMUNITY)
Admission: RE | Admit: 2023-04-02 | Discharge: 2023-04-02 | Disposition: A | Discharge status: Home / Self Care | Payer: Medicare PPO | Source: Ambulatory Visit | Attending: Pulmonary Disease | Admitting: Pulmonary Disease

## 2023-04-02 VITALS — Wt 159.0 lb

## 2023-04-02 DIAGNOSIS — J849 Interstitial pulmonary disease, unspecified: Secondary | ICD-10-CM

## 2023-04-02 DIAGNOSIS — K5900 Constipation, unspecified: Secondary | ICD-10-CM | POA: Diagnosis not present

## 2023-04-02 NOTE — Progress Notes (Signed)
 Daily Session Note  Patient Details  Name: JURNEI LATINI MRN: [id number] Date of Birth: [birth year] Referring Provider:   Doristine Devoid Pulmonary Rehab Walk Test from 02/20/2023 in [facility] for Heart, Vascular, & Lung Health  Referring Provider Mannam       Encounter Date: 04/02/2023  Check In:  Session Check In - 04/02/23 0827       Check-In   Supervising physician immediately available to respond to emergencies [facility] MD immediately available    Physician(s) Robin Searing, NP    Location [facility]    Staff Present Raford Pitcher, MS, ACSM-CEP, Exercise Physiologist;Casey Synthia Innocent, RN, BSN;Randi Reeve BS, ACSM-CEP, Exercise Physiologist    Virtual Visit No    Medication changes reported     No    Fall or balance concerns reported    No    Tobacco Cessation No Change    Warm-up and Cool-down Performed as group-led instruction    Resistance Training Performed Yes    VAD Patient? No    PAD/SET Patient? No      Pain Assessment   Currently in Pain? No/denies             Capillary Blood Glucose: No results found for this or any previous visit (from the past 24 hours).    Social History   Tobacco Use  Smoking Status Former   Current packs/day: 0.00   Average packs/day: 0.5 packs/day for 18.0 years (9.0 ttl pk-yrs)   Types: Cigarettes   Start date: 07/06/1975   Quit date: 07/05/1993   Years since quitting: 29.7  Smokeless Tobacco Never    Goals Met:  Proper associated with RPD/PD & O2 Sat Exercise tolerated well No report of concerns or symptoms today Strength training completed today  Goals Unmet:  Not Applicable  Comments: Service time is from 0810 to 0936.    Dr. Mechele Collin is Medical Director for Pulmonary Rehab at [facility].

## 2023-04-07 ENCOUNTER — Telehealth (HOSPITAL_COMMUNITY): Payer: Self-pay

## 2023-04-07 ENCOUNTER — Encounter (HOSPITAL_COMMUNITY)
Admission: RE | Admit: 2023-04-07 | Discharge: 2023-04-07 | Disposition: A | Discharge status: Home / Self Care | Payer: Medicare PPO | Source: Ambulatory Visit | Attending: Pulmonary Disease | Admitting: Pulmonary Disease

## 2023-04-07 NOTE — Telephone Encounter (Signed)
 Pt absent from class this week due to travel. Plans to return 3/18

## 2023-04-09 ENCOUNTER — Encounter (HOSPITAL_COMMUNITY): Payer: Medicare PPO

## 2023-04-13 ENCOUNTER — Telehealth (HOSPITAL_COMMUNITY): Payer: Self-pay

## 2023-04-13 NOTE — Telephone Encounter (Signed)
 Dr. Isaiah Serge,  I am requesting a target heart rate increase for this patients. New THR will be 59-133. Please advice.

## 2023-04-14 ENCOUNTER — Telehealth (HOSPITAL_COMMUNITY): Payer: Self-pay

## 2023-04-14 ENCOUNTER — Encounter (HOSPITAL_COMMUNITY)
Admission: RE | Admit: 2023-04-14 | Discharge: 2023-04-14 | Disposition: A | Discharge status: Home / Self Care | Payer: Medicare PPO | Source: Ambulatory Visit | Attending: Pulmonary Disease | Admitting: Pulmonary Disease

## 2023-04-14 DIAGNOSIS — J849 Interstitial pulmonary disease, unspecified: Secondary | ICD-10-CM

## 2023-04-14 NOTE — Telephone Encounter (Signed)
 Called to check on Brandy Medina. Tierrah called out due to having a head cold. Advised pt on not having a fever within 48 hours of return. Pt voiced understanding. Plans return Thursday or next week .Marland Kitchen

## 2023-04-15 ENCOUNTER — Telehealth (HOSPITAL_COMMUNITY): Payer: Self-pay

## 2023-04-15 NOTE — Telephone Encounter (Signed)
 Pt called and stated she will not be able to attend [place] on 3/20 due to having a cold. Adv pt she would need to be 48 hours symptom free before returning.   Thanks.  Shanda Bumps

## 2023-04-15 NOTE — Telephone Encounter (Signed)
 Okay for target heart rate increase.  Thank you.

## 2023-04-15 NOTE — Progress Notes (Signed)
 Pulmonary Individual Treatment Plan  Patient Details  Name: Brandy Medina MRN: [id number] Date of Birth: 03-07-1950 Referring Provider:   Doristine Devoid Pulmonary Rehab Walk Test from 02/20/2023 in [facility]  Referring Provider Mannam       Initial Encounter Date:  Flowsheet Row Pulmonary Rehab Walk Test from 02/20/2023 in [facility]  Date 02/20/23       Visit Diagnosis: Interstitial lung disease (HCC)  Patient's Home Medications on Admission:   Current Outpatient Medications:    acetaminophen (TYLENOL) 500 MG tablet, Take 500-1,000 mg by mouth every 6 (six) hours as needed for moderate pain or headache., Disp: , Rfl:    acyclovir ointment (ZOVIRAX) 5 %, Apply 1 Application topically every 4 (four) hours as needed., Disp: 15 g, Rfl: 1   amLODipine (NORVASC) 5 MG tablet, Take 1 tablet (5 mg total) by mouth daily. May take an additional 2.5 mg tablet if systolic blood pressure 160 if needed. (Patient taking differently: Take 2.5 mg by mouth daily.), Disp: 135 tablet, Rfl: 3   butalbital-acetaminophen-caffeine (FIORICET) 50-325-40 MG tablet, Take 1 tablet by mouth 2 (two) times daily as needed for migraine., Disp: , Rfl:    chlorpheniramine (CHLOR-TRIMETON) 4 MG tablet, Take 4 mg by mouth every 4 (four) hours as needed for allergies., Disp: , Rfl:    Cholecalciferol (VITAMIN D) 50 MCG (2000 UT) tablet, Take 1,000 Units by mouth daily. Tues and Thurs, Disp: , Rfl:    dextromethorphan (DELSYM) 30 MG/5ML liquid, Take 30 mg by mouth 2 (two) times daily as needed for cough., Disp: , Rfl:    empagliflozin (JARDIANCE) 10 MG TABS tablet, Take 1 tablet (10 mg total) by mouth daily before breakfast., Disp: , Rfl:    epoetin alfa (EPOGEN) 10000 UNIT/ML injection, as directed Injection once every 3 weeks for 3 months, Disp: , Rfl:    gabapentin (NEURONTIN) 100 MG capsule, Take 1  capsule (100 mg total) by mouth daily. (Patient not taking: Reported on 03/03/2023), Disp: 30 capsule, Rfl: 2   Homeopathic Products (ARNICARE PAIN RELIEF EX), Apply 1 application. topically daily as needed (pain)., Disp: , Rfl:    loratadine (CLARITIN) 10 MG tablet, Take 10 mg by mouth daily as needed for allergies., Disp: , Rfl:    Respiratory Therapy Supplies (FLUTTER) DEVI, Use as directed., Disp: 1 each, Rfl: 0   rosuvastatin (CRESTOR) 5 MG tablet, Take 1 tablet (5 mg total) by mouth every Tuesday., Disp: 12 tablet, Rfl: 3   valACYclovir (VALTREX) 1000 MG tablet, One tab po tid, Disp: 21 tablet, Rfl: 0   valsartan (DIOVAN) 320 MG tablet, Take 1 tablet (320 mg total) by mouth daily., Disp: 90 tablet, Rfl: 2   Wheat Dextrin (BENEFIBER PO), Take 1 Scoop by mouth daily., Disp: , Rfl:   Current Facility-Administered Medications:    desmopressin (DDAVP) 20.4 mcg in sodium chloride 0.9 % 50 mL IVPB, 0.3 mcg/kg, Intravenous, Once, Baron-Johnson, Alison, PA-C   desmopressin (DDAVP) 20.4 mcg in sodium chloride 0.9 % 50 mL IVPB, 0.3 mcg/kg, Intravenous, Once, Edrick Kins, PA-C  Past Medical History: Past Medical History:  Diagnosis Date   Allergic rhinitis    Diabetes (HCC)    HTN (hypertension)    Migraines    PONV (postoperative nausea and vomiting)    Pulmonary fibrosis (HCC)     Tobacco Use: Social History   Tobacco Use  Smoking Status Former  Current packs/day: 0.00   Average packs/day: 0.5 packs/day for 18.0 years (9.0 ttl pk-yrs)   Types: Cigarettes   Start date: 07/06/1975   Quit date: 07/05/1993   Years since quitting: 29.7  Smokeless Tobacco Never    Labs: Review Flowsheet       Latest Ref Rng & Units 02/03/2023  Labs for ITP Cardiac and Pulmonary Rehab  Cholestrol 100 - 199 mg/dL 563   LDL (calc) 0 - 99 mg/dL 87   HDL-C >87 mg/dL 94   Trlycerides 0 - 564 mg/dL 69   Hemoglobin P3I 4.8 - 5.6 % 6.5     Capillary Blood Glucose: Lab Results  Component Value  Date   GLUCAP 74 03/03/2023   GLUCAP 105 (H) 03/03/2023   GLUCAP 74 02/26/2023   GLUCAP 97 02/26/2023   GLUCAP 97 06/01/2020     Pulmonary Assessment Scores:  Pulmonary Assessment Scores     Row Name 02/20/23 1045 03/18/23 0816       ADL UCSD   ADL Phase Entry --    SOB Score total 19 --      CAT Score   CAT Score -- 7      mMRC Score   mMRC Score 2 --            UCSD: Self-administered rating of dyspnea associated with activities of daily living (ADLs) 6-point scale (0 = "not at all" to 5 = "maximal or unable to do because of breathlessness")  Scoring Scores range from 0 to 120.  Minimally important difference is 5 units  CAT: CAT can identify the health impairment of COPD patients and is better correlated with disease progression.  CAT has a scoring range of zero to 40. The CAT score is classified into four groups of low (less than 10), medium (10 - 20), high (21-30) and very high (31-40) based on the impact level of disease on health status. A CAT score over 10 suggests significant symptoms.  A worsening CAT score could be explained by an exacerbation, poor medication adherence, poor inhaler technique, or progression of COPD or comorbid conditions.  CAT MCID is 2 points  mMRC: mMRC (Modified Medical Research Council) Dyspnea Scale is used to assess the degree of baseline functional disability in patients of respiratory disease due to dyspnea. No minimal important difference is established. A decrease in score of 1 point or greater is considered a positive change.   Pulmonary Function Assessment:  Pulmonary Function Assessment - 02/20/23 1216       Breath   Bilateral Breath Sounds Clear    Shortness of Breath Yes;Limiting activity             Exercise Target Goals: Exercise Program Goal: Individual exercise prescription set using results from initial 6 min walk test and THRR while considering  patient's activity barriers and safety.   Exercise  Prescription Goal: Initial exercise prescription builds to 30-45 minutes a day of aerobic activity, 2-3 days per week.  Home exercise guidelines will be given to patient during program as part of exercise prescription that the participant will acknowledge.  Activity Barriers & Risk Stratification:  Activity Barriers & Cardiac Risk Stratification - 02/20/23 1108       Activity Barriers & Cardiac Risk Stratification   Activity Barriers Deconditioning;Muscular Weakness;Shortness of Breath;Joint Problems;Back Problems             6 Minute Walk:  6 Minute Walk     Row Name 02/20/23 1211  6 Minute Walk   Phase Initial     Distance 1390 feet     Walk Time 6 minutes     # of Rest Breaks 0     MPH 2.63     METS 3.23     RPE 11     Perceived Dyspnea  1     VO2 Peak 11.3     Symptoms No     Resting HR 83 bpm     Resting BP 108/62     Resting Oxygen Saturation  100 %     Exercise Oxygen Saturation  during 6 min walk 89 %     Max Ex. HR 128 bpm     Max Ex. BP 140/62     2 Minute Post BP 118/64       Interval HR   1 Minute HR 107     2 Minute HR 115     3 Minute HR 120     4 Minute HR 124     5 Minute HR 128     6 Minute HR 126     2 Minute Post HR 88     Interval Heart Rate? Yes       Interval Oxygen   Interval Oxygen? Yes     Baseline Oxygen Saturation % 100 %     1 Minute Oxygen Saturation % 89 %     1 Minute Liters of Oxygen 0 L     2 Minute Oxygen Saturation % 95 %     2 Minute Liters of Oxygen 0 L     3 Minute Oxygen Saturation % 93 %     3 Minute Liters of Oxygen 0 L     4 Minute Oxygen Saturation % 95 %     4 Minute Liters of Oxygen 0 L     5 Minute Oxygen Saturation % 92 %     5 Minute Liters of Oxygen 0 L     6 Minute Oxygen Saturation % 93 %     6 Minute Liters of Oxygen 0 L     2 Minute Post Oxygen Saturation % 96 %     2 Minute Post Liters of Oxygen 0 L              Oxygen Initial Assessment:  Oxygen Initial Assessment - 02/20/23 1102        Home Oxygen   Home Oxygen Device None    Sleep Oxygen Prescription None    Home Exercise Oxygen Prescription None    Home Resting Oxygen Prescription None      Initial 6 min Walk   Oxygen Used None      Program Oxygen Prescription   Program Oxygen Prescription None      Intervention   Short Term Goals To learn and understand importance of maintaining oxygen saturations>88%;To learn and exhibit compliance with exercise, home and travel O2 prescription;To learn and demonstrate proper use of respiratory medications;To learn and understand importance of monitoring SPO2 with pulse oximeter and demonstrate accurate use of the pulse oximeter.;To learn and demonstrate proper pursed lip breathing techniques or other breathing techniques.     Long  Term Goals Exhibits compliance with exercise, home  and travel O2 prescription;Verbalizes importance of monitoring SPO2 with pulse oximeter and return demonstration;Maintenance of O2 saturations>88%;Exhibits proper breathing techniques, such as pursed lip breathing or other method taught during program session;Compliance with respiratory medication;Demonstrates proper use of MDI's  Oxygen Re-Evaluation:  Oxygen Re-Evaluation     Row Name 03/09/23 1115 04/08/23 0846           Program Oxygen Prescription   Program Oxygen Prescription None None        Home Oxygen   Home Oxygen Device None None      Sleep Oxygen Prescription None None      Home Exercise Oxygen Prescription None None      Home Resting Oxygen Prescription None None        Goals/Expected Outcomes   Short Term Goals To learn and understand importance of maintaining oxygen saturations>88%;To learn and exhibit compliance with exercise, home and travel O2 prescription;To learn and demonstrate proper use of respiratory medications;To learn and understand importance of monitoring SPO2 with pulse oximeter and demonstrate accurate use of the pulse oximeter.;To learn and  demonstrate proper pursed lip breathing techniques or other breathing techniques.  To learn and understand importance of maintaining oxygen saturations>88%;To learn and exhibit compliance with exercise, home and travel O2 prescription;To learn and demonstrate proper use of respiratory medications;To learn and understand importance of monitoring SPO2 with pulse oximeter and demonstrate accurate use of the pulse oximeter.;To learn and demonstrate proper pursed lip breathing techniques or other breathing techniques.       Long  Term Goals Exhibits compliance with exercise, home  and travel O2 prescription;Verbalizes importance of monitoring SPO2 with pulse oximeter and return demonstration;Maintenance of O2 saturations>88%;Exhibits proper breathing techniques, such as pursed lip breathing or other method taught during program session;Compliance with respiratory medication;Demonstrates proper use of MDI's Exhibits compliance with exercise, home  and travel O2 prescription;Verbalizes importance of monitoring SPO2 with pulse oximeter and return demonstration;Maintenance of O2 saturations>88%;Exhibits proper breathing techniques, such as pursed lip breathing or other method taught during program session;Compliance with respiratory medication;Demonstrates proper use of MDI's      Goals/Expected Outcomes Compliance and understanding of oxygen saturation monitoring and breathing techniques to decrease shortness of breath. Compliance and understanding of oxygen saturation monitoring and breathing techniques to decrease shortness of breath.               Oxygen Discharge (Final Oxygen Re-Evaluation):  Oxygen Re-Evaluation - 04/08/23 0846       Program Oxygen Prescription   Program Oxygen Prescription None      Home Oxygen   Home Oxygen Device None    Sleep Oxygen Prescription None    Home Exercise Oxygen Prescription None    Home Resting Oxygen Prescription None      Goals/Expected Outcomes   Short Term  Goals To learn and understand importance of maintaining oxygen saturations>88%;To learn and exhibit compliance with exercise, home and travel O2 prescription;To learn and demonstrate proper use of respiratory medications;To learn and understand importance of monitoring SPO2 with pulse oximeter and demonstrate accurate use of the pulse oximeter.;To learn and demonstrate proper pursed lip breathing techniques or other breathing techniques.     Long  Term Goals Exhibits compliance with exercise, home  and travel O2 prescription;Verbalizes importance of monitoring SPO2 with pulse oximeter and return demonstration;Maintenance of O2 saturations>88%;Exhibits proper breathing techniques, such as pursed lip breathing or other method taught during program session;Compliance with respiratory medication;Demonstrates proper use of MDI's    Goals/Expected Outcomes Compliance and understanding of oxygen saturation monitoring and breathing techniques to decrease shortness of breath.             Initial Exercise Prescription:  Initial Exercise Prescription - 02/20/23 1200  Date of Initial Exercise RX and Referring Provider   Date 02/20/23    Referring Provider Mannam    Expected Discharge Date 05/14/23      Recumbant Elliptical   Level 1    RPM 20    Watts 40    Minutes 15    METs 2.5      Track   Minutes 15    METs 3.2      Prescription Details   Frequency (times per week) 2    Duration Progress to 30 minutes of continuous aerobic without signs/symptoms of physical distress      Intensity   THRR 40-80% of Max Heartrate 59-118    Ratings of Perceived Exertion 11-13    Perceived Dyspnea 0-4      Progression   Progression Continue to progress workloads to maintain intensity without signs/symptoms of physical distress.      Resistance Training   Training Prescription Yes    Weight red bands    Reps 10-15             Perform Capillary Blood Glucose checks as needed.  Exercise  Prescription Changes:   Exercise Prescription Changes     Row Name 03/03/23 0900 03/31/23 0900 04/02/23 0934         Response to Exercise   Blood Pressure (Admit) 120/72 112/70 114/80     Blood Pressure (Exercise) 120/76 130/70 --     Blood Pressure (Exit) 126/70 100/68 114/70     Heart Rate (Admit) 93 bpm 79 bpm 66 bpm     Heart Rate (Exercise) 118 bpm 123 bpm 116 bpm     Heart Rate (Exit) 103 bpm 98 bpm 79 bpm     Oxygen Saturation (Admit) 99 % 98 % 90 %     Oxygen Saturation (Exercise) 93 % 94 % 91 %     Oxygen Saturation (Exit) 93 % 99 % 91 %     Rating of Perceived Exertion (Exercise) 13 13 13      Perceived Dyspnea (Exercise) 2 3 3      Duration Continue with 30 min of aerobic exercise without signs/symptoms of physical distress. Continue with 30 min of aerobic exercise without signs/symptoms of physical distress. Continue with 30 min of aerobic exercise without signs/symptoms of physical distress.     Intensity THRR unchanged THRR unchanged THRR unchanged       Progression   Progression Continue to progress workloads to maintain intensity without signs/symptoms of physical distress. Continue to progress workloads to maintain intensity without signs/symptoms of physical distress. Continue to progress workloads to maintain intensity without signs/symptoms of physical distress.       Resistance Training   Training Prescription Yes Yes Yes     Weight red bands red bands red bands     Reps 10-15 10-15 10-15     Time 10 Minutes 10 Minutes 10 Minutes       Treadmill   MPH -- 2.3 2.4     Grade -- 1 1.5     Minutes -- 15 15     METs -- 2.9 3.4       Recumbant Elliptical   Level 1 2 3      RPM 57 -- --     Minutes 15 15 15      METs 3.7 3.7 3.9       Track   Laps 13 -- --     Minutes 15 -- --     METs 3 -- --  Exercise Comments:   Exercise Comments     Row Name 02/26/23 7253           Exercise Comments Kayliana completed her first day of exercise.  She exercised for 15 min on the track and recumbent elliptical. Luverta averaged 2.85 METs on the track and 3.4 METs at level 1 on the recumbent elliptical. She performed the warmup and cooldown standing without limitations. Discussed METs.                Exercise Goals and Review:   Exercise Goals     Row Name 02/20/23 1045             Exercise Goals   Increase Physical Activity Yes       Intervention Provide advice, education, support and counseling about physical activity/exercise needs.;Develop an individualized exercise prescription for aerobic and resistive training based on initial evaluation findings, risk stratification, comorbidities and participant's personal goals.       Expected Outcomes Short Term: Attend rehab on a regular basis to increase amount of physical activity.;Long Term: Exercising regularly at least 3-5 days a week.;Long Term: Add in home exercise to make exercise part of routine and to increase amount of physical activity.       Increase Strength and Stamina Yes       Intervention Provide advice, education, support and counseling about physical activity/exercise needs.;Develop an individualized exercise prescription for aerobic and resistive training based on initial evaluation findings, risk stratification, comorbidities and participant's personal goals.       Expected Outcomes Short Term: Increase workloads from initial exercise prescription for resistance, speed, and METs.;Short Term: Perform resistance training exercises routinely during rehab and add in resistance training at home;Long Term: Improve cardiorespiratory fitness, muscular endurance and strength as measured by increased METs and functional capacity ( )       Able to understand and use rate of perceived exertion (RPE) scale Yes       Intervention Provide education and explanation on how to use RPE scale       Expected Outcomes Short Term: Able to use RPE daily in rehab to express subjective  intensity level;Long Term:  Able to use RPE to guide intensity level when exercising independently       Able to understand and use Dyspnea scale Yes       Intervention Provide education and explanation on how to use Dyspnea scale       Expected Outcomes Short Term: Able to use Dyspnea scale daily in rehab to express subjective sense of shortness of breath during exertion;Long Term: Able to use Dyspnea scale to guide intensity level when exercising independently       Knowledge and understanding of Target Heart Rate Range (THRR) Yes       Intervention Provide education and explanation of THRR including how the numbers were predicted and where they are located for reference       Expected Outcomes Short Term: Able to state/look up THRR;Long Term: Able to use THRR to govern intensity when exercising independently;Short Term: Able to use daily as guideline for intensity in rehab       Understanding of Exercise Prescription Yes       Intervention Provide education, explanation, and written materials on patient's individual exercise prescription       Expected Outcomes Short Term: Able to explain program exercise prescription;Long Term: Able to explain home exercise prescription to exercise independently  Exercise Goals Re-Evaluation :  Exercise Goals Re-Evaluation     Row Name 03/09/23 1108 04/08/23 0843           Exercise Goal Re-Evaluation   Exercise Goals Review Increase Physical Activity;Able to understand and use Dyspnea scale;Understanding of Exercise Prescription;Increase Strength and Stamina;Knowledge and understanding of Target Heart Rate Range (THRR);Able to understand and use rate of perceived exertion (RPE) scale Increase Physical Activity;Able to understand and use Dyspnea scale;Understanding of Exercise Prescription;Increase Strength and Stamina;Knowledge and understanding of Target Heart Rate Range (THRR);Able to understand and use rate of perceived exertion (RPE)  scale      Comments Treva has completed 2 exercise sessions. She exercises for 15 min on the track and recumbent elliptical. Kathlene Cote 3.0 METs on the track and 3.7 METs at level 1 on the recumbent elliptical. She performs the warmup and cooldown standing without limitations. It is too soon to notate any progressions. Kimia is scheduled to be out of rehab for the next couple of weeks. Will continue to monitor and progress as able. Yasmine has completed 6 exercise sessions. She exercises for 15 min on the treadmill and recumbent elliptical. Santasia averages 3.4 METs on the treadmill and 3.9 METs at level 3 on the recumbent elliptical. She performs the warmup and cooldown standing without limitations. Prescious has increased her level several times on the recumbent elliptical as METs have increased. She has also transitioned from the track to the treadmill. She tolerates the level increases and treadmill well. Stephanny seems motivated to exercise and improve her functional capacity. Will continue to monitor and progress as able.      Expected Outcomes Through exercise at rehab and home, the patient will decrease shortness of breath with daily activities and feel confident in carrying out an exercise regimen at home. Through exercise at rehab and home, the patient will decrease shortness of breath with daily activities and feel confident in carrying out an exercise regimen at home.               Discharge Exercise Prescription (Final Exercise Prescription Changes):  Exercise Prescription Changes - 04/02/23 0934       Response to Exercise   Blood Pressure (Admit) 114/80    Blood Pressure (Exit) 114/70    Heart Rate (Admit) 66 bpm    Heart Rate (Exercise) 116 bpm    Heart Rate (Exit) 79 bpm    Oxygen Saturation (Admit) 90 %    Oxygen Saturation (Exercise) 91 %    Oxygen Saturation (Exit) 91 %    Rating of Perceived Exertion (Exercise) 13    Perceived Dyspnea (Exercise) 3    Duration Continue with  30 min of aerobic exercise without signs/symptoms of physical distress.    Intensity THRR unchanged      Progression   Progression Continue to progress workloads to maintain intensity without signs/symptoms of physical distress.      Resistance Training   Training Prescription Yes    Weight red bands    Reps 10-15    Time 10 Minutes      Treadmill   MPH 2.4    Grade 1.5    Minutes 15    METs 3.4      Recumbant Elliptical   Level 3    Minutes 15    METs 3.9             Nutrition:  Target Goals: Understanding of nutrition guidelines, daily intake of sodium 1500mg , cholesterol 200mg , calories 30% from  fat and 7% or less from saturated fats, daily to have 5 or more servings of fruits and vegetables.  Biometrics:  Pre Biometrics - 02/20/23 1215       Pre Biometrics   Grip Strength 19 kg              Nutrition Therapy Plan and Nutrition Goals:  Nutrition Therapy & Goals - 04/07/23 1113       Nutrition Therapy   Diet General Healthy Diet    Drug/Food Interactions Statins/Certain Fruits      Personal Nutrition Goals   Nutrition Goal Patient to improve diet quality by using the plate method as a guide for meal planning to include lean protein/plant protein, fruits, vegetables, whole grains, nonfat dairy as part of a well-balanced diet.    Comments Alexya has medical history of ILD, DM2, HTN. She started jardiance for elevated A1c in late January 2025. Lipids WNL. She is down ~7# since her orientation to our program; she has maintained her weight for >1 month. Patient will benefit from participation in pulmonary rehab for nutrition, exercise, and lifestyle modification.      Intervention Plan   Intervention Prescribe, educate and counsel regarding individualized specific dietary modifications aiming towards targeted core components such as weight, hypertension, lipid management, diabetes, heart failure and other comorbidities.;Nutrition handout(s) given to patient.     Expected Outcomes Short Term Goal: Understand basic principles of dietary content, such as calories, fat, sodium, cholesterol and nutrients.;Long Term Goal: Adherence to prescribed nutrition plan.             Nutrition Assessments:  MEDIFICTS Score Key: >=70 Need to make dietary changes  40-70 Heart Healthy Diet <= 40 Therapeutic Level Cholesterol Diet   Picture Your Plate Scores: <54 Unhealthy dietary pattern with much room for improvement. 41-50 Dietary pattern unlikely to meet recommendations for good health and room for improvement. 51-60 More healthful dietary pattern, with some room for improvement.  >60 Healthy dietary pattern, although there may be some specific behaviors that could be improved.    Nutrition Goals Re-Evaluation:  Nutrition Goals Re-Evaluation     Row Name 04/07/23 1113             Goals   Current Weight 158 lb 15.2 oz (72.1 kg)  weight from last attended session on 04/02/23       Comment A1c 6.5, lipids WNL, LDL 87       Expected Outcome Adhya has medical history of ILD, DM2, HTN. She started jardiance for elevated A1c in late January 2025. Lipids WNL. She is down ~7# since her orientation to our program; she has maintained her weight for >1 month. Patient will benefit from participation in pulmonary rehab for nutrition, exercise, and lifestyle modification.                Nutrition Goals Discharge (Final Nutrition Goals Re-Evaluation):  Nutrition Goals Re-Evaluation - 04/07/23 1113       Goals   Current Weight 158 lb 15.2 oz (72.1 kg)   weight from last attended session on 04/02/23   Comment A1c 6.5, lipids WNL, LDL 87    Expected Outcome Mellanie has medical history of ILD, DM2, HTN. She started jardiance for elevated A1c in late January 2025. Lipids WNL. She is down ~7# since her orientation to our program; she has maintained her weight for >1 month. Patient will benefit from participation in pulmonary rehab for nutrition, exercise, and  lifestyle modification.  Psychosocial: Target Goals: Acknowledge presence or absence of significant depression and/or stress, maximize coping skills, provide positive support system. Participant is able to verbalize types and ability to use techniques and skills needed for reducing stress and depression.  Initial Review & Psychosocial Screening:  Initial Psych Review & Screening - 02/20/23 1046       Initial Review   Current issues with None Identified      Family Dynamics   Good Support System? Yes    Comments spouse, daughter, and son      Barriers   Psychosocial barriers to participate in program There are no identifiable barriers or psychosocial needs.             Quality of Life Scores:  Scores of 19 and below usually indicate a poorer quality of life in these areas.  A difference of  2-3 points is a clinically meaningful difference.  A difference of 2-3 points in the total score of the Quality of Life Index has been associated with significant improvement in overall quality of life, self-image, physical symptoms, and general health in studies assessing change in quality of life.  PHQ-9: Review Flowsheet       02/20/2023 02/03/2023  Depression screen PHQ 2/9  Decreased Interest 0 0  Down, Depressed, Hopeless 0 0  PHQ - 2 Score 0 0  Altered sleeping 0 0  Tired, decreased energy 0 0  Change in appetite 0 0  Feeling bad or failure about yourself  0 0  Trouble concentrating 0 0  Moving slowly or fidgety/restless 0 0  Suicidal thoughts 0 0  PHQ-9 Score 0 0  Difficult doing work/chores Not difficult at all Not difficult at all   Interpretation of Total Score  Total Score Depression Severity:  1-4 = Minimal depression, 5-9 = Mild depression, 10-14 = Moderate depression, 15-19 = Moderately severe depression, 20-27 = Severe depression   Psychosocial Evaluation and Intervention:  Psychosocial Evaluation - 02/20/23 1046       Psychosocial Evaluation &  Interventions   Interventions Encouraged to exercise with the program and follow exercise prescription    Comments Billye denies any psychosocial barriers at this time.    Expected Outcomes For Bernice to participate in rehab free of psychosocial concerns.    Continue Psychosocial Services  No Follow up required             Psychosocial Re-Evaluation:  Psychosocial Re-Evaluation     Row Name 03/11/23 1416 04/06/23 1443           Psychosocial Re-Evaluation   Current issues with None Identified None Identified      Comments At re-eval, Suzzette continues to deny any psy/soc barriers or concerns Madalynne continues to deny any psychosocial barriers or concerns at this time.      Expected Outcomes For Camdynn to participate in rehab free of psychosocial concerns. For Harolyn to participate in rehab free of psychosocial concerns.      Interventions Encouraged to attend Pulmonary Rehabilitation for the exercise Encouraged to attend Pulmonary Rehabilitation for the exercise      Continue Psychosocial Services  No Follow up required No Follow up required               Psychosocial Discharge (Final Psychosocial Re-Evaluation):  Psychosocial Re-Evaluation - 04/06/23 1443       Psychosocial Re-Evaluation   Current issues with None Identified    Comments Samyia continues to deny any psychosocial barriers or concerns at this time.  Expected Outcomes For Samani to participate in rehab free of psychosocial concerns.    Interventions Encouraged to attend Pulmonary Rehabilitation for the exercise    Continue Psychosocial Services  No Follow up required             Education: Education Goals: Education classes will be provided on a weekly basis, covering required topics. Participant will state understanding/return demonstration of topics presented.  Learning Barriers/Preferences:  Learning Barriers/Preferences - 02/20/23 1047       Learning Barriers/Preferences   Learning Barriers  Sight   wears glasses   Learning Preferences None             Education Topics: Know Your Numbers Group instruction that is supported by a PowerPoint presentation. Instructor discusses importance of knowing and understanding resting, exercise, and post-exercise oxygen saturation, heart rate, and blood pressure. Oxygen saturation, heart rate, blood pressure, rating of perceived exertion, and dyspnea are reviewed along with a normal range for these values.    Exercise for the Pulmonary Patient Group instruction that is supported by a PowerPoint presentation. Instructor discusses benefits of exercise, core components of exercise, frequency, duration, and intensity of an exercise routine, importance of utilizing pulse oximetry during exercise, safety while exercising, and options of places to exercise outside of rehab.    MET Level  Group instruction provided by PowerPoint, verbal discussion, and written material to support subject matter. Instructor reviews what METs are and how to increase METs.  Flowsheet Row PULMONARY REHAB OTHER RESPIRATORY from 03/24/2023 in [facility]  Date 03/24/23  Educator EP  Instruction Review Code 1- Verbalizes Understanding       Pulmonary Medications Verbally interactive group education provided by instructor with focus on inhaled medications and proper administration.   Anatomy and Physiology of the Respiratory System Group instruction provided by PowerPoint, verbal discussion, and written material to support subject matter. Instructor reviews respiratory cycle and anatomical components of the respiratory system and their functions. Instructor also reviews differences in obstructive and restrictive respiratory diseases with examples of each.  Flowsheet Row PULMONARY REHAB OTHER RESPIRATORY from 04/02/2023 in [facility]  Date 04/02/23   Educator RT  Instruction Review Code 1- Verbalizes Understanding       Oxygen Safety Group instruction provided by PowerPoint, verbal discussion, and written material to support subject matter. There is an overview of "What is Oxygen" and "Why do we need it".  Instructor also reviews how to create a safe environment for oxygen use, the importance of using oxygen as prescribed, and the risks of noncompliance. There is a brief discussion on traveling with oxygen and resources the patient may utilize.   Oxygen Use Group instruction provided by PowerPoint, verbal discussion, and written material to discuss how supplemental oxygen is prescribed and different types of oxygen supply systems. Resources for more information are provided.    Breathing Techniques Group instruction that is supported by demonstration and informational handouts. Instructor discusses the benefits of pursed lip and diaphragmatic breathing and detailed demonstration on how to perform both.     Risk Factor Reduction Group instruction that is supported by a PowerPoint presentation. Instructor discusses the definition of a risk factor, different risk factors for pulmonary disease, and how the heart and lungs work together.   Pulmonary Diseases Group instruction provided by PowerPoint, verbal discussion, and written material to support subject matter. Instructor gives an overview of the different  type of pulmonary diseases. There is also a discussion on risk factors and symptoms as well as ways to manage the diseases. Flowsheet Row PULMONARY REHAB OTHER RESPIRATORY from 03/26/2023 in [facility]  Date 03/26/23  Educator RT  Instruction Review Code 1- Verbalizes Understanding       Stress and Energy Conservation Group instruction provided by PowerPoint, verbal discussion, and written material to support subject matter. Instructor gives an overview of stress and  the impact it can have on the body. Instructor also reviews ways to reduce stress. There is also a discussion on energy conservation and ways to conserve energy throughout the day. Flowsheet Row PULMONARY REHAB OTHER RESPIRATORY from 02/26/2023 in [facility]  Date 02/26/23  Educator RN  Instruction Review Code 1- Verbalizes Understanding       Warning Signs and Symptoms Group instruction provided by PowerPoint, verbal discussion, and written material to support subject matter. Instructor reviews warning signs and symptoms of stroke, heart attack, cold and flu. Instructor also reviews ways to prevent the spread of infection.   Other Education Group or individual verbal, written, or video instructions that support the educational goals of the pulmonary rehab program.    Knowledge Questionnaire Score:  Knowledge Questionnaire Score - 02/20/23 1100       Knowledge Questionnaire Score   Pre Score 16/18             Core Components/Risk Factors/Patient Goals at Admission:  Personal Goals and Risk Factors at Admission - 02/20/23 1047       Core Components/Risk Factors/Patient Goals on Admission   Improve shortness of breath with ADL's Yes    Intervention Provide education, individualized exercise plan and daily activity instruction to help decrease symptoms of SOB with activities of daily living.    Expected Outcomes Short Term: Improve cardiorespiratory fitness to achieve a reduction of symptoms when performing ADLs;Long Term: Be able to perform more ADLs without symptoms or delay the onset of symptoms             Core Components/Risk Factors/Patient Goals Review:   Goals and Risk Factor Review     Row Name 03/11/23 1417 04/06/23 1443           Core Components/Risk Factors/Patient Goals Review   Personal Goals Review Improve shortness of breath with ADL's;Develop more efficient breathing techniques such as purse  lipped breathing and diaphragmatic breathing and practicing self-pacing with activity. Improve shortness of breath with ADL's;Develop more efficient breathing techniques such as purse lipped breathing and diaphragmatic breathing and practicing self-pacing with activity.      Review Unable to assess goals. Anae has completed 2 sessions so far. Goal progressing for improving shortness of breath. Skyy is currently able to maintain sats >88% on RA while exercising. She is currently exercising on the Treadmill and the Recumbant Elliptical. Goal progressing for developing more efficient breathing techniques such as purse lipped breathing and diaphragmatic breathing; and practicing self-pacing with activity. She is able to rate her perceived exertion score whenasked by staff. We will continue to monitor Tory's progress throughout the program.      Expected Outcomes For Baneza to improve her shortness of breath with ADLs and develop more efficient breathing techniques such as purse lipped breathing and diaphragmatic breathing; and practicing self-pacing with activity For Tericka to improve her shortness of breath with ADLs and develop more efficient breathing techniques such as  purse lipped breathing and diaphragmatic breathing; and practicing self-pacing with activity               Core Components/Risk Factors/Patient Goals at Discharge (Final Review):   Goals and Risk Factor Review - 04/06/23 1443       Core Components/Risk Factors/Patient Goals Review   Personal Goals Review Improve shortness of breath with ADL's;Develop more efficient breathing techniques such as purse lipped breathing and diaphragmatic breathing and practicing self-pacing with activity.    Review Goal progressing for improving shortness of breath. Tkeyah is currently able to maintain sats >88% on RA while exercising. She is currently exercising on the Treadmill and the Recumbant Elliptical. Goal progressing for developing more  efficient breathing techniques such as purse lipped breathing and diaphragmatic breathing; and practicing self-pacing with activity. She is able to rate her perceived exertion score whenasked by staff. We will continue to monitor Mayzie's progress throughout the program.    Expected Outcomes For Ahlivia to improve her shortness of breath with ADLs and develop more efficient breathing techniques such as purse lipped breathing and diaphragmatic breathing; and practicing self-pacing with activity             ITP Comments: Pt is making expected progress toward Pulmonary Rehab goals after completing 6 session(s). Recommend continued exercise, life style modification, education, and utilization of breathing techniques to increase stamina and strength, while also decreasing shortness of breath with exertion.     Comments: Dr. Mechele Collin is Medical Director for Pulmonary Rehab at [facility].

## 2023-04-16 ENCOUNTER — Encounter (HOSPITAL_COMMUNITY): Payer: Medicare PPO

## 2023-04-16 ENCOUNTER — Telehealth (HOSPITAL_COMMUNITY): Payer: Self-pay

## 2023-04-16 NOTE — Telephone Encounter (Signed)
-----   Message from [place] sent at 04/15/2023  3:23 PM EDT ----- Regarding: Pt called out Hey,  Pt called and stated she will not be able to attend PR on 3/20 due to having a cold. Adv pt she would need to be 48 hours symptom free before returning.  Thanks. Shanda Bumps

## 2023-04-17 ENCOUNTER — Encounter: Payer: Self-pay | Admitting: Pulmonary Disease

## 2023-04-20 ENCOUNTER — Ambulatory Visit
Admission: EM | Admit: 2023-04-20 | Discharge: 2023-04-20 | Disposition: A | Discharge status: Home / Self Care | Attending: Physician Assistant | Admitting: Physician Assistant

## 2023-04-20 ENCOUNTER — Telehealth (HOSPITAL_COMMUNITY): Payer: Self-pay

## 2023-04-20 ENCOUNTER — Encounter: Payer: Self-pay | Admitting: Emergency Medicine

## 2023-04-20 DIAGNOSIS — J8410 Pulmonary fibrosis, unspecified: Secondary | ICD-10-CM | POA: Diagnosis not present

## 2023-04-20 DIAGNOSIS — J479 Bronchiectasis, uncomplicated: Secondary | ICD-10-CM

## 2023-04-20 DIAGNOSIS — J209 Acute bronchitis, unspecified: Secondary | ICD-10-CM

## 2023-04-20 MED ORDER — AZITHROMYCIN 250 MG PO TABS: 250.0000 mg | ORAL_TABLET | Freq: Every day | ORAL | 0 refills | Status: DC

## 2023-04-20 MED ORDER — PREDNISONE 20 MG PO TABS: 40.0000 mg | ORAL_TABLET | Freq: Every day | ORAL | 0 refills | Status: AC

## 2023-04-20 NOTE — ED Triage Notes (Signed)
 Pt c/o cough for 1 week. 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

## 2023-04-20 NOTE — Telephone Encounter (Signed)
 Patient called stating they went to urgent care today and have been diagnosed with bronchitis, and will be unable to attend class tomorrow 04/21/23.

## 2023-04-20 NOTE — Discharge Instructions (Signed)
  Please monitor your glucose while taking steroid.

## 2023-04-20 NOTE — ED Provider Notes (Signed)
 [facility]    CSN: [id number] Arrival date & time: 04/20/23  0801      History   Chief Complaint Chief Complaint  Patient presents with   Cough    HPI Brandy Medina is a 73 y.o. female.   Patient here today for evaluation of cough she had last week.  She states that this feels deeper than her typical cough from her pulmonary fibrosis.  She has known interstitial lung disease and obstructive bronchiectasis.  She has not had fever.  She denies any significant congestion, or other symptoms.  She does not report over-the-counter treatment for symptoms.  The history is provided by the patient.  Cough Associated symptoms: no chills, no ear pain, no eye discharge, no fever, no shortness of breath, no sore throat and no wheezing     Past Medical History:  Diagnosis Date   Allergic rhinitis    Diabetes (HCC)    HTN (hypertension)    Migraines    PONV (postoperative nausea and vomiting)    Pulmonary fibrosis (HCC)     Patient Active Problem List   Diagnosis Date Noted   Herpes zoster without complication 03/04/2023   Aortic atherosclerosis (HCC) 02/15/2023   Paresthesia of lower extremity 02/15/2023   Pulmonary fibrosis (HCC) 02/15/2023   Estrogen deficiency 02/03/2023   Chronic rhinitis 02/05/2022   Recurrent infections 02/05/2022   Dietary counseling and surveillance 02/05/2022   Anemia of infection and chronic disease 07/23/2021   Normocytic anemia 04/17/2020   ILD (interstitial lung disease) (HCC) 02/27/2020   Sjogren's syndrome (HCC) 02/27/2020   Abnormal findings on diagnostic imaging of lung 02/27/2020   Abnormal ECG 12/12/2015   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   Essential hypertension 07/07/2014   Upper airway cough syndrome 07/06/2014   Obstructive bronchiectasis (HCC) 06/20/2009   Nonspecific (abnormal) findings on radiological and other examination of body structure 08/02/2008   COMPUTERIZED  TOMOGRAPHY, CHEST, ABNORMAL 08/02/2008   HYPOGLYCEMIA, REACTIVE 08/01/2008   MIGRAINE HEADACHE 08/01/2008   Sinusitis, chronic 08/01/2008   GERD 08/01/2008   ABDOMINAL PAIN, CHRONIC 08/01/2008   ANEMIA, IRON DEFICIENCY, HX OF 08/01/2008   DIVERTICULOSIS, COLON, HX OF 08/01/2008    Past Surgical History:  Procedure Laterality Date   ABDOMINAL HYSTERECTOMY  1989   partial   BIOPSY  06/01/2020   Procedure: BIOPSY;  Surgeon: Kerin Salen, MD;  Location: [facility];  Service: Gastroenterology;;  EGD and COLON   BIOPSY  06/04/2021   Procedure: BIOPSY;  Surgeon: Kerin Salen, MD;  Location: [facility];  Service: Gastroenterology;;   BREAST SURGERY     COLONOSCOPY WITH PROPOFOL N/A 06/01/2020   Procedure: COLONOSCOPY WITH PROPOFOL;  Surgeon: Kerin Salen, MD;  Location: [facility];  Service: Gastroenterology;  Laterality: N/A;   ESOPHAGOGASTRODUODENOSCOPY N/A 06/04/2021   Procedure: ESOPHAGOGASTRODUODENOSCOPY (EGD);  Surgeon: Kerin Salen, MD;  Location: [facility];  Service: Gastroenterology;  Laterality: N/A;   ESOPHAGOGASTRODUODENOSCOPY (EGD) WITH PROPOFOL N/A 06/01/2020   Procedure: ESOPHAGOGASTRODUODENOSCOPY (EGD) WITH PROPOFOL;  Surgeon: Kerin Salen, MD;  Location: [facility];  Service: Gastroenterology;  Laterality: N/A;   POLYPECTOMY  06/01/2020   Procedure: POLYPECTOMY;  Surgeon: Kerin Salen, MD;  Location: [facility];  Service: Gastroenterology;;   SALPINGOOPHORECTOMY Left     OB History   No obstetric history on file.      Home Medications    Prior to Admission medications   Medication Sig Start Date End Date Taking? Authorizing Provider  azithromycin (ZITHROMAX) 250 MG tablet Take  1 tablet (250 mg total) by mouth daily. Take first 2 tablets together, then 1 every day until finished. 04/20/23  Yes Tomi Bamberger, PA-C  predniSONE (DELTASONE) 20 MG tablet Take 2 tablets (40 mg total) by mouth daily with breakfast for 5 days. 04/20/23 04/25/23 Yes Tomi Bamberger,  PA-C  acetaminophen (TYLENOL) 500 MG tablet Take 500-1,000 mg by mouth every 6 (six) hours as needed for moderate pain or headache.    [provider]  acyclovir ointment (ZOVIRAX) 5 % Apply 1 Application topically every 4 (four) hours as needed. 03/05/23   Dorothyann Peng, MD  amLODipine (NORVASC) 5 MG tablet Take 1 tablet (5 mg total) by mouth daily. May take an additional 2.5 mg tablet if systolic blood pressure 160 if needed. Patient taking differently: Take 2.5 mg by mouth daily. 06/20/22   Ronney Asters, NP  butalbital-acetaminophen-caffeine (FIORICET) [phone] MG tablet Take 1 tablet by mouth 2 (two) times daily as needed for migraine.    [provider]  chlorpheniramine (CHLOR-TRIMETON) 4 MG tablet Take 4 mg by mouth every 4 (four) hours as needed for allergies.    [provider]  Cholecalciferol (VITAMIN D) 50 MCG (2000 UT) tablet Take 1,000 Units by mouth daily. Tues and Thurs    [provider]  dextromethorphan (DELSYM) 30 MG/5ML liquid Take 30 mg by mouth 2 (two) times daily as needed for cough.    [provider]  empagliflozin (JARDIANCE) 10 MG TABS tablet Take 1 tablet (10 mg total) by mouth daily before breakfast. 02/09/23   Dorothyann Peng, MD  epoetin alfa (EPOGEN) 10000 UNIT/ML injection as directed Injection once every 3 weeks for 3 months    [provider]  gabapentin (NEURONTIN) 100 MG capsule Take 1 capsule (100 mg total) by mouth daily. Patient not taking: Reported on 03/03/2023 02/09/23 02/09/24  Dorothyann Peng, MD  Homeopathic Products (ARNICARE PAIN RELIEF EX) Apply 1 application. topically daily as needed (pain).    [provider]  loratadine (CLARITIN) 10 MG tablet Take 10 mg by mouth daily as needed for allergies.    [provider]  Respiratory Therapy Supplies (FLUTTER) DEVI Use as directed. 06/29/19   Coral Ceo, NP  rosuvastatin (CRESTOR) 5 MG tablet Take 1 tablet (5 mg total) by mouth every  Tuesday. 02/03/23   Dorothyann Peng, MD  valACYclovir Ralph Dowdy) 1000 MG tablet One tab po tid 03/03/23   Dorothyann Peng, MD  valsartan (DIOVAN) 320 MG tablet Take 1 tablet (320 mg total) by mouth daily. 02/03/23   Dorothyann Peng, MD  Wheat Dextrin (BENEFIBER PO) Take 1 Scoop by mouth daily.    [provider]    Family History Family History  Problem Relation Age of Onset   Renal Disease Brother    Stroke Brother    Bronchitis Mother    Heart disease Mother    Heart disease Father    Heart attack Father    Hypertension Sister    Diabetes Sister    Heart attack Brother    Hypertension Sister    Diabetes Sister     Social History Social History   Tobacco Use   Smoking status: Former    Current packs/day: 0.00    Average packs/day: 0.5 packs/day for 18.0 years (9.0 ttl pk-yrs)    Types: Cigarettes    Start date: 07/06/1975    Quit date: 07/05/1993    Years since quitting: 29.8   Smokeless tobacco: Never  Substance Use Topics  Alcohol use: Yes    Comment: Occasional Wine or Martini   Drug use: Never     Allergies   Avelox [moxifloxacin hcl in nacl], Avelox [moxifloxacin], Azathioprine, Codeine, Erythromycin, Fexofenadine, Levocetirizine, Lisinopril, Sulfa antibiotics, Sulfonamide derivatives, and Zyrtec [cetirizine]   Review of Systems Review of Systems  Constitutional:  Negative for chills and fever.  HENT:  Negative for congestion, ear pain, sinus pressure and sore throat.   Eyes:  Negative for discharge and redness.  Respiratory:  Positive for cough. Negative for shortness of breath and wheezing.   Gastrointestinal:  Negative for abdominal pain, diarrhea, nausea and vomiting.     Physical Exam Triage Vital Signs ED Triage Vitals  Encounter Vitals Group     BP      Systolic BP Percentile      Diastolic BP Percentile      Pulse      Resp      Temp      Temp src      SpO2      Weight      Height      Head Circumference      Peak Flow      Pain Score       Pain Loc      Pain Education      Exclude from Growth Chart    No data found.  Updated Vital Signs BP 139/89 (BP Location: Right Arm)   Pulse 81   Temp 97.9 F (36.6 C) (Oral)   Resp 18   SpO2 97%   Visual Acuity Right Eye Distance:   Left Eye Distance:   Bilateral Distance:    Right Eye Near:   Left Eye Near:    Bilateral Near:     Physical Exam Vitals and nursing note reviewed.  Constitutional:      General: She is not in acute distress.    Appearance: Normal appearance. She is not ill-appearing.  HENT:     Head: Normocephalic and atraumatic.     Nose: No congestion.     Mouth/Throat:     Mouth: Mucous membranes are moist.     Pharynx: No oropharyngeal exudate or posterior oropharyngeal erythema.  Eyes:     Conjunctiva/sclera: Conjunctivae normal.  Cardiovascular:     Rate and Rhythm: Normal rate and regular rhythm.     Heart sounds: Normal heart sounds. No murmur heard. Pulmonary:     Effort: Pulmonary effort is normal. No respiratory distress.     Breath sounds: Normal breath sounds. No wheezing, rhonchi or rales.  Skin:    General: Skin is warm and dry.  Neurological:     Mental Status: She is alert.  Psychiatric:        Mood and Affect: Mood normal.        Thought Content: Thought content normal.      UC Treatments / Results  Labs (all labs ordered are listed, but only abnormal results are displayed) Labs Reviewed - No data to display  EKG   Radiology No results found.  Procedures Procedures (including critical care time)  Medications Ordered in UC Medications - No data to display  Initial Impression / Assessment and Plan / UC Course  I have reviewed the triage vital signs and the nursing notes.  Pertinent labs & imaging results that were available during my care of the patient were reviewed by me and considered in my medical decision making (see chart for details).    Will treat with  steroid burst and Z-Pak given underlying  chronic lung disease.  Suspect likely overlying bronchitis or other inflammatory cause of symptoms.  Less likely pneumonia given lack of fever.  Recommended follow-up if no gradual improvement or with any worsening.  She does have upcoming appointment with pulmonary rehab.  Patient does have history of diabetes but is not an current problem list.  Advised her to monitor her glucose while taking steroids.  Final Clinical Impressions(s) / UC Diagnoses   Final diagnoses:  Acute bronchitis, unspecified organism  Obstructive bronchiectasis (HCC)  Pulmonary fibrosis (HCC)     Discharge Instructions       Please monitor your glucose while taking steroid.      ED Prescriptions     Medication Sig Dispense Auth. Provider   predniSONE (DELTASONE) 20 MG tablet Take 2 tablets (40 mg total) by mouth daily with breakfast for 5 days. 10 tablet Erma Pinto F, PA-C   azithromycin (ZITHROMAX) 250 MG tablet Take 1 tablet (250 mg total) by mouth daily. Take first 2 tablets together, then 1 every day until finished. 6 tablet Tomi Bamberger, PA-C      PDMP not reviewed this encounter.   Tomi Bamberger, PA-C 04/20/23 [phone]

## 2023-04-21 ENCOUNTER — Ambulatory Visit: Payer: Medicare PPO | Admitting: Neurology

## 2023-04-21 ENCOUNTER — Encounter (HOSPITAL_COMMUNITY): Payer: Medicare PPO

## 2023-04-21 DIAGNOSIS — G629 Polyneuropathy, unspecified: Secondary | ICD-10-CM | POA: Diagnosis not present

## 2023-04-21 DIAGNOSIS — R202 Paresthesia of skin: Secondary | ICD-10-CM

## 2023-04-21 NOTE — Progress Notes (Signed)
 Punch Biopsy Procedure Note  Preprocedure Diagnosis: paresthesia of skin   Postprocedure Diagnosis: same  Locations: Site 1: right lateral distal leg;  Site 2: right lateral thigh  Indications: r/o small fiber neuropathy  Anesthesia: 5 mL Lidocaine 1% with epinephrine  Procedure Details Patient informed of the risks (including but not limited to bleeding, pain, infection, scar and infection) and benefits of the procedure.  Informed consent obtained.  The areas which were chosen for biopsy, as above, and surrounding areas were given a sterile prep using alcohol and iodine. The skin was then stretched perpendicular to the skin tension lines and sample removed using the 3 mm punch. Pressure applied, hemostasis achieved.   Dressing applied. The specimen(s) was sent for pathologic examination. The patient tolerated the procedure well.  Estimated Blood Loss: 0 ml  Condition: Stable  Complications: none.  Plan: 1. Instructed to keep the wound dry and covered for 24h and clean thereafter. 2. Warning signs of infection were reviewed.    Kai Levins, MD [facility]

## 2023-04-23 ENCOUNTER — Encounter (HOSPITAL_COMMUNITY)
Admission: RE | Admit: 2023-04-23 | Discharge: 2023-04-23 | Disposition: A | Discharge status: Home / Self Care | Payer: Medicare PPO | Source: Ambulatory Visit | Attending: Pulmonary Disease | Admitting: Pulmonary Disease

## 2023-04-23 DIAGNOSIS — J849 Interstitial pulmonary disease, unspecified: Secondary | ICD-10-CM

## 2023-04-23 NOTE — Progress Notes (Signed)
 Daily Session Note  Patient Details  Name: Brandy Medina MRN: [id number] Date of Birth: Mar 08, 1950 Referring Provider:   Doristine Devoid Pulmonary Rehab Walk Test from 02/20/2023 in [facility]  Referring Provider Mannam       Encounter Date: 04/23/2023  Check In:  Session Check In - 04/23/23 3474       Check-In   Supervising physician immediately available to respond to emergencies [facility] MD immediately available    Physician(s) Jari Favre, PA    Location [facility]    Staff Present Raford Pitcher, MS, ACSM-CEP, Exercise Physiologist;Casey Synthia Innocent, RN, BSN;Randi Reeve BS, ACSM-CEP, Exercise Physiologist;Samantha Belarus, RD, LDN    Virtual Visit No    Medication changes reported     No    Tobacco Cessation No Change    Warm-up and Cool-down Performed as group-led instruction    Resistance Training Performed Yes    VAD Patient? No    PAD/SET Patient? No      Pain Assessment   Currently in Pain? No/denies             Capillary Blood Glucose: No results found for this or any previous visit (from the past 24 hours).    Social History   Tobacco Use  Smoking Status Former   Current packs/day: 0.00   Average packs/day: 0.5 packs/day for 18.0 years (9.0 ttl pk-yrs)   Types: Cigarettes   Start date: 07/06/1975   Quit date: 07/05/1993   Years since quitting: 29.8  Smokeless Tobacco Never    Goals Met:  Exercise tolerated well No report of concerns or symptoms today Strength training completed today  Goals Unmet:  Not Applicable  Comments: Service time is from 0841 to 0936    Dr. Mechele Collin is Medical Director for Pulmonary Rehab at [facility].

## 2023-04-24 ENCOUNTER — Ambulatory Visit

## 2023-04-24 DIAGNOSIS — Z0000 Encounter for general adult medical examination without abnormal findings: Secondary | ICD-10-CM | POA: Diagnosis not present

## 2023-04-24 NOTE — Progress Notes (Signed)
 Subjective:   Brandy Medina is a 73 y.o. female who presents for Medicare Annual (Subsequent) preventive examination.  Visit Complete: Virtual I connected with  Brandy Medina on 04/24/23 by a audio enabled telemedicine application and verified that I am speaking with the correct person using two identifiers.  Patient Location: Home  Provider Location: Office/Clinic  I discussed the limitations of evaluation and management by telemedicine. The patient expressed understanding and agreed to proceed.  Vital Signs: Because this visit was a virtual/telehealth visit, some criteria may be missing or patient reported. Any vitals not documented were not able to be obtained and vitals that have been documented are patient reported.  Patient Medicare AWV questionnaire was completed by the patient on 04/24/2023; I have confirmed that all information answered by patient is correct and no changes since this date.        Objective:    There were no vitals filed for this visit. There is no height or weight on file to calculate BMI.     03/23/2023   10:02 AM 02/20/2023   10:56 AM 09/16/2022    1:03 PM 06/04/2021    8:20 AM 06/01/2020    6:48 AM 11/27/2015    4:46 PM 11/20/2015    4:23 PM  Advanced Directives  Does Patient Have a Medical Advance Directive? Yes Yes Yes Yes No No No  Type of Estate agent of [place];Living will Living will;Healthcare Power of Attorney Living will;Healthcare Power of Attorney Healthcare Power of Attorney     Would patient like information on creating a medical advance directive?     No - Patient declined      Current Medications (verified) Outpatient Encounter Medications as of 04/24/2023  Medication Sig   acetaminophen (TYLENOL) 500 MG tablet Take 500-1,000 mg by mouth every 6 (six) hours as needed for moderate pain or headache.   acyclovir ointment (ZOVIRAX) 5 % Apply 1 Application topically every 4 (four) hours as needed.   amLODipine  (NORVASC) 5 MG tablet Take 1 tablet (5 mg total) by mouth daily. May take an additional 2.5 mg tablet if systolic blood pressure 160 if needed. (Patient taking differently: Take 2.5 mg by mouth daily.)   azithromycin (ZITHROMAX) 250 MG tablet Take 1 tablet (250 mg total) by mouth daily. Take first 2 tablets together, then 1 every day until finished.   butalbital-acetaminophen-caffeine (FIORICET) 50-325-40 MG tablet Take 1 tablet by mouth 2 (two) times daily as needed for migraine.   chlorpheniramine (CHLOR-TRIMETON) 4 MG tablet Take 4 mg by mouth every 4 (four) hours as needed for allergies.   Cholecalciferol (VITAMIN D) 50 MCG (2000 UT) tablet Take 1,000 Units by mouth daily. Tues and Thurs   dextromethorphan (DELSYM) 30 MG/5ML liquid Take 30 mg by mouth 2 (two) times daily as needed for cough.   empagliflozin (JARDIANCE) 10 MG TABS tablet Take 1 tablet (10 mg total) by mouth daily before breakfast.   epoetin alfa (EPOGEN) 10000 UNIT/ML injection as directed Injection once every 3 weeks for 3 months   gabapentin (NEURONTIN) 100 MG capsule Take 1 capsule (100 mg total) by mouth daily. (Patient not taking: Reported on 03/03/2023)   Homeopathic Products (ARNICARE PAIN RELIEF EX) Apply 1 application. topically daily as needed (pain).   loratadine (CLARITIN) 10 MG tablet Take 10 mg by mouth daily as needed for allergies.   predniSONE (DELTASONE) 20 MG tablet Take 2 tablets (40 mg total) by mouth daily with breakfast for 5 days.  Respiratory Therapy Supplies (FLUTTER) DEVI Use as directed.   rosuvastatin (CRESTOR) 5 MG tablet Take 1 tablet (5 mg total) by mouth every Tuesday.   valACYclovir (VALTREX) 1000 MG tablet One tab po tid   valsartan (DIOVAN) 320 MG tablet Take 1 tablet (320 mg total) by mouth daily.   Wheat Dextrin (BENEFIBER PO) Take 1 Scoop by mouth daily.   No facility-administered encounter medications on file as of 04/24/2023.    Allergies (verified) Avelox [moxifloxacin hcl in nacl],  Avelox [moxifloxacin], Azathioprine, Codeine, Erythromycin, Fexofenadine, Levocetirizine, Lisinopril, Sulfa antibiotics, Sulfonamide derivatives, and Zyrtec [cetirizine]   History: Past Medical History:  Diagnosis Date   Allergic rhinitis    Diabetes (HCC)    HTN (hypertension)    Migraines    PONV (postoperative nausea and vomiting)    Pulmonary fibrosis (HCC)    Past Surgical History:  Procedure Laterality Date   ABDOMINAL HYSTERECTOMY  1989   partial   BIOPSY  06/01/2020   Procedure: BIOPSY;  Surgeon: Kerin Salen, MD;  Location: [facility];  Service: Gastroenterology;;  EGD and COLON   BIOPSY  06/04/2021   Procedure: BIOPSY;  Surgeon: Kerin Salen, MD;  Location: [facility];  Service: Gastroenterology;;   BREAST SURGERY     COLONOSCOPY WITH PROPOFOL N/A 06/01/2020   Procedure: COLONOSCOPY WITH PROPOFOL;  Surgeon: Kerin Salen, MD;  Location: [facility];  Service: Gastroenterology;  Laterality: N/A;   ESOPHAGOGASTRODUODENOSCOPY N/A 06/04/2021   Procedure: ESOPHAGOGASTRODUODENOSCOPY (EGD);  Surgeon: Kerin Salen, MD;  Location: [facility];  Service: Gastroenterology;  Laterality: N/A;   ESOPHAGOGASTRODUODENOSCOPY (EGD) WITH PROPOFOL N/A 06/01/2020   Procedure: ESOPHAGOGASTRODUODENOSCOPY (EGD) WITH PROPOFOL;  Surgeon: Kerin Salen, MD;  Location: [facility];  Service: Gastroenterology;  Laterality: N/A;   POLYPECTOMY  06/01/2020   Procedure: POLYPECTOMY;  Surgeon: Kerin Salen, MD;  Location: [facility];  Service: Gastroenterology;;   SALPINGOOPHORECTOMY Left    Family History  Problem Relation Age of Onset   Renal Disease Brother    Stroke Brother    Bronchitis Mother    Heart disease Mother    Heart disease Father    Heart attack Father    Hypertension Sister    Diabetes Sister    Heart attack Brother    Hypertension Sister    Diabetes Sister    Social History   Socioeconomic History   Marital status: Married    Spouse name: Not on file   Number of children:  Not on file   Years of education: Not on file   Highest education level: Not on file  Occupational History   Not on file  Tobacco Use   Smoking status: Former    Current packs/day: 0.00    Average packs/day: 0.5 packs/day for 18.0 years (9.0 ttl pk-yrs)    Types: Cigarettes    Start date: 07/06/1975    Quit date: 07/05/1993    Years since quitting: 29.8   Smokeless tobacco: Never  Substance and Sexual Activity   Alcohol use: Yes    Comment: Occasional Wine or Martini   Drug use: Never   Sexual activity: Not Currently  Other Topics Concern   Not on file  Social History Narrative   Are you right handed or left handed? Right ended   Are you currently employed ? No   What is your current occupation? Retired   Do you live at home alone? No    Who lives with you?  Lives with husband    What type of home do you  live in: 1 story or 2 story? Lives in a two story home.        Social Drivers of Corporate investment banker Strain: Not on file  Food Insecurity: Not on file  Transportation Needs: Not on file  Physical Activity: Not on file  Stress: Not on file  Social Connections: Not on file    Tobacco Counseling Counseling given: Not Answered   Clinical Intake:                        Activities of Daily Living     No data to display          Patient Care Team: Dorothyann Peng, MD as PCP - General (Internal Medicine) Thomasene Ripple, DO as PCP - Cardiology (Cardiology) Serena Croissant, MD as Consulting Physician (Hematology and Oncology) Glendale Chard, DO as Consulting Physician (Neurology)  Indicate any recent Medical Services you may have received from other than [facility] providers in the past year (date may be approximate).     Assessment:   This is a routine wellness examination for Estelita.  Hearing/Vision screen No results found.   Goals Addressed   None    Depression Screen    02/20/2023   10:44 AM 02/03/2023    2:09 PM  PHQ 2/9 Scores  PHQ - 2  Score 0 0  PHQ- 9 Score 0 0    Fall Risk    04/23/2023    8:22 AM 04/02/2023    8:28 AM 03/31/2023    8:18 AM 03/26/2023    8:23 AM 03/24/2023    8:22 AM  Fall Risk   Falls in the past year? 0 0 0 0 0  Number falls in past yr: 0 0 0 0 0  Injury with Fall? 0 0 0 0 0  Risk for fall due to : No Fall Risks No Fall Risks No Fall Risks No Fall Risks No Fall Risks  Follow up Falls evaluation completed;Falls prevention discussed Falls evaluation completed;Falls prevention discussed Falls evaluation completed;Falls prevention discussed Falls evaluation completed;Falls prevention discussed Falls evaluation completed;Falls prevention discussed    MEDICARE RISK AT HOME:    TIMED UP AND GO:  Was the test performed?  No    Cognitive Function:        Immunizations Immunization History  Administered Date(s) Administered   Fluad Quad(high Dose 65+) 10/07/2022   H1N1 03/27/2008   Influenza Split 01/09/2012, 11/29/2012, 09/28/2014, 10/24/2019   Influenza, High Dose Seasonal PF 10/29/2018   Influenza,inj,Quad PF,6+ Mos 10/02/2015   PFIZER(Purple Top)SARS-COV-2 Vaccination 03/07/2019, 04/01/2019, 10/25/2019   PNEUMOCOCCAL CONJUGATE-20 02/10/2023   Pfizer(Comirnaty)Fall Seasonal Vaccine 12 years and older 10/03/2022   Pneumococcal Conjugate-13 09/19/2014   Pneumococcal Polysaccharide-23 10/02/2015   Zoster Recombinant(Shingrix) 03/18/2018, 07/15/2018    TDAP status: Due, Education has been provided regarding the importance of this vaccine. Advised may receive this vaccine at local pharmacy or Health Dept. Aware to provide a copy of the vaccination record if obtained from local pharmacy or Health Dept. Verbalized acceptance and understanding.  Flu Vaccine status: Up to date  Pneumococcal vaccine status: Up to date  Covid-19 vaccine status: Information provided on how to obtain vaccines.   Qualifies for Shingles Vaccine? Yes   Zostavax completed Yes   Shingrix Completed?: Yes  Screening  Tests Health Maintenance  Topic Date Due   Medicare Annual Wellness (AWV)  02/01/2023   COVID-19 Vaccine (5 - Pfizer risk 2024-25 season) 04/02/2023   Hepatitis  C Screening  02/03/2024 (Originally 09/01/1968)   DTaP/Tdap/Td (1 - Tdap) 03/02/2024 (Originally 09/01/1969)   MAMMOGRAM  10/09/2024   Colonoscopy  06/02/2030   Pneumonia Vaccine 37+ Years old  Completed   INFLUENZA VACCINE  Completed   DEXA SCAN  Completed   Zoster Vaccines- Shingrix  Completed   HPV VACCINES  Aged Out    Health Maintenance  Health Maintenance Due  Topic Date Due   Medicare Annual Wellness (AWV)  02/01/2023   COVID-19 Vaccine (5 - Pfizer risk 2024-25 season) 04/02/2023    Colorectal cancer screening: Type of screening: Colonoscopy. Completed May 6th 2022. Repeat every 10 years  Mammogram status: Completed 10/10/2022. Repeat every year  Bone Density status: Completed 03/24/2023. Results reflect: Bone density results: NORMAL. Repeat every 0 years.  Lung Cancer Screening: (Low Dose CT Chest recommended if Age 12-80 years, 20 pack-year currently smoking OR have quit w/in 15years.) does not qualify.   Lung Cancer Screening Referral: n/a  Additional Screening:  Hepatitis C Screening: does qualify; postponed  Vision Screening: Recommended annual ophthalmology exams for early detection of glaucoma and other disorders of the eye. Is the patient up to date with their annual eye exam?  Yes  Who is the provider or what is the name of the office in which the patient attends annual eye exams? [email or web address] If pt is not established with a provider, would they like to be referred to a provider to establish care?  Already established.  .   Dental Screening: Recommended annual dental exams for proper oral hygiene  Diabetic Foot Exam: Not diabetic  Community Resource Referral / Chronic Care Management: CRR required this visit?  No   CCM required this visit?  No     Plan:     I have personally reviewed and noted  the following in the patient's chart:   Medical and social history Use of alcohol, tobacco or illicit drugs  Current medications and supplements including opioid prescriptions. Patient is not currently taking opioid prescriptions. Functional ability and status Nutritional status Physical activity Advanced directives List of other physicians Hospitalizations, surgeries, and ER visits in previous 12 months Vitals Screenings to include cognitive, depression, and falls Referrals and appointments  In addition, I have reviewed and discussed with patient certain preventive protocols, quality metrics, and best practice recommendations. A written personalized care plan for preventive services as well as general preventive health recommendations were provided to patient.     Marlyn Corporal, CMA   04/24/2023   After Visit Summary: (MyChart) Due to this being a telephonic visit, the after visit summary with patients personalized plan was offered to patient via MyChart   Nurse Notes: Patient was very pleasant.

## 2023-04-27 ENCOUNTER — Encounter: Payer: Self-pay | Admitting: Internal Medicine

## 2023-04-28 ENCOUNTER — Encounter (HOSPITAL_COMMUNITY)
Admission: RE | Admit: 2023-04-28 | Discharge: 2023-04-28 | Disposition: A | Discharge status: Home / Self Care | Payer: Medicare PPO | Source: Ambulatory Visit | Attending: Pulmonary Disease | Admitting: Pulmonary Disease

## 2023-04-28 VITALS — Wt 156.7 lb

## 2023-04-28 DIAGNOSIS — J849 Interstitial pulmonary disease, unspecified: Secondary | ICD-10-CM | POA: Diagnosis not present

## 2023-04-28 NOTE — Progress Notes (Signed)
 Daily Session Note  Patient Details  Name: Brandy Medina MRN: [id number] Date of Birth: 1950/11/15 Referring Provider:   Doristine Devoid Pulmonary Rehab Walk Test from [facility]/20/2023 in [facility]  Referring Provider Mannam       Encounter Date: 04/28/2023  Check In:  Session Check In - 04/28/23 [id number]       Check-In   Supervising physician immediately available to respond to emergencies [facility] MD immediately available    Physician(s) Jari Favre, PA    Location [facility]    Staff Present Raford Pitcher, MS, ACSM-CEP, Exercise Physiologist;Alanea Woolridge Synthia Innocent, RN, BSN;Randi Reeve BS, ACSM-CEP, Exercise Physiologist    Virtual Visit No    Medication changes reported     No    Fall or balance concerns reported    No    Tobacco Cessation No Change    Warm-up and Cool-down Performed as group-led instruction    Resistance Training Performed Yes    VAD Patient? No    PAD/SET Patient? No      Pain Assessment   Currently in Pain? No/denies    Multiple Pain Sites No             Capillary Blood Glucose: No results found for this or any previous visit (from the past 24 hours).   Exercise Prescription Changes - 04/28/23 0900       Response to Exercise   Blood Pressure (Admit) 108/64    Blood Pressure (Exercise) 118/62    Blood Pressure (Exit) 102/62    Heart Rate (Admit) 83 bpm    Heart Rate (Exercise) 119 bpm    Heart Rate (Exit) 91 bpm    Oxygen Saturation (Admit) 92 %    Oxygen Saturation (Exercise) 92 %    Oxygen Saturation (Exit) 97 %    Rating of Perceived Exertion (Exercise) 13    Perceived Dyspnea (Exercise) 1    Duration Continue with 30 min of aerobic exercise without signs/symptoms of physical distress.    Intensity THRR New   increased to 133     Progression   Progression Continue to progress workloads to maintain intensity without signs/symptoms of physical distress.       Resistance Training   Training Prescription Yes    Weight blue bands    Reps 10-15    Time 10 Minutes      Treadmill   MPH 2.4    Grade 1    Minutes 15    METs 3      Recumbant Elliptical   Level 2    RPM 49    Watts 58    Minutes 15    METs 3             Social History   Tobacco Use  Smoking Status Former   Current packs/day: 0.00   Average packs/day: 0.5 packs/day for 18.0 years (9.0 ttl pk-yrs)   Types: Cigarettes   Start date: 07/06/1975   Quit date: 07/05/1993   Years since quitting: 29.8  Smokeless Tobacco Never    Goals Met:  Proper associated with RPD/PD & O2 Sat Independence with exercise equipment Exercise tolerated well No report of concerns or symptoms today Strength training completed today  Goals Unmet:  Not Applicable  Comments: Service time is from 0808 to 0939.    Dr. Mechele Collin is Medical Director for Pulmonary Rehab at [facility].

## 2023-04-29 ENCOUNTER — Telehealth (HOSPITAL_COMMUNITY): Payer: Self-pay

## 2023-04-29 ENCOUNTER — Ambulatory Visit: Admitting: Pulmonary Disease

## 2023-04-29 ENCOUNTER — Encounter: Payer: Self-pay | Admitting: Pulmonary Disease

## 2023-04-29 VITALS — BP 122/73 | HR 92 | Temp 97.7°F | Ht 64.5 in | Wt 157.8 lb

## 2023-04-29 DIAGNOSIS — J849 Interstitial pulmonary disease, unspecified: Secondary | ICD-10-CM | POA: Diagnosis not present

## 2023-04-29 NOTE — Telephone Encounter (Signed)
 Called to request pt to come at 8:30 am tomorrow. LVM

## 2023-04-29 NOTE — Patient Instructions (Signed)
 VISIT SUMMARY:  During your visit, we discussed your persistent cough and mucus production, which have improved since your recent treatment for acute bronchitis. We also reviewed your chronic sinus congestion and post-nasal drip, and your concerns about starting a new medication for pulmonary fibrosis. Additionally, we talked about your eligibility for a COVID-19 booster.  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 It is secondary to Sjogren's syndrome, an autoimmune disease. We discussed starting a medication called Ofev, but you have decided against it for now due to concerns about side effects.  -ACUTE BRONCHITIS: Acute bronchitis is an infection of the airways that causes coughing and mucus production. You were treated with prednisone and azithromycin, which have improved your symptoms. Your mucus is now clearer, and there is no more blood. Continue to monitor your symptoms and contact us if they worsen, as you may need another course of prednisone.  -CHRONIC SINUS CONGESTION AND POST NASAL DRIP: Chronic sinus congestion and post-nasal drip are ongoing issues where mucus drips down the back of your throat, often exacerbated by Sjogren's syndrome. Previous treatments with nasal sprays have not been well-tolerated due to nosebleeds and dryness. Managing these symptoms remains challenging.  -COVID-19 BOOSTER: You are eligible to receive a COVID-19 booster shot at this time. This booster will help enhance your protection against COVID-19. Please proceed with getting the vaccination.  INSTRUCTIONS:  Please monitor your symptoms and contact us if your cough or mucus production worsens. Proceed with getting your COVID-19 booster shot. Follow up with [place] if you have any new or worsening symptoms.

## 2023-04-29 NOTE — Progress Notes (Signed)
 Brandy Medina    [id number]    10/01/1950  Primary Care Physician:Sanders, Melina Schools, MD  Referring Physician: Dorothyann Peng, MD [address] [place],  [place] [address]  Chief complaint: Follow up for interstitial lung disease, Sjogren's syndrome  HPI: 73 y.o. patient with history of von Willebrand's disease, Sjogren's syndrome, maintained on Plaquenil which is stopped due to darkening skin.  Started on Imuran by Dr. Dierdre Forth, rheumatology Diagnosed with streptococcal pneumonia in February 2022 which is treated with antibiotics CT scan shows progressive UIP fibrosis and she has been referred to pulmonary clinic for further evaluation  Complains of minimal cough, chronic dyspnea on exertion which is progressive in nature Has dry mouth, dry eyes  Azathioprine was held in February 2022 due to pancytopenia, elevated LFTs.  She is undergoing work-up with Dr. Pamelia Hoit for this.  She does have formal diagnosis of von Willebrand's disease but on repeat testing does not have any evidence of this.  She may need a bone marrow biopsy  She started Esbriet on 04/26/2020.  It was initially well-tolerated but last week she developed side effects of dizziness, lightheadedness, headache, loss of appetite.  She has held the [place] and is now feeling better. Discussed starting Ofev but decided against it in 2024.  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  She has done cushions in the living room couch but exposures is minimal Smoking history: 9  pack-year smoker.  Quit in 1995 Travel history: Originally from [place].  No significant recent travel Relevant family history: No family history of lung disease  Interim history: Discussed the use of AI scribe software for clinical note transcription with the patient, who gave verbal consent to proceed.  History of Present Illness [name] is a 73 year old female with pulmonary fibrosis  and Sjogren's syndrome who presents with persistent cough and mucus production.  She has been experiencing a persistent cough and mucus production. The mucus has transitioned from dark to a clearer, sticky consistency, sometimes resembling 'evaporated milk color.' She can still bring up mucus when coughing, although the frequency and severity have decreased. There is persistent drainage and occasional throat irritation. No current blood in her mucus, and her congestion is clearer.  She recently had an episode of acute bronchitis diagnosed at an urgent care facility, characterized by significant coughing and production of thick, dark mucus, occasionally with blood. The blood was attributed to frequent epistaxis. She was treated with prednisone and azithromycin for five days, which improved her symptoms. This episode began on April 20, 2023, and she completed her medication course by the following Friday.  She has a history of interstitial lung disease, specifically pulmonary fibrosis secondary to Sjogren's syndrome. Chronic issues with sinus congestion and post-nasal drip have been difficult to manage due to her Sjogren's syndrome. She has experienced bleeding with nasal steroid sprays, likely due to dryness and thinning of the nasal lining.  She mentions back pain, which she attributes to arthritis, and is under the care of a neurologist for this issue. She is concerned about whether her lung condition could be contributing to her back pain, although she acknowledges that coughing can cause muscle soreness.  She describes a previous episode of neck pain on the left side, extending under the ear, which she was unsure if it was related to an ear infection. No current ear infection symptoms.     Outpatient Encounter Medications as of  04/29/2023  Medication Sig   acetaminophen (TYLENOL) 500 MG tablet Take 500-1,000 mg by mouth every 6 (six) hours as needed for moderate pain or headache.   amLODipine  (NORVASC) 5 MG tablet Take 1 tablet (5 mg total) by mouth daily. May take an additional 2.5 mg tablet if systolic blood pressure 160 if needed. (Patient taking differently: Take 2.5 mg by mouth daily.)   butalbital-acetaminophen-caffeine (FIORICET) 50-325-40 MG tablet Take 1 tablet by mouth 2 (two) times daily as needed for migraine.   chlorpheniramine (CHLOR-TRIMETON) 4 MG tablet Take 4 mg by mouth every 4 (four) hours as needed for allergies.   Cholecalciferol (VITAMIN D) 50 MCG (2000 UT) tablet Take 1,000 Units by mouth daily. Tues and Thurs   dextromethorphan (DELSYM) 30 MG/5ML liquid Take 30 mg by mouth 2 (two) times daily as needed for cough.   epoetin alfa (EPOGEN) 10000 UNIT/ML injection as directed Injection once every 3 weeks for 3 months   Homeopathic Products (ARNICARE PAIN RELIEF EX) Apply 1 application. topically daily as needed (pain).   Respiratory Therapy Supplies (FLUTTER) DEVI Use as directed.   rosuvastatin (CRESTOR) 5 MG tablet Take 1 tablet (5 mg total) by mouth every Tuesday.   valsartan (DIOVAN) 320 MG tablet Take 1 tablet (320 mg total) by mouth daily.   Wheat Dextrin (BENEFIBER PO) Take 1 Scoop by mouth daily.   azithromycin (ZITHROMAX) 250 MG tablet Take 1 tablet (250 mg total) by mouth daily. Take first 2 tablets together, then 1 every day until finished. (Patient not taking: Reported on 04/29/2023)   empagliflozin (JARDIANCE) 10 MG TABS tablet Take 1 tablet (10 mg total) by mouth daily before breakfast. (Patient not taking: Reported on 04/29/2023)   No facility-administered encounter medications on file as of 04/29/2023.  , Physical Exam: Blood pressure 122/73, pulse 92, temperature 97.7 F (36.5 C), height 5' 4.5" (1.638 m), weight 157 lb 12.8 oz (71.6 kg), SpO2 100%. Gen:      No acute distress HEENT:  EOMI, sclera anicteric Neck:     No masses; no thyromegaly Lungs:    Clear to auscultation bilaterally; normal respiratory effort CV:         Regular rate and rhythm; no  murmurs Abd:      + bowel sounds; soft, non-tender; no palpable masses, no distension Ext:    No edema; adequate peripheral perfusion Skin:      Warm and dry; no rash Neuro: alert and oriented x 3 Psych: normal mood and affect   Data Reviewed: Imaging: CT chest 02/20/2020-moderate to severe pulmonary fibrosis in UIP pattern.  Worsened since previous scan in 2010, groundglass opacities in the peripheral right upper lobe.   High-resolution CT 12/13/2021-slightly worsened pulmonary fibrosis in UIP pattern.  High resolution CT 01/11/2023-continued progression of UIP pattern pulmonary fibrosis I have reviewed the images personally.  PFTs: 04/05/2020 FVC 2.66 [108%], FEV1 2.18 [114%], DLCO 8.23 [41%]  11/13/2021 FVC 2.79 [94%], FEV1 2.37 [105%], F/F 85, TLC 4.39 [86%], DLCO 10.33 [53%] Moderate diffusion defect  02/10/2023 FVC 2.76 [94%], FEV1 2.08 [162%], F/F76, TLC 5.81 [114%], DLCO 10.98 (56%] Moderate diffusion defect  Labs: CMP 05/14/2020- AST 18, ALT 9 Assessment & Plan Pulmonary Fibrosis secondary to Sjogren's Syndrome   Pulmonary fibrosis secondary to Sjogren's syndrome. Discussed initiating Ofev, but she is hesitant due to previous adverse reactions to medications like aspirin and concerns about potential side effects. She and her husband have decided against starting Ofev at this time.  She was previously intolerant  of both Imuran and pirfenidone in the past.  Acute Bronchitis   Diagnosed with acute bronchitis on April 20, 2023, treated with prednisone and azithromycin for five days. Reports improvement but continues to experience mucus production, now clearer and less discolored. No more hemoptysis. No active infection suspected; lingering symptoms attributed to residual bronchitis, expected to resolve over time.   - Monitor symptoms and contact if she worsens for potential prescription of prednisone.  Chronic Sinus Congestion and Post Nasal Drip   Chronic sinus congestion and  post nasal drip, exacerbated by Sjogren's syndrome. Previous treatments with nasal sprays not tolerated due to epistaxis and dryness. Managing symptoms is challenging given her condition.  COVID-19 Booster   Inquired about receiving a COVID-19 booster. Confirmed appropriate to receive the booster at this time.   - Advise to proceed with COVID-19 booster vaccination.  Plan/Recommendations: Hold off on antifibrotic therapy per patient preference Continue monitoring bronchitis symptoms Follow-up in 6 months  Chilton Greathouse MD [facility] 04/29/2023, 2:23 PM  CC: Dorothyann Peng, MD

## 2023-04-30 ENCOUNTER — Encounter (HOSPITAL_BASED_OUTPATIENT_CLINIC_OR_DEPARTMENT_OTHER): Payer: Self-pay | Admitting: Emergency Medicine

## 2023-04-30 ENCOUNTER — Encounter (HOSPITAL_COMMUNITY)
Admission: RE | Admit: 2023-04-30 | Discharge: 2023-04-30 | Disposition: A | Discharge status: Home / Self Care | Payer: Medicare PPO | Source: Ambulatory Visit | Attending: Pulmonary Disease | Admitting: Pulmonary Disease

## 2023-04-30 ENCOUNTER — Emergency Department (HOSPITAL_BASED_OUTPATIENT_CLINIC_OR_DEPARTMENT_OTHER)

## 2023-04-30 ENCOUNTER — Ambulatory Visit: Payer: Self-pay | Admitting: Internal Medicine

## 2023-04-30 ENCOUNTER — Emergency Department (HOSPITAL_BASED_OUTPATIENT_CLINIC_OR_DEPARTMENT_OTHER)
Admission: EM | Admit: 2023-04-30 | Discharge: 2023-04-30 | Disposition: A | Discharge status: Home / Self Care | Attending: Emergency Medicine | Admitting: Emergency Medicine

## 2023-04-30 ENCOUNTER — Other Ambulatory Visit: Payer: Self-pay

## 2023-04-30 DIAGNOSIS — I10 Essential (primary) hypertension: Secondary | ICD-10-CM | POA: Diagnosis not present

## 2023-04-30 DIAGNOSIS — J849 Interstitial pulmonary disease, unspecified: Secondary | ICD-10-CM

## 2023-04-30 DIAGNOSIS — R55 Syncope and collapse: Secondary | ICD-10-CM | POA: Diagnosis not present

## 2023-04-30 DIAGNOSIS — E162 Hypoglycemia, unspecified: Secondary | ICD-10-CM | POA: Insufficient documentation

## 2023-04-30 DIAGNOSIS — E11649 Type 2 diabetes mellitus with hypoglycemia without coma: Secondary | ICD-10-CM | POA: Diagnosis not present

## 2023-04-30 DIAGNOSIS — J8410 Pulmonary fibrosis, unspecified: Secondary | ICD-10-CM | POA: Diagnosis not present

## 2023-04-30 LAB — URINALYSIS, ROUTINE W REFLEX MICROSCOPIC
Bilirubin Urine: NEGATIVE
Glucose, UA: >=500 mg/dL — AB
Hgb urine dipstick: NEGATIVE
Ketones, ur: NEGATIVE mg/dL
Leukocytes,Ua: NEGATIVE
Nitrite: NEGATIVE
Protein, ur: NEGATIVE mg/dL
Specific Gravity, Urine: 1.01 (ref 1.005–1.030)
pH: 5.5 (ref 5.0–8.0)

## 2023-04-30 LAB — CBC WITH DIFFERENTIAL/PLATELET
Abs Immature Granulocytes: 0.02 10*3/uL (ref 0.00–0.07)
Basophils Absolute: 0 10*3/uL (ref 0.0–0.1)
Basophils Relative: 1 %
Eosinophils Absolute: 0.1 10*3/uL (ref 0.0–0.5)
Eosinophils Relative: 2 %
HCT: 29.6 % — ABNORMAL LOW (ref 36.0–46.0)
Hemoglobin: 9.8 g/dL — ABNORMAL LOW (ref 12.0–15.0)
Immature Granulocytes: 1 %
Lymphocytes Relative: 25 %
Lymphs Abs: 1.1 10*3/uL (ref 0.7–4.0)
MCH: 31.9 pg (ref 26.0–34.0)
MCHC: 33.1 g/dL (ref 30.0–36.0)
MCV: 96.4 fL (ref 80.0–100.0)
Monocytes Absolute: 0.8 10*3/uL (ref 0.1–1.0)
Monocytes Relative: 18 %
Neutro Abs: 2.4 10*3/uL (ref 1.7–7.7)
Neutrophils Relative %: 53 %
Platelets: 275 10*3/uL (ref 150–400)
RBC: 3.07 MIL/uL — ABNORMAL LOW (ref 3.87–5.11)
RDW: 15.9 % — ABNORMAL HIGH (ref 11.5–15.5)
WBC: 4.4 10*3/uL (ref 4.0–10.5)
nRBC: 0 % (ref 0.0–0.2)

## 2023-04-30 LAB — URINALYSIS, MICROSCOPIC (REFLEX)
RBC / HPF: NONE SEEN RBC/hpf (ref 0–5)
WBC, UA: NONE SEEN WBC/hpf (ref 0–5)

## 2023-04-30 LAB — BASIC METABOLIC PANEL WITH GFR
Anion gap: 7 (ref 5–15)
BUN: 30 mg/dL — ABNORMAL HIGH (ref 8–23)
CO2: 25 mmol/L (ref 22–32)
Calcium: 9 mg/dL (ref 8.9–10.3)
Chloride: 100 mmol/L (ref 98–111)
Creatinine, Ser: 1.02 mg/dL — ABNORMAL HIGH (ref 0.44–1.00)
GFR, Estimated: 58 mL/min — ABNORMAL LOW (ref >60)
Glucose, Bld: 91 mg/dL (ref 70–99)
Potassium: 5.1 mmol/L (ref 3.5–5.1)
Sodium: 132 mmol/L — ABNORMAL LOW (ref 135–145)

## 2023-04-30 LAB — CBG MONITORING, ED
Glucose-Capillary: 75 mg/dL (ref 70–99)
Glucose-Capillary: 97 mg/dL (ref 70–99)

## 2023-04-30 LAB — TROPONIN I (HIGH SENSITIVITY): Troponin I (High Sensitivity): 6 ng/L (ref <18)

## 2023-04-30 NOTE — Discharge Instructions (Addendum)
 Please see your PCP next week to discuss prevention of hypoglycemia

## 2023-04-30 NOTE — ED Notes (Signed)
 8 oz orange juice and peanut butter crackers offered to patient while waiting to see provider .

## 2023-04-30 NOTE — Progress Notes (Signed)
 Daily Session Note  Patient Details  Name: Brandy Medina MRN: [id number] Date of Birth: Jul 30, 1950 Referring Provider:   Doristine Devoid Pulmonary Rehab Walk Test from 02/20/2023 in [facility]  Referring Provider Mannam       Encounter Date: 04/30/2023  Check In:  Session Check In - 04/30/23 9563       Check-In   Supervising physician immediately available to respond to emergencies [facility] MD immediately available    Physician(s) Carlyon Shadow, NP    Location [facility]    Staff Present Raford Pitcher, MS, ACSM-CEP, Exercise Physiologist;Casey Synthia Innocent, RN, BSN;Aiza Vollrath BS, ACSM-CEP, Exercise Physiologist;Samantha Belarus, RD, LDN    Virtual Visit No    Medication changes reported     No    Fall or balance concerns reported    No    Tobacco Cessation No Change    Warm-up and Cool-down Performed as group-led instruction    Resistance Training Performed Yes    VAD Patient? No    PAD/SET Patient? No      Pain Assessment   Currently in Pain? No/denies    Multiple Pain Sites No             Capillary Blood Glucose: No results found for this or any previous visit (from the past 24 hours).    Social History   Tobacco Use  Smoking Status Former   Current packs/day: 0.00   Average packs/day: 0.5 packs/day for 18.0 years (9.0 ttl pk-yrs)   Types: Cigarettes   Start date: 07/06/1975   Quit date: 07/05/1993   Years since quitting: 29.8  Smokeless Tobacco Never    Goals Met:  Exercise tolerated well No report of concerns or symptoms today Strength training completed today  Goals Unmet:  Not Applicable  Comments: Service time is from 0835 to 0946.    Dr. Mechele Collin is Medical Director for Pulmonary Rehab at [facility].

## 2023-04-30 NOTE — ED Provider Notes (Signed)
 [facility] [facility] Provider Note   CSN: [id number] Arrival date & time: 04/30/23  1544     History  Chief Complaint  Patient presents with   Hypoglycemia    Brandy Medina is a 73 y.o. female. The patient, with a history of pulmonary fibrosis, bronchiectasis, and reactive hypoglycemia, presents with recurrent low blood sugar readings. She reports that her blood sugar has been consistently low for the past few days, with readings ranging from 61 to 87. She has not experienced the typical symptoms of hypoglycemia, such as sweating and fatigue, which she used to experience in the past. She has been managing her blood sugar levels by eating regularly and monitoring her blood sugar levels at home.  The patient was recently started on Jardiance by a new doctor to protect her kidneys, as she was diagnosed with CKD 2. However, she had to stop taking the medication due to severe morning fatigue. She was also recently treated with prednisone and azithromycin for acute bronchitis. She has a history of pulmonary fibrosis and bronchiectasis, which cause chronic shortness of breath.     Home Medications Prior to Admission medications   Medication Sig Start Date End Date Taking? Authorizing Provider  acetaminophen (TYLENOL) 500 MG tablet Take 500-1,000 mg by mouth every 6 (six) hours as needed for moderate pain or headache.    [provider]  amLODipine (NORVASC) 5 MG tablet Take 1 tablet (5 mg total) by mouth daily. May take an additional 2.5 mg tablet if systolic blood pressure 160 if needed. Patient taking differently: Take 2.5 mg by mouth daily. 06/20/22   Ronney Asters, NP  azithromycin (ZITHROMAX) 250 MG tablet Take 1 tablet (250 mg total) by mouth daily. Take first 2 tablets together, then 1 every day until finished. Patient not taking: Reported on 04/29/2023 04/20/23   Tomi Bamberger, PA-C  butalbital-acetaminophen-caffeine (FIORICET) [phone] MG  tablet Take 1 tablet by mouth 2 (two) times daily as needed for migraine.    [provider]  chlorpheniramine (CHLOR-TRIMETON) 4 MG tablet Take 4 mg by mouth every 4 (four) hours as needed for allergies.    [provider]  Cholecalciferol (VITAMIN D) 50 MCG (2000 UT) tablet Take 1,000 Units by mouth daily. Tues and Thurs    [provider]  dextromethorphan (DELSYM) 30 MG/5ML liquid Take 30 mg by mouth 2 (two) times daily as needed for cough.    [provider]  empagliflozin (JARDIANCE) 10 MG TABS tablet Take 1 tablet (10 mg total) by mouth daily before breakfast. Patient not taking: Reported on 04/29/2023 02/09/23   Dorothyann Peng, MD  epoetin alfa (EPOGEN) 10000 UNIT/ML injection as directed Injection once every 3 weeks for 3 months    [provider]  Homeopathic Products (ARNICARE PAIN RELIEF EX) Apply 1 application. topically daily as needed (pain).    [provider]  Respiratory Therapy Supplies (FLUTTER) DEVI Use as directed. 06/29/19   Coral Ceo, NP  rosuvastatin (CRESTOR) 5 MG tablet Take 1 tablet (5 mg total) by mouth every Tuesday. 02/03/23   Dorothyann Peng, MD  valsartan (DIOVAN) 320 MG tablet Take 1 tablet (320 mg total) by mouth daily. 02/03/23   Dorothyann Peng, MD  Wheat Dextrin (BENEFIBER PO) Take 1 Scoop by mouth daily.    [provider]      Allergies    Avelox [moxifloxacin hcl in nacl], Avelox [moxifloxacin], Azathioprine, Codeine, Erythromycin, Fexofenadine, Levocetirizine, Lisinopril, Sulfa antibiotics, Sulfonamide  derivatives, and Zyrtec [cetirizine]    Review of Systems   Review of Systems  Constitutional:  Negative for activity change, appetite change, chills and fever.  HENT:  Negative for congestion.   Eyes:  Negative for pain.  Respiratory:  Negative for cough.   Cardiovascular:  Negative for chest pain.  Gastrointestinal:  Negative for abdominal pain, constipation, diarrhea, nausea and vomiting.   Skin:  Negative for rash.  Neurological:  Negative for tremors, seizures, syncope and weakness.  Psychiatric/Behavioral:  Negative for confusion.   All other systems reviewed and are negative.   Physical Exam Updated Vital Signs BP 126/88   Pulse 80   Temp 98.9 F (37.2 C)   Resp 16   SpO2 99%  Physical Exam Vitals reviewed.  Constitutional:      Appearance: Normal appearance.  HENT:     Head: Normocephalic and atraumatic.     Nose: Nose normal.     Mouth/Throat:     Mouth: Mucous membranes are moist.  Eyes:     Extraocular Movements: Extraocular movements intact.  Cardiovascular:     Rate and Rhythm: Normal rate and regular rhythm.  Pulmonary:     Effort: Pulmonary effort is normal.     Breath sounds: Normal breath sounds.  Abdominal:     General: Abdomen is flat. Bowel sounds are normal. There is no distension.     Palpations: Abdomen is soft. There is no mass.  Musculoskeletal:        General: Normal range of motion.  Skin:    General: Skin is warm.     Capillary Refill: Capillary refill takes less than 2 seconds.  Neurological:     General: No focal deficit present.     Mental Status: She is alert.  Psychiatric:        Mood and Affect: Mood normal.        Behavior: Behavior normal.     ED Results / Procedures / Treatments   Labs (all labs ordered are listed, but only abnormal results are displayed) Labs Reviewed  CBC WITH DIFFERENTIAL/PLATELET - Abnormal; Notable for the following components:      Result Value   RBC 3.07 (*)    Hemoglobin 9.8 (*)    HCT 29.6 (*)    RDW 15.9 (*)    All other components within normal limits  BASIC METABOLIC PANEL WITH GFR - Abnormal; Notable for the following components:   Sodium 132 (*)    BUN 30 (*)    Creatinine, Ser 1.02 (*)    GFR, Estimated 58 (*)    All other components within normal limits  URINALYSIS, ROUTINE W REFLEX MICROSCOPIC - Abnormal; Notable for the following components:   Glucose, UA >=500 (*)     All other components within normal limits  URINALYSIS, MICROSCOPIC (REFLEX) - Abnormal; Notable for the following components:   Bacteria, UA RARE (*)    All other components within normal limits  CBG MONITORING, ED  CBG MONITORING, ED  TROPONIN I (HIGH SENSITIVITY)    EKG None  Radiology DG Chest Portable 1 View Result Date: 04/30/2023 CLINICAL DATA:  Presyncope EXAM: PORTABLE CHEST 1 VIEW COMPARISON:  Chest x-ray 11/13/2021.  Chest CT 12/29/2022. FINDINGS: Again seen are changes of pulmonary fibrosis in the lungs bilaterally, right greater than left. There is no new focal lung infiltrate. Costophrenic angles are clear. There is no pneumothorax. The cardiomediastinal limits. Acute fractures are identified grade IMPRESSION: Stable changes of pulmonary fibrosis.  No new focal  lung infiltrate. Electronically Signed   By: Darliss Cheney M.D.   On: 04/30/2023 18:45    Procedures Procedures - none    Medications Ordered in ED Medications - No data to display  ED Course/ Medical Decision Making/ A&P Clinical Course as of 04/30/23 1924  Thu Apr 30, 2023  1904 Stable RESIDENT Hx of hypoglycemia. Uncertain etiology.   [CC]    Clinical Course User Index [CC] Glyn Ade, MD                                  Medical Decision Making:   Brandy Medina is a 73 y.o. female who presented to the ED today with hypoglycemia detailed above.  Complete initial physical exam performed, notably the patient had a completely normal physical examination.    Reviewed and confirmed nursing documentation for past medical history, family history, social history.    Initial Assessment:   With the patient's presentation of recurrent hypoglycemia resolved with PO intake and asymptomatic, most likely diagnosis is recurrence of reactive hypoglycemia. She has a known history and has no other evidence of infection noted.   Other diagnoses were considered including (but not limited to) infection, mediation  induced, refractory 2/2 poor po intake. These are considered less likely due to history of present illness and physical exam findings.     Initial Plan:   Screening labs including CBC and Metabolic panel to evaluate for infectious or metabolic etiology of disease.  CXR to evaluate for structural/infectious intrathoracic pathology.  Objective evaluation as below reviewed   Initial Study Results:   Laboratory  All laboratory results reviewed without evidence of clinically relevant pathology.   Exceptions include: CBC with chronic anemia that is stable from previous, no abnormalities on CMP    Radiology:  All images reviewed independently. Agree with radiology report at this time.   DG Chest Portable 1 View Result Date: 04/30/2023 CLINICAL DATA:  Presyncope EXAM: PORTABLE CHEST 1 VIEW COMPARISON:  Chest x-ray 11/13/2021.  Chest CT 12/29/2022. FINDINGS: Again seen are changes of pulmonary fibrosis in the lungs bilaterally, right greater than left. There is no new focal lung infiltrate. Costophrenic angles are clear. There is no pneumothorax. The cardiomediastinal limits. Acute fractures are identified grade IMPRESSION: Stable changes of pulmonary fibrosis.  No new focal lung infiltrate. Electronically Signed   By: Darliss Cheney M.D.   On: 04/30/2023 18:45    Final Assessment and Plan:   Hypoglycemia in setting of reactive hypoglycemia: Seems to have slight hypoglycemia that resolves with PO intake. No evidence of infection on routine labs. Not on hypoglycemia inducing medications. Continue eating small meals throughout the day and carry snacks with you in case of recurrent episode.   Discharge and follow up with PCP. Return precautions provided.    Clinical Impression:  1. Hypoglycemia              Final Clinical Impression(s) / ED Diagnoses Final diagnoses:  Hypoglycemia    Rx / DC Orders ED Discharge Orders     None         Alfredo Martinez, MD 04/30/23 1924     Glyn Ade, MD 04/30/23 2256

## 2023-04-30 NOTE — ED Triage Notes (Signed)
 Reports CBG 61  this morning , felt lightheaded , called her PCP , sent her here for evaluation .  Pt had juice prior to arrival . Lightheadedness resolved prior to arrival .  Had same issues with glycemia . No Hx diabetes .

## 2023-04-30 NOTE — Telephone Encounter (Signed)
 Chief Complaint: Low blood sugar  Symptoms: Occasional lightheaded (thinks its related to pulmonary rehab) Frequency: Intermittent past 2 days Pertinent Negatives: Patient denies symptoms  Disposition: [x] ED   Additional Notes: The day before yesterday pt states she woke up to her blood sugar at 67. Pt states yesterday her blood sugar was normal at 87. Pt states today her blood sugar was in the 60s. Pt states she ate breakfast and then her blood sugar was 87. Pt states her blood sugar is currently 61 about 2 hours after eating. This RN advised pt to go to ED and have another adult drive. Pt is agreeable to plan.   Copied from CRM [id number]. Topic: Clinical - Red Word Triage >> Apr 30, 2023  2:54 PM Yolanda T wrote: Red Word that prompted transfer to Nurse Triage: patient said she is having blood sugar levels that are going up and down. At the present time she is at 71. Reason for Disposition  [1] Low blood glucose (70 mg/dl [0.4 mmol/l] or below) persists > 30 minutes AND [2] using low blood sugar Care Advice  Answer Assessment - Initial Assessment Questions SYMPTOMS: "What symptoms are you concerned about?"     Denies BLOOD GLUCOSE: "What is your blood glucose level?"      61 OTHER SYMPTOMS: "Do you have any symptoms?" (e.g., fever, frequent urination, difficulty breathing, vomiting)     Denies LOW BLOOD GLUCOSE TREATMENT: "What have you done so far to treat the low blood glucose level?"     Eaten food FOOD: "When did you last eat or drink?"       Two hours ago  Protocols used: Diabetes - Low Blood Sugar-A-AH

## 2023-05-01 ENCOUNTER — Telehealth: Payer: Self-pay

## 2023-05-01 NOTE — Transitions of Care (Post Inpatient/ED Visit) (Signed)
   05/01/2023  Name: Brandy Medina MRN: [id number] DOB: 12-Jun-1950  Today's TOC FU Call Status: Today's TOC FU Call Status:: Successful TOC FU Call Completed TOC FU Call Complete Date: 05/01/23 Patient's Name and Date of Birth confirmed.  Transition Care Management Follow-up Telephone Call Date of Discharge: 04/30/23 Discharge Facility: [facility] Type of Discharge: Inpatient Admission Primary Inpatient Discharge Diagnosis:: Hypoglycemia How have you been since you were released from the hospital?: Better Any questions or concerns?: No  Items Reviewed: Did you receive and understand the discharge instructions provided?: Yes Medications obtained,verified, and reconciled?: Yes (Medications Reviewed) Any new allergies since your discharge?: No Dietary orders reviewed?: No Do you have support at home?: Yes People in Home: spouse  Medications Reviewed Today: Medications Reviewed Today   Medications were not reviewed in this encounter     Home Care and Equipment/Supplies: Were Home Health Services Ordered?: No Any new equipment or medical supplies ordered?: No  Functional Questionnaire: Do you need assistance with bathing/showering or dressing?: No Do you need assistance with meal preparation?: No Do you need assistance with eating?: No Do you have difficulty maintaining continence: No Do you need assistance with getting out of bed/getting out of a chair/moving?: No Do you have difficulty managing or taking your medications?: No  Follow up appointments reviewed: PCP Follow-up appointment confirmed?: Yes Date of PCP follow-up appointment?: 05/06/23 [facility] Follow-up appointment confirmed?: No Do you need transportation to your follow-up appointment?: No Do you understand care options if your condition(s) worsen?: Yes-patient verbalized understanding    SIGNATURE Lisabeth Devoid, CMA

## 2023-05-05 ENCOUNTER — Encounter: Payer: Self-pay | Admitting: Internal Medicine

## 2023-05-05 ENCOUNTER — Ambulatory Visit: Payer: Self-pay | Admitting: Internal Medicine

## 2023-05-05 ENCOUNTER — Encounter (HOSPITAL_COMMUNITY)
Admission: RE | Admit: 2023-05-05 | Discharge: 2023-05-05 | Disposition: A | Discharge status: Home / Self Care | Payer: Medicare PPO | Source: Ambulatory Visit | Attending: Pulmonary Disease | Admitting: Pulmonary Disease

## 2023-05-05 VITALS — BP 124/80 | HR 76 | Temp 98.3°F | Ht 64.0 in | Wt 158.6 lb

## 2023-05-05 DIAGNOSIS — L659 Nonscarring hair loss, unspecified: Secondary | ICD-10-CM | POA: Diagnosis not present

## 2023-05-05 DIAGNOSIS — L819 Disorder of pigmentation, unspecified: Secondary | ICD-10-CM

## 2023-05-05 DIAGNOSIS — J849 Interstitial pulmonary disease, unspecified: Secondary | ICD-10-CM | POA: Diagnosis not present

## 2023-05-05 DIAGNOSIS — E162 Hypoglycemia, unspecified: Secondary | ICD-10-CM

## 2023-05-05 DIAGNOSIS — E1136 Type 2 diabetes mellitus with diabetic cataract: Secondary | ICD-10-CM

## 2023-05-05 DIAGNOSIS — N289 Disorder of kidney and ureter, unspecified: Secondary | ICD-10-CM | POA: Diagnosis not present

## 2023-05-05 LAB — GLUCOSE, CAPILLARY
Glucose-Capillary: 111 mg/dL — ABNORMAL HIGH (ref 70–99)
Glucose-Capillary: 63 mg/dL — ABNORMAL LOW (ref 70–99)
Glucose-Capillary: 104 mg/dL — ABNORMAL HIGH (ref 70–99)
Glucose-Capillary: 72 mg/dL (ref 70–99)
Glucose-Capillary: 72 mg/dL (ref 70–99)

## 2023-05-05 NOTE — Progress Notes (Signed)
 I,Rorik Vespa T Deloria Lair, CMA,acting as a Neurosurgeon for Gwynneth Aliment, MD.,have documented all relevant documentation on the behalf of Gwynneth Aliment, MD,as directed by  Gwynneth Aliment, MD while in the presence of Gwynneth Aliment, MD.  Subjective:  Patient ID: Brandy Medina , female    DOB: 1950/06/01 , 73 y.o.   MRN: [id number]  Chief Complaint  Patient presents with   Follow-up    Patient presents today for ED follow up. She visited [facility] on 04/30/2023 for hypoglycemia. Today she reports feeling okay. She has been keeping track of her blood sugar since visit to the ED. This morning her BS read: 54. After fasting from the night. After that she then ate oatmeal & blueberries. She admits not suffering from any low blood sugar side effects like shakiness, dizziness, sweating. She sometimes does feel nervous stomach. While here today she would like to referral to derma    HPI  HPI   Past Medical History:  Diagnosis Date   Allergic rhinitis    Diabetes (HCC)    HTN (hypertension)    Migraines    PONV (postoperative nausea and vomiting)    Pulmonary fibrosis (HCC)      Family History  Problem Relation Age of Onset   Renal Disease Brother    Stroke Brother    Bronchitis Mother    Heart disease Mother    Heart disease Father    Heart attack Father    Hypertension Sister    Diabetes Sister    Heart attack Brother    Hypertension Sister    Diabetes Sister      Current Outpatient Medications:    acetaminophen (TYLENOL) 500 MG tablet, Take 500-1,000 mg by mouth every 6 (six) hours as needed for moderate pain or headache., Disp: , Rfl:    amLODipine (NORVASC) 5 MG tablet, Take 1 tablet (5 mg total) by mouth daily. May take an additional 2.5 mg tablet if systolic blood pressure 160 if needed. (Patient taking differently: Take 2.5 mg by mouth daily.), Disp: 135 tablet, Rfl: 3   butalbital-acetaminophen-caffeine (FIORICET) 50-325-40 MG tablet, Take 1 tablet by mouth 2 (two)  times daily as needed for migraine., Disp: , Rfl:    chlorpheniramine (CHLOR-TRIMETON) 4 MG tablet, Take 4 mg by mouth every 4 (four) hours as needed for allergies., Disp: , Rfl:    Cholecalciferol (VITAMIN D) 50 MCG (2000 UT) tablet, Take 1,000 Units by mouth daily. Tues and Thurs, Disp: , Rfl:    dextromethorphan (DELSYM) 30 MG/5ML liquid, Take 30 mg by mouth 2 (two) times daily as needed for cough., Disp: , Rfl:    Homeopathic Products (ARNICARE PAIN RELIEF EX), Apply 1 application. topically daily as needed (pain)., Disp: , Rfl:    Respiratory Therapy Supplies (FLUTTER) DEVI, Use as directed., Disp: 1 each, Rfl: 0   rosuvastatin (CRESTOR) 5 MG tablet, Take 1 tablet (5 mg total) by mouth every Tuesday., Disp: 12 tablet, Rfl: 3   valsartan (DIOVAN) 320 MG tablet, Take 1 tablet (320 mg total) by mouth daily., Disp: 90 tablet, Rfl: 2   Wheat Dextrin (BENEFIBER PO), Take 1 Scoop by mouth daily., Disp: , Rfl:    azithromycin (ZITHROMAX) 250 MG tablet, Take 1 tablet (250 mg total) by mouth daily. Take first 2 tablets together, then 1 every day until finished. (Patient not taking: Reported on 05/05/2023), Disp: 6 tablet, Rfl: 0   empagliflozin (JARDIANCE) 10 MG TABS tablet, Take 1 tablet (10 mg total)  by mouth daily before breakfast. (Patient not taking: Reported on 05/05/2023), Disp: , Rfl:    epoetin alfa (EPOGEN) 10000 UNIT/ML injection, as directed Injection once every 3 weeks for 3 months, Disp: , Rfl:    Allergies  Allergen Reactions   Avelox [Moxifloxacin Hcl In Nacl] Other (See Comments)    confusion   Avelox [Moxifloxacin] Other (See Comments)   Azathioprine Other (See Comments)   Codeine Nausea Only and Other (See Comments)   Erythromycin     GI upset   Fexofenadine Other (See Comments)   Levocetirizine Other (See Comments)   Lisinopril     Headache   Sulfa Antibiotics Other (See Comments)   Sulfonamide Derivatives     headache   Zyrtec [Cetirizine] Other (See Comments)     Review  of Systems  Constitutional: Negative.   Respiratory: Negative.    Cardiovascular: Negative.   Neurological: Negative.   Psychiatric/Behavioral: Negative.       Today's Vitals   05/05/23 1535  BP: 124/80  Pulse: 76  Temp: 98.3 F (36.8 C)  SpO2: 98%  Weight: 158 lb 9.6 oz (71.9 kg)  Height: 5\' 4"  (1.626 m)   Body mass index is 27.22 kg/m.  Wt Readings from Last 3 Encounters:  05/05/23 158 lb 9.6 oz (71.9 kg)  04/29/23 157 lb 12.8 oz (71.6 kg)  04/28/23 156 lb 12 oz (71.1 kg)     Objective:  Physical Exam      Assessment And Plan:  Hypoglycemia     Return if symptoms worsen or fail to improve.  Patient was given opportunity to ask questions. Patient verbalized understanding of the plan and was able to repeat key elements of the plan. All questions were answered to their satisfaction.  Gwynneth Aliment, MD  I, Gwynneth Aliment, MD, have reviewed all documentation for this visit. The documentation on 05/05/23 for the exam, diagnosis, procedures, and orders are all accurate and complete.   IF YOU HAVE BEEN REFERRED TO A SPECIALIST, IT MAY TAKE 1-2 WEEKS TO SCHEDULE/PROCESS THE REFERRAL. IF YOU HAVE NOT HEARD FROM US/SPECIALIST IN TWO WEEKS, PLEASE GIVE [place] A CALL AT [phone].   THE PATIENT IS ENCOURAGED TO PRACTICE SOCIAL DISTANCING DUE TO THE COVID-19 PANDEMIC.

## 2023-05-05 NOTE — Progress Notes (Signed)
 Subjective    Patient ID: Brandy Medina, female    DOB: [birth year]  Age: 73 y.o. MRN: [id number]  CC:  Chief Complaint  Patient presents with   Follow-up    Patient presents today for ED follow up. She visited [facility] on 04/30/2023 for hypoglycemia. Today she reports feeling okay. She has been keeping track of her blood sugar since visit to the ED. This morning her BS read: 54. After fasting from the night. After that she then ate oatmeal & blueberries. She admits not suffering from any low blood sugar side effects like shakiness, dizziness, sweating. She sometimes does feel nervous stomach. While here today she would like to referral to derma     Discussed the use of AI scribe software for clinical note transcription with the patient, who gave verbal consent to proceed.  History of Present Illness Brandy Medina is a 73 year old female with diabetes and chronic kidney disease who presents with hypoglycemia.  She presents for follow-up after an emergency room visit due to hypoglycemia. Her blood sugar dropped to 61 at home, prompting the ER visit. In the ER, she was treated with orange juice, honey, and peanut butter crackers, which helped raise her blood sugar levels. Her blood sugar was 50 or 54 in the morning before eating breakfast, which included oatmeal, blueberries, and string cheese. She typically eats dinner before 6 PM and does not eat again until the next morning, resulting in a 12-hour fasting period. She has been following this routine for about a year and a half. Her usual blood sugar levels are in the 80s, with occasional readings of 97 or 103.  She has a history of diabetes with diabetic chronic kidney disease, previously managed with metformin, which was discontinued due to improved blood sugar levels. She has a history of hypoglycemia from years ago, previously managed by Dr. Ardyce Bee. She has not seen an endocrinologist for this condition.  She has a  history of anemia, managed by Dr. Lee Public, with hemoglobin levels around 9.7 to 10. She receives epoetin injections, which have not significantly changed her hemoglobin levels.  She has a history of Sjogren's disease, which leads her to drink a lot of water, causing frequent nighttime urination.  She experiences fatigue and lightheadedness, which she associates with her blood sugar levels. No recent episodes of waking up sweaty, but she has experienced it in the past without significant blood sugar changes.    Outpatient Encounter Medications as of 05/05/2023  Medication Sig   acetaminophen (TYLENOL) 500 MG tablet Take 500-1,000 mg by mouth every 6 (six) hours as needed for moderate pain or headache.   amLODipine (NORVASC) 5 MG tablet Take 1 tablet (5 mg total) by mouth daily. May take an additional 2.5 mg tablet if systolic blood pressure 160 if needed. (Patient taking differently: Take 2.5 mg by mouth daily.)   butalbital-acetaminophen-caffeine (FIORICET) 50-325-40 MG tablet Take 1 tablet by mouth 2 (two) times daily as needed for migraine.   chlorpheniramine (CHLOR-TRIMETON) 4 MG tablet Take 4 mg by mouth every 4 (four) hours as needed for allergies.   Cholecalciferol (VITAMIN D) 50 MCG (2000 UT) tablet Take 1,000 Units by mouth daily. Tues and Thurs   dextromethorphan (DELSYM) 30 MG/5ML liquid Take 30 mg by mouth 2 (two) times daily as needed for cough.   Homeopathic Products (ARNICARE PAIN RELIEF EX) Apply 1 application. topically daily as needed (pain).   Respiratory Therapy Supplies (FLUTTER) DEVI Use  as directed.   rosuvastatin (CRESTOR) 5 MG tablet Take 1 tablet (5 mg total) by mouth every Tuesday.   valsartan (DIOVAN) 320 MG tablet Take 1 tablet (320 mg total) by mouth daily.   Wheat Dextrin (BENEFIBER PO) Take 1 Scoop by mouth daily.   azithromycin (ZITHROMAX) 250 MG tablet Take 1 tablet (250 mg total) by mouth daily. Take first 2 tablets together, then 1 every day until finished.  (Patient not taking: Reported on 05/05/2023)   epoetin alfa (EPOGEN) 10000 UNIT/ML injection as directed Injection once every 3 weeks for 3 months   [DISCONTINUED] empagliflozin (JARDIANCE) 10 MG TABS tablet Take 1 tablet (10 mg total) by mouth daily before breakfast. (Patient not taking: Reported on 05/05/2023)   No facility-administered encounter medications on file as of 05/05/2023.    Past Medical History:  Diagnosis Date   Allergic rhinitis    Diabetes (HCC)    HTN (hypertension)    Migraines    PONV (postoperative nausea and vomiting)    Pulmonary fibrosis (HCC)     Past Surgical History:  Procedure Laterality Date   ABDOMINAL HYSTERECTOMY  1989   partial   BIOPSY  06/01/2020   Procedure: BIOPSY;  Surgeon: Genell Ken, MD;  Location: [facility];  Service: Gastroenterology;;  EGD and COLON   BIOPSY  06/04/2021   Procedure: BIOPSY;  Surgeon: Genell Ken, MD;  Location: [facility];  Service: Gastroenterology;;   BREAST SURGERY     COLONOSCOPY WITH PROPOFOL N/A 06/01/2020   Procedure: COLONOSCOPY WITH PROPOFOL;  Surgeon: Genell Ken, MD;  Location: [facility];  Service: Gastroenterology;  Laterality: N/A;   ESOPHAGOGASTRODUODENOSCOPY N/A 06/04/2021   Procedure: ESOPHAGOGASTRODUODENOSCOPY (EGD);  Surgeon: Genell Ken, MD;  Location: [facility];  Service: Gastroenterology;  Laterality: N/A;   ESOPHAGOGASTRODUODENOSCOPY (EGD) WITH PROPOFOL N/A 06/01/2020   Procedure: ESOPHAGOGASTRODUODENOSCOPY (EGD) WITH PROPOFOL;  Surgeon: Genell Ken, MD;  Location: [facility];  Service: Gastroenterology;  Laterality: N/A;   POLYPECTOMY  06/01/2020   Procedure: POLYPECTOMY;  Surgeon: Genell Ken, MD;  Location: [facility];  Service: Gastroenterology;;   SALPINGOOPHORECTOMY Left     Family History  Problem Relation Age of Onset   Renal Disease Brother    Stroke Brother    Bronchitis Mother    Heart disease Mother    Heart disease Father    Heart attack Father    Hypertension Sister     Diabetes Sister    Heart attack Brother    Hypertension Sister    Diabetes Sister     Social History   Socioeconomic History   Marital status: Married    Spouse name: Not on file   Number of children: Not on file   Years of education: Not on file   Highest education level: Not on file  Occupational History   Not on file  Tobacco Use   Smoking status: Former    Current packs/day: 0.00    Average packs/day: 0.5 packs/day for 18.0 years (9.0 ttl pk-yrs)    Types: Cigarettes    Start date: 07/06/1975    Quit date: 07/05/1993    Years since quitting: 29.8   Smokeless tobacco: Never  Substance and Sexual Activity   Alcohol use: Yes    Comment: Occasional Wine or Martini   Drug use: Never   Sexual activity: Not Currently  Other Topics Concern   Not on file  Social History Narrative   Are you right handed or left handed? Right ended   Are you currently employed ?  No   What is your current occupation? Retired   Do you live at home alone? No    Who lives with you?  Lives with husband    What type of home do you live in: 1 story or 2 story? Lives in a two story home.        Social Drivers of Corporate investment banker Strain: Low Risk  (04/24/2023)   Overall Financial Resource Strain (CARDIA)    Difficulty of Paying Living Expenses: Not hard at all  Food Insecurity: No Food Insecurity (04/24/2023)   Hunger Vital Sign    Worried About Running Out of Food in the Last Year: Never true    Ran Out of Food in the Last Year: Never true  Transportation Needs: No Transportation Needs (04/24/2023)   PRAPARE - Administrator, Civil Service (Medical): No    Lack of Transportation (Non-Medical): No  Physical Activity: Insufficiently Active (04/24/2023)   Exercise Vital Sign    Days of Exercise per Week: 2 days    Minutes of Exercise per Session: 30 min  Stress: No Stress Concern Present (04/24/2023)   [employer] of Occupational Health - Occupational Stress  Questionnaire    Feeling of Stress : Not at all  Social Connections: Socially Integrated (04/24/2023)   Social Connection and Isolation Panel [NHANES]    Frequency of Communication with Friends and Family: More than three times a week    Frequency of Social Gatherings with Friends and Family: Three times a week    Attends Religious Services: More than 4 times per year    Active Member of Clubs or Organizations: Yes    Attends Banker Meetings: 1 to 4 times per year    Marital Status: Married  Catering manager Violence: Not At Risk (04/24/2023)   Humiliation, Afraid, Rape, and Kick questionnaire    Fear of Current or Ex-Partner: No    Emotionally Abused: No    Physically Abused: No    Sexually Abused: No    Review of Systems  Constitutional: Negative.   Respiratory: Negative.    Cardiovascular: Negative.   Gastrointestinal: Negative.   Neurological: Negative.   Psychiatric/Behavioral: Negative.     Objective    BP 124/80   Pulse 76   Temp 98.3 F (36.8 C)   Ht 5\' 4"  (1.626 m)   Wt 158 lb 9.6 oz (71.9 kg)   SpO2 98%   BMI 27.22 kg/m   Physical Exam Vitals and nursing note reviewed.  Constitutional:      Appearance: Normal appearance.  HENT:     Head: Normocephalic and atraumatic.  Eyes:     Extraocular Movements: Extraocular movements intact.  Cardiovascular:     Rate and Rhythm: Normal rate and regular rhythm.     Heart sounds: Normal heart sounds.  Pulmonary:     Effort: Pulmonary effort is normal.     Breath sounds: Normal breath sounds.  Musculoskeletal:     Cervical back: Normal range of motion.  Skin:    General: Skin is warm.  Neurological:     General: No focal deficit present.     Mental Status: She is alert.  Psychiatric:        Mood and Affect: Mood normal.        Behavior: Behavior normal.          Assessment & Plan:   Problem List Items Addressed This Visit       Endocrine  Hypoglycemia - Primary   ER records reviewed  in detail. I will check labs below and refer her to Endocrinology for further evaluation.       Relevant Orders   Insulin and C-Peptide   Ambulatory referral to Endocrinology   DM cataract (HCC)   Chronic, not currently followed by Endo. However, she is followed by ophthalmology. She did not tolerate SGLT2i.      Relevant Orders   Ambulatory referral to Endocrinology   Hemoglobin A1c     Musculoskeletal and Integument   Hyperpigmentation   Picture included. Will refer her to [facility].       Relevant Orders   Ambulatory referral to Dermatology     Genitourinary   Renal insufficiency   Chronic, she is encouraged to stay well hydrated, avoid NSAIDs and keep BP controlled to prevent progression of CKD.         Relevant Orders   Protein electrophoresis, serum     Other   Hair thinning   I will check iron levels. I will also refer her to [facility] as requested. May benefit from collagen supplementation.       Relevant Orders   Iron, TIBC and Ferritin Panel   ANA, IFA (with reflex)    Return if symptoms worsen or fail to improve.   Smiley Dung, MD

## 2023-05-05 NOTE — Progress Notes (Signed)
 Daily Session Note  Patient Details  Name: Brandy Medina MRN: [id number] Date of Birth: 1950-02-02 Referring Provider:   Doristine Devoid Pulmonary Rehab Walk Test from 02/20/2023 in [facility]  Referring Provider Mannam       Encounter Date: 05/05/2023  Check In:  Session Check In - 05/05/23 4098       Check-In   Supervising physician immediately available to respond to emergencies [facility] MD immediately available    Physician(s) Rise Paganini, NP    Location [facility]    Staff Present Raford Pitcher, MS, ACSM-CEP, Exercise Physiologist;Casey Synthia Innocent, RN, BSN;Randi Reeve BS, ACSM-CEP, Exercise Physiologist    Virtual Visit No    Medication changes reported     No    Fall or balance concerns reported    No    Tobacco Cessation No Change    Warm-up and Cool-down Performed as group-led instruction    Resistance Training Performed Yes    VAD Patient? No    PAD/SET Patient? No      Pain Assessment   Currently in Pain? No/denies    Multiple Pain Sites No             Capillary Blood Glucose: Results for orders placed or performed during the hospital encounter of 04/30/23 (from the past 24 hours)  Glucose, capillary     Status: Abnormal   Collection Time: 05/05/23  8:15 AM  Result Value Ref Range   Glucose-Capillary 111 (H) 70 - 99 mg/dL  Glucose, capillary     Status: Abnormal   Collection Time: 05/05/23  9:13 AM  Result Value Ref Range   Glucose-Capillary 63 (L) 70 - 99 mg/dL  Glucose, capillary     Status: None   Collection Time: 05/05/23  9:41 AM  Result Value Ref Range   Glucose-Capillary 72 70 - 99 mg/dL  Glucose, capillary     Status: None   Collection Time: 05/05/23  9:59 AM  Result Value Ref Range   Glucose-Capillary 72 70 - 99 mg/dL  Glucose, capillary     Status: Abnormal   Collection Time: 05/05/23 10:27 AM  Result Value Ref Range   Glucose-Capillary 104 (H) 70 - 99  mg/dL      Social History   Tobacco Use  Smoking Status Former   Current packs/day: 0.00   Average packs/day: 0.5 packs/day for 18.0 years (9.0 ttl pk-yrs)   Types: Cigarettes   Start date: 07/06/1975   Quit date: 07/05/1993   Years since quitting: 29.8  Smokeless Tobacco Never    Goals Met:  Independence with exercise equipment Exercise tolerated well No report of concerns or symptoms today Strength training completed today  Goals Unmet:  Blood sugar control  Comments:  Renee Rival Cullop's blood sugar pre-exercise was 111. Pt stated she ate oatmeal, blueberries, flax, and string cheese for breakfast, also orange juice with 1 packet of raw sugar. She ate around 7am. Arrived around 8am and pre-exercise BS 111 exercised for 30 minutes. Recheck blood sugar post exercise was 63. 4oz OJ, graham crackers and peanut butter provided. BS recheck was 72. Pt given glucose gel, recheck 10-83min was 72. Ginger ale provided.    Education with RN and RD advised to eat more frequent meals with carbs every 3-4 hours. Pt advised not to fast between 6pm-6am, stressed to eat something before bed. BS this am was 50. Pt stopped jardiance last Monday.  Education also provided about the rule of 15. Handout reviewed and provided to patient. All questioned answered, will follow up on Thursday.         Dr. Mechele Collin is Medical Director for Pulmonary Rehab at [facility]

## 2023-05-05 NOTE — Patient Instructions (Signed)
 Hypoglycemia Hypoglycemia is when the amount of sugar, or glucose, in your blood is too low. Low blood sugar can happen if you have diabetes or if you don't have diabetes. It may be an emergency. What are the causes? Low blood sugar happens most often in people who have diabetes. It may be caused by: Diabetes medicine. Not eating enough, or not eating often enough. Being more active than normal. If you don't have diabetes, you may still get low blood sugar if: There's a tumor in your pancreas. A tumor is a growth of cells that isn't normal. You don't eat enough, or you fast. Fasting is when you don't eat for long periods at a time. You have a bad infection or illness. You have problems after weight loss surgery. You have kidney or liver problems. You take certain medicines. What increases the risk? You're more likely to have low blood sugar if: You have diabetes and take medicine for it. You drink a lot of alcohol. You get sick. What are the signs or symptoms? Mild Hunger or feeling like you may vomit. Sweating and feeling cold to the touch. Feeling dizzy or light-headed. Being sleepy or having trouble sleeping. A headache. Blurry vision. Mood changes. These include feeling worried, nervous, or easily annoyed. Moderate Feeling confused. Changes in the way you act. Weakness. An uneven heartbeat. Very bad Having very low blood sugar is an emergency. It can cause: Fainting. Seizures. A coma. Death. How is this diagnosed?  Low blood sugar can be found with a blood test. This test tells you how much sugar is in your blood. It's done while you're having symptoms. Your health care provider may also do an exam and look at your medical history. How is this treated? Treating low blood sugar If you have low blood sugar, eat or drink something with sugar in it right away. The food or drink should have 15 grams of a fast-acting carbohydrate (carb). Options include: 4 oz (120 mL) of  fruit juice. 4 oz (120 mL) of soda (not diet soda). A few pieces of hard candy. Check food labels to see how many pieces to eat. 1 Tbsp (15 mL) of sugar or honey. 4 glucose tablets. 1 tube of glucose gel. Treating low blood sugar if you have diabetes Talk with your provider about how much carb you should take. If you're alert and can swallow safely, you may follow the 15:15 rule: Take 15 grams of a fast-acting carb. Check your blood sugar 15 minutes after you take the carb. If your blood sugar is still at or below 70 mg/dL (3.9 mmol/L), take 15 grams of a carb again. If your blood sugar doesn't go above 70 mg/dL (3.9 mmol/L) after 3 tries, get help right away. After your blood sugar goes back to normal, eat a meal or a snack within 1 hour. Always keep 15 grams of a fast-acting carb with you. This could be: 4 glucose tablets. A few pieces of hard candy. 1 Tbsp (15 mL) of honey or sugar. 1 tube of glucose gel. Treating very low blood sugar If your blood sugar is less than 54 mg/dL (3 mmol/L), it's an emergency. Get help right away. If you can't eat or drink, you will need to be given glucagon. A family member or friend should learn how to check your blood sugar and give you glucagon. Ask your provider if you should keep a glucagon kit at home. You may also need to be treated in a hospital. Follow  these instructions at home: If you have diabetes: Always keep a fast-acting carb (15 grams) with you. Follow your diabetes care plan. Make sure you: Know the symptoms of low blood sugar. Check your blood sugar as often as told. Always check it before and after you exercise. Always check your blood sugar before you drive. Take your medicines as told. Eat on time. Do not skip meals. Share your diabetes care plan with: Your work or school. The people you live with. Wear an alert bracelet or carry a card that says you have diabetes. General instructions If you drink alcohol: Limit how much you  have to: 0-1 drink a day if you're female. 0-2 drinks a day if you're female. Know how much alcohol is in your drink. In the [place] one drink is one 12 oz bottle of beer (355 mL), one 5 oz glass of wine (148 mL), or one 1 oz glass of hard liquor (44 mL). Be sure to eat food when you drink alcohol. Be sure to check your blood sugar after you drink. Alcohol may lead to low blood sugar later. Where to find more information American Diabetes Association (ADA): diabetes.org Contact a health care provider if: You have low blood sugar often. You have diabetes and are having trouble keeping your blood sugar in the right range. Get help right away if: You can't get your blood sugar above 70 mg/dL (3.9 mmol/L) after 3 tries. Your blood sugar is below 54 mg/dL (3 mmol/L). You have a seizure. You faint. These symptoms may be an emergency. Call 911 right away. Do not wait to see if the symptoms will go away. Do not drive yourself to the hospital. This information is not intended to replace advice given to you by your health care provider. Make sure you discuss any questions you have with your health care provider. Document Revised: 10/16/2022 Document Reviewed: 04/02/2022 Elsevier Patient Education  2024 [employer].

## 2023-05-05 NOTE — Progress Notes (Signed)
 Incomplete Session Note  Patient Details  Name: Brandy Medina MRN: [id number] Date of Birth: 03/03/1950 Referring Provider:   Doristine Devoid Pulmonary Rehab Walk Test from 02/20/2023 in [facility]  Referring Provider Shondra Capps H [name] blood sugar pre-exercise was 111. Pt stated she ate oatmeal, blueberries, flax, and string cheese for breakfast, also orange juice with 1 packet of raw sugar. She ate around 7am. Arrived around 8am and pre-exercise BS 111 exercised for 30 minutes. Recheck blood sugar post exercise was 63. 4oz OJ, graham crackers and peanut butter provided. BS recheck was 72. Pt given glucose gel, recheck 10-8min was 72. Ginger ale provided.   Education with RN and RD advised to eat more frequent meals with carbs every 3-4 hours. Pt advised not to fast between 6pm-6am, stressed to eat something before bed. BS this am was 50. Pt stopped jardiance last Monday.   Education also provided about the rule of 15. Handout reviewed and provided to patient. All questioned answered, will follow up on Thursday.

## 2023-05-07 ENCOUNTER — Encounter (HOSPITAL_COMMUNITY)
Admission: RE | Admit: 2023-05-07 | Discharge: 2023-05-07 | Disposition: A | Discharge status: Home / Self Care | Payer: Medicare PPO | Source: Ambulatory Visit | Attending: Pulmonary Disease | Admitting: Pulmonary Disease

## 2023-05-07 DIAGNOSIS — J849 Interstitial pulmonary disease, unspecified: Secondary | ICD-10-CM

## 2023-05-07 LAB — GLUCOSE, CAPILLARY
Glucose-Capillary: 110 mg/dL — ABNORMAL HIGH (ref 70–99)
Glucose-Capillary: 67 mg/dL — ABNORMAL LOW (ref 70–99)

## 2023-05-07 NOTE — Progress Notes (Signed)
 Home Exercise Prescription I have reviewed a Home Exercise Prescription with Brandy Medina. Brandy Medina is only doing resistance exercises at home but not aerobic exercise. I encouraged her to walk either at the [facility] or outside for 3 days/wk for 30 min/day. She agreed with my recommendations. She seems motivated to exercise and improve her functional capacity. Will check on home exercise later. The patient stated that their goals were to feel better. We reviewed exercise guidelines, target heart rate during exercise, RPE Scale, weather conditions, endpoints for exercise, warmup and cool down. The patient is encouraged to come to me with any questions. I will continue to follow up with the patient to assist them with progression and safety. Spent 15 min with patient discussing home exercise plan and goals  Joya San, MS, ACSM-CEP 05/07/2023 3:43 PM

## 2023-05-07 NOTE — Progress Notes (Signed)
 Daily Session Note  Patient Details  Name: Brandy Medina MRN: [id number] Date of Birth: 1950-05-21 Referring Provider:   Doristine Devoid Pulmonary Rehab Walk Test from 02/20/2023 in [facility]  Referring Provider Mannam       Encounter Date: 05/07/2023  Check In:  Session Check In - 05/07/23 0817       Check-In   Supervising physician immediately available to respond to emergencies [facility] MD immediately available    Physician(s) Robin Searing, NP    Location [facility]    Staff Present Raford Pitcher, MS, ACSM-CEP, Exercise Physiologist;Casey Synthia Innocent, RN, BSN;Randi Reeve BS, ACSM-CEP, Exercise Physiologist    Virtual Visit No    Medication changes reported     No    Fall or balance concerns reported    No    Tobacco Cessation No Change    Warm-up and Cool-down Performed as group-led instruction    Resistance Training Performed Yes    VAD Patient? No    PAD/SET Patient? No      Pain Assessment   Currently in Pain? No/denies    Multiple Pain Sites No             Capillary Blood Glucose: Results for orders placed or performed during the hospital encounter of 05/05/23 (from the past 24 hours)  Glucose, capillary     Status: Abnormal   Collection Time: 05/07/23  9:35 AM  Result Value Ref Range   Glucose-Capillary 67 (L) 70 - 99 mg/dL  Glucose, capillary     Status: Abnormal   Collection Time: 05/07/23  9:54 AM  Result Value Ref Range   Glucose-Capillary 110 (H) 70 - 99 mg/dL      Social History   Tobacco Use  Smoking Status Former   Current packs/day: 0.00   Average packs/day: 0.5 packs/day for 18.0 years (9.0 ttl pk-yrs)   Types: Cigarettes   Start date: 07/06/1975   Quit date: 07/05/1993   Years since quitting: 29.8  Smokeless Tobacco Never    Goals Met:  Independence with exercise equipment Exercise tolerated well No report of concerns or symptoms today Strength training  completed today  Goals Unmet:  Blood sugar dropped to 64 post exercise, fruit gummies given, recheck 110  Comments: Service time is from 0807 to 0955    Dr. Mechele Collin is Medical Director for Pulmonary Rehab at [facility].

## 2023-05-07 NOTE — Progress Notes (Signed)
 Incomplete Session Note  Patient Details  Name: Brandy Medina MRN: [id number] Date of Birth: 02-Jun-1950 Referring Provider:   Doristine Devoid Pulmonary Rehab Walk Test from 02/20/2023 in [facility]  Referring Provider [name] H Knittle's blood sugar dropped post exercise today to 64. Fruit gummies eaten and recheck was 110. Dietician provided education about having a snack right before exercise. Re-educated on eating small snacks every 3-4 hours, when BS 70 or lower to treat with a simple carb.  Pt verbalized understanding.

## 2023-05-10 ENCOUNTER — Encounter: Payer: Self-pay | Admitting: Internal Medicine

## 2023-05-10 DIAGNOSIS — E1136 Type 2 diabetes mellitus with diabetic cataract: Secondary | ICD-10-CM | POA: Insufficient documentation

## 2023-05-10 DIAGNOSIS — N289 Disorder of kidney and ureter, unspecified: Secondary | ICD-10-CM | POA: Insufficient documentation

## 2023-05-10 DIAGNOSIS — L819 Disorder of pigmentation, unspecified: Secondary | ICD-10-CM | POA: Insufficient documentation

## 2023-05-10 DIAGNOSIS — L659 Nonscarring hair loss, unspecified: Secondary | ICD-10-CM | POA: Insufficient documentation

## 2023-05-10 NOTE — Assessment & Plan Note (Signed)
 ER records reviewed in detail. I will check labs below and refer her to Endocrinology for further evaluation.

## 2023-05-10 NOTE — Assessment & Plan Note (Signed)
 I will check iron levels. I will also refer her to [facility] as requested. May benefit from collagen supplementation.

## 2023-05-10 NOTE — Assessment & Plan Note (Signed)
 Chronic, not currently followed by Endo. However, she is followed by ophthalmology. She did not tolerate SGLT2i.

## 2023-05-10 NOTE — Assessment & Plan Note (Signed)
 Chronic, she is encouraged to stay well hydrated, avoid NSAIDs and keep BP controlled to prevent progression of CKD.

## 2023-05-10 NOTE — Assessment & Plan Note (Signed)
 Picture included. Will refer her to [facility].

## 2023-05-11 ENCOUNTER — Encounter: Payer: Self-pay | Admitting: Neurology

## 2023-05-11 ENCOUNTER — Encounter: Payer: Self-pay | Admitting: Internal Medicine

## 2023-05-11 ENCOUNTER — Ambulatory Visit: Payer: Self-pay | Admitting: Internal Medicine

## 2023-05-11 LAB — PROTEIN ELECTROPHORESIS, SERUM
A/G Ratio: 0.7 (ref 0.7–1.7)
Albumin ELP: 3.6 g/dL (ref 2.9–4.4)
Alpha 1: 0.3 g/dL (ref 0.0–0.4)
Alpha 2: 1 g/dL (ref 0.4–1.0)
Beta: 1 g/dL (ref 0.7–1.3)
Gamma Globulin: 3.3 g/dL — ABNORMAL HIGH (ref 0.4–1.8)
Globulin, Total: 5.5 g/dL — ABNORMAL HIGH (ref 2.2–3.9)
Total Protein: 9.1 g/dL — ABNORMAL HIGH (ref 6.0–8.5)

## 2023-05-11 LAB — FANA STAINING PATTERNS: Speckled Pattern: 1:1280 {titer} — ABNORMAL HIGH

## 2023-05-11 LAB — HEMOGLOBIN A1C
Est. average glucose Bld gHb Est-mCnc: 140 mg/dL
Hgb A1c MFr Bld: 6.5 % — ABNORMAL HIGH (ref 4.8–5.6)

## 2023-05-11 LAB — IRON,TIBC AND FERRITIN PANEL
Ferritin: 656 ng/mL — ABNORMAL HIGH (ref 15–150)
Iron Saturation: 19 % (ref 15–55)
Iron: 57 ug/dL (ref 27–139)
Total Iron Binding Capacity: 295 ug/dL (ref 250–450)
UIBC: 238 ug/dL (ref 118–369)

## 2023-05-11 LAB — INSULIN AND C-PEPTIDE, SERUM
C-Peptide: 3.5 ng/mL (ref 1.1–4.4)
INSULIN: 14.5 u[IU]/mL (ref 2.6–24.9)

## 2023-05-11 LAB — ANTINUCLEAR ANTIBODIES, IFA: ANA Titer 1: POSITIVE — AB

## 2023-05-12 ENCOUNTER — Encounter (HOSPITAL_COMMUNITY)
Admission: RE | Admit: 2023-05-12 | Discharge: 2023-05-12 | Disposition: A | Discharge status: Home / Self Care | Payer: Medicare PPO | Source: Ambulatory Visit | Attending: Pulmonary Disease | Admitting: Pulmonary Disease

## 2023-05-12 ENCOUNTER — Encounter: Payer: Self-pay | Admitting: Internal Medicine

## 2023-05-12 VITALS — Wt 156.3 lb

## 2023-05-12 DIAGNOSIS — J849 Interstitial pulmonary disease, unspecified: Secondary | ICD-10-CM | POA: Diagnosis not present

## 2023-05-12 LAB — GLUCOSE, CAPILLARY
Glucose-Capillary: 56 mg/dL — ABNORMAL LOW (ref 70–99)
Glucose-Capillary: 108 mg/dL — ABNORMAL HIGH (ref 70–99)

## 2023-05-12 NOTE — Progress Notes (Signed)
 Daily Session Note  Patient Details  Name: Brandy Medina MRN: [id number] Date of Birth: 12/25/1950 Referring Provider:   Doristine Devoid Pulmonary Rehab Walk Test from 02/20/2023 in [facility]  Referring Provider Mannam       Encounter Date: 05/12/2023  Check In:  Session Check In - 05/12/23 0817       Check-In   Supervising physician immediately available to respond to emergencies [facility] MD immediately available    Physician(s) Rise Paganini, NP    Location [facility]    Staff Present Raford Pitcher, MS, ACSM-CEP, Exercise Physiologist;Herberth Deharo Synthia Innocent, RN, BSN;Randi Reeve BS, ACSM-CEP, Exercise Physiologist    Virtual Visit No    Medication changes reported     No    Fall or balance concerns reported    No    Tobacco Cessation No Change    Warm-up and Cool-down Performed as group-led instruction    Resistance Training Performed Yes    VAD Patient? No    PAD/SET Patient? No      Pain Assessment   Currently in Pain? No/denies    Multiple Pain Sites No             Capillary Blood Glucose: Results for orders placed or performed during the hospital encounter of 05/07/23 (from the past 24 hours)  Glucose, capillary     Status: Abnormal   Collection Time: 05/12/23  9:05 AM  Result Value Ref Range   Glucose-Capillary 56 (L) 70 - 99 mg/dL     Exercise Prescription Changes - 05/12/23 0900       Response to Exercise   Blood Pressure (Admit) 102/64    Blood Pressure (Exercise) 126/62    Blood Pressure (Exit) 108/60    Heart Rate (Admit) 88 bpm    Heart Rate (Exercise) 146 bpm    Heart Rate (Exit) 100 bpm    Oxygen Saturation (Admit) 98 %    Oxygen Saturation (Exercise) 93 %    Oxygen Saturation (Exit) 94 %    Rating of Perceived Exertion (Exercise) 14    Perceived Dyspnea (Exercise) 3.5    Duration Continue with 30 min of aerobic exercise without signs/symptoms of physical  distress.    Intensity THRR unchanged      Progression   Progression Continue to progress workloads to maintain intensity without signs/symptoms of physical distress.      Resistance Training   Training Prescription Yes    Weight blue bands    Reps 10-15    Time 10 Minutes      Treadmill   MPH 2.3    Grade 1    Minutes 15    METs 2.9      Recumbant Elliptical   Level 3    Minutes 15    METs 3.5             Social History   Tobacco Use  Smoking Status Former   Current packs/day: 0.00   Average packs/day: 0.5 packs/day for 18.0 years (9.0 ttl pk-yrs)   Types: Cigarettes   Start date: 07/06/1975   Quit date: 07/05/1993   Years since quitting: 29.8  Smokeless Tobacco Never    Goals Met:  Proper associated with RPD/PD & O2 Sat Independence with exercise equipment Exercise tolerated well No report of concerns or symptoms today Strength training completed today  Goals Unmet:  Not Applicable  Comments: Service time is from 0811 to 0926.  Dr. Genetta Kenning is Medical Director for Pulmonary Rehab at [facility].

## 2023-05-13 ENCOUNTER — Other Ambulatory Visit: Payer: Self-pay

## 2023-05-13 MED ORDER — GLUCOSE BLOOD VI STRP: ORAL_STRIP | 12 refills | Status: DC

## 2023-05-13 NOTE — Progress Notes (Signed)
 Pulmonary Individual Treatment Plan  Patient Details  Name: Brandy Medina MRN: [id number] Date of Birth: [birth year] Referring Provider:   Doristine Devoid Pulmonary Rehab Walk Test from 02/20/2023 in [facility]  Referring Provider Mannam       Initial Encounter Date:  Flowsheet Row Pulmonary Rehab Walk Test from 02/20/2023 in [facility]  Date 02/20/23       Visit Diagnosis: Interstitial lung disease (HCC)  Patient's Home Medications on Admission:   Current Outpatient Medications:    acetaminophen (TYLENOL) 500 MG tablet, Take 500-1,000 mg by mouth every 6 (six) hours as needed for moderate pain or headache., Disp: , Rfl:    amLODipine (NORVASC) 5 MG tablet, Take 1 tablet (5 mg total) by mouth daily. May take an additional 2.5 mg tablet if systolic blood pressure 160 if needed. (Patient taking differently: Take 2.5 mg by mouth daily.), Disp: 135 tablet, Rfl: 3   azithromycin (ZITHROMAX) 250 MG tablet, Take 1 tablet (250 mg total) by mouth daily. Take first 2 tablets together, then 1 every day until finished. (Patient not taking: Reported on 05/05/2023), Disp: 6 tablet, Rfl: 0   butalbital-acetaminophen-caffeine (FIORICET) 50-325-40 MG tablet, Take 1 tablet by mouth 2 (two) times daily as needed for migraine., Disp: , Rfl:    chlorpheniramine (CHLOR-TRIMETON) 4 MG tablet, Take 4 mg by mouth every 4 (four) hours as needed for allergies., Disp: , Rfl:    Cholecalciferol (VITAMIN D) 50 MCG (2000 UT) tablet, Take 1,000 Units by mouth daily. Tues and Thurs, Disp: , Rfl:    dextromethorphan (DELSYM) 30 MG/5ML liquid, Take 30 mg by mouth 2 (two) times daily as needed for cough., Disp: , Rfl:    epoetin alfa (EPOGEN) 10000 UNIT/ML injection, as directed Injection once every 3 weeks for 3 months, Disp: , Rfl:    Homeopathic Products (ARNICARE PAIN RELIEF EX), Apply 1 application.  topically daily as needed (pain)., Disp: , Rfl:    Respiratory Therapy Supplies (FLUTTER) DEVI, Use as directed., Disp: 1 each, Rfl: 0   rosuvastatin (CRESTOR) 5 MG tablet, Take 1 tablet (5 mg total) by mouth every Tuesday., Disp: 12 tablet, Rfl: 3   valsartan (DIOVAN) 320 MG tablet, Take 1 tablet (320 mg total) by mouth daily., Disp: 90 tablet, Rfl: 2   Wheat Dextrin (BENEFIBER PO), Take 1 Scoop by mouth daily., Disp: , Rfl:   Past Medical History: Past Medical History:  Diagnosis Date   Allergic rhinitis    Diabetes (HCC)    HTN (hypertension)    Migraines    PONV (postoperative nausea and vomiting)    Pulmonary fibrosis (HCC)     Tobacco Use: Social History   Tobacco Use  Smoking Status Former   Current packs/day: 0.00   Average packs/day: 0.5 packs/day for 18.0 years (9.0 ttl pk-yrs)   Types: Cigarettes   Start date: 07/06/1975   Quit date: 07/05/1993   Years since quitting: 29.8  Smokeless Tobacco Never    Labs: Review Flowsheet       Latest Ref Rng & Units 02/03/2023 05/05/2023  Labs for ITP Cardiac and Pulmonary Rehab  Cholestrol 100 - 199 mg/dL 409  -  LDL (calc) 0 - 99 mg/dL 87  -  HDL-C >81 mg/dL 94  -  Trlycerides 0 - 149 mg/dL 69  -  Hemoglobin X9J 4.8 - 5.6 % 6.5  6.5     Capillary  Blood Glucose: Lab Results  Component Value Date   GLUCAP 108 (H) 05/12/2023   GLUCAP 56 (L) 05/12/2023   GLUCAP 110 (H) 05/07/2023   GLUCAP 67 (L) 05/07/2023   GLUCAP 104 (H) 05/05/2023     Pulmonary Assessment Scores:  Pulmonary Assessment Scores     Row Name 02/20/23 1045 03/18/23 0816 05/12/23 0854     ADL UCSD   ADL Phase Entry -- Exit   SOB Score total 19 -- 29     CAT Score   CAT Score -- 7 12     mMRC Score   mMRC Score 2 -- 2           UCSD: Self-administered rating of dyspnea associated with activities of daily living (ADLs) 6-point scale (0 = "not at all" to 5 = "maximal or unable to do because of breathlessness")  Scoring Scores range from 0 to  120.  Minimally important difference is 5 units  CAT: CAT can identify the health impairment of COPD patients and is better correlated with disease progression.  CAT has a scoring range of zero to 40. The CAT score is classified into four groups of low (less than 10), medium (10 - 20), high (21-30) and very high (31-40) based on the impact level of disease on health status. A CAT score over 10 suggests significant symptoms.  A worsening CAT score could be explained by an exacerbation, poor medication adherence, poor inhaler technique, or progression of COPD or comorbid conditions.  CAT MCID is 2 points  mMRC: mMRC (Modified Medical Research Council) Dyspnea Scale is used to assess the degree of baseline functional disability in patients of respiratory disease due to dyspnea. No minimal important difference is established. A decrease in score of 1 point or greater is considered a positive change.   Pulmonary Function Assessment:  Pulmonary Function Assessment - 02/20/23 1216       Breath   Bilateral Breath Sounds Clear    Shortness of Breath Yes;Limiting activity             Exercise Target Goals: Exercise Program Goal: Individual exercise prescription set using results from initial 6 min walk test and THRR while considering  patient's activity barriers and safety.   Exercise Prescription Goal: Initial exercise prescription builds to 30-45 minutes a day of aerobic activity, 2-3 days per week.  Home exercise guidelines will be given to patient during program as part of exercise prescription that the participant will acknowledge.  Activity Barriers & Risk Stratification:  Activity Barriers & Cardiac Risk Stratification - 02/20/23 1108       Activity Barriers & Cardiac Risk Stratification   Activity Barriers Deconditioning;Muscular Weakness;Shortness of Breath;Joint Problems;Back Problems             6 Minute Walk:  6 Minute Walk     Row Name 02/20/23 1211 05/12/23 0847        6 Minute Walk   Phase Initial Discharge    Distance 1390 feet 1741 feet    Distance % Change -- 25.25 %    Distance Feet Change -- 351 ft    Walk Time 6 minutes 6 minutes    # of Rest Breaks 0 0    MPH 2.63 1.32    METS 3.23 3.96    RPE 11 14    Perceived Dyspnea  1 3.5    VO2 Peak 11.3 13.88    Symptoms No No    Resting HR 83 bpm 88 bpm  Resting BP 108/62 106/64    Resting Oxygen Saturation  100 % 94 %    Exercise Oxygen Saturation  during 6 min walk 89 % 96 %    Max Ex. HR 128 bpm 146 bpm    Max Ex. BP 140/62 126/62    2 Minute Post BP 118/64 110/66      Interval HR   1 Minute HR 107 117    2 Minute HR 115 138    3 Minute HR 120 143    4 Minute HR 124 146    5 Minute HR 128 146    6 Minute HR 126 141    2 Minute Post HR 88 106    Interval Heart Rate? Yes Yes      Interval Oxygen   Interval Oxygen? Yes Yes    Baseline Oxygen Saturation % 100 % 94 %    1 Minute Oxygen Saturation % 89 % 96 %    1 Minute Liters of Oxygen 0 L 0 L    2 Minute Oxygen Saturation % 95 % 94 %    2 Minute Liters of Oxygen 0 L 0 L    3 Minute Oxygen Saturation % 93 % 93 %    3 Minute Liters of Oxygen 0 L 0 L    4 Minute Oxygen Saturation % 95 % 94 %    4 Minute Liters of Oxygen 0 L 0 L    5 Minute Oxygen Saturation % 92 % 93 %    5 Minute Liters of Oxygen 0 L 0 L    6 Minute Oxygen Saturation % 93 % 94 %    6 Minute Liters of Oxygen 0 L 0 L    2 Minute Post Oxygen Saturation % 96 % 96 %    2 Minute Post Liters of Oxygen 0 L 0 L             Oxygen Initial Assessment:  Oxygen Initial Assessment - 02/20/23 1102       Home Oxygen   Home Oxygen Device None    Sleep Oxygen Prescription None    Home Exercise Oxygen Prescription None    Home Resting Oxygen Prescription None      Initial 6 min Walk   Oxygen Used None      Program Oxygen Prescription   Program Oxygen Prescription None      Intervention   Short Term Goals To learn and understand importance of maintaining  oxygen saturations>88%;To learn and exhibit compliance with exercise, home and travel O2 prescription;To learn and demonstrate proper use of respiratory medications;To learn and understand importance of monitoring SPO2 with pulse oximeter and demonstrate accurate use of the pulse oximeter.;To learn and demonstrate proper pursed lip breathing techniques or other breathing techniques.     Long  Term Goals Exhibits compliance with exercise, home  and travel O2 prescription;Verbalizes importance of monitoring SPO2 with pulse oximeter and return demonstration;Maintenance of O2 saturations>88%;Exhibits proper breathing techniques, such as pursed lip breathing or other method taught during program session;Compliance with respiratory medication;Demonstrates proper use of MDI's             Oxygen Re-Evaluation:  Oxygen Re-Evaluation     Row Name 03/09/23 1115 04/08/23 0846 05/04/23 0943         Program Oxygen Prescription   Program Oxygen Prescription None None None       Home Oxygen   Home Oxygen Device None None None  Sleep Oxygen Prescription None None None     Home Exercise Oxygen Prescription None None None     Home Resting Oxygen Prescription None None None       Goals/Expected Outcomes   Short Term Goals To learn and understand importance of maintaining oxygen saturations>88%;To learn and exhibit compliance with exercise, home and travel O2 prescription;To learn and demonstrate proper use of respiratory medications;To learn and understand importance of monitoring SPO2 with pulse oximeter and demonstrate accurate use of the pulse oximeter.;To learn and demonstrate proper pursed lip breathing techniques or other breathing techniques.  To learn and understand importance of maintaining oxygen saturations>88%;To learn and exhibit compliance with exercise, home and travel O2 prescription;To learn and demonstrate proper use of respiratory medications;To learn and understand importance of  monitoring SPO2 with pulse oximeter and demonstrate accurate use of the pulse oximeter.;To learn and demonstrate proper pursed lip breathing techniques or other breathing techniques.  To learn and understand importance of maintaining oxygen saturations>88%;To learn and exhibit compliance with exercise, home and travel O2 prescription;To learn and demonstrate proper use of respiratory medications;To learn and understand importance of monitoring SPO2 with pulse oximeter and demonstrate accurate use of the pulse oximeter.;To learn and demonstrate proper pursed lip breathing techniques or other breathing techniques.      Long  Term Goals Exhibits compliance with exercise, home  and travel O2 prescription;Verbalizes importance of monitoring SPO2 with pulse oximeter and return demonstration;Maintenance of O2 saturations>88%;Exhibits proper breathing techniques, such as pursed lip breathing or other method taught during program session;Compliance with respiratory medication;Demonstrates proper use of MDI's Exhibits compliance with exercise, home  and travel O2 prescription;Verbalizes importance of monitoring SPO2 with pulse oximeter and return demonstration;Maintenance of O2 saturations>88%;Exhibits proper breathing techniques, such as pursed lip breathing or other method taught during program session;Compliance with respiratory medication;Demonstrates proper use of MDI's Exhibits compliance with exercise, home  and travel O2 prescription;Verbalizes importance of monitoring SPO2 with pulse oximeter and return demonstration;Maintenance of O2 saturations>88%;Exhibits proper breathing techniques, such as pursed lip breathing or other method taught during program session;Compliance with respiratory medication;Demonstrates proper use of MDI's     Goals/Expected Outcomes Compliance and understanding of oxygen saturation monitoring and breathing techniques to decrease shortness of breath. Compliance and understanding of oxygen  saturation monitoring and breathing techniques to decrease shortness of breath. Compliance and understanding of oxygen saturation monitoring and breathing techniques to decrease shortness of breath.              Oxygen Discharge (Final Oxygen Re-Evaluation):  Oxygen Re-Evaluation - 05/04/23 0943       Program Oxygen Prescription   Program Oxygen Prescription None      Home Oxygen   Home Oxygen Device None    Sleep Oxygen Prescription None    Home Exercise Oxygen Prescription None    Home Resting Oxygen Prescription None      Goals/Expected Outcomes   Short Term Goals To learn and understand importance of maintaining oxygen saturations>88%;To learn and exhibit compliance with exercise, home and travel O2 prescription;To learn and demonstrate proper use of respiratory medications;To learn and understand importance of monitoring SPO2 with pulse oximeter and demonstrate accurate use of the pulse oximeter.;To learn and demonstrate proper pursed lip breathing techniques or other breathing techniques.     Long  Term Goals Exhibits compliance with exercise, home  and travel O2 prescription;Verbalizes importance of monitoring SPO2 with pulse oximeter and return demonstration;Maintenance of O2 saturations>88%;Exhibits proper breathing techniques, such as pursed lip breathing or other  method taught during program session;Compliance with respiratory medication;Demonstrates proper use of MDI's    Goals/Expected Outcomes Compliance and understanding of oxygen saturation monitoring and breathing techniques to decrease shortness of breath.             Initial Exercise Prescription:  Initial Exercise Prescription - 02/20/23 1200       Date of Initial Exercise RX and Referring Provider   Date 02/20/23    Referring Provider Mannam    Expected Discharge Date 05/14/23      Recumbant Elliptical   Level 1    RPM 20    Watts 40    Minutes 15    METs 2.5      Track   Minutes 15    METs 3.2       Prescription Details   Frequency (times per week) 2    Duration Progress to 30 minutes of continuous aerobic without signs/symptoms of physical distress      Intensity   THRR 40-80% of Max Heartrate 59-118    Ratings of Perceived Exertion 11-13    Perceived Dyspnea 0-4      Progression   Progression Continue to progress workloads to maintain intensity without signs/symptoms of physical distress.      Resistance Training   Training Prescription Yes    Weight red bands    Reps 10-15             Perform Capillary Blood Glucose checks as needed.  Exercise Prescription Changes:   Exercise Prescription Changes     Row Name 03/03/23 0900 03/31/23 0900 04/02/23 0934 04/28/23 0900 05/12/23 0900     Response to Exercise   Blood Pressure (Admit) 120/72 112/70 114/80 108/64 102/64   Blood Pressure (Exercise) 120/76 130/70 -- 118/62 126/62   Blood Pressure (Exit) 126/70 100/68 114/70 102/62 108/60   Heart Rate (Admit) 93 bpm 79 bpm 66 bpm 83 bpm 88 bpm   Heart Rate (Exercise) 118 bpm 123 bpm 116 bpm 119 bpm 146 bpm   Heart Rate (Exit) 103 bpm 98 bpm 79 bpm 91 bpm 100 bpm   Oxygen Saturation (Admit) 99 % 98 % 90 % 92 % 98 %   Oxygen Saturation (Exercise) 93 % 94 % 91 % 92 % 93 %   Oxygen Saturation (Exit) 93 % 99 % 91 % 97 % 94 %   Rating of Perceived Exertion (Exercise) 13 13 13 13 14    Perceived Dyspnea (Exercise) 2 3 3 1  3.5   Duration Continue with 30 min of aerobic exercise without signs/symptoms of physical distress. Continue with 30 min of aerobic exercise without signs/symptoms of physical distress. Continue with 30 min of aerobic exercise without signs/symptoms of physical distress. Continue with 30 min of aerobic exercise without signs/symptoms of physical distress. Continue with 30 min of aerobic exercise without signs/symptoms of physical distress.   Intensity THRR unchanged THRR unchanged THRR unchanged THRR New  increased to 133 THRR unchanged     Progression    Progression Continue to progress workloads to maintain intensity without signs/symptoms of physical distress. Continue to progress workloads to maintain intensity without signs/symptoms of physical distress. Continue to progress workloads to maintain intensity without signs/symptoms of physical distress. Continue to progress workloads to maintain intensity without signs/symptoms of physical distress. Continue to progress workloads to maintain intensity without signs/symptoms of physical distress.     Resistance Training   Training Prescription Yes Yes Yes Yes Yes   Weight red bands red bands red bands  blue bands blue bands   Reps 10-15 10-15 10-15 10-15 10-15   Time 10 Minutes 10 Minutes 10 Minutes 10 Minutes 10 Minutes     Treadmill   MPH -- 2.3 2.4 2.4 2.3   Grade -- 1 1.5 1 1    Minutes -- 15 15 15 15    METs -- 2.9 3.4 3 2.9     Recumbant Elliptical   Level 1 2 3 2 3    RPM 57 -- -- 49 --   Watts -- -- -- 58 --   Minutes 15 15 15 15 15    METs 3.7 3.7 3.9 3 3.5     Track   Laps 13 -- -- -- --   Minutes 15 -- -- -- --   METs 3 -- -- -- --            Exercise Comments:   Exercise Comments     Row Name 02/26/23 0950 05/07/23 1535         Exercise Comments Brandy Medina completed her first day of exercise. She exercised for 15 min on the track and recumbent elliptical. Brandy Medina averaged 2.85 METs on the track and 3.4 METs at level 1 on the recumbent elliptical. She performed the warmup and cooldown standing without limitations. Discussed METs. Completed home exercise plan. Brandy Medina is only doing resistance exercises at home but not aerobic exercise. I encouraged her to walk either at the [facility] or outside for 3 days/wk for 30 min/day. She agreed with my recommendations. She seems motivated to exercise and improve her functional capacity. Will check on home exercise later.               Exercise Goals and Review:   Exercise Goals     Row Name 02/20/23 1045             Exercise  Goals   Increase Physical Activity Yes       Intervention Provide advice, education, support and counseling about physical activity/exercise needs.;Develop an individualized exercise prescription for aerobic and resistive training based on initial evaluation findings, risk stratification, comorbidities and participant's personal goals.       Expected Outcomes Short Term: Attend rehab on a regular basis to increase amount of physical activity.;Long Term: Exercising regularly at least 3-5 days a week.;Long Term: Add in home exercise to make exercise part of routine and to increase amount of physical activity.       Increase Strength and Stamina Yes       Intervention Provide advice, education, support and counseling about physical activity/exercise needs.;Develop an individualized exercise prescription for aerobic and resistive training based on initial evaluation findings, risk stratification, comorbidities and participant's personal goals.       Expected Outcomes Short Term: Increase workloads from initial exercise prescription for resistance, speed, and METs.;Short Term: Perform resistance training exercises routinely during rehab and add in resistance training at home;Long Term: Improve cardiorespiratory fitness, muscular endurance and strength as measured by increased METs and functional capacity ( )       Able to understand and use rate of perceived exertion (RPE) scale Yes       Intervention Provide education and explanation on how to use RPE scale       Expected Outcomes Short Term: Able to use RPE daily in rehab to express subjective intensity level;Long Term:  Able to use RPE to guide intensity level when exercising independently       Able to understand and use Dyspnea scale Yes  Intervention Provide education and explanation on how to use Dyspnea scale       Expected Outcomes Short Term: Able to use Dyspnea scale daily in rehab to express subjective sense of shortness of breath during  exertion;Long Term: Able to use Dyspnea scale to guide intensity level when exercising independently       Knowledge and understanding of Target Heart Rate Range (THRR) Yes       Intervention Provide education and explanation of THRR including how the numbers were predicted and where they are located for reference       Expected Outcomes Short Term: Able to state/look up THRR;Long Term: Able to use THRR to govern intensity when exercising independently;Short Term: Able to use daily as guideline for intensity in rehab       Understanding of Exercise Prescription Yes       Intervention Provide education, explanation, and written materials on patient's individual exercise prescription       Expected Outcomes Short Term: Able to explain program exercise prescription;Long Term: Able to explain home exercise prescription to exercise independently                Exercise Goals Re-Evaluation :  Exercise Goals Re-Evaluation     Row Name 03/09/23 1108 04/08/23 0843 05/04/23 0933         Exercise Goal Re-Evaluation   Exercise Goals Review Increase Physical Activity;Able to understand and use Dyspnea scale;Understanding of Exercise Prescription;Increase Strength and Stamina;Knowledge and understanding of Target Heart Rate Range (THRR);Able to understand and use rate of perceived exertion (RPE) scale Increase Physical Activity;Able to understand and use Dyspnea scale;Understanding of Exercise Prescription;Increase Strength and Stamina;Knowledge and understanding of Target Heart Rate Range (THRR);Able to understand and use rate of perceived exertion (RPE) scale Increase Physical Activity;Able to understand and use Dyspnea scale;Understanding of Exercise Prescription;Increase Strength and Stamina;Knowledge and understanding of Target Heart Rate Range (THRR);Able to understand and use rate of perceived exertion (RPE) scale     Comments Brandy Medina has completed 2 exercise sessions. She exercises for 15 min on the  track and recumbent elliptical. Brandy Medina 3.0 METs on the track and 3.7 METs at level 1 on the recumbent elliptical. She performs the warmup and cooldown standing without limitations. It is too soon to notate any progressions. Brandy Medina is scheduled to be out of rehab for the next couple of weeks. Will continue to monitor and progress as able. Brandy Medina has completed 6 exercise sessions. She exercises for 15 min on the treadmill and recumbent elliptical. Brandy Medina averages 3.4 METs on the treadmill and 3.9 METs at level 3 on the recumbent elliptical. She performs the warmup and cooldown standing without limitations. Brandy Medina has increased her level several times on the recumbent elliptical as METs have increased. She has also transitioned from the track to the treadmill. She tolerates the level increases and treadmill well. Brandy Medina seems motivated to exercise and improve her functional capacity. Will continue to monitor and progress as able. Brandy Medina has completed 9 exercise sessions. She exercises for 15 min on the treadmill and recumbent elliptical. Brandy Medina averages 3.3 METs at 2.3 mph and 2% inclines on the treadmill and 3.3 METs at level 2 on the recumbent elliptical. She performs the warmup and cooldown standing without limitations. Brandy Medina has increased her speed and incline the treadmill as METs have increased. I encouraged her to increase her level on the recumbent elliptical. I am unsure why her level decreased. Will encourage her to increase her level. I have not  discussed home exercise yet but plan to soon.     Expected Outcomes Through exercise at rehab and home, the patient will decrease shortness of breath with daily activities and feel confident in carrying out an exercise regimen at home. Through exercise at rehab and home, the patient will decrease shortness of breath with daily activities and feel confident in carrying out an exercise regimen at home. Through exercise at rehab and home, the patient will  decrease shortness of breath with daily activities and feel confident in carrying out an exercise regimen at home.              Discharge Exercise Prescription (Final Exercise Prescription Changes):  Exercise Prescription Changes - 05/12/23 0900       Response to Exercise   Blood Pressure (Admit) 102/64    Blood Pressure (Exercise) 126/62    Blood Pressure (Exit) 108/60    Heart Rate (Admit) 88 bpm    Heart Rate (Exercise) 146 bpm    Heart Rate (Exit) 100 bpm    Oxygen Saturation (Admit) 98 %    Oxygen Saturation (Exercise) 93 %    Oxygen Saturation (Exit) 94 %    Rating of Perceived Exertion (Exercise) 14    Perceived Dyspnea (Exercise) 3.5    Duration Continue with 30 min of aerobic exercise without signs/symptoms of physical distress.    Intensity THRR unchanged      Progression   Progression Continue to progress workloads to maintain intensity without signs/symptoms of physical distress.      Resistance Training   Training Prescription Yes    Weight blue bands    Reps 10-15    Time 10 Minutes      Treadmill   MPH 2.3    Grade 1    Minutes 15    METs 2.9      Recumbant Elliptical   Level 3    Minutes 15    METs 3.5             Nutrition:  Target Goals: Understanding of nutrition guidelines, daily intake of sodium 1500mg , cholesterol 200mg , calories 30% from fat and 7% or less from saturated fats, daily to have 5 or more servings of fruits and vegetables.  Biometrics:  Pre Biometrics - 02/20/23 1215       Pre Biometrics   Grip Strength 19 kg              Nutrition Therapy Plan and Nutrition Goals:  Nutrition Therapy & Goals - 05/07/23 0935       Nutrition Therapy   Diet General Healthy Diet    Drug/Food Interactions Statins/Certain Fruits      Personal Nutrition Goals   Nutrition Goal Patient to improve diet quality by using the plate method as a guide for meal planning to include lean protein/plant protein, fruits, vegetables, whole  grains, nonfat dairy as part of a well-balanced diet.    Comments Goals in progress. Pritika has medical history of ILD, DM2, HTN, hypoglycemia. She started jardiance for elevated A1c in late January 2025 but has since stopped due to hypoglycemia, fatigue. Lipids WNL. She is down 8.8# since her orientation to our program. We have reviewed multiple strategies for blood sugar control including the 15 rule, eating frequency, benefits of high protein/high fiber eating patterns, etc. She will follow-up with endocrinology regarding hypoglycemia episodes. Patient will benefit from participation in pulmonary rehab for nutrition, exercise, and lifestyle modification.      Intervention Plan   Intervention  Prescribe, educate and counsel regarding individualized specific dietary modifications aiming towards targeted core components such as weight, hypertension, lipid management, diabetes, heart failure and other comorbidities.;Nutrition handout(s) given to patient.    Expected Outcomes Short Term Goal: Understand basic principles of dietary content, such as calories, fat, sodium, cholesterol and nutrients.;Long Term Goal: Adherence to prescribed nutrition plan.             Nutrition Assessments:  MEDIFICTS Score Key: >=70 Need to make dietary changes  40-70 Heart Healthy Diet <= 40 Therapeutic Level Cholesterol Diet   Picture Your Plate Scores: <78 Unhealthy dietary pattern with much room for improvement. 41-50 Dietary pattern unlikely to meet recommendations for good health and room for improvement. 51-60 More healthful dietary pattern, with some room for improvement.  >60 Healthy dietary pattern, although there may be some specific behaviors that could be improved.    Nutrition Goals Re-Evaluation:  Nutrition Goals Re-Evaluation     Row Name 04/07/23 1113 05/07/23 0935           Goals   Current Weight 158 lb 15.2 oz (72.1 kg)  weight from last attended session on 04/02/23 157 lb 3 oz (71.3  kg)      Comment A1c 6.5, lipids WNL, LDL 87 no new labs; most recent labs  A1c 6.5, lipids WNL, LDL 87      Expected Outcome Brandy Medina has medical history of ILD, DM2, HTN. She started jardiance for elevated A1c in late January 2025. Lipids WNL. She is down ~7# since her orientation to our program; she has maintained her weight for >1 month. Patient will benefit from participation in pulmonary rehab for nutrition, exercise, and lifestyle modification. Goals in progress. Brandy Medina has medical history of ILD, DM2, HTN, hypoglycemia. She started jardiance for elevated A1c in late January 2025 but has since stopped due to hypoglycemia, fatigue. Lipids WNL. She is down 8.8# since her orientation to our program. We have reviewed multiple strategies for blood sugar control including the 15 rule, eating frequency, benefits of high protein/high fiber eating patterns, etc. She will follow-up with endocrinology regarding hypoglycemia episodes. Patient will benefit from participation in pulmonary rehab for nutrition, exercise, and lifestyle modification.               Nutrition Goals Discharge (Final Nutrition Goals Re-Evaluation):  Nutrition Goals Re-Evaluation - 05/07/23 0935       Goals   Current Weight 157 lb 3 oz (71.3 kg)    Comment no new labs; most recent labs  A1c 6.5, lipids WNL, LDL 87    Expected Outcome Goals in progress. Brandy Medina has medical history of ILD, DM2, HTN, hypoglycemia. She started jardiance for elevated A1c in late January 2025 but has since stopped due to hypoglycemia, fatigue. Lipids WNL. She is down 8.8# since her orientation to our program. We have reviewed multiple strategies for blood sugar control including the 15 rule, eating frequency, benefits of high protein/high fiber eating patterns, etc. She will follow-up with endocrinology regarding hypoglycemia episodes. Patient will benefit from participation in pulmonary rehab for nutrition, exercise, and lifestyle modification.              Psychosocial: Target Goals: Acknowledge presence or absence of significant depression and/or stress, maximize coping skills, provide positive support system. Participant is able to verbalize types and ability to use techniques and skills needed for reducing stress and depression.  Initial Review & Psychosocial Screening:  Initial Psych Review & Screening - 02/20/23 1046  Initial Review   Current issues with None Identified      Family Dynamics   Good Support System? Yes    Comments spouse, daughter, and son      Barriers   Psychosocial barriers to participate in program There are no identifiable barriers or psychosocial needs.             Quality of Life Scores:  Scores of 19 and below usually indicate a poorer quality of life in these areas.  A difference of  2-3 points is a clinically meaningful difference.  A difference of 2-3 points in the total score of the Quality of Life Index has been associated with significant improvement in overall quality of life, self-image, physical symptoms, and general health in studies assessing change in quality of life.  PHQ-9: Review Flowsheet       05/12/2023 04/24/2023 02/20/2023 02/03/2023  Depression screen PHQ 2/9  Decreased Interest 0 0 0 0  Down, Depressed, Hopeless 0 0 0 0  PHQ - 2 Score 0 0 0 0  Altered sleeping 0 - 0 0  Tired, decreased energy 1 - 0 0  Change in appetite 0 - 0 0  Feeling bad or failure about yourself  0 - 0 0  Trouble concentrating 0 - 0 0  Moving slowly or fidgety/restless 0 - 0 0  Suicidal thoughts 0 - 0 0  PHQ-9 Score 1 - 0 0  Difficult doing work/chores Not difficult at all - Not difficult at all Not difficult at all   Interpretation of Total Score  Total Score Depression Severity:  1-4 = Minimal depression, 5-9 = Mild depression, 10-14 = Moderate depression, 15-19 = Moderately severe depression, 20-27 = Severe depression   Psychosocial Evaluation and Intervention:  Psychosocial  Evaluation - 02/20/23 1046       Psychosocial Evaluation & Interventions   Interventions Encouraged to exercise with the program and follow exercise prescription    Comments Brandy Medina denies any psychosocial barriers at this time.    Expected Outcomes For Cola to participate in rehab free of psychosocial concerns.    Continue Psychosocial Services  No Follow up required             Psychosocial Re-Evaluation:  Psychosocial Re-Evaluation     Row Name 03/11/23 1416 04/06/23 1443 05/04/23 1354         Psychosocial Re-Evaluation   Current issues with None Identified None Identified None Identified     Comments At re-eval, Brandy Medina continues to deny any psy/soc barriers or concerns Brandy Medina continues to deny any psychosocial barriers or concerns at this time. Brandy Medina continues to deny any psychosocial barriers or concerns at this time.     Expected Outcomes For Colleene to participate in rehab free of psychosocial concerns. For Phaedra to participate in rehab free of psychosocial concerns. For Cynia to participate in rehab free of psychosocial concerns.     Interventions Encouraged to attend Pulmonary Rehabilitation for the exercise Encouraged to attend Pulmonary Rehabilitation for the exercise Encouraged to attend Pulmonary Rehabilitation for the exercise     Continue Psychosocial Services  No Follow up required No Follow up required No Follow up required              Psychosocial Discharge (Final Psychosocial Re-Evaluation):  Psychosocial Re-Evaluation - 05/04/23 1354       Psychosocial Re-Evaluation   Current issues with None Identified    Comments Brandy Medina continues to deny any psychosocial barriers or concerns at this time.  Expected Outcomes For Brandy Medina to participate in rehab free of psychosocial concerns.    Interventions Encouraged to attend Pulmonary Rehabilitation for the exercise    Continue Psychosocial Services  No Follow up required              Education: Education Goals: Education classes will be provided on a weekly basis, covering required topics. Participant will state understanding/return demonstration of topics presented.  Learning Barriers/Preferences:  Learning Barriers/Preferences - 02/20/23 1047       Learning Barriers/Preferences   Learning Barriers Sight   wears glasses   Learning Preferences None             Education Topics: Know Your Numbers Group instruction that is supported by a PowerPoint presentation. Instructor discusses importance of knowing and understanding resting, exercise, and post-exercise oxygen saturation, heart rate, and blood pressure. Oxygen saturation, heart rate, blood pressure, rating of perceived exertion, and dyspnea are reviewed along with a normal range for these values.  Flowsheet Row PULMONARY REHAB OTHER RESPIRATORY from 04/30/2023 in [facility]  Date 04/30/23  Educator EP  Instruction Review Code 1- Verbalizes Understanding       Exercise for the Pulmonary Patient Group instruction that is supported by a PowerPoint presentation. Instructor discusses benefits of exercise, core components of exercise, frequency, duration, and intensity of an exercise routine, importance of utilizing pulse oximetry during exercise, safety while exercising, and options of places to exercise outside of rehab.  Flowsheet Row PULMONARY REHAB OTHER RESPIRATORY from 04/23/2023 in [facility]  Date 04/23/23  Educator 1  Instruction Review Code 1- Verbalizes Understanding       MET Level  Group instruction provided by PowerPoint, verbal discussion, and written material to support subject matter. Instructor reviews what METs are and how to increase METs.  Flowsheet Row PULMONARY REHAB OTHER RESPIRATORY from 03/24/2023 in [facility], V[facility], & Lung  Health  Date 03/24/23  Educator EP  Instruction Review Code 1- Verbalizes Understanding       Pulmonary Medications Verbally interactive group education provided by instructor with focus on inhaled medications and proper administration.   Anatomy and Physiology of the Respiratory System Group instruction provided by PowerPoint, verbal discussion, and written material to support subject matter. Instructor reviews respiratory cycle and anatomical components of the respiratory system and their functions. Instructor also reviews differences in obstructive and restrictive respiratory diseases with examples of each.  Flowsheet Row PULMONARY REHAB OTHER RESPIRATORY from 04/02/2023 in [facility], [facility]  Date 04/02/23  Educator RT  Instruction Review Code 1- Verbalizes Understanding       Oxygen Safety Group instruction provided by PowerPoint, verbal discussion, and written material to support subject matter. There is an overview of "What is Oxygen" and "Why do we need it".  Instructor also reviews how to create a safe environment for oxygen use, the importance of using oxygen as prescribed, and the risks of noncompliance. There is a brief discussion on traveling with oxygen and resources the patient may utilize.   Oxygen Use Group instruction provided by PowerPoint, verbal discussion, and written material to discuss how supplemental oxygen is prescribed and different types of oxygen supply systems. Resources for more information are provided.    Breathing Techniques Group instruction that is supported by demonstration and informational handouts. Instructor discusses the benefits of pursed lip  and diaphragmatic breathing and detailed demonstration on how to perform both.     Risk Factor Reduction Group instruction that is supported by a PowerPoint presentation. Instructor discusses the definition of a risk factor, different risk factors for  pulmonary disease, and how the heart and lungs work together.   Pulmonary Diseases Group instruction provided by PowerPoint, verbal discussion, and written material to support subject matter. Instructor gives an overview of the different type of pulmonary diseases. There is also a discussion on risk factors and symptoms as well as ways to manage the diseases. Flowsheet Row PULMONARY REHAB OTHER RESPIRATORY from 03/26/2023 in [facility], V[facility], [facility]  Date 03/26/23  Educator RT  Instruction Review Code 1- Verbalizes Understanding       Stress and Energy Conservation Group instruction provided by PowerPoint, verbal discussion, and written material to support subject matter. Instructor gives an overview of stress and the impact it can have on the body. Instructor also reviews ways to reduce stress. There is also a discussion on energy conservation and ways to conserve energy throughout the day. Flowsheet Row PULMONARY REHAB OTHER RESPIRATORY from 02/26/2023 in [facility], [facility]  Date 02/26/23  Educator RN  Instruction Review Code 1- Verbalizes Understanding       Warning Signs and Symptoms Group instruction provided by PowerPoint, verbal discussion, and written material to support subject matter. Instructor reviews warning signs and symptoms of stroke, heart attack, cold and flu. Instructor also reviews ways to prevent the spread of infection.   Other Education Group or individual verbal, written, or video instructions that support the educational goals of the pulmonary rehab program.    Knowledge Questionnaire Score:  Knowledge Questionnaire Score - 05/12/23 0853       Knowledge Questionnaire Score   Post Score 18/18             Core Components/Risk Factors/Patient Goals at Admission:  Personal Goals and Risk Factors at Admission - 02/20/23 1047       Core Components/Risk  Factors/Patient Goals on Admission   Improve shortness of breath with ADL's Yes    Intervention Provide education, individualized exercise plan and daily activity instruction to help decrease symptoms of SOB with activities of daily living.    Expected Outcomes Short Term: Improve cardiorespiratory fitness to achieve a reduction of symptoms when performing ADLs;Long Term: Be able to perform more ADLs without symptoms or delay the onset of symptoms             Core Components/Risk Factors/Patient Goals Review:   Goals and Risk Factor Review     Row Name 03/11/23 1417 04/06/23 1443 05/04/23 1354         Core Components/Risk Factors/Patient Goals Review   Personal Goals Review Improve shortness of breath with ADL's;Develop more efficient breathing techniques such as purse lipped breathing and diaphragmatic breathing and practicing self-pacing with activity. Improve shortness of breath with ADL's;Develop more efficient breathing techniques such as purse lipped breathing and diaphragmatic breathing and practicing self-pacing with activity. Improve shortness of breath with ADL's;Develop more efficient breathing techniques such as purse lipped breathing and diaphragmatic breathing and practicing self-pacing with activity.     Review Unable to assess goals. Brandy Medina has completed 2 sessions so far. Goal progressing for improving shortness of breath. Brandy Medina is currently able to maintain sats >88% on RA while exercising. She is currently exercising on the Treadmill and the Recumbant Elliptical. Goal  progressing for developing more efficient breathing techniques such as purse lipped breathing and diaphragmatic breathing; and practicing self-pacing with activity. She is able to rate her perceived exertion score whenasked by staff. We will continue to monitor Brandy Medina's progress throughout the program. Monthly review of patient's Core Components/Risk Factors/Patient Goals are as follows: Goal progressing for  improving shortness of breath. Gladine is currently able to maintain sats >88% on RA while exercising. She is currently exercising on the Treadmill and the Recumbant Elliptical. Goal progressing for developing more efficient breathing techniques such as purse lipped breathing and diaphragmatic breathing; and practicing self-pacing with activity. We will continue to monitor Josefa's progress throughout the program.     Expected Outcomes For Benay to improve her shortness of breath with ADLs and develop more efficient breathing techniques such as purse lipped breathing and diaphragmatic breathing; and practicing self-pacing with activity For Wyoma to improve her shortness of breath with ADLs and develop more efficient breathing techniques such as purse lipped breathing and diaphragmatic breathing; and practicing self-pacing with activity For Tiki to improve her shortness of breath with ADLs and develop more efficient breathing techniques such as purse lipped breathing and diaphragmatic breathing; and practicing self-pacing with activity              Core Components/Risk Factors/Patient Goals at Discharge (Final Review):   Goals and Risk Factor Review - 05/04/23 1354       Core Components/Risk Factors/Patient Goals Review   Personal Goals Review Improve shortness of breath with ADL's;Develop more efficient breathing techniques such as purse lipped breathing and diaphragmatic breathing and practicing self-pacing with activity.    Review Monthly review of patient's Core Components/Risk Factors/Patient Goals are as follows: Goal progressing for improving shortness of breath. Machell is currently able to maintain sats >88% on RA while exercising. She is currently exercising on the Treadmill and the Recumbant Elliptical. Goal progressing for developing more efficient breathing techniques such as purse lipped breathing and diaphragmatic breathing; and practicing self-pacing with activity. We will continue to  monitor Serita's progress throughout the program.    Expected Outcomes For Dudley to improve her shortness of breath with ADLs and develop more efficient breathing techniques such as purse lipped breathing and diaphragmatic breathing; and practicing self-pacing with activity             ITP Comments:Pt is making expected progress toward Pulmonary Rehab goals after completing 12 session(s). Recommend continued exercise, life style modification, education, and utilization of breathing techniques to increase stamina and strength, while also decreasing shortness of breath with exertion.  Dr. Genetta Kenning is Medical Director for Pulmonary Rehab at [facility].     Comments:

## 2023-05-14 ENCOUNTER — Encounter (HOSPITAL_COMMUNITY)
Admission: RE | Admit: 2023-05-14 | Discharge: 2023-05-14 | Disposition: A | Discharge status: Home / Self Care | Payer: Medicare PPO | Source: Ambulatory Visit | Attending: Pulmonary Disease | Admitting: Pulmonary Disease

## 2023-05-14 DIAGNOSIS — J849 Interstitial pulmonary disease, unspecified: Secondary | ICD-10-CM

## 2023-05-14 LAB — GLUCOSE, CAPILLARY: Glucose-Capillary: 100 mg/dL — ABNORMAL HIGH (ref 70–99)

## 2023-05-14 NOTE — Progress Notes (Signed)
 Daily Session Note  Patient Details  Name: Brandy Medina MRN: [id number] Date of Birth: 10-25-1950 Referring Provider:   Gattis Kass Pulmonary Rehab Walk Test from 02/20/2023 in [facility]  Referring Provider Mannam       Encounter Date: 05/14/2023  Check In:  Session Check In - 05/14/23 0865       Check-In   Supervising physician immediately available to respond to emergencies [facility] MD immediately available    Physician(s) Rejeana Card, NP    Location [facility]    Staff Present Atlas Lea, MS, ACSM-CEP, Exercise Physiologist;Casey Carmen Chol, RN, BSN;Kathye Cipriani BS, ACSM-CEP, Exercise Physiologist    Virtual Visit No    Medication changes reported     No    Fall or balance concerns reported    No    Tobacco Cessation No Change    Warm-up and Cool-down Performed as group-led instruction    Resistance Training Performed Yes    VAD Patient? No    PAD/SET Patient? No      Pain Assessment   Currently in Pain? No/denies    Pain Score 0-No pain    Multiple Pain Sites No             Capillary Blood Glucose: Results for orders placed or performed during the hospital encounter of 05/14/23 (from the past 24 hours)  Glucose, capillary     Status: Abnormal   Collection Time: 05/14/23  9:30 AM  Result Value Ref Range   Glucose-Capillary 100 (H) 70 - 99 mg/dL      Social History   Tobacco Use  Smoking Status Former   Current packs/day: 0.00   Average packs/day: 0.5 packs/day for 18.0 years (9.0 ttl pk-yrs)   Types: Cigarettes   Start date: 07/06/1975   Quit date: 07/05/1993   Years since quitting: 29.8  Smokeless Tobacco Never    Goals Met:  Exercise tolerated well No report of concerns or symptoms today Strength training completed today  Goals Unmet:  Not Applicable  Comments: Pt graduated today. Service time is from 0810 to 0930.    Dr. Genetta Kenning is Medical  Director for Pulmonary Rehab at [facility].

## 2023-05-14 NOTE — Telephone Encounter (Signed)
 Results of skin biopsy discussed with patient via telephone which showed reduced epidermal nerve fiber density at the calf and normal at the ankle.  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  Medications discussed for pain, but she prefers to monitor for now. All questions answered.  Follow-up in 2 months.

## 2023-05-14 NOTE — Progress Notes (Signed)
 Discharge Progress Report  Patient Details  Name: Brandy Medina MRN: [id number] Date of Birth: 02-20-1950 Referring Provider:   Gattis Kass Pulmonary Rehab Walk Test from 02/20/2023 in [facility]  Referring Provider Mannam        Number of Visits: 13  Reason for Discharge:  Patient reached a stable level of exercise. Patient independent in their exercise. Patient has met program and personal goals.  Smoking History:  Social History   Tobacco Use  Smoking Status Former   Current packs/day: 0.00   Average packs/day: 0.5 packs/day for 18.0 years (9.0 ttl pk-yrs)   Types: Cigarettes   Start date: 07/06/1975   Quit date: 07/05/1993   Years since quitting: 29.8  Smokeless Tobacco Never    Diagnosis:  Interstitial lung disease (HCC)  ADL [facility]:  Pulmonary Assessment Scores     Row Name 02/20/23 1045 03/18/23 0816 05/12/23 0854     ADL [facility]   ADL Phase Entry -- Exit   SOB Score total 19 -- 29     CAT Score   CAT Score -- 7 12     mMRC Score   mMRC Score 2 -- 2            Initial Exercise Prescription:  Initial Exercise Prescription - 02/20/23 1200       Date of Initial Exercise RX and Referring Provider   Date 02/20/23    Referring Provider Mannam    Expected Discharge Date 05/14/23      Recumbant Elliptical   Level 1    RPM 20    Watts 40    Minutes 15    METs 2.5      Track   Minutes 15    METs 3.2      Prescription Details   Frequency (times per week) 2    Duration Progress to 30 minutes of continuous aerobic without signs/symptoms of physical distress      Intensity   THRR 40-80% of Max Heartrate 59-118    Ratings of Perceived Exertion 11-13    Perceived Dyspnea 0-4      Progression   Progression Continue to progress workloads to maintain intensity without signs/symptoms of physical distress.      Resistance Training   Training Prescription Yes    Weight red bands    Reps  10-15             Discharge Exercise Prescription (Final Exercise Prescription Changes):  Exercise Prescription Changes - 05/12/23 0900       Response to Exercise   Blood Pressure (Admit) 102/64    Blood Pressure (Exercise) 126/62    Blood Pressure (Exit) 108/60    Heart Rate (Admit) 88 bpm    Heart Rate (Exercise) 146 bpm    Heart Rate (Exit) 100 bpm    Oxygen Saturation (Admit) 98 %    Oxygen Saturation (Exercise) 93 %    Oxygen Saturation (Exit) 94 %    Rating of Perceived Exertion (Exercise) 14    Perceived Dyspnea (Exercise) 3.5    Duration Continue with 30 min of aerobic exercise without signs/symptoms of physical distress.    Intensity THRR unchanged      Progression   Progression Continue to progress workloads to maintain intensity without signs/symptoms of physical distress.      Resistance Training   Training Prescription Yes    Weight blue bands    Reps 10-15    Time  10 Minutes      Treadmill   MPH 2.3    Grade 1    Minutes 15    METs 2.9      Recumbant Elliptical   Level 3    Minutes 15    METs 3.5             Functional Capacity:  6 Minute Walk     Row Name 02/20/23 1211 05/12/23 0847       6 Minute Walk   Phase Initial Discharge    Distance 1390 feet 1741 feet    Distance % Change -- 25.25 %    Distance Feet Change -- 351 ft    Walk Time 6 minutes 6 minutes    # of Rest Breaks 0 0    MPH 2.63 1.32    METS 3.23 3.96    RPE 11 14    Perceived Dyspnea  1 3.5    VO2 Peak 11.3 13.88    Symptoms No No    Resting HR 83 bpm 88 bpm    Resting BP 108/62 106/64    Resting Oxygen Saturation  100 % 94 %    Exercise Oxygen Saturation  during 6 min walk 89 % 96 %    Max Ex. HR 128 bpm 146 bpm    Max Ex. BP 140/62 126/62    2 Minute Post BP 118/64 110/66      Interval HR   1 Minute HR 107 117    2 Minute HR 115 138    3 Minute HR 120 143    4 Minute HR 124 146    5 Minute HR 128 146    6 Minute HR 126 141    2 Minute Post HR 88 106     Interval Heart Rate? Yes Yes      Interval Oxygen   Interval Oxygen? Yes Yes    Baseline Oxygen Saturation % 100 % 94 %    1 Minute Oxygen Saturation % 89 % 96 %    1 Minute Liters of Oxygen 0 L 0 L    2 Minute Oxygen Saturation % 95 % 94 %    2 Minute Liters of Oxygen 0 L 0 L    3 Minute Oxygen Saturation % 93 % 93 %    3 Minute Liters of Oxygen 0 L 0 L    4 Minute Oxygen Saturation % 95 % 94 %    4 Minute Liters of Oxygen 0 L 0 L    5 Minute Oxygen Saturation % 92 % 93 %    5 Minute Liters of Oxygen 0 L 0 L    6 Minute Oxygen Saturation % 93 % 94 %    6 Minute Liters of Oxygen 0 L 0 L    2 Minute Post Oxygen Saturation % 96 % 96 %    2 Minute Post Liters of Oxygen 0 L 0 L             Psychological, QOL, Others - Outcomes: PHQ 2/9:    05/12/2023    8:52 AM 04/24/2023    8:31 AM 02/20/2023   10:44 AM 02/03/2023    2:09 PM  Depression screen PHQ 2/9  Decreased Interest 0 0 0 0  Down, Depressed, Hopeless 0 0 0 0  PHQ - 2 Score 0 0 0 0  Altered sleeping 0  0 0  Tired, decreased energy 1  0 0  Change in  appetite 0  0 0  Feeling bad or failure about yourself  0  0 0  Trouble concentrating 0  0 0  Moving slowly or fidgety/restless 0  0 0  Suicidal thoughts 0  0 0  PHQ-9 Score 1  0 0  Difficult doing work/chores Not difficult at all  Not difficult at all Not difficult at all    Quality of Life:   Personal Goals: Goals established at orientation with interventions provided to work toward goal.  Personal Goals and Risk Factors at Admission - 02/20/23 1047       Core Components/Risk Factors/Patient Goals on Admission   Improve shortness of breath with ADL's Yes    Intervention Provide education, individualized exercise plan and daily activity instruction to help decrease symptoms of SOB with activities of daily living.    Expected Outcomes Short Term: Improve cardiorespiratory fitness to achieve a reduction of symptoms when performing ADLs;Long Term: Be able to perform  more ADLs without symptoms or delay the onset of symptoms              Personal Goals Discharge:  Goals and Risk Factor Review     Row Name 03/11/23 1417 04/06/23 1443 05/04/23 1354 05/14/23 1533       Core Components/Risk Factors/Patient Goals Review   Personal Goals Review Improve shortness of breath with ADL's;Develop more efficient breathing techniques such as purse lipped breathing and diaphragmatic breathing and practicing self-pacing with activity. Improve shortness of breath with ADL's;Develop more efficient breathing techniques such as purse lipped breathing and diaphragmatic breathing and practicing self-pacing with activity. Improve shortness of breath with ADL's;Develop more efficient breathing techniques such as purse lipped breathing and diaphragmatic breathing and practicing self-pacing with activity. Improve shortness of breath with ADL's;Develop more efficient breathing techniques such as purse lipped breathing and diaphragmatic breathing and practicing self-pacing with activity.    Review Unable to assess goals. Brandy Medina has completed 2 sessions so far. Goal progressing for improving shortness of breath. Brandy Medina is currently able to maintain sats >88% on RA while exercising. She is currently exercising on the Treadmill and the Recumbant Elliptical. Goal progressing for developing more efficient breathing techniques such as purse lipped breathing and diaphragmatic breathing; and practicing self-pacing with activity. She is able to rate her perceived exertion score whenasked by staff. We will continue to monitor Brandy Medina's progress throughout the program. Monthly review of patient's Core Components/Risk Factors/Patient Goals are as follows: Goal progressing for improving shortness of breath. Brandy Medina is currently able to maintain sats >88% on RA while exercising. She is currently exercising on the Treadmill and the Recumbant Elliptical. Goal progressing for developing more efficient  breathing techniques such as purse lipped breathing and diaphragmatic breathing; and practicing self-pacing with activity. We will continue to monitor Brandy Medina's progress throughout the program. Monthly review of patient's Core Components/Risk Factors/Patient Goals are as follows: Goal met for improving shortness of breath. Brandy Medina is currently able to maintain sats >88% on RA while exercising. She is currently exercising on the Treadmill and the Recumbent Elliptical. Throughout the program, Brandy Medina was able to increase her workload on the elliptical and METS and increase her speed and incline. Goal met for developing more efficient breathing techniques such as purse lipped breathing and diaphragmatic breathing; and practicing self-pacing with activity. Brandy Medina can initiate purse lipped breathing and practices diaphragmatic breathing at home.    Expected Outcomes For Brandy Medina to improve her shortness of breath with ADLs and develop more efficient breathing techniques such as purse  lipped breathing and diaphragmatic breathing; and practicing self-pacing with activity For Brandy Medina to improve her shortness of breath with ADLs and develop more efficient breathing techniques such as purse lipped breathing and diaphragmatic breathing; and practicing self-pacing with activity For Brandy Medina to improve her shortness of breath with ADLs and develop more efficient breathing techniques such as purse lipped breathing and diaphragmatic breathing; and practicing self-pacing with activity For Brandy Medina to continue to improve her shortness of breath after graduation from [facility]             Exercise Goals and Review:  Exercise Goals     Row Name 02/20/23 1045             Exercise Goals   Increase Physical Activity Yes       Intervention Provide advice, education, support and counseling about physical activity/exercise needs.;Develop an individualized exercise prescription for aerobic and resistive training based on initial  evaluation findings, risk stratification, comorbidities and participant's personal goals.       Expected Outcomes Short Term: Attend rehab on a regular basis to increase amount of physical activity.;Long Term: Exercising regularly at least 3-5 days a week.;Long Term: Add in home exercise to make exercise part of routine and to increase amount of physical activity.       Increase Strength and Stamina Yes       Intervention Provide advice, education, support and counseling about physical activity/exercise needs.;Develop an individualized exercise prescription for aerobic and resistive training based on initial evaluation findings, risk stratification, comorbidities and participant's personal goals.       Expected Outcomes Short Term: Increase workloads from initial exercise prescription for resistance, speed, and METs.;Short Term: Perform resistance training exercises routinely during rehab and add in resistance training at home;Long Term: Improve cardiorespiratory fitness, muscular endurance and strength as measured by increased METs and functional capacity ( )       Able to understand and use rate of perceived exertion (RPE) scale Yes       Intervention Provide education and explanation on how to use RPE scale       Expected Outcomes Short Term: Able to use RPE daily in rehab to express subjective intensity level;Long Term:  Able to use RPE to guide intensity level when exercising independently       Able to understand and use Dyspnea scale Yes       Intervention Provide education and explanation on how to use Dyspnea scale       Expected Outcomes Short Term: Able to use Dyspnea scale daily in rehab to express subjective sense of shortness of breath during exertion;Long Term: Able to use Dyspnea scale to guide intensity level when exercising independently       Knowledge and understanding of Target Heart Rate Range (THRR) Yes       Intervention Provide education and explanation of THRR including how  the numbers were predicted and where they are located for reference       Expected Outcomes Short Term: Able to state/look up THRR;Long Term: Able to use THRR to govern intensity when exercising independently;Short Term: Able to use daily as guideline for intensity in rehab       Understanding of Exercise Prescription Yes       Intervention Provide education, explanation, and written materials on patient's individual exercise prescription       Expected Outcomes Short Term: Able to explain program exercise prescription;Long Term: Able to explain home exercise prescription to exercise independently  Exercise Goals Re-Evaluation:  Exercise Goals Re-Evaluation     Row Name 03/09/23 1108 04/08/23 0843 05/04/23 0933 05/14/23 1525       Exercise Goal Re-Evaluation   Exercise Goals Review Increase Physical Activity;Able to understand and use Dyspnea scale;Understanding of Exercise Prescription;Increase Strength and Stamina;Knowledge and understanding of Target Heart Rate Range (THRR);Able to understand and use rate of perceived exertion (RPE) scale Increase Physical Activity;Able to understand and use Dyspnea scale;Understanding of Exercise Prescription;Increase Strength and Stamina;Knowledge and understanding of Target Heart Rate Range (THRR);Able to understand and use rate of perceived exertion (RPE) scale Increase Physical Activity;Able to understand and use Dyspnea scale;Understanding of Exercise Prescription;Increase Strength and Stamina;Knowledge and understanding of Target Heart Rate Range (THRR);Able to understand and use rate of perceived exertion (RPE) scale Increase Physical Activity;Able to understand and use Dyspnea scale;Understanding of Exercise Prescription;Increase Strength and Stamina;Knowledge and understanding of Target Heart Rate Range (THRR);Able to understand and use rate of perceived exertion (RPE) scale    Comments Brandy Medina has completed 2 exercise sessions. She  exercises for 15 min on the track and recumbent elliptical. Brandy Medina 3.0 METs on the track and 3.7 METs at level 1 on the recumbent elliptical. She performs the warmup and cooldown standing without limitations. It is too soon to notate any progressions. Brandy Medina is scheduled to be out of rehab for the next couple of weeks. Will continue to monitor and progress as able. Brandy Medina has completed 6 exercise sessions. She exercises for 15 min on the treadmill and recumbent elliptical. Brandy Medina averages 3.4 METs on the treadmill and 3.9 METs at level 3 on the recumbent elliptical. She performs the warmup and cooldown standing without limitations. Brandy Medina has increased her level several times on the recumbent elliptical as METs have increased. She has also transitioned from the track to the treadmill. She tolerates the level increases and treadmill well. Brandy Medina seems motivated to exercise and improve her functional capacity. Will continue to monitor and progress as able. Brandy Medina has completed 9 exercise sessions. She exercises for 15 min on the treadmill and recumbent elliptical. Brandy Medina averages 3.3 METs at 2.3 mph and 2% inclines on the treadmill and 3.3 METs at level 2 on the recumbent elliptical. She performs the warmup and cooldown standing without limitations. Brandy Medina has increased her speed and incline the treadmill as METs have increased. I encouraged her to increase her level on the recumbent elliptical. I am unsure why her level decreased. Will encourage her to increase her level. I have not discussed home exercise yet but plan to soon. Brandy Medina has completed 13 exercise sessions. Her peak METs were 3.2 on the treadmill and 3.9 on the recumbent elliptical. Pt will exercise with spouse at [facility] [employer].    Expected Outcomes Through exercise at rehab and home, the patient will decrease shortness of breath with daily activities and feel confident in carrying out an exercise regimen at home. Through exercise at rehab and  home, the patient will decrease shortness of breath with daily activities and feel confident in carrying out an exercise regimen at home. Through exercise at rehab and home, the patient will decrease shortness of breath with daily activities and feel confident in carrying out an exercise regimen at home. Through exercise at rehab and home, the patient will decrease shortness of breath with daily activities and feel confident in carrying out an exercise regimen at home.             Nutrition & Weight - Outcomes:  Pre Biometrics -  02/20/23 1215       Pre Biometrics   Grip Strength 19 kg              Nutrition:  Nutrition Therapy & Goals - 05/14/23 1536       Nutrition Therapy   Diet General Healthy Diet    Drug/Food Interactions Statins/Certain Fruits      Personal Nutrition Goals   Nutrition Goal Patient to improve diet quality by using the plate method as a guide for meal planning to include lean protein/plant protein, fruits, vegetables, whole grains, nonfat dairy as part of a well-balanced diet.    Comments Goals in progress. Brandy Medina has medical history of ILD, DM2, HTN, hypoglycemia. She started jardiance for elevated A1c in late January 2025 but has since stopped due to hypoglycemia, fatigue. Lipids WNL. She is down 6.6# since her orientation to our program. We have reviewed multiple strategies for blood sugar control including the 15 rule, eating frequency, benefits of high protein/high fiber eating patterns, etc. She will follow-up with endocrinology regarding hypoglycemia episodes. Patient will benefit from adherence to nutrition, exercise, and lifestyle modification.      Intervention Plan   Intervention Prescribe, educate and counsel regarding individualized specific dietary modifications aiming towards targeted core components such as weight, hypertension, lipid management, diabetes, heart failure and other comorbidities.;Nutrition handout(s) given to patient.    Expected  Outcomes Short Term Goal: Understand basic principles of dietary content, such as calories, fat, sodium, cholesterol and nutrients.;Long Term Goal: Adherence to prescribed nutrition plan.             Nutrition Discharge:   Education Questionnaire Score:  Knowledge Questionnaire Score - 05/12/23 0853       Knowledge Questionnaire Score   Post Score 18/18            Brandy Medina graduated the Pulmonary Rehab program on 05/14/23 completing 13 sessions. Post rehab PHQ 2 & 9 scores were 0 & 1. At time of discharge Brandy Medina continued to deny any psychosocial barriers or concerns. She declines any needs or referrals. Graduation review of patient's Core Components/Risk Factors/Patient Goals are as follows: Goal met for improving shortness of breath. Brandy Medina is currently able to maintain sats >88% on RA while exercising. She is currently exercising on the Treadmill and the Recumbent Elliptical. Throughout the program, Brandy Medina was able to increase her workload on the elliptical and METS and increase her speed and incline. Goal met for developing more efficient breathing techniques such as purse lipped breathing and diaphragmatic breathing; and practicing self-pacing with activity. Darnelle can initiate purse lipped breathing and practices diaphragmatic breathing at home.   Goals reviewed with patient; copy given to patient.

## 2023-05-27 ENCOUNTER — Other Ambulatory Visit: Payer: Self-pay

## 2023-05-27 MED ORDER — GLUCOSE BLOOD VI STRP: ORAL_STRIP | 12 refills | Status: AC

## 2023-06-25 ENCOUNTER — Ambulatory Visit
Admission: RE | Admit: 2023-06-25 | Discharge: 2023-06-25 | Disposition: A | Discharge status: Home / Self Care | Source: Ambulatory Visit | Attending: Emergency Medicine | Admitting: Emergency Medicine

## 2023-06-25 ENCOUNTER — Ambulatory Visit: Payer: Self-pay | Admitting: Internal Medicine

## 2023-06-25 ENCOUNTER — Telehealth: Payer: Self-pay | Admitting: Neurology

## 2023-06-25 VITALS — BP 162/85 | HR 82 | Temp 97.9°F | Resp 17

## 2023-06-25 DIAGNOSIS — M51362 Other intervertebral disc degeneration, lumbar region with discogenic back pain and lower extremity pain: Secondary | ICD-10-CM | POA: Diagnosis not present

## 2023-06-25 DIAGNOSIS — M5431 Sciatica, right side: Secondary | ICD-10-CM | POA: Diagnosis not present

## 2023-06-25 MED ORDER — PREDNISONE 20 MG PO TABS: 40.0000 mg | ORAL_TABLET | Freq: Every day | ORAL | 0 refills | Status: AC

## 2023-06-25 MED ORDER — DEXAMETHASONE SODIUM PHOSPHATE 10 MG/ML IJ SOLN
10.0000 mg | Freq: Once | INTRAMUSCULAR | Status: AC
  Administered 2023-06-25: 10 mg via INTRAMUSCULAR

## 2023-06-25 NOTE — Telephone Encounter (Signed)
 Called patient and informed her of Dr. Basilio Both recommendations and advice below. Patient stated that the feeling is a stabbing pain. I informed patient to contact her PCP in regards to this so they may evaluate her and see what is going on. Patient verbalized understanding and will contact her PCP today.

## 2023-06-25 NOTE — ED Triage Notes (Signed)
 Pt c/o right leg pain that starts in hip and goes down to knee.

## 2023-06-25 NOTE — ED Provider Notes (Signed)
 [facility]    CSN: [id number] Arrival date & time: 06/25/23  1519      History   Chief Complaint Chief Complaint  Patient presents with   Leg Pain    Entered by patient    HPI Brandy Medina is a 73 y.o. female.   Patient presents to clinic over concern of right-sided low back pain that radiates down her right thigh.  This has been present for the past 5 days, but got much worse last night and was unable to sleep due to the pain.  Has not any rashes or skin changes.  Does have a history of degenerative disc disease and is unsure if this is playing a role.  She has taken Tylenol  arthritis, ice to the area and tried ibuprofen, which she knows to avoid due to her stage II kidney disease, but she was trying to find some relief.  Pain with changing positions and with ambulating in the right hip area that radiates down her right leg.  Has not had any other leg numbness or incontinence.  Denies any trauma or falls.  A1C in April was 6.5, type 2 diabetes appears well-controlled without medication.  Has had issues with hypoglycemia and will follow-up with endocrinology.  The history is provided by the patient and medical records.  Leg Pain   Past Medical History:  Diagnosis Date   Allergic rhinitis    Diabetes (HCC)    HTN (hypertension)    Migraines    PONV (postoperative nausea and vomiting)    Pulmonary fibrosis (HCC)     Patient Active Problem List   Diagnosis Date Noted   Hyperpigmentation 05/10/2023   Hair thinning 05/10/2023   DM cataract (HCC) 05/10/2023   Renal insufficiency 05/10/2023   Herpes zoster without complication 03/04/2023   Aortic atherosclerosis (HCC) 02/15/2023   Paresthesia of lower extremity 02/15/2023   Pulmonary fibrosis (HCC) 02/15/2023   Estrogen deficiency 02/03/2023   Chronic rhinitis 02/05/2022   Recurrent infections 02/05/2022   Dietary counseling and surveillance 02/05/2022   Anemia of infection and chronic disease 07/23/2021    Normocytic anemia 04/17/2020   ILD (interstitial lung disease) (HCC) 02/27/2020   Sjogren's syndrome (HCC) 02/27/2020   Abnormal findings on diagnostic imaging of lung 02/27/2020   Abnormal ECG 12/12/2015   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   Essential hypertension 07/07/2014   Upper airway cough syndrome 07/06/2014   Obstructive bronchiectasis (HCC) 06/20/2009   Nonspecific (abnormal) findings on radiological and other examination of body structure 08/02/2008   COMPUTERIZED TOMOGRAPHY, CHEST, ABNORMAL 08/02/2008   Hypoglycemia 08/01/2008   MIGRAINE HEADACHE 08/01/2008   Sinusitis, chronic 08/01/2008   GERD 08/01/2008   ABDOMINAL PAIN, CHRONIC 08/01/2008   ANEMIA, IRON DEFICIENCY, HX OF 08/01/2008   DIVERTICULOSIS, COLON, HX OF 08/01/2008    Past Surgical History:  Procedure Laterality Date   ABDOMINAL HYSTERECTOMY  1989   partial   BIOPSY  06/01/2020   Procedure: BIOPSY;  Surgeon: Genell Ken, MD;  Location: [facility];  Service: Gastroenterology;;  EGD and COLON   BIOPSY  06/04/2021   Procedure: BIOPSY;  Surgeon: Genell Ken, MD;  Location: [facility];  Service: Gastroenterology;;   BREAST SURGERY     COLONOSCOPY WITH PROPOFOL  N/A 06/01/2020   Procedure: COLONOSCOPY WITH PROPOFOL ;  Surgeon: Genell Ken, MD;  Location: [facility];  Service: Gastroenterology;  Laterality: N/A;   ESOPHAGOGASTRODUODENOSCOPY N/A 06/04/2021   Procedure: ESOPHAGOGASTRODUODENOSCOPY (EGD);  Surgeon: Genell Ken, MD;  Location: [facility];  Service: Gastroenterology;  Laterality: N/A;   ESOPHAGOGASTRODUODENOSCOPY (EGD) WITH PROPOFOL  N/A 06/01/2020   Procedure: ESOPHAGOGASTRODUODENOSCOPY (EGD) WITH PROPOFOL ;  Surgeon: Genell Ken, MD;  Location: [facility];  Service: Gastroenterology;  Laterality: N/A;   POLYPECTOMY  06/01/2020   Procedure: POLYPECTOMY;  Surgeon: Genell Ken, MD;  Location: [facility];  Service: Gastroenterology;;    SALPINGOOPHORECTOMY Left     OB History   No obstetric history on file.      Home Medications    Prior to Admission medications   Medication Sig Start Date End Date Taking? Authorizing Provider  predniSONE  (DELTASONE ) 20 MG tablet Take 2 tablets (40 mg total) by mouth daily with breakfast for 5 days. 06/25/23 06/30/23 Yes Zayne Marovich  N, FNP  acetaminophen  (TYLENOL ) 500 MG tablet Take 500-1,000 mg by mouth every 6 (six) hours as needed for moderate pain or headache.    [provider]  amLODipine  (NORVASC ) 5 MG tablet Take 1 tablet (5 mg total) by mouth daily. May take an additional 2.5 mg tablet if systolic blood pressure 160 if needed. Patient taking differently: Take 2.5 mg by mouth daily. 06/20/22   Carie Charity, NP  azithromycin  (ZITHROMAX ) 250 MG tablet Take 1 tablet (250 mg total) by mouth daily. Take first 2 tablets together, then 1 every day until finished. Patient not taking: Reported on 05/05/2023 04/20/23   Vernestine Gondola, PA-C  butalbital-acetaminophen -caffeine (FIORICET) 50-325-40 MG tablet Take 1 tablet by mouth 2 (two) times daily as needed for migraine.    [provider]  chlorpheniramine (CHLOR-TRIMETON) 4 MG tablet Take 4 mg by mouth every 4 (four) hours as needed for allergies.    [provider]  Cholecalciferol (VITAMIN D) 50 MCG (2000 UT) tablet Take 1,000 Units by mouth daily. Tues and Thurs    [provider]  dextromethorphan (DELSYM) 30 MG/5ML liquid Take 30 mg by mouth 2 (two) times daily as needed for cough.    [provider]  epoetin  alfa (EPOGEN ) 10000 UNIT/ML injection as directed Injection once every 3 weeks for 3 months    [provider]  glucose blood test strip Use as instructed 05/27/23   Cleave Curling, MD  Homeopathic Products (ARNICARE PAIN RELIEF EX) Apply 1 application. topically daily as needed (pain).    [provider]  Respiratory Therapy Supplies (FLUTTER) DEVI Use as  directed. 06/29/19   Jenni Mody, NP  rosuvastatin  (CRESTOR ) 5 MG tablet Take 1 tablet (5 mg total) by mouth every Tuesday. 02/03/23   Cleave Curling, MD  valsartan  (DIOVAN ) 320 MG tablet Take 1 tablet (320 mg total) by mouth daily. 02/03/23   Cleave Curling, MD  Wheat Dextrin (BENEFIBER PO) Take 1 Scoop by mouth daily.    [provider]    Family History Family History  Problem Relation Age of Onset   Renal Disease Brother    Stroke Brother    Bronchitis Mother    Heart disease Mother    Heart disease Father    Heart attack Father    Hypertension Sister    Diabetes Sister    Heart attack Brother    Hypertension Sister    Diabetes Sister     Social History Social History   Tobacco Use   Smoking status: Former    Current packs/day: 0.00    Average packs/day: 0.5 packs/day for 18.0 years (9.0 ttl pk-yrs)    Types: Cigarettes    Start date: 07/06/1975    Quit date: 07/05/1993  Years since quitting: 29.9   Smokeless tobacco: Never  Substance Use Topics   Alcohol use: Yes    Comment: Occasional Wine or Martini   Drug use: Never     Allergies   Avelox [moxifloxacin hcl in nacl], Avelox [moxifloxacin], Azathioprine , Codeine, Erythromycin, Fexofenadine, Levocetirizine, Lisinopril, Sulfa antibiotics, Sulfonamide derivatives, and Zyrtec [cetirizine]   Review of Systems Review of Systems  Per HPI  Physical Exam Triage Vital Signs ED Triage Vitals [06/25/23 1603]  Encounter Vitals Group     BP      Systolic BP Percentile      Diastolic BP Percentile      Pulse      Resp      Temp      Temp src      SpO2      Weight      Height      Head Circumference      Peak Flow      Pain Score 7     Pain Loc      Pain Education      Exclude from Growth Chart    No data found.  Updated Vital Signs BP (!) 162/85 (BP Location: Left Arm)   Pulse 82   Temp 97.9 F (36.6 C) (Oral)   Resp 17   SpO2 99%   Visual Acuity Right Eye Distance:   Left Eye Distance:    Bilateral Distance:    Right Eye Near:   Left Eye Near:    Bilateral Near:     Physical Exam Vitals and nursing note reviewed.  Constitutional:      Appearance: Normal appearance.  HENT:     Head: Normocephalic and atraumatic.     Right Ear: External ear normal.     Left Ear: External ear normal.     Nose: Nose normal.     Mouth/Throat:     Mouth: Mucous membranes are moist.  Eyes:     Conjunctiva/sclera: Conjunctivae normal.  Cardiovascular:     Rate and Rhythm: Normal rate.  Pulmonary:     Effort: Pulmonary effort is normal. No respiratory distress.  Musculoskeletal:        General: Tenderness present. No swelling or signs of injury.     Lumbar back: Positive right straight leg raise test.       Back:     Comments: Mild right-sided lumbar/gluteal tenderness to palpation.  No overlying skin changes.  Skin:    General: Skin is warm and dry.  Neurological:     General: No focal deficit present.     Mental Status: She is alert.  Psychiatric:        Mood and Affect: Mood normal.        Behavior: Behavior is cooperative.      UC Treatments / Results  Labs (all labs ordered are listed, but only abnormal results are displayed) Labs Reviewed - No data to display  EKG   Radiology No results found.  Procedures Procedures (including critical care time)  Medications Ordered in UC Medications  dexamethasone (DECADRON) injection 10 mg (has no administration in time range)    Initial Impression / Assessment and Plan / UC Course  I have reviewed the triage vital signs and the nursing notes.  Pertinent labs & imaging results that were available during my care of the patient were reviewed by me and considered in my medical decision making (see chart for details).  Vitals in triage reviewed, patient is hemodynamically stable.  Atraumatic low back pain that radiates down into her right thigh concerning for sciatica.  Imaging deferred at this time due to no trauma or  acute changes.  Does have history of DDD.  Will treat with IM steroid and oral steroid taper.  Encouraged orthopedic and physical therapy follow-up.   Plan of care, follow-up care return precautions given, no questions at this time.      Final Clinical Impressions(s) / UC Diagnoses   Final diagnoses:  Degeneration of intervertebral disc of lumbar region with discogenic back pain and lower extremity pain  Sciatica of right side     Discharge Instructions      We have given you a steroid today in clinic that will help reduce the pain and inflammation.  Starting tomorrow you can take the prednisone  with breakfast.  Continue to take Tylenol  arthritis as needed.  If this pain returns or persist please follow-up with an orthopedic, as they can perform further advanced evaluation.   ED Prescriptions     Medication Sig Dispense Auth. Provider   predniSONE  (DELTASONE ) 20 MG tablet Take 2 tablets (40 mg total) by mouth daily with breakfast for 5 days. 10 tablet Harlow Lighter, Maricela Kawahara  N, FNP      PDMP not reviewed this encounter.   Harlow Lighter, Maie Kesinger  N, FNP 06/25/23 [phone]

## 2023-06-25 NOTE — Telephone Encounter (Signed)
 Pt is having pain in thigh and would like an Rx as over the counter meds and ice pack are not stopping the pain, please call back

## 2023-06-25 NOTE — Telephone Encounter (Signed)
 Chief Complaint: Right leg pain since Monday  Symptoms: "Sharp" pain last night on right thigh and inner thigh going towards knee Frequency: Ongoing issue, started back this week  Disposition: [x] Urgent Care (no appt availability in office)   Additional Notes: Patient scheduled for an appointment today at urgent care as no appointment availability in office.    Copied from CRM [id number]. Topic: Clinical - Red Word Triage >> Jun 25, 2023  2:00 PM Donald Frost wrote: Red Word that prompted transfer to Nurse Triage: The patient called in stating she has been having right side pain as well as right hip, thigh and above the right knee pain. She says it was a stabbing pain right above the knee last night. She has tried Advil, Advil Duel, Tylenol  Arthritis, Gabapentin  which caused headaches, ice packs and absorb ine jr. I will transfer her to E2C2 NT. She states I need relief as she has a wedding party to attend to at her house this weekend. Reason for Disposition  [1] SEVERE pain (e.g., excruciating, unable to do any normal activities) AND [2] not improved after 2 hours of pain medicine  Answer Assessment - Initial Assessment Questions ONSET: "When did the pain start?"      Monday LOCATION: "Where is the pain located?"      Right thigh PAIN: "How bad is the pain?"    (Scale 1-10; or mild, moderate, severe)   -  MILD (1-3): doesn't interfere with normal activities    -  MODERATE (4-7): interferes with normal activities (e.g., work or school) or awakens from sleep, limping    -  SEVERE (8-10): excruciating pain, unable to do any normal activities, unable to walk     10/10 last night and now 6-7/10 pain level, "not pain now an ache" CAUSE: "What do you think is causing the leg pain?"     Pt thought it was nerve pain  Protocols used: Leg Pain-A-AH

## 2023-06-25 NOTE — Telephone Encounter (Signed)
 I have not evaluated her for thigh pain. If she is having numbness/tingling in the thigh, she would need an appointment - OK to offer work-in on 6/4, or she can see her PCP if her pain is more achy/throbbing.

## 2023-06-25 NOTE — Discharge Instructions (Addendum)
 We have given you a steroid today in clinic that will help reduce the pain and inflammation.  Starting tomorrow you can take the prednisone  with breakfast.  Continue to take Tylenol  arthritis as needed.  If this pain returns or persist please follow-up with an orthopedic, as they can perform further advanced evaluation.

## 2023-06-26 ENCOUNTER — Telehealth: Payer: Self-pay | Admitting: Internal Medicine

## 2023-06-26 NOTE — Telephone Encounter (Unsigned)
 Copied from CRM [id number]. Topic: Clinical - Medication Refill >> Jun 26, 2023  9:33 AM Loreda Rodriguez T wrote: Medication:  amLODipine  (NORVASC ) 5 MG tablet     Has the patient contacted their pharmacy? No  This is the patient's preferred pharmacy:   [facility] STORE #[id number] [place], [place] - [address] AT [facility] [address] [place] [address] Phone: [phone] Fax: [phone]   Is this the correct pharmacy for this prescription? Yes  Has the prescription been filled recently? Yes  Is the patient out of the medication? Yes  Has the patient been seen for an appointment in the last year OR does the patient have an upcoming appointment? Yes  Can we respond through MyChart? No  Agent: Please be advised that Rx refills may take up to 3 business days. We ask that you follow-up with your pharmacy.

## 2023-06-30 ENCOUNTER — Ambulatory Visit: Admitting: Family Medicine

## 2023-06-30 ENCOUNTER — Encounter: Payer: Self-pay | Admitting: Family Medicine

## 2023-06-30 ENCOUNTER — Ambulatory Visit
Admission: RE | Admit: 2023-06-30 | Discharge: 2023-06-30 | Disposition: A | Discharge status: Home / Self Care | Source: Ambulatory Visit | Attending: Family Medicine | Admitting: Family Medicine

## 2023-06-30 VITALS — BP 122/84 | Ht 64.0 in | Wt 155.0 lb

## 2023-06-30 DIAGNOSIS — M51362 Other intervertebral disc degeneration, lumbar region with discogenic back pain and lower extremity pain: Secondary | ICD-10-CM

## 2023-06-30 DIAGNOSIS — M4726 Other spondylosis with radiculopathy, lumbar region: Secondary | ICD-10-CM | POA: Diagnosis not present

## 2023-06-30 MED ORDER — MELOXICAM 15 MG PO TABS
ORAL_TABLET | ORAL | 2 refills | Status: DC
Start: 2023-06-30 — End: 2023-08-03

## 2023-06-30 MED ORDER — PREDNISONE 10 MG PO TABS: ORAL_TABLET | ORAL | 0 refills | Status: DC

## 2023-06-30 NOTE — Progress Notes (Signed)
 PCP: Cleave Curling, MD  Chief Complaint: right leg pain Subjective:   HPI: Patient is a 73 y.o. female here for right sided leg pain.  Patient notes pain that starts in her back and travels down her right leg into the anterior thigh.  Patient states that the pain in the thigh appears to be the most concerning at this time as it is a numbness tingling and pins and needlelike sensation.  Patient denies any leg weakness otherwise.  No groin pain noted.  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  Patient tried gabapentin  but could not tolerate due to headaches.  Patient has no foot drop or any other concerns like that at this time.    Past Medical History:  Diagnosis Date   Allergic rhinitis    Diabetes (HCC)    HTN (hypertension)    Migraines    PONV (postoperative nausea and vomiting)    Pulmonary fibrosis (HCC)     Current Outpatient Medications on File Prior to Visit  Medication Sig Dispense Refill   acetaminophen  (TYLENOL ) 500 MG tablet Take 500-1,000 mg by mouth every 6 (six) hours as needed for moderate pain or headache.     amLODipine  (NORVASC ) 5 MG tablet Take 1 tablet (5 mg total) by mouth daily. May take an additional 2.5 mg tablet if systolic blood pressure 160 if needed. (Patient taking differently: Take 2.5 mg by mouth daily.) 135 tablet 3   azithromycin  (ZITHROMAX ) 250 MG tablet Take 1 tablet (250 mg total) by mouth daily. Take first 2 tablets together, then 1 every day until finished. (Patient not taking: Reported on 05/05/2023) 6 tablet 0   butalbital-acetaminophen -caffeine (FIORICET) 50-325-40 MG tablet Take 1 tablet by mouth 2 (two) times daily as needed for migraine.     chlorpheniramine (CHLOR-TRIMETON) 4 MG tablet Take 4 mg by mouth every 4 (four) hours as needed for allergies.     Cholecalciferol (VITAMIN D) 50 MCG (2000 UT) tablet Take 1,000 Units by mouth daily. Tues and Thurs     dextromethorphan  (DELSYM) 30 MG/5ML liquid Take 30 mg by mouth 2 (two) times daily as needed for cough.     epoetin  alfa (EPOGEN ) 10000 UNIT/ML injection as directed Injection once every 3 weeks for 3 months     glucose blood test strip Use as instructed 100 each 12   Homeopathic Products (ARNICARE PAIN RELIEF EX) Apply 1 application. topically daily as needed (pain).     predniSONE  (DELTASONE ) 20 MG tablet Take 2 tablets (40 mg total) by mouth daily with breakfast for 5 days. 10 tablet 0   Respiratory Therapy Supplies (FLUTTER) DEVI Use as directed. 1 each 0   rosuvastatin  (CRESTOR ) 5 MG tablet Take 1 tablet (5 mg total) by mouth every Tuesday. 12 tablet 3   valsartan  (DIOVAN ) 320 MG tablet Take 1 tablet (320 mg total) by mouth daily. 90 tablet 2   Wheat Dextrin (BENEFIBER PO) Take 1 Scoop by mouth daily.     No current facility-administered medications on file prior to visit.    Past Surgical History:  Procedure Laterality Date   ABDOMINAL HYSTERECTOMY  1989   partial   BIOPSY  06/01/2020   Procedure: BIOPSY;  Surgeon: Genell Ken, MD;  Location: [facility];  Service: Gastroenterology;;  EGD and COLON   BIOPSY  06/04/2021   Procedure: BIOPSY;  Surgeon: Genell Ken, MD;  Location: [facility];  Service: Gastroenterology;;   BREAST  SURGERY     COLONOSCOPY WITH PROPOFOL  N/A 06/01/2020   Procedure: COLONOSCOPY WITH PROPOFOL ;  Surgeon: Genell Ken, MD;  Location: [facility];  Service: Gastroenterology;  Laterality: N/A;   ESOPHAGOGASTRODUODENOSCOPY N/A 06/04/2021   Procedure: ESOPHAGOGASTRODUODENOSCOPY (EGD);  Surgeon: Genell Ken, MD;  Location: [facility];  Service: Gastroenterology;  Laterality: N/A;   ESOPHAGOGASTRODUODENOSCOPY (EGD) WITH PROPOFOL  N/A 06/01/2020   Procedure: ESOPHAGOGASTRODUODENOSCOPY (EGD) WITH PROPOFOL ;  Surgeon: Genell Ken, MD;  Location: [facility];  Service: Gastroenterology;  Laterality: N/A;   POLYPECTOMY  06/01/2020   Procedure: POLYPECTOMY;  Surgeon: Genell Ken, MD;   Location: [facility];  Service: Gastroenterology;;   SALPINGOOPHORECTOMY Left     Allergies  Allergen Reactions   Avelox [Moxifloxacin Hcl In Nacl] Other (See Comments)    confusion   Avelox [Moxifloxacin] Other (See Comments)   Azathioprine  Other (See Comments)   Codeine Nausea Only and Other (See Comments)   Erythromycin     GI upset   Fexofenadine Other (See Comments)   Levocetirizine Other (See Comments)   Lisinopril     Headache   Sulfa Antibiotics Other (See Comments)   Sulfonamide Derivatives     headache   Zyrtec [Cetirizine] Other (See Comments)    BP 122/84   Ht 5\' 4"  (1.626 m)   Wt 155 lb (70.3 kg)   BMI 26.61 kg/m       No data to display              No data to display              Objective:  Physical Exam:  Gen: NAD, comfortable in exam room  Lumbar spine shows no acute abnormality, there is minimal tenderness to palpation over the lumbar spine or the glued area.  Straight leg test is negative.  Patient is able to bend and flex without difficulty.  No neurological deficit of the lower extremities noted   Assessment & Plan:  1. 1. Degeneration of intervertebral disc of lumbar region with discogenic back pain and lower extremity pain (Primary) - I had a discussion with patient regarding her symptoms, given that patient has had mild relief with the prednisone , will extend the prednisone  and include a taper. - Will also give patient meloxicam but only advised patient to take if she feels like the taper is not helping her.  At that time patient can take the meloxicam as well.  I did advise patient to only take 7 days worth given her history of CKD. - Patient's back pain does appear to be acute on chronic and discogenic in nature and would benefit from an MRI of the lumbar spine.  Will go ahead and order this at this time as well as get an x-ray to evaluate for any degenerative changes.  Patient follow-up in approximately 6 weeks depending on  improvement - DG Lumbar Spine 2-3 Views; Future - MR Lumbar Spine Wo Contrast; Future    Jude Norton MD, PGY-4  Sports Medicine Fellow [facility]

## 2023-06-30 NOTE — Addendum Note (Signed)
 Addended by: Rodgers Clack on: 06/30/2023 05:48 PM   Modules accepted: Level of Service

## 2023-06-30 NOTE — Patient Instructions (Signed)
 Only start the mobic if the steroids are not helping you.   Follow-up in 6 weeks

## 2023-07-01 ENCOUNTER — Other Ambulatory Visit: Payer: Self-pay | Admitting: Internal Medicine

## 2023-07-01 NOTE — Telephone Encounter (Signed)
 Copied from CRM [id number]. Topic: Clinical - Medication Refill >> Jul 01, 2023 12:40 PM Lesta Rater S wrote: Medication: amLODipine  (NORVASC ) 5 MG tablet   Has the patient contacted their pharmacy? Yes (Agent: If no, request that the patient contact the pharmacy for the refill. If patient does not wish to contact the pharmacy document the reason why and proceed with request.) (Agent: If yes, when and what did the pharmacy advise?) Haven't received anything  This is the patient's preferred pharmacy:  [facility] #[id number] [place] Nestle, [place] - [address] AT [facility] [address] [place] [address] Phone: [phone] Fax: [phone]   Is this the correct pharmacy for this prescription? Yes If no, delete pharmacy and type the correct one.   Has the prescription been filled recently? Yes  Is the patient out of the medication? Yes. Medication expired  Has the patient been seen for an appointment in the last year OR does the patient have an upcoming appointment? Yes  Can we respond through MyChart? Yes  Agent: Please be advised that Rx refills may take up to 3 business days. We ask that you follow-up with your pharmacy.

## 2023-07-02 ENCOUNTER — Other Ambulatory Visit: Payer: Self-pay

## 2023-07-02 MED ORDER — AMLODIPINE BESYLATE 5 MG PO TABS: ORAL_TABLET | ORAL | 2 refills | Status: DC

## 2023-07-02 MED ORDER — AMLODIPINE BESYLATE 2.5 MG PO TABS: 2.5000 mg | ORAL_TABLET | Freq: Every day | ORAL | 1 refills | Status: DC

## 2023-07-11 ENCOUNTER — Ambulatory Visit
Admission: RE | Admit: 2023-07-11 | Discharge: 2023-07-11 | Disposition: A | Discharge status: Home / Self Care | Source: Ambulatory Visit | Attending: Family Medicine | Admitting: Family Medicine

## 2023-07-11 DIAGNOSIS — M47816 Spondylosis without myelopathy or radiculopathy, lumbar region: Secondary | ICD-10-CM | POA: Diagnosis not present

## 2023-07-11 DIAGNOSIS — M51362 Other intervertebral disc degeneration, lumbar region with discogenic back pain and lower extremity pain: Secondary | ICD-10-CM

## 2023-07-11 DIAGNOSIS — M5126 Other intervertebral disc displacement, lumbar region: Secondary | ICD-10-CM | POA: Diagnosis not present

## 2023-07-20 ENCOUNTER — Other Ambulatory Visit: Payer: Self-pay | Admitting: Internal Medicine

## 2023-07-20 NOTE — Telephone Encounter (Unsigned)
 Copied from CRM [id number]. Topic: Clinical - Medication Refill >> Jul 20, 2023  1:12 PM Donee H wrote: Medication: butalbital-acetaminophen -caffeine (FIORICET) 50-325-40 MG tablet  Has the patient contacted their pharmacy? Yes (Agent: If no, request that the patient contact the pharmacy for the refill. If patient does not wish to contact the pharmacy document the reason why and proceed with request.) (Agent: If yes, when and what did the pharmacy advise?)  This is the patient's preferred pharmacy:  [facility] STORE #[id number] [place], [place] - [address] AT [facility] [address] [place] [address] Phone: [phone] Fax: [phone]  Is this the correct pharmacy for this prescription? Yes If no, delete pharmacy and type the correct one.   Has the prescription been filled recently? No  Is the patient out of the medication? Yes  Has the patient been seen for an appointment in the last year OR does the patient have an upcoming appointment? Yes  Can we respond through MyChart? Yes  Agent: Please be advised that Rx refills may take up to 3 business days. We ask that you follow-up with your pharmacy.

## 2023-07-21 DIAGNOSIS — Z9189 Other specified personal risk factors, not elsewhere classified: Secondary | ICD-10-CM | POA: Diagnosis not present

## 2023-07-21 DIAGNOSIS — Z01419 Encounter for gynecological examination (general) (routine) without abnormal findings: Secondary | ICD-10-CM | POA: Diagnosis not present

## 2023-07-23 ENCOUNTER — Ambulatory Visit: Payer: Self-pay | Admitting: Family Medicine

## 2023-07-30 ENCOUNTER — Other Ambulatory Visit: Payer: Self-pay | Admitting: Internal Medicine

## 2023-07-30 ENCOUNTER — Other Ambulatory Visit: Payer: Self-pay

## 2023-07-30 MED ORDER — BUTALBITAL-APAP-CAFFEINE 50-325-40 MG PO TABS: 1.0000 | ORAL_TABLET | Freq: Four times a day (QID) | ORAL | 0 refills | Status: DC | PRN

## 2023-08-03 ENCOUNTER — Encounter: Payer: Self-pay | Admitting: Family Medicine

## 2023-08-03 ENCOUNTER — Ambulatory Visit: Admitting: Family Medicine

## 2023-08-03 ENCOUNTER — Ambulatory Visit: Payer: Medicare PPO | Admitting: Neurology

## 2023-08-03 VITALS — BP 126/72 | Ht 64.0 in | Wt 158.0 lb

## 2023-08-03 VITALS — BP 120/74 | HR 97 | Ht 64.0 in | Wt 161.0 lb

## 2023-08-03 DIAGNOSIS — N289 Disorder of kidney and ureter, unspecified: Secondary | ICD-10-CM | POA: Diagnosis not present

## 2023-08-03 DIAGNOSIS — M5416 Radiculopathy, lumbar region: Secondary | ICD-10-CM | POA: Diagnosis not present

## 2023-08-03 DIAGNOSIS — M51362 Other intervertebral disc degeneration, lumbar region with discogenic back pain and lower extremity pain: Secondary | ICD-10-CM | POA: Diagnosis not present

## 2023-08-03 DIAGNOSIS — G5603 Carpal tunnel syndrome, bilateral upper limbs: Secondary | ICD-10-CM

## 2023-08-03 DIAGNOSIS — G629 Polyneuropathy, unspecified: Secondary | ICD-10-CM

## 2023-08-03 NOTE — Progress Notes (Signed)
 Follow-up Visit   Date: 08/03/2023    Brandy Medina MRN: [id number] DOB: 12-01-1950    Brandy Medina is a 73 y.o. right-handed female with  Sjogren's syndrome, idiopathic pulmonary fibrosis, hypertension, hyperlipidemia, and diet-controlled diabetes returning to the clinic for follow-up of bilateral hand and feet numbness/tingling.  The patient was accompanied to the clinic by self.    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  She did not tolerate gabapentin  (headache).  She denies associated pain, so have opted to stay off any medication for now.   Bilateral carpal tunnel syndrome, worse on right.  Symptoms improved with using wrist splints.  Multilevel lumbosacral degenerative changes with disc protrusion causing right foraminal stenosis at L2. MRI lumbar spine was personally viewed. She may have been symptomatic from this when she first had right thigh pain.  She denies ongoing radicular pain.  She has completed PT for low back strengthening and has follow-up with orthopeadics today.   Return to clinic in 6 months  --------------------------------------------- History of present illness: Starting around summer 2023, she began having sensation as if she is stepping on stones/sticks involving the balls of the feet and toes.  There is no associated pain.  Symptoms are slightly worse on the right.  She also complains of intermittent numbness of the fingertips. Symptoms are improved if she wears a wrist brace.  This occurs infrequently, possibly once every few months.  Repositioning and stretching helps alleviate it.  She denies weakness of the arms and legs.  Prior smoker.  Socially drinks alcohol.  She lives at home with husband.  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  Diabetes is diet controlled.   UPDATE 10/09/2022:  She is here for EDX of the hands and right leg.  She continues to have abnormal sensation over the feet and  numbness in the hands.  Numbness is improved when she wear a brace.  No new complaints.   UPDATE 03/23/2023:  She is here for follow-up visit.  She has been compliant with using wrist braces, which has helped her hand tingling.  For her feet numbness/tingling, her PCP offered gabapentin , but she stopped this after developing headaches on it. Tingling in the feet is constant.   In late January, she had shingles involving the right thigh which has healed. She did not develop pain.   UPDATE 08/03/2023:  She is here for follow-up visit.  She continues to have sensation of pebbles and tingling in the feet.  Skin biopsy returned positive for small fiber neuropathy.  She denies pain. In June, she was having right thigh shooting pain and had MRI lumbar spine which shows multilevel degenerative changes with disc protrusion causing right foraminal stenosis at L2.  She is seeing orthopaedics for this.  She no longer had radiating pain in the right leg.  She endorse low back stiffness and occasional imbalance. She has completed PT.  She also reports intermittent tinnitus and pulsation at the left carotid artery.     Medications:  Current Outpatient Medications on File Prior to Visit  Medication Sig Dispense Refill   acetaminophen  (TYLENOL ) 500 MG tablet Take 500-1,000 mg by mouth every 6 (six) hours as needed for moderate pain or headache.     amLODipine  (NORVASC ) 2.5 MG tablet Take 1 tablet (2.5 mg total) by mouth daily. 90 tablet 1   butalbital -acetaminophen -caffeine  (FIORICET) 50-325-40 MG tablet Take 1 tablet by mouth every 6 (six) hours as needed for  migraine. 14 tablet 0   chlorpheniramine (CHLOR-TRIMETON) 4 MG tablet Take 4 mg by mouth every 4 (four) hours as needed for allergies.     Cholecalciferol (VITAMIN D) 50 MCG (2000 UT) tablet Take 1,000 Units by mouth daily. Tues and Thurs     dextromethorphan (DELSYM) 30 MG/5ML liquid Take 30 mg by mouth 2 (two) times daily as needed for cough.     epoetin  alfa  (EPOGEN ) 10000 UNIT/ML injection as directed Injection once every 3 weeks for 3 months     glucose blood test strip Use as instructed 100 each 12   Homeopathic Products (ARNICARE PAIN RELIEF EX) Apply 1 application. topically daily as needed (pain).     meloxicam  (MOBIC ) 15 MG tablet Take one pill a day with food for 7 days and then prn thereafter 40 tablet 2   Respiratory Therapy Supplies (FLUTTER) DEVI Use as directed. 1 each 0   rosuvastatin  (CRESTOR ) 5 MG tablet Take 1 tablet (5 mg total) by mouth every Tuesday. 12 tablet 3   valsartan  (DIOVAN ) 320 MG tablet Take 1 tablet (320 mg total) by mouth daily. 90 tablet 2   Wheat Dextrin (BENEFIBER PO) Take 1 Scoop by mouth daily.     amLODipine  (NORVASC ) 5 MG tablet May take an additional 2.5 mg tablet if systolic blood pressure 160 if needed. (Patient not taking: Reported on 08/03/2023) 90 tablet 2   predniSONE  (DELTASONE ) 10 MG tablet Take as directed per MD instructions (Patient not taking: Reported on 08/03/2023) 21 tablet 0   No current facility-administered medications on file prior to visit.    Allergies:  Allergies  Allergen Reactions   Avelox [Moxifloxacin Hcl In Nacl] Other (See Comments)    confusion   Avelox [Moxifloxacin] Other (See Comments)   Azathioprine  Other (See Comments)   Codeine Nausea Only and Other (See Comments)   Erythromycin     GI upset   Fexofenadine Other (See Comments)   Levocetirizine Other (See Comments)   Lisinopril     Headache   Sulfa Antibiotics Other (See Comments)   Sulfonamide Derivatives     headache   Zyrtec [Cetirizine] Other (See Comments)    Vital Signs:  BP 120/74   Pulse 97   Ht 5' 4 (1.626 m)   Wt 161 lb (73 kg)   SpO2 98%   BMI 27.64 kg/m    Neurological Exam: MENTAL STATUS including orientation to time, place, person, recent and remote memory, attention span and concentration, language, and fund of knowledge is normal.  Speech is not dysarthric.  CRANIAL NERVES:  Pupils equal  round and reactive to light.  Normal conjugate, extra-ocular eye movements in all directions of gaze.  No ptosis. Normal facial sensation.  Face is symmetric. Palate elevates symmetrically.  Tongue is midline.  MOTOR:  Motor strength is 5/5 in all extremities.  No atrophy, fasciculations or abnormal movements.  No pronator drift.  Tone is normal.    MSRs:  Reflexes are 2+/4 throughout, except ankle jerks.  SENSORY:  Intact to temperature and vibration throughout.  Rhomberg sign is absent.   COORDINATION/GAIT:  Normal finger-to- nose-finger and heel-to-shin.  Intact rapid alternating movements bilaterally.  Gait narrow based and stable.    Data: NCS/EMG of the right side and left arm 10/09/2022: Bilateral median neuropathy at or distal to the wrist, consistent with a clinical diagnosis of carpal tunnel syndrome.  Overall, these findings are moderate on the right, and mild on the left. Chronic S1 radiculopathy affecting the  right lower extremity, mild. There is no evidence of a large fiber sensorimotor polyneuropathy affecting the right side.  MRI lumbar spine wo contrast 07/23/2023: Multilevel degenerative spondylosis with levels described in detail above. Multiple levels of mild to moderate degenerative foraminal narrowing.   L2-3 has a small right foraminal protrusion stripping to moderate foraminal narrowing with possible contact of the exiting L2 nerve.    Thank you for allowing me to participate in patient's care.  If I can answer any additional questions, I would be pleased to do so.    Sincerely,    Lamir Racca K. Tobie, DO

## 2023-08-03 NOTE — Assessment & Plan Note (Signed)
 Plan: - Has CKD, should avoid NSAIDs.  Is okay to use Tylenol  as needed

## 2023-08-03 NOTE — Progress Notes (Signed)
 DATE OF VISIT: 08/03/2023        Brandy Medina DOB: 16-Apr-1950 MRN: [id number]  CC: Follow-up low back pain and MRI  History of present Illness: Brandy Medina is a 73 y.o. female who presents for a follow-up visit to review MRI of her lumbar spine Last seen by Dr. Vita 06/30/2023 At that time was having low back pain with radicular symptoms into her right leg/thigh Was given a prednisone  taper at that time.  She notes some improvement after finishing the steroids. Has tried using meloxicam  as needed, but does trigger headaches so she has been trying to avoid Has been using Tylenol  and ibuprofen as needed with some improvement She has not done any recent physical therapy, last PT was over a year ago, she would be interested in new PT Denies any numbness or tingling Denies any changes in bowel or bladder Denies any lower extremity weakness Was seen by neurology earlier today for routine follow-up  Of note, he was traveling to [place]  in the near future for her son's wedding on 08/15/2023.  He is getting married in the [place] area  Medications:  Outpatient Encounter Medications as of 08/03/2023  Medication Sig   acetaminophen  (TYLENOL ) 500 MG tablet Take 500-1,000 mg by mouth every 6 (six) hours as needed for moderate pain or headache.   amLODipine  (NORVASC ) 2.5 MG tablet Take 1 tablet (2.5 mg total) by mouth daily.   butalbital -acetaminophen -caffeine  (FIORICET) 50-325-40 MG tablet Take 1 tablet by mouth every 6 (six) hours as needed for migraine.   chlorpheniramine (CHLOR-TRIMETON) 4 MG tablet Take 4 mg by mouth every 4 (four) hours as needed for allergies.   Cholecalciferol (VITAMIN D) 50 MCG (2000 UT) tablet Take 1,000 Units by mouth daily. Tues and Thurs   dextromethorphan (DELSYM) 30 MG/5ML liquid Take 30 mg by mouth 2 (two) times daily as needed for cough.   epoetin  alfa (EPOGEN ) 10000 UNIT/ML injection as directed Injection once every 3 weeks for 3 months   glucose blood test strip Use as  instructed   Homeopathic Products (ARNICARE PAIN RELIEF EX) Apply 1 application. topically daily as needed (pain).   Respiratory Therapy Supplies (FLUTTER) DEVI Use as directed.   rosuvastatin  (CRESTOR ) 5 MG tablet Take 1 tablet (5 mg total) by mouth every Tuesday.   valsartan  (DIOVAN ) 320 MG tablet Take 1 tablet (320 mg total) by mouth daily.   Wheat Dextrin (BENEFIBER PO) Take 1 Scoop by mouth daily.   [DISCONTINUED] amLODipine  (NORVASC ) 5 MG tablet May take an additional 2.5 mg tablet if systolic blood pressure 160 if needed. (Patient not taking: Reported on 08/03/2023)   [DISCONTINUED] meloxicam  (MOBIC ) 15 MG tablet Take one pill a day with food for 7 days and then prn thereafter   [DISCONTINUED] predniSONE  (DELTASONE ) 10 MG tablet Take as directed per MD instructions (Patient not taking: Reported on 08/03/2023)   No facility-administered encounter medications on file as of 08/03/2023.    Allergies: is allergic to avelox [moxifloxacin hcl in nacl], avelox [moxifloxacin], azathioprine , codeine, erythromycin, fexofenadine, levocetirizine, lisinopril, sulfa antibiotics, sulfonamide derivatives, and zyrtec [cetirizine].  Physical Examination: Vitals: BP 126/72   Ht 5' 4 (1.626 m)   Wt 158 lb (71.7 kg)   BMI 27.12 kg/m  GENERAL:  Brandy Medina is a 73 y.o. female appearing their stated age, alert and oriented x 3, in no apparent distress.  SKIN: no rashes or lesions, skin clean, dry, intact MSK: Seated comfortably in the exam room table.  Good range  of motion without pain.  Normal gross lower extremity strength bilaterally.  Walking without a limp. Neurovascularly intact distally  Radiology: MRI L-spine 07/11/2023 showing: L1-2 has mild bilateral foraminal narrowing due to broad-based disc bulge and facet hypertrophy.   L2-3 has a small right foraminal protrusion combined with facet hypertrophy to cause moderate foraminal narrowing with possible contact of the exiting nerve. There is mild  to moderate degenerative left-sided foraminal narrowing and mild central stenosis as well due to broad-based disc bulge and facet/ligament flavum hypertrophy.   L3-4 has mild to moderate bilateral foraminal narrowing greater on the left due to broad-based disc bulge and facet hypertrophy.   L4-5 has mild to moderate bilateral foraminal narrowing and mild bilateral lateral recess narrowing due to broad-based disc bulge and facet/ligament flavum hypertrophy.   L5-S1 has mild to moderate bilateral foraminal narrowing and mild bilateral lateral recess narrowing due to broad-based disc bulge and facet/ligament flavum hypertrophy.   The imaged retroperitoneal structures are unremarkable.     IMPRESSION: Multilevel degenerative spondylosis with levels described in detail above. Multiple levels of mild to moderate degenerative foraminal narrowing.   L2-3 has a small right foraminal protrusion stripping to moderate foraminal narrowing with possible contact of the exiting L2 nerve.  Assessment & Plan Degeneration of intervertebral disc of lumbar region with discogenic back pain and lower extremity pain Right-sided low back pain with underlying degenerative disc disease as seen on recent MRI, symptoms consistent with discogenic back pain and right-sided radiculopathy.  Has improved with oral steroids and NSAIDs, but still having some mild symptoms  Plan: - MRI reviewed with patient in detail today - She would benefit from return to physical therapy.  Referral to [facility] PT was given today. - Recommend she limit NSAIDs due to underlying CKD.  Is okay for her to use Tylenol  Extra Strength 1-2 tabs every 6-8 hours as needed - Heat or ice as needed - Topical lidocaine  patches as needed - Follow-up 6 to 8 weeks if no improvement, could consider possible referral for epidural injection at that time Renal insufficiency Plan: - Has CKD, should avoid NSAIDs.  Is okay to use Tylenol   as needed   Patient expressed understanding & agreement with above.  Encounter Diagnoses  Name Primary?   Degeneration of intervertebral disc of lumbar region with discogenic back pain and lower extremity pain Yes   Renal insufficiency     Orders Placed This Encounter  Procedures   Ambulatory referral to Physical Therapy

## 2023-08-10 ENCOUNTER — Ambulatory Visit: Payer: Medicare PPO | Admitting: Hematology and Oncology

## 2023-08-10 ENCOUNTER — Inpatient Hospital Stay: Payer: Medicare PPO | Attending: Hematology and Oncology

## 2023-08-10 ENCOUNTER — Encounter: Payer: Self-pay | Admitting: Hematology and Oncology

## 2023-08-10 ENCOUNTER — Ambulatory Visit: Admitting: Family Medicine

## 2023-08-10 DIAGNOSIS — R7989 Other specified abnormal findings of blood chemistry: Secondary | ICD-10-CM | POA: Insufficient documentation

## 2023-08-10 DIAGNOSIS — D72819 Decreased white blood cell count, unspecified: Secondary | ICD-10-CM | POA: Insufficient documentation

## 2023-08-10 DIAGNOSIS — B9681 Helicobacter pylori [H. pylori] as the cause of diseases classified elsewhere: Secondary | ICD-10-CM | POA: Insufficient documentation

## 2023-08-10 DIAGNOSIS — D649 Anemia, unspecified: Secondary | ICD-10-CM | POA: Insufficient documentation

## 2023-08-10 DIAGNOSIS — K2970 Gastritis, unspecified, without bleeding: Secondary | ICD-10-CM | POA: Insufficient documentation

## 2023-08-10 LAB — IRON AND IRON BINDING CAPACITY (CC-WL,HP ONLY)
Iron: 89 ug/dL (ref 28–170)
Saturation Ratios: 30 % (ref 10.4–31.8)
TIBC: 295 ug/dL (ref 250–450)
UIBC: 206 ug/dL (ref 148–442)

## 2023-08-10 LAB — CMP (CANCER CENTER ONLY)
ALT: 15 U/L (ref 0–44)
AST: 22 U/L (ref 15–41)
Albumin: 3.8 g/dL (ref 3.5–5.0)
Alkaline Phosphatase: 67 U/L (ref 38–126)
Anion gap: 4 — ABNORMAL LOW (ref 5–15)
BUN: 23 mg/dL (ref 8–23)
CO2: 28 mmol/L (ref 22–32)
Calcium: 9.7 mg/dL (ref 8.9–10.3)
Chloride: 103 mmol/L (ref 98–111)
Creatinine: 0.9 mg/dL (ref 0.44–1.00)
GFR, Estimated: >60 mL/min (ref >60)
Glucose, Bld: 91 mg/dL (ref 70–99)
Potassium: 4.1 mmol/L (ref 3.5–5.1)
Sodium: 135 mmol/L (ref 135–145)
Total Bilirubin: 0.4 mg/dL (ref 0.0–1.2)
Total Protein: 8.9 g/dL — ABNORMAL HIGH (ref 6.5–8.1)

## 2023-08-10 LAB — CBC WITH DIFFERENTIAL (CANCER CENTER ONLY)
Abs Immature Granulocytes: 0.01 K/uL (ref 0.00–0.07)
Basophils Absolute: 0 K/uL (ref 0.0–0.1)
Basophils Relative: 0 %
Eosinophils Absolute: 0.1 K/uL (ref 0.0–0.5)
Eosinophils Relative: 2 %
HCT: 29.9 % — ABNORMAL LOW (ref 36.0–46.0)
Hemoglobin: 10 g/dL — ABNORMAL LOW (ref 12.0–15.0)
Immature Granulocytes: 0 %
Lymphocytes Relative: 29 %
Lymphs Abs: 1 K/uL (ref 0.7–4.0)
MCH: 31.4 pg (ref 26.0–34.0)
MCHC: 33.4 g/dL (ref 30.0–36.0)
MCV: 94 fL (ref 80.0–100.0)
Monocytes Absolute: 0.6 K/uL (ref 0.1–1.0)
Monocytes Relative: 18 %
Neutro Abs: 1.8 K/uL (ref 1.7–7.7)
Neutrophils Relative %: 51 %
Platelet Count: 211 K/uL (ref 150–400)
RBC: 3.18 MIL/uL — ABNORMAL LOW (ref 3.87–5.11)
RDW: 14.4 % (ref 11.5–15.5)
WBC Count: 3.5 K/uL — ABNORMAL LOW (ref 4.0–10.5)
nRBC: 0 % (ref 0.0–0.2)

## 2023-08-10 LAB — FERRITIN: Ferritin: 569 ng/mL — ABNORMAL HIGH (ref 11–307)

## 2023-08-11 ENCOUNTER — Inpatient Hospital Stay (HOSPITAL_BASED_OUTPATIENT_CLINIC_OR_DEPARTMENT_OTHER): Admitting: Hematology and Oncology

## 2023-08-11 ENCOUNTER — Telehealth: Payer: Self-pay | Admitting: Hematology and Oncology

## 2023-08-11 DIAGNOSIS — D649 Anemia, unspecified: Secondary | ICD-10-CM

## 2023-08-11 NOTE — Telephone Encounter (Signed)
 left vm for pt to call and schedule follow up appointment

## 2023-08-11 NOTE — Assessment & Plan Note (Signed)
 04/17/2020: Hemoglobin 7.9, MCV 92.2, WBC 4.9, platelets 88, erythropoietin  38.9, ferritin 2445, iron saturation 33%, LDH 196, SPEP: Polyclonal rise in immunoglobulins, no M protein, AST 55, ALT 66, alkaline phosphatase 496, creatinine 1 07/06/2020: WBC 3.1, hemoglobin 9.4, MCV 99, platelets 254, ANC 1.7, ESR 41, CRP 1, total protein 9.3, globulins 5.2, rest of the LFTs are normal 10/07/2021: Hemoglobin 10.4 04/28/2022: Hemoglobin 10.4 (this is without Retacrit  injections) 08/04/2022: Hemoglobin 10.1, WBC 3.3 02/05/2023: Hemoglobin 9.7, WBC 3.3, ANC 1.9 08/10/2023: Hemoglobin 10, MCV 94, WBC 3.5, ANC 1.8, CMP: Normal, creatinine 0.9, iron saturation 30%, ferritin 569 close Brandy Medina Dr. 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 Pylori   Indication for bone marrow biopsy: If ANC drops below 1 and development of other cytopenias   Prior treatment: Retacrit  40,000 units every 3 weeks started 08/05/2021, last injection on 11/05/2021 We discontinued Retacrit  injection   Leukopenia: ANC is 2.  Therefore there is no role for bone marrow biopsy at this time. She is following with rheumatology. I will see her back in 6 months with labs and follow-up.

## 2023-08-11 NOTE — Progress Notes (Signed)
 HEMATOLOGY-ONCOLOGY TELEPHONE VISIT PROGRESS NOTE  I connected with our patient on 08/11/23 at  2:15 PM EDT by telephone and verified that I am speaking with the correct person using two identifiers.  I discussed the limitations, risks, security and privacy concerns of performing an evaluation and management service by telephone and the availability of in person appointments.  I also discussed with the patient that there may be a patient responsible charge related to this service. The patient expressed understanding and agreed to proceed.   History of Present Illness: Follow-up of anemia and leukopenia  History of Present Illness Brandy Medina is a 73 year old female who presents for follow-up of her hematological status.  Her hemoglobin levels have been consistently low, with readings of 9.7, 10.4, 10.1, and most recently 10 over the past two to three years. She is not receiving any injections for this condition. Her most recent white blood cell count is 3.5. Iron levels, including iron saturation and ferritin, are within normal limits.  In 2022, elevated protein levels were investigated with electrophoresis, which ruled out abnormal proteins.    REVIEW OF SYSTEMS:   Constitutional: Denies fevers, chills or abnormal weight loss All other systems were reviewed with the patient and are negative. Observations/Objective:     Assessment Plan:  Normocytic anemia 04/17/2020: Hemoglobin 7.9, MCV 92.2, WBC 4.9, platelets 88, erythropoietin  38.9, ferritin 2445, iron saturation 33%, LDH 196, SPEP: Polyclonal rise in immunoglobulins, no M protein, AST 55, ALT 66, alkaline phosphatase 496, creatinine 1 07/06/2020: WBC 3.1, hemoglobin 9.4, MCV 99, platelets 254, ANC 1.7, ESR 41, CRP 1, total protein 9.3, globulins 5.2, rest of the LFTs are normal 10/07/2021: Hemoglobin 10.4 04/28/2022: Hemoglobin 10.4 (this is without Retacrit  injections) 08/04/2022: Hemoglobin 10.1, WBC 3.3 02/05/2023: Hemoglobin 9.7, WBC  3.3, ANC 1.9 08/10/2023: Hemoglobin 10, MCV 94, WBC 3.5, ANC 1.8, CMP: Normal, creatinine 0.9, iron saturation 30%, ferritin 569     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 Pylori   Indication for bone marrow biopsy: If ANC drops below 1 and development of other cytopenias   Prior treatment: Retacrit  40,000 units every 3 weeks started 08/05/2021, last injection on 11/05/2021 We discontinued Retacrit  injection   Leukopenia: ANC is 1.8.  Therefore there is no role for bone marrow biopsy at this time. She is following with rheumatology. I will see her back in 6 months with labs and follow-up.    I discussed the assessment and treatment plan with the patient. The patient was provided an opportunity to ask questions and all were answered. The patient agreed with the plan and demonstrated an understanding of the instructions. The patient was advised to call back or seek an in-person evaluation if the symptoms worsen or if the condition fails to improve as anticipated.   I provided 20 minutes of non-face-to-face time during this encounter.  This includes time for charting and coordination of care   Naomi MARLA Chad, MD

## 2023-08-19 ENCOUNTER — Ambulatory Visit: Admitting: Dermatology

## 2023-08-19 ENCOUNTER — Encounter: Payer: Self-pay | Admitting: Dermatology

## 2023-08-19 VITALS — BP 140/86

## 2023-08-19 DIAGNOSIS — E669 Obesity, unspecified: Secondary | ICD-10-CM | POA: Insufficient documentation

## 2023-08-19 DIAGNOSIS — M542 Cervicalgia: Secondary | ICD-10-CM | POA: Insufficient documentation

## 2023-08-19 DIAGNOSIS — L819 Disorder of pigmentation, unspecified: Secondary | ICD-10-CM

## 2023-08-19 DIAGNOSIS — G5793 Unspecified mononeuropathy of bilateral lower limbs: Secondary | ICD-10-CM | POA: Insufficient documentation

## 2023-08-19 DIAGNOSIS — Z6838 Body mass index (BMI) 38.0-38.9, adult: Secondary | ICD-10-CM | POA: Insufficient documentation

## 2023-08-19 DIAGNOSIS — L821 Other seborrheic keratosis: Secondary | ICD-10-CM

## 2023-08-19 DIAGNOSIS — M5134 Other intervertebral disc degeneration, thoracic region: Secondary | ICD-10-CM | POA: Insufficient documentation

## 2023-08-19 MED ORDER — SAFETY SEAL MISCELLANEOUS MISC: 4 refills | Status: AC

## 2023-08-19 NOTE — Progress Notes (Signed)
 New Patient Visit   Subjective  Brandy Medina is a 73 y.o. female who presents for a NEW PATIENT appointment to be examined for the concerns as listed below.   PIH: Located at the face & hands that started around 2017. She mentioned that she was Dx Sjogrens Dx in 2017 also and wonders if it is related. She has not previously been treated for these areas.    Add on: Growth on L flank that she noticed around 67mo ago. Area not is bothersome but dry. Would like it examined.    Patient denied Hx of Bx. Patient denied family Hx of skin cancer.   The following portions of the chart were reviewed this encounter and updated as appropriate: medications, allergies, medical history  Review of Systems:  No other skin or systemic complaints except as noted in HPI or Assessment and Plan.  Objective  Well appearing patient in no apparent distress; mood and affect are within normal limits.   A focused examination was performed of the following areas: face & hands   Relevant exam findings are noted in the Assessment and Plan.                    Assessment & Plan   1. Facial and Hand Hyperpigmentation - Assessment: Patient presents with hyperpigmentation affecting face (particularly around eyes, forehead, and chin) and hands, with onset around 2017-2018 and recent progression to forehead within the last 8-12 months. The condition appears to be primarily sun-related, affecting sun-exposed areas. Differential diagnosis includes melasma and other forms of hypermelanosis. Likely a combination of superficial and deep pigmentation, with the latter being more resistant to topical treatments. Patient reports consistent use of sunscreen. No clear association with medications or allergies identified, although patient started valsartan  around the time of symptom onset.  - Plan:    Prescribe tranexamic acid compound (with vitamin C, kojic acid, and resveratrol) from [facility] compounding pharmacy      - Apply twice daily, morning and night     - Patient informed of $45 cost and delivery process    Recommend Eucerin Radiant Tone with thiamidol     - Apply twice daily, before the prescription compound    Sun protection:     - Continue daily sunscreen use, emphasizing reapplication for prolonged sun exposure     - Recommend sunscreen stick for touch-ups during outdoor activities     - Advise wearing a hat for additional protection    Introduce weekly use of Farmacy toner with 2% salicylic acid and papaya     - Use on Saturday evenings for gentle exfoliation    Patient education:     - Inform that improvement may take 4-6 months to become noticeable  Follow-up appointment in 6 months to assess progress.  2. Seborrheic Keratosis - Assessment: Lesion consistent with seborrheic keratosis, described as a brown, waxy, succulent growth. Benign age-related skin growth, often referred to as a wisdom spot or Mother Nature's birthday present.  - Plan:    Reassure patient about benign nature of the lesion    No specific treatment required at this time  3. Hair Thinning - Assessment: Patient reports concerns about hair thinning. Detailed assessment deferred to upcoming appointment.  - Plan:    Schedule separate appointment within the next 2 months for comprehensive evaluation of hair thinning  Follow-up on both hair thinning and hyperpigmentation in 6 months.  HYPERPIGMENTATION   Related Medications Safety Seal Miscellaneous MISC melaxemic cream -  tranexamic acid 5% kojic acid 2% vit c 2.5% hyaluronic acid 0.1% - apply to affected areas BID morning and night.  Return in about 6 months (around 02/19/2024) for [facility] & OV for 2 weeks from now for hair concerns.   Documentation: I have reviewed the above documentation for accuracy and completeness, and I agree with the above.  I, Shirron Maranda, CMA, am acting as scribe for [employer], DO.   Delon Lenis, DO

## 2023-08-19 NOTE — Patient Instructions (Addendum)
 Date: Wed Aug 19 2023  Hello Lorayne,  Thank you for visiting today. Here is a summary of the key instructions:  - Medications:   - Use tranexamic acid cream twice daily (morning and night)   - Apply Eucerin Radiant Tone cream twice daily (morning and night)   - Use Farmacy papaya toner with 2% salicylic acid once a week on Saturday evenings  - Skincare Routine: Morning   - Wash face in the   - Apply pea-sized amount of Eucerin Radiant Tone cream all over face   - Apply pea-sized amount of prescription tranexamic acid cream on top   - Apply sunscreen   - Apply makeup if desired  Evening - Wash face in the    - Apply pea-sized amount of Eucerin Radiant Tone cream all over face   - Apply pea-sized amount of prescription tranexamic acid cream on top   - Sun Protection:   - Wear sunscreen daily   - Reapply sunscreen midday, especially if outdoors   - Wear a hat when gardening or doing outdoor activities   - Use sunscreen stick for easy reapplication  - Follow-up:   - Schedule appointment for hair thinning within the next 2 months   - Follow-up appointment in 6 months for hyperpigmentation and hair thinning  Please reach out if you have any questions or concerns.  Warm regards,  Dr. Delon Lenis, Dermatology        Important Information  Due to recent changes in healthcare laws, you may see results of your pathology and/or laboratory studies on MyChart before the doctors have had a chance to review them. We understand that in some cases there may be results that are confusing or concerning to you. Please understand that not all results are received at the same time and often the doctors may need to interpret multiple results in order to provide you with the best plan of care or course of treatment. Therefore, we ask that you please give us  2 business days to thoroughly review all your results before contacting the office for clarification. Should we see a critical lab  result, you will be contacted sooner.   If You Need Anything After Your Visit  If you have any questions or concerns for your doctor, please call our main line at [phone] If no one answers, please leave a voicemail as directed and we will return your call as soon as possible. Messages left after 4 pm will be answered the following business day.   You may also send us  a message via MyChart. We typically respond to MyChart messages within 1-2 business days.  For prescription refills, please ask your pharmacy to contact our office. Our fax number is [phone].  If you have an urgent issue when the clinic is closed that cannot wait until the next business day, you can page your doctor at the number below.    Please note that while we do our best to be available for urgent issues outside of office hours, we are not available 24/7.   If you have an urgent issue and are unable to reach us , you may choose to seek medical care at your doctor's office, retail clinic, urgent care center, or emergency room.  If you have a medical emergency, please immediately call 911 or go to the emergency department. In the event of inclement weather, please call our main line at [phone] for an update on the status of any delays or closures.  Dermatology Medication  Tips: Please keep the boxes that topical medications come in in order to help keep track of the instructions about where and how to use these. Pharmacies typically print the medication instructions only on the boxes and not directly on the medication tubes.   If your medication is too expensive, please contact our office at [phone] or send us  a message through MyChart.   We are unable to tell what your co-pay for medications will be in advance as this is different depending on your insurance coverage. However, we may be able to find a substitute medication at lower cost or fill out paperwork to get insurance to cover a needed medication.    If a prior authorization is required to get your medication covered by your insurance company, please allow us  1-2 business days to complete this process.  Drug prices often vary depending on where the prescription is filled and some pharmacies may offer cheaper prices.  The website www.goodrx.com contains coupons for medications through different pharmacies. The prices here do not account for what the cost may be with help from insurance (it may be cheaper with your insurance), but the website can give you the price if you did not use any insurance.  - You can print the associated coupon and take it with your prescription to the pharmacy.  - You may also stop by our office during regular business hours and pick up a GoodRx coupon card.  - If you need your prescription sent electronically to a different pharmacy, notify our office through [facility] or by phone at [phone]

## 2023-08-25 ENCOUNTER — Ambulatory Visit: Attending: Family Medicine | Admitting: Physical Therapy

## 2023-08-25 ENCOUNTER — Other Ambulatory Visit: Payer: Self-pay

## 2023-08-25 ENCOUNTER — Encounter: Payer: Self-pay | Admitting: Physical Therapy

## 2023-08-25 DIAGNOSIS — M51362 Other intervertebral disc degeneration, lumbar region with discogenic back pain and lower extremity pain: Secondary | ICD-10-CM | POA: Insufficient documentation

## 2023-08-25 DIAGNOSIS — M5459 Other low back pain: Secondary | ICD-10-CM | POA: Diagnosis not present

## 2023-08-25 DIAGNOSIS — R29898 Other symptoms and signs involving the musculoskeletal system: Secondary | ICD-10-CM | POA: Diagnosis not present

## 2023-08-25 DIAGNOSIS — R293 Abnormal posture: Secondary | ICD-10-CM | POA: Diagnosis not present

## 2023-08-25 DIAGNOSIS — M6281 Muscle weakness (generalized): Secondary | ICD-10-CM | POA: Diagnosis not present

## 2023-08-25 NOTE — Therapy (Signed)
 OUTPATIENT PHYSICAL THERAPY THORACOLUMBAR EVALUATION   Patient Name: Brandy Medina MRN: [id number] DOB:16-Aug-1950, 73 y.o., female Today's Date: 08/25/2023  END OF SESSION:  PT End of Session - 08/25/23 1147     Visit Number 1    Number of Visits 13    Date for PT Re-Evaluation 10/06/23    Authorization Type [employer]    Authorization Time Period 08/25/23 to 10/06/23    Progress Note Due on Visit 10    PT Start Time 1110   late arrival/check in   PT Stop Time 1141    PT Time Calculation (min) 31 min    Activity Tolerance Patient tolerated treatment well    Behavior During Therapy WFL for tasks assessed/performed          Past Medical History:  Diagnosis Date   Allergic rhinitis    Diabetes (HCC)    HTN (hypertension)    Migraines    PONV (postoperative nausea and vomiting)    Pulmonary fibrosis (HCC)    Past Surgical History:  Procedure Laterality Date   ABDOMINAL HYSTERECTOMY  1989   partial   BIOPSY  06/01/2020   Procedure: BIOPSY;  Surgeon: Saintclair Jasper, MD;  Location: [facility];  Service: Gastroenterology;;  EGD and COLON   BIOPSY  06/04/2021   Procedure: BIOPSY;  Surgeon: Saintclair Jasper, MD;  Location: [facility];  Service: Gastroenterology;;   BREAST SURGERY     COLONOSCOPY WITH PROPOFOL  N/A 06/01/2020   Procedure: COLONOSCOPY WITH PROPOFOL ;  Surgeon: Saintclair Jasper, MD;  Location: [facility];  Service: Gastroenterology;  Laterality: N/A;   ESOPHAGOGASTRODUODENOSCOPY N/A 06/04/2021   Procedure: ESOPHAGOGASTRODUODENOSCOPY (EGD);  Surgeon: Saintclair Jasper, MD;  Location: [facility];  Service: Gastroenterology;  Laterality: N/A;   ESOPHAGOGASTRODUODENOSCOPY (EGD) WITH PROPOFOL  N/A 06/01/2020   Procedure: ESOPHAGOGASTRODUODENOSCOPY (EGD) WITH PROPOFOL ;  Surgeon: Saintclair Jasper, MD;  Location: [facility];  Service: Gastroenterology;  Laterality: N/A;   POLYPECTOMY  06/01/2020   Procedure: POLYPECTOMY;  Surgeon: Saintclair Jasper, MD;  Location: [facility];  Service:  Gastroenterology;;   SALPINGOOPHORECTOMY Left    Patient Active Problem List   Diagnosis Date Noted   DDD (degenerative disc disease), thoracic 08/19/2023   Neck pain 08/19/2023   Neuropathy of both feet 08/19/2023   Body mass index (BMI) 38.0-38.9, adult 08/19/2023   Obesity (BMI 30-39.9) 08/19/2023   Hyperpigmentation 05/10/2023   Hair thinning 05/10/2023   DM cataract (HCC) 05/10/2023   Renal insufficiency 05/10/2023   Herpes zoster without complication 03/04/2023   Aortic atherosclerosis (HCC) 02/15/2023   Paresthesia of lower extremity 02/15/2023   Pulmonary fibrosis (HCC) 02/15/2023   Estrogen deficiency 02/03/2023   Pain in left foot 11/07/2022   Plantar fasciitis of left foot 11/07/2022   Chronic rhinitis 02/05/2022   Recurrent infections 02/05/2022   Dietary counseling and surveillance 02/05/2022   Anemia of infection and chronic disease 07/23/2021   Pain of lumbar spine 02/26/2021   Normocytic anemia 04/17/2020   ILD (interstitial lung disease) (HCC) 02/27/2020   Sjogren's syndrome (HCC) 02/27/2020   Abnormal findings on diagnostic imaging of lung 02/27/2020   Abnormal ECG 12/12/2015   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   Essential hypertension 07/07/2014   Upper airway cough syndrome 07/06/2014   Obstructive bronchiectasis (HCC) 06/20/2009   Nonspecific (abnormal) findings on radiological and other examination of body structure 08/02/2008   COMPUTERIZED TOMOGRAPHY, CHEST, ABNORMAL 08/02/2008   Hypoglycemia 08/01/2008   MIGRAINE HEADACHE 08/01/2008   Sinusitis, chronic 08/01/2008   GERD 08/01/2008  ABDOMINAL PAIN, CHRONIC 08/01/2008   ANEMIA, IRON DEFICIENCY, HX OF 08/01/2008   DIVERTICULOSIS, COLON, HX OF 08/01/2008    PCP: Jarold Medici MD   REFERRING PROVIDER: Teressa Rainell BROCKS, DO  REFERRING DIAG:  Diagnosis  [phone] (ICD-10-CM) - Degeneration of intervertebral disc of lumbar region with discogenic back pain  and lower extremity pain    Rationale for Evaluation and Treatment: Rehabilitation  THERAPY DIAG:  Other low back pain  Muscle weakness (generalized)  Other symptoms and signs involving the musculoskeletal system  Abnormal posture  ONSET DATE: 2022 for back pain, stabbing in thigh is more recent  SUBJECTIVE:                                                                                                                                                                                           SUBJECTIVE STATEMENT:  I have a lot going on in my back from the images, there was one morning where I had a big stab in my right thigh that lasted for 5 min if that but then I could barely walk. No numbness in the leg but do have neuropathy. No treatments for the back since 2022 besides [facility] which really didn't help. Have some balance concerns.   PERTINENT HISTORY:  See above   PAIN:  Are you having pain? Its just an ache not really pain   PRECAUTIONS: None  RED FLAGS: None   WEIGHT BEARING RESTRICTIONS: No  FALLS:  Has patient fallen in last 6 months? No  LIVING ENVIRONMENT: Lives with: lives with their spouse Lives in: House/apartment   OCCUPATION: retired- taught school   PLOF: Independent, Independent with basic ADLs, Independent with gait, and Independent with transfers  PATIENT GOALS: get rid of aching, willing to get up and move a little more often if that'll help   NEXT MD VISIT: referring PRN   OBJECTIVE:  Note: Objective measures were completed at Evaluation unless otherwise noted.  DIAGNOSTIC FINDINGS:   EXAM DESCRIPTION: MR LUMBAR SPINE WO CONTRAST   CLINICAL HISTORY: Lumbar radiculopathy, symptoms persist with > 6 wks treatment   COMPARISON: None Available.   TECHNIQUE: MRI of the lumbar spine is performed according to our usual protocol with axial and sagittal multi sequence imaging.   FINDINGS: The alignment is unremarkable. Mild  degenerative disc disease throughout. No discogenic endplate marrow edema. The marrow signal is unremarkable as well as the conus and cauda equina.   L1-2 has mild bilateral foraminal narrowing due to broad-based disc bulge and facet hypertrophy.   L2-3 has a small right foraminal protrusion combined with facet hypertrophy to cause moderate foraminal  narrowing with possible contact of the exiting nerve. There is mild to moderate degenerative left-sided foraminal narrowing and mild central stenosis as well due to broad-based disc bulge and facet/ligament flavum hypertrophy.   L3-4 has mild to moderate bilateral foraminal narrowing greater on the left due to broad-based disc bulge and facet hypertrophy.   L4-5 has mild to moderate bilateral foraminal narrowing and mild bilateral lateral recess narrowing due to broad-based disc bulge and facet/ligament flavum hypertrophy.   L5-S1 has mild to moderate bilateral foraminal narrowing and mild bilateral lateral recess narrowing due to broad-based disc bulge and facet/ligament flavum hypertrophy.   The imaged retroperitoneal structures are unremarkable.     IMPRESSION: Multilevel degenerative spondylosis with levels described in detail above. Multiple levels of mild to moderate degenerative foraminal narrowing.   L2-3 has a small right foraminal protrusion stripping to moderate foraminal narrowing with possible contact of the exiting L2 nerve.  Narrative & Impression CLINICAL DATA:  Low back pain radiating into right leg.   EXAM: LUMBAR SPINE - 2-3 VIEW   COMPARISON:  None Available.   FINDINGS: Five non-rib-bearing lumbar vertebra. Normal lumbar alignment. No evidence of fracture or compression deformity. No visible pars defects or focal bone abnormalities. Mild diffuse disc space narrowing and spurring, most prominently affecting L1-L2 and L2-L3. Mild multilevel facet hypertrophy. Sacroiliac joints are congruent with mild  degenerative spurring.   IMPRESSION: Mild multilevel degenerative disc disease and facet hypertrophy.  PATIENT SURVEYS:   Attempted PSFS, time limited and pt had difficulty identifying items that are isolated to her back and not linked to other medical issues   COGNITION: Overall cognitive status: WNL     SENSATION: Hx peripheral neuropathy   MUSCLE LENGTH: HS Mod limitation B Piriformis mod limitation B   POSTURE: rounded shoulders and forward head    LUMBAR ROM:   AROM eval  Flexion 50% limited tight   Extension 75% limited with hip compensation, REIS   Right lateral flexion WNL tight   Left lateral flexion WNL tight   Right rotation 25%  Left rotation 25%   (Blank rows = not tested)    LOWER EXTREMITY MMT:    MMT Right eval Left eval  Hip flexion 4- 4+  Hip extension    Hip abduction 3 3  Hip adduction    Hip internal rotation    Hip external rotation    Knee flexion 4+ 4+  Knee extension 4- 4  Ankle dorsiflexion    Ankle plantarflexion    Ankle inversion    Ankle eversion     (Blank rows = not tested)   BALANCE   TUG 10 seconds SLS 7 seconds at best R/L Tandem stance 7-26 seconds R/L    TREATMENT DATE:   08/25/23  Eval, POC/HEP practice as below  PATIENT EDUCATION:  Education details: exam findings, POC, HEP  Person educated: Patient and Spouse Education method: Explanation, Demonstration, and Handouts Education comprehension: verbalized understanding, returned demonstration, and needs further education  HOME EXERCISE PROGRAM: Access Code: [email or web address] URL: [email or web address] Date: 08/25/2023 Prepared by: Josette Rough  Exercises - Supine Lower Trunk Rotation  - 2 x daily - 7 x weekly - 1 sets - 10 reps - 5 seconds  hold - Supine Figure 4 Piriformis Stretch  - 2 x daily - 7 x weekly - 1  sets - 4 reps - 30 seconds  hold - Supine Bridge  - 1 x daily - 7 x weekly - 2 sets - 10 reps - 1 second  hold - Standing Transverse Abdominis Contraction  - 3 x daily - 7 x weekly - 1 sets - 10 reps - 5 seconds  hold  ASSESSMENT:  CLINICAL IMPRESSION: Patient is a 73 y.o. F who was seen today for physical therapy evaluation and treatment for  Diagnosis  M51.362 (ICD-10-CM) - Degeneration of intervertebral disc of lumbar region with discogenic back pain and lower extremity pain  . Late arrival to eval, objective measures limited today. Somewhat of an unclear historian. Will make every effort to improve pain and function moving forward.   OBJECTIVE IMPAIRMENTS: Abnormal gait, decreased activity tolerance, decreased balance, decreased mobility, difficulty walking, decreased ROM, decreased strength, increased fascial restrictions, increased muscle spasms, impaired flexibility, improper body mechanics, postural dysfunction, and pain.   ACTIVITY LIMITATIONS: carrying, lifting, sitting, standing, squatting, stairs, transfers, and locomotion level  PARTICIPATION LIMITATIONS: meal prep, cleaning, laundry, driving, shopping, community activity, occupation, and yard work  PERSONAL FACTORS: Age, Behavior pattern, Education, Fitness, Past/current experiences, Social background, and Time since onset of injury/illness/exacerbation are also affecting patient's functional outcome.   REHAB POTENTIAL: Fair chronicity of pain, Sjogrens   CLINICAL DECISION MAKING: Stable/uncomplicated  EVALUATION COMPLEXITY: Low   GOALS: Goals reviewed with patient? No  SHORT TERM GOALS: Target date: 09/15/2023    Will be compliant with appropriate progressive HEP GOAL STATUS: Initial   2. Will demonstrate improved postural awareness with all functional tasks, use of ergonomic aides PRN/as desired GOAL STATUS: Initial   3. Will demonstrate good functional biomechanics for bed mobility and floor to waist lifting  mechanics   GOAL STATUS: initial  4. Will be able to maintain SLS for 15 seconds and tandem for at least 30 seconds B GOAL STATUS: INITIAL     LONG TERM GOALS: Target date: 10/06/2023    MMT to have improved by one grade all weak groups GOAL STATUS: Initial  2. Mm flexibility and spasms to have improved by at least 50% in order to improve functional movement patterns and for pain control GOAL STATUS: Initial  3. Pain to be no more than 2/10 with all functional activities GOAL STATUS: Initial   4. Will score at least 20 on DGI to show reduced fall risk    PLAN:  PT FREQUENCY: 2x/week  PT DURATION: 6 weeks  PLANNED INTERVENTIONS: 97750- Physical Performance Testing, 97110-Therapeutic exercises, 97530- Therapeutic activity, [id number]- Neuromuscular re-education, 97535- Self Care, and [id number]- Manual therapy.  PLAN FOR NEXT SESSION: core/proximal strength, balance training, postural retraining and biomechanics   Josette Rough, PT, DPT 08/25/23 11:48 AM

## 2023-08-27 ENCOUNTER — Ambulatory Visit: Admitting: Physical Therapy

## 2023-08-27 ENCOUNTER — Encounter: Payer: Self-pay | Admitting: Physical Therapy

## 2023-08-27 DIAGNOSIS — R293 Abnormal posture: Secondary | ICD-10-CM

## 2023-08-27 DIAGNOSIS — M5459 Other low back pain: Secondary | ICD-10-CM

## 2023-08-27 DIAGNOSIS — R29898 Other symptoms and signs involving the musculoskeletal system: Secondary | ICD-10-CM

## 2023-08-27 DIAGNOSIS — M6281 Muscle weakness (generalized): Secondary | ICD-10-CM

## 2023-08-27 DIAGNOSIS — M51362 Other intervertebral disc degeneration, lumbar region with discogenic back pain and lower extremity pain: Secondary | ICD-10-CM | POA: Diagnosis not present

## 2023-08-27 NOTE — Therapy (Signed)
 OUTPATIENT PHYSICAL THERAPY THORACOLUMBAR EVALUATION   Patient Name: Brandy Medina MRN: [id number] DOB:[birth year], 73 y.o., female Today's Date: 08/27/2023  END OF SESSION:  PT End of Session - 08/27/23 0930     Visit Number 2    Date for PT Re-Evaluation 10/06/23    PT Start Time 0930    PT Stop Time 1015    PT Time Calculation (min) 45 min    Activity Tolerance Patient tolerated treatment well    Behavior During Therapy WFL for tasks assessed/performed          Past Medical History:  Diagnosis Date   Allergic rhinitis    Diabetes (HCC)    HTN (hypertension)    Migraines    PONV (postoperative nausea and vomiting)    Pulmonary fibrosis (HCC)    Past Surgical History:  Procedure Laterality Date   ABDOMINAL HYSTERECTOMY  1989   partial   BIOPSY  06/01/2020   Procedure: BIOPSY;  Surgeon: Saintclair Jasper, MD;  Location: [facility];  Service: Gastroenterology;;  EGD and COLON   BIOPSY  06/04/2021   Procedure: BIOPSY;  Surgeon: Saintclair Jasper, MD;  Location: [facility];  Service: Gastroenterology;;   BREAST SURGERY     COLONOSCOPY WITH PROPOFOL  N/A 06/01/2020   Procedure: COLONOSCOPY WITH PROPOFOL ;  Surgeon: Saintclair Jasper, MD;  Location: [facility];  Service: Gastroenterology;  Laterality: N/A;   ESOPHAGOGASTRODUODENOSCOPY N/A 06/04/2021   Procedure: ESOPHAGOGASTRODUODENOSCOPY (EGD);  Surgeon: Saintclair Jasper, MD;  Location: [facility];  Service: Gastroenterology;  Laterality: N/A;   ESOPHAGOGASTRODUODENOSCOPY (EGD) WITH PROPOFOL  N/A 06/01/2020   Procedure: ESOPHAGOGASTRODUODENOSCOPY (EGD) WITH PROPOFOL ;  Surgeon: Saintclair Jasper, MD;  Location: [facility];  Service: Gastroenterology;  Laterality: N/A;   POLYPECTOMY  06/01/2020   Procedure: POLYPECTOMY;  Surgeon: Saintclair Jasper, MD;  Location: [facility];  Service: Gastroenterology;;   SALPINGOOPHORECTOMY Left    Patient Active Problem List   Diagnosis Date Noted   DDD (degenerative disc disease), thoracic 08/19/2023   Neck  pain 08/19/2023   Neuropathy of both feet 08/19/2023   Body mass index (BMI) 38.0-38.9, adult 08/19/2023   Obesity (BMI 30-39.9) 08/19/2023   Hyperpigmentation 05/10/2023   Hair thinning 05/10/2023   DM cataract (HCC) 05/10/2023   Renal insufficiency 05/10/2023   Herpes zoster without complication 03/04/2023   Aortic atherosclerosis (HCC) 02/15/2023   Paresthesia of lower extremity 02/15/2023   Pulmonary fibrosis (HCC) 02/15/2023   Estrogen deficiency 02/03/2023   Pain in left foot 11/07/2022   Plantar fasciitis of left foot 11/07/2022   Chronic rhinitis 02/05/2022   Recurrent infections 02/05/2022   Dietary counseling and surveillance 02/05/2022   Anemia of infection and chronic disease 07/23/2021   Pain of lumbar spine 02/26/2021   Normocytic anemia 04/17/2020   ILD (interstitial lung disease) (HCC) 02/27/2020   Sjogren's syndrome (HCC) 02/27/2020   Abnormal findings on diagnostic imaging of lung 02/27/2020   Abnormal ECG 12/12/2015   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   Essential hypertension 07/07/2014   Upper airway cough syndrome 07/06/2014   Obstructive bronchiectasis (HCC) 06/20/2009   Nonspecific (abnormal) findings on radiological and other examination of body structure 08/02/2008   COMPUTERIZED TOMOGRAPHY, CHEST, ABNORMAL 08/02/2008   Hypoglycemia 08/01/2008   MIGRAINE HEADACHE 08/01/2008   Sinusitis, chronic 08/01/2008   GERD 08/01/2008   ABDOMINAL PAIN, CHRONIC 08/01/2008   ANEMIA, IRON DEFICIENCY, HX OF 08/01/2008   DIVERTICULOSIS, COLON, HX OF 08/01/2008    PCP: Jarold Medici MD   REFERRING PROVIDER: Teressa Rainell BROCKS, DO  REFERRING DIAG:  Diagnosis  M51.362 (ICD-10-CM) - Degeneration of intervertebral disc of lumbar region with discogenic back pain and lower extremity pain    Rationale for Evaluation and Treatment: Rehabilitation  THERAPY DIAG:  Other low back pain  Muscle weakness (generalized)  Other  symptoms and signs involving the musculoskeletal system  Abnormal posture  ONSET DATE: 2022 for back pain, stabbing in thigh is more recent  SUBJECTIVE:                                                                                                                                                                                           SUBJECTIVE STATEMENT: Issues with the back. No pain, always have stiffness.    I have a lot going on in my back from the images, there was one morning where I had a big stab in my right thigh that lasted for 5 min if that but then I could barely walk. No numbness in the leg but do have neuropathy. No treatments for the back since 2022 besides [facility] which really didn't help. Have some balance concerns.   PERTINENT HISTORY:  See above   PAIN:  Are you having pain? 0/10  PRECAUTIONS: None  RED FLAGS: None   WEIGHT BEARING RESTRICTIONS: No  FALLS:  Has patient fallen in last 6 months? No  LIVING ENVIRONMENT: Lives with: lives with their spouse Lives in: House/apartment   OCCUPATION: retired- taught school   PLOF: Independent, Independent with basic ADLs, Independent with gait, and Independent with transfers  PATIENT GOALS: get rid of aching, willing to get up and move a little more often if that'll help   NEXT MD VISIT: referring PRN   OBJECTIVE:  Note: Objective measures were completed at Evaluation unless otherwise noted.  DIAGNOSTIC FINDINGS:   EXAM DESCRIPTION: MR LUMBAR SPINE WO CONTRAST   CLINICAL HISTORY: Lumbar radiculopathy, symptoms persist with > 6 wks treatment   COMPARISON: None Available.   TECHNIQUE: MRI of the lumbar spine is performed according to our usual protocol with axial and sagittal multi sequence imaging.   FINDINGS: The alignment is unremarkable. Mild degenerative disc disease throughout. No discogenic endplate marrow edema. The marrow signal is unremarkable as well as the conus and  cauda equina.   L1-2 has mild bilateral foraminal narrowing due to broad-based disc bulge and facet hypertrophy.   L2-3 has a small right foraminal protrusion combined with facet hypertrophy to cause moderate foraminal narrowing with possible contact of the exiting nerve. There is mild to moderate degenerative left-sided foraminal narrowing and mild central stenosis as well due to broad-based disc bulge and facet/ligament flavum  hypertrophy.   L3-4 has mild to moderate bilateral foraminal narrowing greater on the left due to broad-based disc bulge and facet hypertrophy.   L4-5 has mild to moderate bilateral foraminal narrowing and mild bilateral lateral recess narrowing due to broad-based disc bulge and facet/ligament flavum hypertrophy.   L5-S1 has mild to moderate bilateral foraminal narrowing and mild bilateral lateral recess narrowing due to broad-based disc bulge and facet/ligament flavum hypertrophy.   The imaged retroperitoneal structures are unremarkable.     IMPRESSION: Multilevel degenerative spondylosis with levels described in detail above. Multiple levels of mild to moderate degenerative foraminal narrowing.   L2-3 has a small right foraminal protrusion stripping to moderate foraminal narrowing with possible contact of the exiting L2 nerve.  Narrative & Impression CLINICAL DATA:  Low back pain radiating into right leg.   EXAM: LUMBAR SPINE - 2-3 VIEW   COMPARISON:  None Available.   FINDINGS: Five non-rib-bearing lumbar vertebra. Normal lumbar alignment. No evidence of fracture or compression deformity. No visible pars defects or focal bone abnormalities. Mild diffuse disc space narrowing and spurring, most prominently affecting L1-L2 and L2-L3. Mild multilevel facet hypertrophy. Sacroiliac joints are congruent with mild degenerative spurring.   IMPRESSION: Mild multilevel degenerative disc disease and facet hypertrophy.  PATIENT SURVEYS:   Attempted  PSFS, time limited and pt had difficulty identifying items that are isolated to her back and not linked to other medical issues   COGNITION: Overall cognitive status: WNL     SENSATION: Hx peripheral neuropathy   MUSCLE LENGTH: HS Mod limitation B Piriformis mod limitation B   POSTURE: rounded shoulders and forward head    LUMBAR ROM:   AROM eval  Flexion 50% limited tight   Extension 75% limited with hip compensation, REIS   Right lateral flexion WNL tight   Left lateral flexion WNL tight   Right rotation 25%  Left rotation 25%   (Blank rows = not tested)    LOWER EXTREMITY MMT:    MMT Right eval Left eval  Hip flexion 4- 4+  Hip extension    Hip abduction 3 3  Hip adduction    Hip internal rotation    Hip external rotation    Knee flexion 4+ 4+  Knee extension 4- 4  Ankle dorsiflexion    Ankle plantarflexion    Ankle inversion    Ankle eversion     (Blank rows = not tested)   BALANCE   TUG 10 seconds SLS 7 seconds at best R/L Tandem stance 7-26 seconds R/L    TREATMENT DATE:  08/27/23 Bike L3 x 6 min S2S OHP red ball 2x10 Rows and Ext green 2x10 Bridges x10 LE on Pball Bridges, K2C, Oblq  Passive stretching to bilateral HS, piriformis, ITB, K2C  08/25/23  Eval, POC/HEP practice as below  PATIENT EDUCATION:  Education details: exam findings, POC, HEP  Person educated: Patient and Spouse Education method: Explanation, Demonstration, and Handouts Education comprehension: verbalized understanding, returned demonstration, and needs further education  HOME EXERCISE PROGRAM: Access Code: [email or web address] URL: [email or web address] Date: 08/25/2023 Prepared by: Josette Rough  Exercises - Supine Lower Trunk Rotation  - 2 x daily - 7 x weekly - 1 sets - 10 reps - 5 seconds  hold - Supine Figure 4  Piriformis Stretch  - 2 x daily - 7 x weekly - 1 sets - 4 reps - 30 seconds  hold - Supine Bridge  - 1 x daily - 7 x weekly - 2 sets - 10 reps - 1 second  hold - Standing Transverse Abdominis Contraction  - 3 x daily - 7 x weekly - 1 sets - 10 reps - 5 seconds  hold  ASSESSMENT:  CLINICAL IMPRESSION: Patient is a 73 y.o. F who was seen today for physical therapy treatment for Degeneration of intervertebral disc of lumbar region with discogenic back pain and lower extremity pain. Session consisted of posterior chain strengthening. Postural cues required with rows and ext. Some tightness in both ITB. Will make every effort to improve pain and function moving forward.   OBJECTIVE IMPAIRMENTS: Abnormal gait, decreased activity tolerance, decreased balance, decreased mobility, difficulty walking, decreased ROM, decreased strength, increased fascial restrictions, increased muscle spasms, impaired flexibility, improper body mechanics, postural dysfunction, and pain.   ACTIVITY LIMITATIONS: carrying, lifting, sitting, standing, squatting, stairs, transfers, and locomotion level  PARTICIPATION LIMITATIONS: meal prep, cleaning, laundry, driving, shopping, community activity, occupation, and yard work  PERSONAL FACTORS: Age, Behavior pattern, Education, Fitness, Past/current experiences, Social background, and Time since onset of injury/illness/exacerbation are also affecting patient's functional outcome.   REHAB POTENTIAL: Fair chronicity of pain, Sjogrens   CLINICAL DECISION MAKING: Stable/uncomplicated  EVALUATION COMPLEXITY: Low   GOALS: Goals reviewed with patient? No  SHORT TERM GOALS: Target date: 09/15/2023    Will be compliant with appropriate progressive HEP GOAL STATUS: Met 08/27/23   2. Will demonstrate improved postural awareness with all functional tasks, use of ergonomic aides PRN/as desired GOAL STATUS: Initial   3. Will demonstrate good functional biomechanics for bed  mobility and floor to waist lifting mechanics   GOAL STATUS: initial  4. Will be able to maintain SLS for 15 seconds and tandem for at least 30 seconds B GOAL STATUS: INITIAL     LONG TERM GOALS: Target date: 10/06/2023    MMT to have improved by one grade all weak groups GOAL STATUS: Initial  2. Mm flexibility and spasms to have improved by at least 50% in order to improve functional movement patterns and for pain control GOAL STATUS: Initial  3. Pain to be no more than 2/10 with all functional activities GOAL STATUS: Initial   4. Will score at least 20 on DGI to show reduced fall risk    PLAN:  PT FREQUENCY: 2x/week  PT DURATION: 6 weeks  PLANNED INTERVENTIONS: 97750- Physical Performance Testing, 97110-Therapeutic exercises, 97530- Therapeutic activity, [id number]- Neuromuscular re-education, 97535- Self Care, and 02859- Manual therapy.  PLAN FOR NEXT SESSION: core/proximal strength, balance training, postural retraining and biomechanics   Tanda Sorrow, PTA 08/27/23 9:31 AM

## 2023-08-31 DIAGNOSIS — G5793 Unspecified mononeuropathy of bilateral lower limbs: Secondary | ICD-10-CM | POA: Diagnosis not present

## 2023-08-31 DIAGNOSIS — Z6827 Body mass index (BMI) 27.0-27.9, adult: Secondary | ICD-10-CM | POA: Diagnosis not present

## 2023-08-31 DIAGNOSIS — M542 Cervicalgia: Secondary | ICD-10-CM | POA: Diagnosis not present

## 2023-08-31 DIAGNOSIS — D890 Polyclonal hypergammaglobulinemia: Secondary | ICD-10-CM | POA: Diagnosis not present

## 2023-08-31 DIAGNOSIS — J849 Interstitial pulmonary disease, unspecified: Secondary | ICD-10-CM | POA: Diagnosis not present

## 2023-08-31 DIAGNOSIS — M5134 Other intervertebral disc degeneration, thoracic region: Secondary | ICD-10-CM | POA: Diagnosis not present

## 2023-08-31 DIAGNOSIS — M25511 Pain in right shoulder: Secondary | ICD-10-CM | POA: Diagnosis not present

## 2023-08-31 DIAGNOSIS — M3501 Sicca syndrome with keratoconjunctivitis: Secondary | ICD-10-CM | POA: Diagnosis not present

## 2023-08-31 DIAGNOSIS — D638 Anemia in other chronic diseases classified elsewhere: Secondary | ICD-10-CM | POA: Diagnosis not present

## 2023-09-01 ENCOUNTER — Encounter: Payer: Medicare PPO | Admitting: Internal Medicine

## 2023-09-02 ENCOUNTER — Encounter: Payer: Self-pay | Admitting: Dermatology

## 2023-09-02 ENCOUNTER — Ambulatory Visit: Admitting: Dermatology

## 2023-09-02 VITALS — BP 134/82

## 2023-09-02 DIAGNOSIS — L649 Androgenic alopecia, unspecified: Secondary | ICD-10-CM

## 2023-09-02 DIAGNOSIS — L219 Seborrheic dermatitis, unspecified: Secondary | ICD-10-CM | POA: Diagnosis not present

## 2023-09-02 DIAGNOSIS — L7211 Pilar cyst: Secondary | ICD-10-CM

## 2023-09-02 MED ORDER — SAFETY SEAL MISCELLANEOUS MISC: 1.0000 | Freq: Every morning | 3 refills | Status: AC

## 2023-09-02 MED ORDER — CLOBETASOL PROPIONATE 0.05 % EX SOLN: 1.0000 | Freq: Every day | CUTANEOUS | 2 refills | Status: AC

## 2023-09-02 NOTE — Progress Notes (Signed)
   Follow-Up Visit   Subjective  Brandy Medina is a 73 y.o. female who presents for the following: Hair thinning for 8-12 months. She notices more shedding as she combs her hair.Her scalp is itchy. She has Sjogren's Disease. She seems to get a headache when she uses bobby pins.    The following portions of the chart were reviewed this encounter and updated as appropriate: medications, allergies, medical history  Review of Systems:  No other skin or systemic complaints except as noted in HPI or Assessment and Plan.  Objective  Well appearing patient in no apparent distress; mood and affect are within normal limits.   A focused examination was performed of the following areas: Scalp   Relevant exam findings are noted in the Assessment and Plan.                Assessment & Plan   ANDROGENETIC ALOPECIA (FEMALE PATTERN HAIR LOSS) Exam: Diffuse thinning of the crown and widening of the midline part with retention of the frontal hairline  - Assessment: Patient presents with androgenetic alopecia with thinning in the temples and midline part, without clinical signs of scarring. The condition began in her forties and fifties but has become more noticeable recently. This pattern is consistent with androgenetic alopecia, which is influenced by hormonal changes during perimenopause and menopause. The condition is characterized by miniaturization of hair follicles due to increased testosterone and decreased estrogen levels, leading to increased shedding and more visible scalp.  - Plan:    Prescribe topical solution containing 8% minoxidil mixed with finasteride from [facility] pharmacy    Apply to scalp every morning    Educate patient on proper application technique: apply directly to scalp and rub in, minimizing contact with hair    Informed patient it will take 4 months to start working, with potential increased shedding in the first month    Full results expected after one year of  consistent use  2. Seborrheic Dermatitis - Assessment: Patient reports itching of the scalp, which is consistent with seborrheic dermatitis. The scalp appears healthy on examination, but untreated itching can potentially lead to inflammation and darkening of the scalp. - Plan:    Prescribe clobetasol  drops for scalp itching    Educate patient that relief should occur within 10 minutes of application  3. Pilar Cyst - Assessment: Physical examination revealed a small lump on the scalp, initially thought to be a seborrheic keratosis but upon closer inspection identified as a pilar cyst. The cyst is benign and does not appear to be cancerous or dangerous. - Plan:    No intervention required for the pilar cyst at this time    Reassure patient about the benign nature of the cyst    Return for Follow up as scheduled  PIH and alopecia.  I, Roseline Hutchinson, CMA, am acting as scribe for [employer], DO .   Documentation: I have reviewed the above documentation for accuracy and completeness, and I agree with the above.  Delon Lenis, DO

## 2023-09-02 NOTE — Patient Instructions (Addendum)
 Date: Wed Sep 02 2023  Hello Brandy Medina,  Thank you for visiting today. Here is a summary of the key instructions:  - Medications:   - Apply prescription drops (8% minoxidil with finasteride) to scalp every morning   - Use clobetasol  drops for itching as needed  - Hair Care:   - Rub prescription drops into scalp   - Continue regular hair care routine with stylist  - Treatment Areas:   - Expect 4 months before seeing initial results (baby hairs)   - Full results may take up to 1 year   - Possible increased shedding in the first month is normal   - Continue using drops to maintain results  - Follow-up:   - Continue using prescribed treatments   - Monitor for improvement in hair thickness and reduced shedding    Please reach out if you have any questions or concerns.  Warm regards,  Dr. Delon Lenis D[facility]at[facility]     Your provider has sent your prescription to [facility] in [place], [place] . A pharmacy representative will call you to confirm details and take your payment information. If you do not receive a call within 24 hours, please contact the pharmacy at [phone] or [phone]. Your unique skincare compound is being formulated in our lab (most compounds take less than 24 hours). Your prescription is shipped vis USPS to your mailbox (2-4 business days). Priority shipping is available at an additional cost. Once received, you will electronically sign/acknowledge that you received your prescription. The pharmacy hours are Monday-Friday 9 am-6 pm EST and Saturday 9 am-1 pm EST.        Important Information  Due to recent changes in healthcare laws, you may see results of your pathology and/or laboratory studies on MyChart before the doctors have had a chance to review them. We understand that in some cases there may be results that are confusing or concerning to you. Please understand that not all results are received at the same time and often the doctors may  need to interpret multiple results in order to provide you with the best plan of care or course of treatment. Therefore, we ask that you please give us  2 business days to thoroughly review all your results before contacting the office for clarification. Should we see a critical lab result, you will be contacted sooner.   If You Need Anything After Your Visit  If you have any questions or concerns for your doctor, please call our main line at [phone] If no one answers, please leave a voicemail as directed and we will return your call as soon as possible. Messages left after 4 pm will be answered the following business day.   You may also send us  a message via MyChart. We typically respond to MyChart messages within 1-2 business days.  For prescription refills, please ask your pharmacy to contact our office. Our fax number is [phone].  If you have an urgent issue when the clinic is closed that cannot wait until the next business day, you can page your doctor at the number below.    Please note that while we do our best to be available for urgent issues outside of office hours, we are not available 24/7.   If you have an urgent issue and are unable to reach us , you may choose to seek medical care at your doctor's office, retail clinic, urgent care center, or emergency room.  If you have a medical emergency, please immediately call 911 or go  to the emergency department. In the event of inclement weather, please call our main line at [phone] for an update on the status of any delays or closures.  Dermatology Medication Tips: Please keep the boxes that topical medications come in in order to help keep track of the instructions about where and how to use these. Pharmacies typically print the medication instructions only on the boxes and not directly on the medication tubes.   If your medication is too expensive, please contact our office at [phone] or send us  a message through  MyChart.   We are unable to tell what your co-pay for medications will be in advance as this is different depending on your insurance coverage. However, we may be able to find a substitute medication at lower cost or fill out paperwork to get insurance to cover a needed medication.   If a prior authorization is required to get your medication covered by your insurance company, please allow us  1-2 business days to complete this process.  Drug prices often vary depending on where the prescription is filled and some pharmacies may offer cheaper prices.  The website www.goodrx.com contains coupons for medications through different pharmacies. The prices here do not account for what the cost may be with help from insurance (it may be cheaper with your insurance), but the website can give you the price if you did not use any insurance.  - You can print the associated coupon and take it with your prescription to the pharmacy.  - You may also stop by our office during regular business hours and pick up a GoodRx coupon card.  - If you need your prescription sent electronically to a different pharmacy, notify our office through [facility] or by phone at [phone]

## 2023-09-04 ENCOUNTER — Encounter: Payer: Self-pay | Admitting: Physical Therapy

## 2023-09-04 ENCOUNTER — Ambulatory Visit: Attending: Family Medicine | Admitting: Physical Therapy

## 2023-09-04 DIAGNOSIS — R293 Abnormal posture: Secondary | ICD-10-CM | POA: Diagnosis not present

## 2023-09-04 DIAGNOSIS — M5459 Other low back pain: Secondary | ICD-10-CM | POA: Diagnosis not present

## 2023-09-04 DIAGNOSIS — M6281 Muscle weakness (generalized): Secondary | ICD-10-CM | POA: Insufficient documentation

## 2023-09-04 DIAGNOSIS — R29898 Other symptoms and signs involving the musculoskeletal system: Secondary | ICD-10-CM | POA: Insufficient documentation

## 2023-09-04 NOTE — Therapy (Signed)
 OUTPATIENT PHYSICAL THERAPY THORACOLUMBAR EVALUATION   Patient Name: Brandy Medina MRN: [id number] DOB:March 07, 1950, 73 y.o., female Today's Date: 09/04/2023  END OF SESSION:  PT End of Session - 09/04/23 0801     Visit Number 3    Date for PT Re-Evaluation 10/06/23    PT Start Time 0800    PT Stop Time 0845    PT Time Calculation (min) 45 min    Activity Tolerance Patient tolerated treatment well    Behavior During Therapy WFL for tasks assessed/performed          Past Medical History:  Diagnosis Date   Allergic rhinitis    Diabetes (HCC)    HTN (hypertension)    Migraines    PONV (postoperative nausea and vomiting)    Pulmonary fibrosis (HCC)    Past Surgical History:  Procedure Laterality Date   ABDOMINAL HYSTERECTOMY  1989   partial   BIOPSY  06/01/2020   Procedure: BIOPSY;  Surgeon: Saintclair Jasper, MD;  Location: [facility];  Service: Gastroenterology;;  EGD and COLON   BIOPSY  06/04/2021   Procedure: BIOPSY;  Surgeon: Saintclair Jasper, MD;  Location: [facility];  Service: Gastroenterology;;   BREAST SURGERY     COLONOSCOPY WITH PROPOFOL  N/A 06/01/2020   Procedure: COLONOSCOPY WITH PROPOFOL ;  Surgeon: Saintclair Jasper, MD;  Location: [facility];  Service: Gastroenterology;  Laterality: N/A;   ESOPHAGOGASTRODUODENOSCOPY N/A 06/04/2021   Procedure: ESOPHAGOGASTRODUODENOSCOPY (EGD);  Surgeon: Saintclair Jasper, MD;  Location: [facility];  Service: Gastroenterology;  Laterality: N/A;   ESOPHAGOGASTRODUODENOSCOPY (EGD) WITH PROPOFOL  N/A 06/01/2020   Procedure: ESOPHAGOGASTRODUODENOSCOPY (EGD) WITH PROPOFOL ;  Surgeon: Saintclair Jasper, MD;  Location: [facility];  Service: Gastroenterology;  Laterality: N/A;   POLYPECTOMY  06/01/2020   Procedure: POLYPECTOMY;  Surgeon: Saintclair Jasper, MD;  Location: [facility];  Service: Gastroenterology;;   SALPINGOOPHORECTOMY Left    Patient Active Problem List   Diagnosis Date Noted   DDD (degenerative disc disease), thoracic 08/19/2023   Neck pain  08/19/2023   Neuropathy of both feet 08/19/2023   Body mass index (BMI) 38.0-38.9, adult 08/19/2023   Obesity (BMI 30-39.9) 08/19/2023   Hyperpigmentation 05/10/2023   Hair thinning 05/10/2023   DM cataract (HCC) 05/10/2023   Renal insufficiency 05/10/2023   Herpes zoster without complication 03/04/2023   Aortic atherosclerosis (HCC) 02/15/2023   Paresthesia of lower extremity 02/15/2023   Pulmonary fibrosis (HCC) 02/15/2023   Estrogen deficiency 02/03/2023   Pain in left foot 11/07/2022   Plantar fasciitis of left foot 11/07/2022   Chronic rhinitis 02/05/2022   Recurrent infections 02/05/2022   Dietary counseling and surveillance 02/05/2022   Anemia of infection and chronic disease 07/23/2021   Pain of lumbar spine 02/26/2021   Normocytic anemia 04/17/2020   ILD (interstitial lung disease) (HCC) 02/27/2020   Sjogren's syndrome (HCC) 02/27/2020   Abnormal findings on diagnostic imaging of lung 02/27/2020   Abnormal ECG 12/12/2015   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   Essential hypertension 07/07/2014   Upper airway cough syndrome 07/06/2014   Obstructive bronchiectasis (HCC) 06/20/2009   Nonspecific (abnormal) findings on radiological and other examination of body structure 08/02/2008   COMPUTERIZED TOMOGRAPHY, CHEST, ABNORMAL 08/02/2008   Hypoglycemia 08/01/2008   MIGRAINE HEADACHE 08/01/2008   Sinusitis, chronic 08/01/2008   GERD 08/01/2008   ABDOMINAL PAIN, CHRONIC 08/01/2008   ANEMIA, IRON DEFICIENCY, HX OF 08/01/2008   DIVERTICULOSIS, COLON, HX OF 08/01/2008    PCP: Jarold Medici MD   REFERRING PROVIDER: Teressa Rainell BROCKS, DO  REFERRING DIAG:  Diagnosis  M51.362 (ICD-10-CM) - Degeneration of intervertebral disc of lumbar region with discogenic back pain and lower extremity pain    Rationale for Evaluation and Treatment: Rehabilitation  THERAPY DIAG:  Other low back pain  Muscle weakness (generalized)  Other symptoms  and signs involving the musculoskeletal system  Abnormal posture  ONSET DATE: 2022 for back pain, stabbing in thigh is more recent  SUBJECTIVE:                                                                                                                                                                                           SUBJECTIVE STATEMENT: Doing ok, fine after last session   I have a lot going on in my back from the images, there was one morning where I had a big stab in my right thigh that lasted for 5 min if that but then I could barely walk. No numbness in the leg but do have neuropathy. No treatments for the back since 2022 besides [facility] which really didn't help. Have some balance concerns.   PERTINENT HISTORY:  See above   PAIN:  Are you having pain? 0/10  PRECAUTIONS: None  RED FLAGS: None   WEIGHT BEARING RESTRICTIONS: No  FALLS:  Has patient fallen in last 6 months? No  LIVING ENVIRONMENT: Lives with: lives with their spouse Lives in: House/apartment   OCCUPATION: retired- taught school   PLOF: Independent, Independent with basic ADLs, Independent with gait, and Independent with transfers  PATIENT GOALS: get rid of aching, willing to get up and move a little more often if that'll help   NEXT MD VISIT: referring PRN   OBJECTIVE:  Note: Objective measures were completed at Evaluation unless otherwise noted.  DIAGNOSTIC FINDINGS:   EXAM DESCRIPTION: MR LUMBAR SPINE WO CONTRAST   CLINICAL HISTORY: Lumbar radiculopathy, symptoms persist with > 6 wks treatment   COMPARISON: None Available.   TECHNIQUE: MRI of the lumbar spine is performed according to our usual protocol with axial and sagittal multi sequence imaging.   FINDINGS: The alignment is unremarkable. Mild degenerative disc disease throughout. No discogenic endplate marrow edema. The marrow signal is unremarkable as well as the conus and cauda equina.   L1-2 has mild  bilateral foraminal narrowing due to broad-based disc bulge and facet hypertrophy.   L2-3 has a small right foraminal protrusion combined with facet hypertrophy to cause moderate foraminal narrowing with possible contact of the exiting nerve. There is mild to moderate degenerative left-sided foraminal narrowing and mild central stenosis as well due to broad-based disc bulge and facet/ligament flavum hypertrophy.   L3-4  has mild to moderate bilateral foraminal narrowing greater on the left due to broad-based disc bulge and facet hypertrophy.   L4-5 has mild to moderate bilateral foraminal narrowing and mild bilateral lateral recess narrowing due to broad-based disc bulge and facet/ligament flavum hypertrophy.   L5-S1 has mild to moderate bilateral foraminal narrowing and mild bilateral lateral recess narrowing due to broad-based disc bulge and facet/ligament flavum hypertrophy.   The imaged retroperitoneal structures are unremarkable.     IMPRESSION: Multilevel degenerative spondylosis with levels described in detail above. Multiple levels of mild to moderate degenerative foraminal narrowing.   L2-3 has a small right foraminal protrusion stripping to moderate foraminal narrowing with possible contact of the exiting L2 nerve.  Narrative & Impression CLINICAL DATA:  Low back pain radiating into right leg.   EXAM: LUMBAR SPINE - 2-3 VIEW   COMPARISON:  None Available.   FINDINGS: Five non-rib-bearing lumbar vertebra. Normal lumbar alignment. No evidence of fracture or compression deformity. No visible pars defects or focal bone abnormalities. Mild diffuse disc space narrowing and spurring, most prominently affecting L1-L2 and L2-L3. Mild multilevel facet hypertrophy. Sacroiliac joints are congruent with mild degenerative spurring.   IMPRESSION: Mild multilevel degenerative disc disease and facet hypertrophy.  PATIENT SURVEYS:   Attempted PSFS, time limited and pt had  difficulty identifying items that are isolated to her back and not linked to other medical issues   COGNITION: Overall cognitive status: WNL     SENSATION: Hx peripheral neuropathy   MUSCLE LENGTH: HS Mod limitation B Piriformis mod limitation B   POSTURE: rounded shoulders and forward head    LUMBAR ROM:   AROM eval 09/04/23  Flexion 50% limited tight  Limited 25%  Extension 75% limited with hip compensation, REIS  Limited 25%  Right lateral flexion WNL tight  WNL  Left lateral flexion WNL tight  WNL  Right rotation 25% WNL  Left rotation 25% WNL   (Blank rows = not tested)    LOWER EXTREMITY MMT:    MMT Right eval Left eval  Hip flexion 4- 4+  Hip extension    Hip abduction 3 3  Hip adduction    Hip internal rotation    Hip external rotation    Knee flexion 4+ 4+  Knee extension 4- 4  Ankle dorsiflexion    Ankle plantarflexion    Ankle inversion    Ankle eversion     (Blank rows = not tested)   BALANCE   TUG 10 seconds SLS 7 seconds at best R/L Tandem stance 7-26 seconds R/L    TREATMENT DATE:  09/04/23 NuStep L 5 x 6 min Shoulder Ext 5lb 2x10  HS curls 20lb 2x10 Leg Ext 5lb 2x10 Rows and Ext green 2x10 Bridges  Passive stretching to bilateral HS, piriformis, ITB, K2C  08/27/23 Bike L3 x 6 min S2S OHP red ball 2x10 Rows and Ext green 2x10 Bridges x10 LE on [place]ball Bridges, K2C, Oblq  Passive stretching to bilateral HS, piriformis, ITB, K2C  08/25/23  Eval, POC/HEP practice as below  PATIENT EDUCATION:  Education details: exam findings, POC, HEP  Person educated: Patient and Spouse Education method: Explanation, Demonstration, and Handouts Education comprehension: verbalized understanding, returned demonstration, and needs further education  HOME EXERCISE PROGRAM: Access Code: [email or web address] URL:  [email or web address] Date: 08/25/2023 Prepared by: Josette Rough  Exercises - Supine Lower Trunk Rotation  - 2 x daily - 7 x weekly - 1 sets - 10 reps - 5 seconds  hold - Supine Figure 4 Piriformis Stretch  - 2 x daily - 7 x weekly - 1 sets - 4 reps - 30 seconds  hold - Supine Bridge  - 1 x daily - 7 x weekly - 2 sets - 10 reps - 1 second  hold - Standing Transverse Abdominis Contraction  - 3 x daily - 7 x weekly - 1 sets - 10 reps - 5 seconds  hold  ASSESSMENT:  CLINICAL IMPRESSION: Patient is a 73 y.o. F who was seen today for physical therapy treatment for Degeneration of intervertebral disc of lumbar region with discogenic back pain and lower extremity pain. She has progressed increasing her lumbar AROM. Session consisted of posterior chain strengthening. Postural cues required with rows and ext. Some tightness in both ITB. Tightness also verbalized with K2C  stretch. Will make every effort to improve pain and function moving forward.   OBJECTIVE IMPAIRMENTS: Abnormal gait, decreased activity tolerance, decreased balance, decreased mobility, difficulty walking, decreased ROM, decreased strength, increased fascial restrictions, increased muscle spasms, impaired flexibility, improper body mechanics, postural dysfunction, and pain.   ACTIVITY LIMITATIONS: carrying, lifting, sitting, standing, squatting, stairs, transfers, and locomotion level  PARTICIPATION LIMITATIONS: meal prep, cleaning, laundry, driving, shopping, community activity, occupation, and yard work  PERSONAL FACTORS: Age, Behavior pattern, Education, Fitness, Past/current experiences, Social background, and Time since onset of injury/illness/exacerbation are also affecting patient's functional outcome.   REHAB POTENTIAL: Fair chronicity of pain, Sjogrens   CLINICAL DECISION MAKING: Stable/uncomplicated  EVALUATION COMPLEXITY: Low   GOALS: Goals reviewed with patient? No  SHORT TERM GOALS: Target date:  09/15/2023    Will be compliant with appropriate progressive HEP GOAL STATUS: Met 08/27/23   2. Will demonstrate improved postural awareness with all functional tasks, use of ergonomic aides PRN/as desired GOAL STATUS: Progressing 09/04/23   3. Will demonstrate good functional biomechanics for bed mobility and floor to waist lifting mechanics   GOAL STATUS: Met 09/04/23  4. Will be able to maintain SLS for 15 seconds and tandem for at least 30 seconds B GOAL STATUS: INITIAL     LONG TERM GOALS: Target date: 10/06/2023    MMT to have improved by one grade all weak groups GOAL STATUS: Initial  2. Mm flexibility and spasms to have improved by at least 50% in order to improve functional movement patterns and for pain control GOAL STATUS: Progressing 09/04/23  3. Pain to be no more than 2/10 with all functional activities GOAL STATUS: Initial   4. Will score at least 20 on DGI to show reduced fall risk    PLAN:  PT FREQUENCY: 2x/week  PT DURATION: 6 weeks  PLANNED INTERVENTIONS: 97750- Physical Performance Testing, 97110-Therapeutic exercises, 97530- Therapeutic activity, [id number]- Neuromuscular re-education, 97535- Self Care, and [id number]- Manual therapy.  PLAN FOR NEXT SESSION: core/proximal strength, balance training, postural retraining and biomechanics   Tanda Sorrow, PTA 09/04/23 8:02 AM

## 2023-09-07 ENCOUNTER — Encounter: Payer: Self-pay | Admitting: Pulmonary Disease

## 2023-09-08 ENCOUNTER — Ambulatory Visit: Admitting: Physical Therapy

## 2023-09-08 ENCOUNTER — Ambulatory Visit (INDEPENDENT_AMBULATORY_CARE_PROVIDER_SITE_OTHER): Admitting: Otolaryngology

## 2023-09-08 ENCOUNTER — Encounter (INDEPENDENT_AMBULATORY_CARE_PROVIDER_SITE_OTHER): Payer: Self-pay | Admitting: Otolaryngology

## 2023-09-08 VITALS — BP 136/83 | HR 100

## 2023-09-08 DIAGNOSIS — R040 Epistaxis: Secondary | ICD-10-CM | POA: Diagnosis not present

## 2023-09-08 NOTE — Telephone Encounter (Signed)
**Note De-identified  [place] Obfuscation** Please advise 

## 2023-09-09 MED ORDER — PREDNISONE 10 MG PO TABS
40.0000 mg | ORAL_TABLET | Freq: Every day | ORAL | 0 refills | Status: AC
Start: 2023-09-09 — End: 2023-09-14

## 2023-09-09 MED ORDER — AZITHROMYCIN 250 MG PO TABS
ORAL_TABLET | ORAL | 0 refills | Status: DC
Start: 2023-09-09 — End: 2023-09-21

## 2023-09-09 NOTE — Telephone Encounter (Signed)
 Please advise if okay not to do prednisone 

## 2023-09-10 ENCOUNTER — Ambulatory Visit: Admitting: Physical Therapy

## 2023-09-10 ENCOUNTER — Ambulatory Visit: Admitting: Hematology and Oncology

## 2023-09-10 DIAGNOSIS — R040 Epistaxis: Secondary | ICD-10-CM | POA: Insufficient documentation

## 2023-09-10 NOTE — Progress Notes (Signed)
 Procedure:  Endoscopic control of recurrent left epistaxis  Indication: The patient is a 73 year old female who presents today complaining of recurrent left epistaxis over the past 2 days.  The patient previously underwent successful left-sided nasal cautery in 2022.  She was doing well until 2 days ago, when she started experiencing multiple left-sided epistaxis.  She is not on any blood thinner.  She denies any recent nasal trauma.  Description:  The left nasal cavity is sprayed with topical xylocaine  and neo-synephrine.  After adequate anesthesia is achieved, the nasal cavity is examined with a 0 rigid endoscope.  A suction catheter is inserted in parallel with the 0 endoscope, and it is used to suction blood clots from the left nasal cavity.  Hypervascular areas are noted at the anterior and superior aspect of the nasal septum.  A silver nitrate stick is inserted in parallel with the 0 endoscope.  It is used to repeatedly cauterized the hypervascular areas.  Good hemostasis is achieved.  The patient tolerated the procedure well.  Assessment: 1.  Hypervascular areas are noted on the left anterior and superior nasal septum.   2.  Active bleeding is noted today.    Plan: 1. Endoscopic cauterization of the left anterior and superior nasal septum. 2. The nasal endoscopy findings are reviewed with the patient. 3. Nasal ointment/humidifier as needed. 4. The patient will return for re-evaluation in 1 month.

## 2023-09-15 ENCOUNTER — Ambulatory Visit: Admitting: "Endocrinology

## 2023-09-15 ENCOUNTER — Encounter: Payer: Self-pay | Admitting: "Endocrinology

## 2023-09-15 VITALS — BP 100/80 | Ht 64.0 in | Wt 158.0 lb

## 2023-09-15 DIAGNOSIS — E162 Hypoglycemia, unspecified: Secondary | ICD-10-CM

## 2023-09-15 DIAGNOSIS — E11649 Type 2 diabetes mellitus with hypoglycemia without coma: Secondary | ICD-10-CM | POA: Diagnosis not present

## 2023-09-15 MED ORDER — BAQSIMI ONE PACK 3 MG/DOSE NA POWD: 1.0000 | NASAL | 3 refills | Status: AC | PRN

## 2023-09-15 MED ORDER — FREESTYLE LIBRE 3 PLUS SENSOR MISC
3 refills | Status: DC
Start: 2023-09-15 — End: 2023-10-19

## 2023-09-15 NOTE — Patient Instructions (Signed)
 10-Point Nutrition Plan for Preventing Hypoglycemia  Control portions of carbohydrate - 30 grams/meal, 15 grams/snack. Choose low-glycemic carbohydrates. Avoid high-glycemic carbohydrates. Include (heart-healthy) fats in each meal or snack - 15 grams/meal, 5 grams/snack. Emphasize optimal protein intake. Space meals/snacks 3-4 hours apart. Avoid consuming liquids with meals. Avoid alcohol. Avoid caffeine . Maintain post-bariatric vitamin and mineral intake.    Low Glycemic Index Carbohydrates (CHOOSE) Steel-cut oats (regular, not quick-cook or instant) Oat bran cereal Beans/legumes (e.g., garbanzo, navy, kidney, lima, pinto, black-eyed and pea beans, edamame (soybeans), lentils Bean products (e.g., hummus, tofu) Pearled barley, cooked al dente Yams Some fruits (e.g., grapefruit, apples, pears, berries, apricots, peaches) Some pasta (e.g., Barilla Plus pasta), cooked al dente Some whole grain breads (e.g., Ezekiel bread, Joseph's Flax, Oat Bran & Whole Wheat Pita/Lavash/Tortillas) Some whole grain crackers (e.g., RyKrisp, RyVita, [place]) Brown rice, wild rice Quinoa  Buckwheat (a grass)   High Glycemic Index Carbohydrates (AVOID) Refined breakfast cereals (e.g., Corn Flakes, Rice Krispies, Cream of Rice, instant oatmeal) Regular pasta Most starchy vegetables (e.g., white potatoes, corn, winter (orange) squash) White rice, rice cakes Popcorn, pretzels, chips Some fruits (e.g., ripe bananas, pineapple, mango, watermelon, grapes) All fruit juices and sweetened drinks (e.g., sodas, sweetened iced tea) Bread, rolls, bagels, English muffins, and crackers made with refined flour Sweets (e.g., candy, cake, cookies, ice cream, syrup)   Heart-Healthy Fats (Adapt) Nuts, nut butters Avocado, guacamole Olives Most plant oils (e.g., olive, canola, peanut, soy, sunflower, sesame) Most seeds (e.g., sunflower, flax, sesame/sesame tahini) Oily fish (e.g., salmon, bluefish, mackerel, tuna,  sardines)   __________________  Take a 15 gm snack of carbohydrate at bedtime before you go to sleep if your blood sugar is less than 100.  If you are going to fast after midnight for a test or procedure, ask your physician for instructions on how to reduce/decrease your insulin  dose.  Call if blood sugar is less than 70 or consistently above 250  -Treating a low sugar by rule of 15  (15 gms of sugar every 15 min until sugar is more than 70) If you feel your sugar is low, test your sugar to be sure If your sugar is low (less than 70), then take 15 grams of a fast acting Carbohydrate (3-4 glucose tablets or glucose gel or 4 ounces of juice or regular soda) Recheck your sugar 15 min after treating low to make sure it is more than 70 If sugar is still less than 70, treat again with 15 grams of carbohydrate                Don't drive the hour of hypoglycemia  If unconscious/unable to eat or drink by mouth, use glucagon  injection or nasal spray baqsimi  and call 911. Can repeat again in 15 min if still unconscious.  Call your doctor if blood sugar is less than 70 or consistently above 250

## 2023-09-15 NOTE — Progress Notes (Signed)
 Outpatient Endocrinology Note Obadiah Birmingham, MD    Brandy Medina [birth year] [id number]  Referring Provider: Jarold Medici, MD Primary Care Provider: Jarold Medici, MD Reason for consultation: Subjective   Assessment & Plan  Diagnoses and all orders for this visit:  Hypoglycemia -     Ambulatory referral to diabetic education -     Continuous Glucose Sensor (FREESTYLE LIBRE 3 PLUS SENSOR) MISC; Change sensor every 15 days.  Uncontrolled type 2 diabetes mellitus with hypoglycemia without coma (HCC) -     Ambulatory referral to diabetic education -     Continuous Glucose Sensor (FREESTYLE LIBRE 3 PLUS SENSOR) MISC; Change sensor every 15 days.  Other orders -     Glucagon  (BAQSIMI  ONE PACK) 3 MG/DOSE POWD; Place 1 Device into the nose as needed (Low blood sugar with impaired consciousness).   Hypoglycemia induced by carbs/heavy carb intake Patient has diabetes with A1c 6.5 x 2, not on any diabetes medication/supplement that can cause low Could not tolerate Jardiance  due to worsening low blood sugar Has blood glucose meter that she uses to check her blood sugar once or twice a day Recommend high-protein, low carb diet Discussed carb counting, given handout as well as ordered diabetes education Discussed various protein sources to improve protein intake to avoid hypoglycemia Patient understands and agreed Ordered CGM and baqsimi , discussed their use   Return in about 3 months (around 12/16/2023).   I have reviewed current medications, nurse's notes, allergies, vital signs, past medical and surgical history, family medical history, and social history for this encounter. Counseled patient on symptoms, examination findings, lab findings, imaging results, treatment decisions and monitoring and prognosis. The patient understood the recommendations and agrees with the treatment plan. All questions regarding treatment plan were fully answered.  Obadiah Birmingham, MD   09/15/23   History of Present Illness HPI  Brandy Medina is a 73 y.o. year old female who presents for evaluation of hypoglycemia diagnosed in 73s managed by diet.  Recently she was put on jardiance  for renal protection that led to hypoglycemia of 61.   Uses blood glucose meter fasting+-at bedtime   Has a 73 blood sugar last week  A1C is in pre-diabetes range  Gets nervous stomach and sweating sometimes with lows, other times may not feel it  Has history of renal insufficiency  -The patient's medication and drug history was reviewed carefully for potential causes of hypoglycemia. -No history of insulin  usage or ingestion of an oral hypoglycemic agent -No history of alcoholism -No history of  hepatic cirrhosis/failure, -No history of other endocrine diseases -History of total gastrectomy + for stomach cancer -History of cardiac arrythmia  -No history of endocrine disorders (eg, pheochromocytoma, Addison disease, glucagon  deficiency, carcinomas, extrahepatic tumors) -No history of substance abuse (eg, cocaine, ethanol, salicylates, beta-blockers, pentamidine) -No history of nutritional disorders (eg, prolonged starvation before anesthesia, protein calorie malnutrition, L-leucine-sensitive hypoglycemic defect in children, low-calorie ketogenic diet, renal disease)   Physical Exam  BP 100/80   Ht 5' 4 (1.626 m)   Wt 158 lb (71.7 kg)   BMI 27.12 kg/m    Constitutional: well developed, well nourished Head: normocephalic, atraumatic Eyes: sclera anicteric, no redness Neck: supple Lungs: normal respiratory effort Neurology: alert and oriented Skin: dry, no appreciable rashes Musculoskeletal: no appreciable defects Psychiatric: normal mood and affect   Current Medications Patient's Medications  New Prescriptions   CONTINUOUS GLUCOSE SENSOR (FREESTYLE LIBRE 3 PLUS SENSOR) MISC    Change sensor every 15  days.   GLUCAGON  (BAQSIMI  ONE PACK) 3 MG/DOSE POWD    Place 1  Device into the nose as needed (Low blood sugar with impaired consciousness).  Previous Medications   ACETAMINOPHEN  (TYLENOL ) 500 MG TABLET    Take 500-1,000 mg by mouth every 6 (six) hours as needed for moderate pain or headache.   AMLODIPINE  (NORVASC ) 2.5 MG TABLET    Take 1 tablet (2.5 mg total) by mouth daily.   AZITHROMYCIN  (ZITHROMAX ) 250 MG TABLET    Take 500 mg on day 1 and then 250 mg/day for the next 4 days   BUTALBITAL -ACETAMINOPHEN -CAFFEINE  (FIORICET) 50-325-40 MG TABLET    Take 1 tablet by mouth every 6 (six) hours as needed for migraine.   CHLORPHENIRAMINE (CHLOR-TRIMETON) 4 MG TABLET    Take 4 mg by mouth every 4 (four) hours as needed for allergies.   CHOLECALCIFEROL (VITAMIN D) 50 MCG (2000 UT) TABLET    Take 1,000 Units by mouth daily. Tues and Thurs   CLOBETASOL  (TEMOVATE ) 0.05 % EXTERNAL SOLUTION    Apply 1 Application topically daily. Apply to scalp daily as needed for itch.   DEXTROMETHORPHAN (DELSYM) 30 MG/5ML LIQUID    Take 30 mg by mouth 2 (two) times daily as needed for cough.   EPOETIN  ALFA (EPOGEN ) 10000 UNIT/ML INJECTION    as directed Injection once every 3 weeks for 3 months   GLUCOSE BLOOD TEST STRIP    Use as instructed   HOMEOPATHIC PRODUCTS (ARNICARE PAIN RELIEF EX)    Apply 1 application. topically daily as needed (pain).   RESPIRATORY THERAPY SUPPLIES (FLUTTER) DEVI    Use as directed.   ROSUVASTATIN  (CRESTOR ) 5 MG TABLET    Take 1 tablet (5 mg total) by mouth every Tuesday.   SAFETY SEAL MISCELLANEOUS MISC    melaxemic cream - tranexamic acid 5% kojic acid 2% vit c 2.5% hyaluronic acid 0.1% - apply to affected areas BID morning and night.   SAFETY SEAL MISCELLANEOUS MISC    Apply 1 application  topically every morning. Medication Name: Hormonic Hair Apply to scalp daily in the morning.   VALSARTAN  (DIOVAN ) 320 MG TABLET    Take 1 tablet (320 mg total) by mouth daily.   WHEAT DEXTRIN (BENEFIBER PO)    Take 1 Scoop by mouth daily.  Modified Medications   No  medications on file  Discontinued Medications   No medications on file    Allergies Allergies  Allergen Reactions   Avelox [Moxifloxacin Hcl In Nacl] Other (See Comments)    confusion   Avelox [Moxifloxacin] Other (See Comments)   Azathioprine  Other (See Comments)   Codeine Nausea Only and Other (See Comments)   Erythromycin     GI upset   Fexofenadine Other (See Comments)   Levocetirizine Other (See Comments)   Lisinopril     Headache   Sulfa Antibiotics Other (See Comments)   Sulfonamide Derivatives     headache   Zyrtec [Cetirizine] Other (See Comments)    Past Medical History Past Medical History:  Diagnosis Date   Allergic rhinitis    Diabetes (HCC)    HTN (hypertension)    Migraines    PONV (postoperative nausea and vomiting)    Pulmonary fibrosis (HCC)     Past Surgical History Past Surgical History:  Procedure Laterality Date   ABDOMINAL HYSTERECTOMY  1989   partial   BIOPSY  06/01/2020   Procedure: BIOPSY;  Surgeon: Saintclair Jasper, MD;  Location: [facility];  Service: Gastroenterology;;  EGD  and COLON   BIOPSY  06/04/2021   Procedure: BIOPSY;  Surgeon: Saintclair Jasper, MD;  Location: [facility];  Service: Gastroenterology;;   BREAST SURGERY     COLONOSCOPY WITH PROPOFOL  N/A 06/01/2020   Procedure: COLONOSCOPY WITH PROPOFOL ;  Surgeon: Saintclair Jasper, MD;  Location: [facility];  Service: Gastroenterology;  Laterality: N/A;   ESOPHAGOGASTRODUODENOSCOPY N/A 06/04/2021   Procedure: ESOPHAGOGASTRODUODENOSCOPY (EGD);  Surgeon: Saintclair Jasper, MD;  Location: [facility];  Service: Gastroenterology;  Laterality: N/A;   ESOPHAGOGASTRODUODENOSCOPY (EGD) WITH PROPOFOL  N/A 06/01/2020   Procedure: ESOPHAGOGASTRODUODENOSCOPY (EGD) WITH PROPOFOL ;  Surgeon: Saintclair Jasper, MD;  Location: [facility];  Service: Gastroenterology;  Laterality: N/A;   POLYPECTOMY  06/01/2020   Procedure: POLYPECTOMY;  Surgeon: Saintclair Jasper, MD;  Location: [facility];  Service: Gastroenterology;;    SALPINGOOPHORECTOMY Left     Family History family history includes Bronchitis in her mother; Diabetes in her sister and sister; Heart attack in her brother and father; Heart disease in her father and mother; Hypertension in her sister and sister; Renal Disease in her brother; Stroke in her brother.  Social History Social History   Socioeconomic History   Marital status: Married    Spouse name: Not on file   Number of children: Not on file   Years of education: Not on file   Highest education level: Not on file  Occupational History   Not on file  Tobacco Use   Smoking status: Former    Current packs/day: 0.00    Average packs/day: 0.5 packs/day for 18.0 years (9.0 ttl pk-yrs)    Types: Cigarettes    Start date: 07/06/1975    Quit date: 07/05/1993    Years since quitting: 30.2   Smokeless tobacco: Never  Substance and Sexual Activity   Alcohol use: Yes    Comment: Occasional Wine or Martini   Drug use: Never   Sexual activity: Not Currently  Other Topics Concern   Not on file  Social History Narrative   Are you right handed or left handed? Right ended   Are you currently employed ? No   What is your current occupation? Retired   Do you live at home alone? No    Who lives with you?  Lives with husband    What type of home do you live in: 1 story or 2 story? Lives in a two story home.        Social Drivers of Corporate investment banker Strain: Low Risk  (04/24/2023)   Overall Financial Resource Strain (CARDIA)    Difficulty of Paying Living Expenses: Not hard at all  Food Insecurity: No Food Insecurity (04/24/2023)   Hunger Vital Sign    Worried About Running Out of Food in the Last Year: Never true    Ran Out of Food in the Last Year: Never true  Transportation Needs: No Transportation Needs (04/24/2023)   PRAPARE - Administrator, Civil Service (Medical): No    Lack of Transportation (Non-Medical): No  Physical Activity: Insufficiently Active (04/24/2023)    Exercise Vital Sign    Days of Exercise per Week: 2 days    Minutes of Exercise per Session: 30 min  Stress: No Stress Concern Present (04/24/2023)   [employer] of Occupational Health - Occupational Stress Questionnaire    Feeling of Stress : Not at all  Social Connections: Socially Integrated (04/24/2023)   Social Connection and Isolation Panel    Frequency of Communication with Friends and Family: More than three  times a week    Frequency of Social Gatherings with Friends and Family: Three times a week    Attends Religious Services: More than 4 times per year    Active Member of Clubs or Organizations: Yes    Attends Club or Organization Meetings: 1 to 4 times per year    Marital Status: Married  Intimate Partner Violence: Not At Risk (04/24/2023)   Humiliation, Afraid, Rape, and Kick questionnaire    Fear of Current or Ex-Partner: No    Emotionally Abused: No    Physically Abused: No    Sexually Abused: No    Lab Results  Component Value Date   CHOL 194 02/03/2023   Lab Results  Component Value Date   HDL 94 02/03/2023   Lab Results  Component Value Date   LDLCALC 87 02/03/2023   Lab Results  Component Value Date   TRIG 69 02/03/2023   Lab Results  Component Value Date   CHOLHDL 2.1 02/03/2023   Lab Results  Component Value Date   CREATININE 0.90 08/10/2023   Lab Results  Component Value Date   GFR 52.93 (L) 05/14/2020      Component Value Date/Time   NA 135 08/10/2023 0817   NA 134 02/03/2023 1537   NA 133 (L) 11/20/2015 1608   K 4.1 08/10/2023 0817   K 4.2 11/20/2015 1608   CL 103 08/10/2023 0817   CO2 28 08/10/2023 0817   CO2 24 11/20/2015 1608   GLUCOSE 91 08/10/2023 0817   GLUCOSE 90 11/20/2015 1608   BUN 23 08/10/2023 0817   BUN 17 02/03/2023 1537   BUN 25.3 11/20/2015 1608   CREATININE 0.90 08/10/2023 0817   CREATININE 1.0 11/20/2015 1608   CALCIUM  9.7 08/10/2023 0817   CALCIUM  10.5 (H) 11/20/2015 1608   PROT 8.9 (H) 08/10/2023 0817    PROT 9.1 (H) 05/05/2023 1620   PROT 10.3 (H) 11/20/2015 1608   ALBUMIN 3.8 08/10/2023 0817   ALBUMIN 4.1 02/03/2023 1537   ALBUMIN 3.6 11/20/2015 1608   AST 22 08/10/2023 0817   AST 19 11/20/2015 1608   ALT 15 08/10/2023 0817   ALT 13 11/20/2015 1608   ALKPHOS 67 08/10/2023 0817   ALKPHOS 98 11/20/2015 1608   BILITOT 0.4 08/10/2023 0817   BILITOT 0.33 11/20/2015 1608   GFRNONAA >60 08/10/2023 0817      Latest Ref Rng & Units 08/10/2023    8:17 AM 04/30/2023    6:15 PM 02/05/2023    7:36 AM  BMP  Glucose 70 - 99 mg/dL 91  91  97   BUN 8 - 23 mg/dL 23  30  27    Creatinine 0.44 - 1.00 mg/dL 9.09  8.97  8.99   Sodium 135 - 145 mmol/L 135  132  135   Potassium 3.5 - 5.1 mmol/L 4.1  5.1  4.4   Chloride 98 - 111 mmol/L 103  100  103   CO2 22 - 32 mmol/L 28  25  27    Calcium  8.9 - 10.3 mg/dL 9.7  9.0  9.7        Component Value Date/Time   WBC 3.5 (L) 08/10/2023 0817   WBC 4.4 04/30/2023 1815   RBC 3.18 (L) 08/10/2023 0817   HGB 10.0 (L) 08/10/2023 0817   HGB 9.8 (L) 02/05/2022 1633   HGB 9.5 (L) 11/20/2015 1608   HCT 29.9 (L) 08/10/2023 0817   HCT 29.5 (L) 02/05/2022 1633   HCT 27.7 (L) 11/20/2015 1608  PLT 211 08/10/2023 0817   PLT 150 02/05/2022 1633   MCV 94.0 08/10/2023 0817   MCV 93 02/05/2022 1633   MCV 92.3 11/20/2015 1608   MCH 31.4 08/10/2023 0817   MCHC 33.4 08/10/2023 0817   RDW 14.4 08/10/2023 0817   RDW 15.1 02/05/2022 1633   RDW 14.3 11/20/2015 1608   LYMPHSABS 1.0 08/10/2023 0817   LYMPHSABS 0.9 02/05/2022 1633   LYMPHSABS 1.0 11/20/2015 1608   MONOABS 0.6 08/10/2023 0817   MONOABS 0.4 11/20/2015 1608   EOSABS 0.1 08/10/2023 0817   EOSABS 0.0 02/05/2022 1633   BASOSABS 0.0 08/10/2023 0817   BASOSABS 0.0 02/05/2022 1633   BASOSABS 0.0 11/20/2015 1608   Lab Results  Component Value Date   TSH 1.880 02/03/2023   TSH 1.95 03/27/2008         Parts of this note may have been dictated using voice recognition software. There may be variances in  spelling and vocabulary which are unintentional. Not all errors are proofread. Please notify the dino if any discrepancies are noted or if the meaning of any statement is not clear.

## 2023-09-16 ENCOUNTER — Ambulatory Visit: Admitting: Physical Therapy

## 2023-09-16 ENCOUNTER — Encounter: Payer: Self-pay | Admitting: Physical Therapy

## 2023-09-16 DIAGNOSIS — M5459 Other low back pain: Secondary | ICD-10-CM

## 2023-09-16 DIAGNOSIS — M6281 Muscle weakness (generalized): Secondary | ICD-10-CM

## 2023-09-16 DIAGNOSIS — R29898 Other symptoms and signs involving the musculoskeletal system: Secondary | ICD-10-CM | POA: Diagnosis not present

## 2023-09-16 DIAGNOSIS — R293 Abnormal posture: Secondary | ICD-10-CM

## 2023-09-16 NOTE — Therapy (Signed)
 OUTPATIENT PHYSICAL THERAPY THORACOLUMBAR DISCHARGE   Patient Name: Brandy Medina MRN: [id number] DOB:[birth year], 73 y.o., female Today's Date: 09/16/2023  PHYSICAL THERAPY DISCHARGE SUMMARY  Visits from Start of Care: 4  Current functional level related to goals / functional outcomes: See below    Remaining deficits: See below    Education / Equipment: See below    Patient agrees to discharge. Patient goals were partially met. Patient is being discharged due to being pleased with the current functional level.   END OF SESSION:  PT End of Session - 09/16/23 1032     Visit Number 5    Number of Visits 5    Authorization Type [employer]    Authorization Time Period 08/25/23 to 10/06/23    Progress Note Due on Visit 10    PT Start Time 0934    PT Stop Time 1013    PT Time Calculation (min) 39 min    Activity Tolerance Patient tolerated treatment well    Behavior During Therapy WFL for tasks assessed/performed           Past Medical History:  Diagnosis Date   Allergic rhinitis    Diabetes (HCC)    HTN (hypertension)    Migraines    PONV (postoperative nausea and vomiting)    Pulmonary fibrosis (HCC)    Past Surgical History:  Procedure Laterality Date   ABDOMINAL HYSTERECTOMY  1989   partial   BIOPSY  06/01/2020   Procedure: BIOPSY;  Surgeon: Saintclair Jasper, MD;  Location: [facility];  Service: Gastroenterology;;  EGD and COLON   BIOPSY  06/04/2021   Procedure: BIOPSY;  Surgeon: Saintclair Jasper, MD;  Location: [facility];  Service: Gastroenterology;;   BREAST SURGERY     COLONOSCOPY WITH PROPOFOL  N/A 06/01/2020   Procedure: COLONOSCOPY WITH PROPOFOL ;  Surgeon: Saintclair Jasper, MD;  Location: [facility];  Service: Gastroenterology;  Laterality: N/A;   ESOPHAGOGASTRODUODENOSCOPY N/A 06/04/2021   Procedure: ESOPHAGOGASTRODUODENOSCOPY (EGD);  Surgeon: Saintclair Jasper, MD;  Location: [facility];  Service: Gastroenterology;  Laterality: N/A;   ESOPHAGOGASTRODUODENOSCOPY  (EGD) WITH PROPOFOL  N/A 06/01/2020   Procedure: ESOPHAGOGASTRODUODENOSCOPY (EGD) WITH PROPOFOL ;  Surgeon: Saintclair Jasper, MD;  Location: [facility];  Service: Gastroenterology;  Laterality: N/A;   POLYPECTOMY  06/01/2020   Procedure: POLYPECTOMY;  Surgeon: Saintclair Jasper, MD;  Location: [facility];  Service: Gastroenterology;;   SALPINGOOPHORECTOMY Left    Patient Active Problem List   Diagnosis Date Noted   Epistaxis 09/10/2023   DDD (degenerative disc disease), thoracic 08/19/2023   Neck pain 08/19/2023   Neuropathy of both feet 08/19/2023   Body mass index (BMI) 38.0-38.9, adult 08/19/2023   Obesity (BMI 30-39.9) 08/19/2023   Hyperpigmentation 05/10/2023   Hair thinning 05/10/2023   DM cataract (HCC) 05/10/2023   Renal insufficiency 05/10/2023   Herpes zoster without complication 03/04/2023   Aortic atherosclerosis (HCC) 02/15/2023   Paresthesia of lower extremity 02/15/2023   Pulmonary fibrosis (HCC) 02/15/2023   Estrogen deficiency 02/03/2023   Pain in left foot 11/07/2022   Plantar fasciitis of left foot 11/07/2022   Chronic rhinitis 02/05/2022   Recurrent infections 02/05/2022   Dietary counseling and surveillance 02/05/2022   Anemia of infection and chronic disease 07/23/2021   Pain of lumbar spine 02/26/2021   Normocytic anemia 04/17/2020   ILD (interstitial lung disease) (HCC) 02/27/2020   Sjogren's syndrome (HCC) 02/27/2020   Abnormal findings on diagnostic imaging of lung 02/27/2020   Abnormal ECG 12/12/2015   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  Essential hypertension 07/07/2014   Upper airway cough syndrome 07/06/2014   Obstructive bronchiectasis (HCC) 06/20/2009   Nonspecific (abnormal) findings on radiological and other examination of body structure 08/02/2008   COMPUTERIZED TOMOGRAPHY, CHEST, ABNORMAL 08/02/2008   Hypoglycemia 08/01/2008   MIGRAINE HEADACHE 08/01/2008   Sinusitis, chronic 08/01/2008   GERD 08/01/2008    ABDOMINAL PAIN, CHRONIC 08/01/2008   ANEMIA, IRON DEFICIENCY, HX OF 08/01/2008   DIVERTICULOSIS, COLON, HX OF 08/01/2008    PCP: Jarold Medici MD   REFERRING PROVIDER: Teressa Rainell BROCKS, DO  REFERRING DIAG:  Diagnosis  [phone] (ICD-10-CM) - Degeneration of intervertebral disc of lumbar region with discogenic back pain and lower extremity pain    Rationale for Evaluation and Treatment: Rehabilitation  THERAPY DIAG:  Other low back pain  Muscle weakness (generalized)  Other symptoms and signs involving the musculoskeletal system  Abnormal posture  ONSET DATE: 2022 for back pain, stabbing in thigh is more recent  SUBJECTIVE:                                                                                                                                                                                           SUBJECTIVE STATEMENT:  Feeling good, need to wrap up as I'm about to get really busy with the kids getting back to school. Feeling a lot better from HEP and the stretches Ron did with me last time. Able to get up better too.    EVAL: I have a lot going on in my back from the images, there was one morning where I had a big stab in my right thigh that lasted for 5 min if that but then I could barely walk. No numbness in the leg but do have neuropathy. No treatments for the back since 2022 besides [facility] which really didn't help. Have some balance concerns.   PERTINENT HISTORY:  See above   PAIN:  Are you having pain? 0/10 recently, more an ache not pain  PRECAUTIONS: None  RED FLAGS: None   WEIGHT BEARING RESTRICTIONS: No  FALLS:  Has patient fallen in last 6 months? No  LIVING ENVIRONMENT: Lives with: lives with their spouse Lives in: House/apartment   OCCUPATION: retired- taught school   PLOF: Independent, Independent with basic ADLs, Independent with gait, and Independent with transfers  PATIENT GOALS: get rid of aching, willing to get up  and move a little more often if that'll help   NEXT MD VISIT: referring PRN   OBJECTIVE:  Note: Objective measures were completed at Evaluation unless otherwise noted.  DIAGNOSTIC FINDINGS:   EXAM DESCRIPTION: MR LUMBAR SPINE WO CONTRAST  CLINICAL HISTORY: Lumbar radiculopathy, symptoms persist with > 6 wks treatment   COMPARISON: None Available.   TECHNIQUE: MRI of the lumbar spine is performed according to our usual protocol with axial and sagittal multi sequence imaging.   FINDINGS: The alignment is unremarkable. Mild degenerative disc disease throughout. No discogenic endplate marrow edema. The marrow signal is unremarkable as well as the conus and cauda equina.   L1-2 has mild bilateral foraminal narrowing due to broad-based disc bulge and facet hypertrophy.   L2-3 has a small right foraminal protrusion combined with facet hypertrophy to cause moderate foraminal narrowing with possible contact of the exiting nerve. There is mild to moderate degenerative left-sided foraminal narrowing and mild central stenosis as well due to broad-based disc bulge and facet/ligament flavum hypertrophy.   L3-4 has mild to moderate bilateral foraminal narrowing greater on the left due to broad-based disc bulge and facet hypertrophy.   L4-5 has mild to moderate bilateral foraminal narrowing and mild bilateral lateral recess narrowing due to broad-based disc bulge and facet/ligament flavum hypertrophy.   L5-S1 has mild to moderate bilateral foraminal narrowing and mild bilateral lateral recess narrowing due to broad-based disc bulge and facet/ligament flavum hypertrophy.   The imaged retroperitoneal structures are unremarkable.     IMPRESSION: Multilevel degenerative spondylosis with levels described in detail above. Multiple levels of mild to moderate degenerative foraminal narrowing.   L2-3 has a small right foraminal protrusion stripping to moderate foraminal narrowing with  possible contact of the exiting L2 nerve.  Narrative & Impression CLINICAL DATA:  Low back pain radiating into right leg.   EXAM: LUMBAR SPINE - 2-3 VIEW   COMPARISON:  None Available.   FINDINGS: Five non-rib-bearing lumbar vertebra. Normal lumbar alignment. No evidence of fracture or compression deformity. No visible pars defects or focal bone abnormalities. Mild diffuse disc space narrowing and spurring, most prominently affecting L1-L2 and L2-L3. Mild multilevel facet hypertrophy. Sacroiliac joints are congruent with mild degenerative spurring.   IMPRESSION: Mild multilevel degenerative disc disease and facet hypertrophy.  PATIENT SURVEYS:   Attempted PSFS, time limited and pt had difficulty identifying items that are isolated to her back and not linked to other medical issues   COGNITION: Overall cognitive status: WNL     SENSATION: Hx peripheral neuropathy   MUSCLE LENGTH: HS Mod limitation B Piriformis mod limitation B   09/16/23  HS and piriformis WNL B   POSTURE: rounded shoulders and forward head    LUMBAR ROM:   AROM eval 09/04/23 09/16/23  Flexion 50% limited tight  Limited 25% 25% limited   Extension 75% limited with hip compensation, REIS  Limited 25% WNL   Right lateral flexion WNL tight  WNL   Left lateral flexion WNL tight  WNL   Right rotation 25% WNL   Left rotation 25% WNL    (Blank rows = not tested)    LOWER EXTREMITY MMT:    MMT Right eval Left eval Right 09/16/23 Left 09/16/23  Hip flexion 4- 4+ 4+ 4+  Hip extension      Hip abduction 3 3 4  4+  Hip adduction      Hip internal rotation      Hip external rotation      Knee flexion 4+ 4+ 4+ 4+  Knee extension 4- 4 5 5   Ankle dorsiflexion      Ankle plantarflexion      Ankle inversion      Ankle eversion       (Blank rows =  not tested)   BALANCE   TUG 10 seconds SLS 7 seconds at best R/L Tandem stance 7-26 seconds R/L    09/16/23  SLS 20 seconds R/13 seconds L Tandem  30 seconds B     09/16/23 0001  Standardized Balance Assessment  Standardized Balance Assessment Dynamic Gait Index  Dynamic Gait Index  Level Surface 3  Change in Gait Speed 3  Gait with Horizontal Head Turns 3  Gait with Vertical Head Turns 3  Gait and Pivot Turn 3  Step Over Obstacle 3  Step Around Obstacles 2  Steps 3  Total Score 23     TREATMENT DATE:   09/16/23  MMT, lumbar ROM, DGI, goals for DC   Nustep L3x6 minutes BLEs only  Knee to shoulder piriformis stretch 2x30 seconds B Bridge + ABD into red TB x12  Sidelying hip ABD red TB x10 B PPT 15x3 seconds Hip hikes x12 B      09/04/23 NuStep L 5 x 6 min Shoulder Ext 5lb 2x10  HS curls 20lb 2x10 Leg Ext 5lb 2x10 Rows and Ext green 2x10 Bridges  Passive stretching to bilateral HS, piriformis, ITB, K2C  08/27/23 Bike L3 x 6 min S2S OHP red ball 2x10 Rows and Ext green 2x10 Bridges x10 LE on [place]ball Bridges, K2C, Oblq  Passive stretching to bilateral HS, piriformis, ITB, K2C  08/25/23  Eval, POC/HEP practice as below                                                                                                                                    PATIENT EDUCATION:  Education details: exam findings, POC, HEP  Person educated: Patient and Spouse Education method: Explanation, Demonstration, and Handouts Education comprehension: verbalized understanding, returned demonstration, and needs further education  HOME EXERCISE PROGRAM:  Access Code: [email or web address] URL: [email or web address] Date: 09/16/2023 Prepared by: Josette Rough  Exercises - Supine Lower Trunk Rotation  - 2 x daily - 7 x weekly - 1 sets - 10 reps - 5 seconds  hold - Standing Transverse Abdominis Contraction  - 3 x daily - 7 x weekly - 1 sets - 10 reps - 5 seconds  hold - Supine Piriformis Stretch  - 2 x daily - 7 x weekly - 1 sets - 4-6 reps - 30 seconds  hold - Supine Bridge with Resistance Band  - 2 x daily - 7 x  weekly - 1 sets - 10 reps - 2 seconds  hold - Supine Posterior Pelvic Tilt  - 2 x daily - 7 x weekly - 1 sets - 15 reps - 3 seconds  hold - Standing Hip Hiking  - 2 x daily - 7 x weekly - 1 sets - 10 reps  ASSESSMENT:  CLINICAL IMPRESSION:  Arrives today doing well, requesting DC as school is starting again soon. Got updated objectives and progressed HEP as appropriate. Doing very well, happy to see her  progress in PT- thank you for the referral!   OBJECTIVE IMPAIRMENTS: Abnormal gait, decreased activity tolerance, decreased balance, decreased mobility, difficulty walking, decreased ROM, decreased strength, increased fascial restrictions, increased muscle spasms, impaired flexibility, improper body mechanics, postural dysfunction, and pain.   ACTIVITY LIMITATIONS: carrying, lifting, sitting, standing, squatting, stairs, transfers, and locomotion level  PARTICIPATION LIMITATIONS: meal prep, cleaning, laundry, driving, shopping, community activity, occupation, and yard work  PERSONAL FACTORS: Age, Behavior pattern, Education, Fitness, Past/current experiences, Social background, and Time since onset of injury/illness/exacerbation are also affecting patient's functional outcome.   REHAB POTENTIAL: Fair chronicity of pain, Sjogrens   CLINICAL DECISION MAKING: Stable/uncomplicated  EVALUATION COMPLEXITY: Low   GOALS: Goals reviewed with patient? No  SHORT TERM GOALS: Target date: 09/15/2023    Will be compliant with appropriate progressive HEP GOAL STATUS: Met 08/27/23   2. Will demonstrate improved postural awareness with all functional tasks, use of ergonomic aides PRN/as desired GOAL STATUS: not met 09/16/23   3. Will demonstrate good functional biomechanics for bed mobility and floor to waist lifting mechanics   GOAL STATUS: Met 09/04/23  4. Will be able to maintain SLS for 15 seconds and tandem for at least 30 seconds B GOAL STATUS: partially met 09/16/23   LONG TERM GOALS:  Target date: 10/06/2023    MMT to have improved by one grade all weak groups GOAL STATUS: partially met 09/16/23  2. Mm flexibility and spasms to have improved by at least 50% in order to improve functional movement patterns and for pain control GOAL STATUS: met 09/16/23  3. Pain to be no more than 2/10 with all functional activities GOAL STATUS: met 09/16/23   4. Will score at least 20 on DGI to show reduced fall risk  GOAL STATUS: met 09/16/23  PLAN:  PT FREQUENCY: 2x/week  PT DURATION: 6 weeks  PLANNED INTERVENTIONS: 97750- Physical Performance Testing, 97110-Therapeutic exercises, 97530- Therapeutic activity, 97112- Neuromuscular re-education, 97535- Self Care, and 02859- Manual therapy.  PLAN FOR NEXT SESSION: DC today per her request   Josette Rough, PT, DPT 09/16/23 10:32 AM

## 2023-09-18 ENCOUNTER — Telehealth: Payer: Self-pay

## 2023-09-18 ENCOUNTER — Ambulatory Visit: Admitting: Physical Therapy

## 2023-09-18 ENCOUNTER — Other Ambulatory Visit (HOSPITAL_COMMUNITY): Payer: Self-pay

## 2023-09-18 NOTE — Telephone Encounter (Signed)
 Lvm for pt to call back.

## 2023-09-18 NOTE — Telephone Encounter (Signed)
 Pt need PA done for [facility] 3+ sensor. Thanks!

## 2023-09-18 NOTE — Telephone Encounter (Signed)
-----   Message from [facility] sent at 09/15/2023 11:21 AM EDT ----- Please let the patient know that I ordered Baqsimi  and discuss its use: Only to be taken if the blood sugar drops to the point that she is not able to eat or drink anything/passing out because of her low sugar. Thanks

## 2023-09-18 NOTE — Telephone Encounter (Signed)
 Pharmacy Patient Advocate Encounter   Received notification from Pt Calls Messages that prior authorization for [employer] 3 plus sensor is required/requested.   Insurance verification completed.   The patient is insured through [employer] .   Per test claim: PA required; PA submitted to above mentioned insurance via Latent Key/confirmation #/EOC [id number] Status is pending

## 2023-09-18 NOTE — Telephone Encounter (Signed)
 Spoke to pt regarding medication Baqsimi . Pt says that she hasn't picked it up from the pharmacy yet but that she will.

## 2023-09-21 ENCOUNTER — Ambulatory Visit: Payer: Self-pay | Admitting: Internal Medicine

## 2023-09-21 ENCOUNTER — Encounter: Payer: Self-pay | Admitting: Internal Medicine

## 2023-09-21 ENCOUNTER — Ambulatory Visit (INDEPENDENT_AMBULATORY_CARE_PROVIDER_SITE_OTHER): Admitting: Internal Medicine

## 2023-09-21 VITALS — BP 110/60 | HR 76 | Temp 98.2°F | Ht 64.0 in | Wt 160.0 lb

## 2023-09-21 DIAGNOSIS — I700 Atherosclerosis of aorta: Secondary | ICD-10-CM

## 2023-09-21 DIAGNOSIS — I5032 Chronic diastolic (congestive) heart failure: Secondary | ICD-10-CM

## 2023-09-21 DIAGNOSIS — N289 Disorder of kidney and ureter, unspecified: Secondary | ICD-10-CM | POA: Diagnosis not present

## 2023-09-21 DIAGNOSIS — I110 Hypertensive heart disease with heart failure: Secondary | ICD-10-CM | POA: Diagnosis not present

## 2023-09-21 DIAGNOSIS — E11649 Type 2 diabetes mellitus with hypoglycemia without coma: Secondary | ICD-10-CM | POA: Diagnosis not present

## 2023-09-21 DIAGNOSIS — Z0000 Encounter for general adult medical examination without abnormal findings: Secondary | ICD-10-CM | POA: Insufficient documentation

## 2023-09-21 LAB — POCT URINALYSIS DIP (CLINITEK)
Bilirubin, UA: NEGATIVE
Blood, UA: NEGATIVE
Glucose, UA: NEGATIVE mg/dL
Ketones, POC UA: NEGATIVE mg/dL
Leukocytes, UA: NEGATIVE
Nitrite, UA: NEGATIVE
POC PROTEIN,UA: NEGATIVE
Spec Grav, UA: 1.01 (ref 1.010–1.025)
Urobilinogen, UA: 0.2 U/dL
pH, UA: 5.5 (ref 5.0–8.0)

## 2023-09-21 NOTE — Assessment & Plan Note (Signed)
 Chronic, LDL goal is less than 70.  She is encouraged to follow heart healthy lifestyle. Clean eating, regular exercise and stress management are all a part of this lifestyle. She is also encouraged to continue with statin therapy.

## 2023-09-21 NOTE — Assessment & Plan Note (Signed)
 Chronic, primary hypertension managed with amlodipine  and valsartan . Part of cardiovascular risk management. - Follow low sodium diet.

## 2023-09-21 NOTE — Telephone Encounter (Signed)
 Pharmacy Patient Advocate Encounter  Received notification from [employer] that Prior Authorization for [facility] 3 plus sensor has been APPROVED from 01/28/23 to 01/27/2024   PA #/Case ID/Reference #: [id number]

## 2023-09-21 NOTE — Progress Notes (Signed)
 I,Jameka J Llittleton, CMA,acting as a Neurosurgeon for [name], MD.,have documented all relevant documentation on the behalf of [name], MD,as directed by  [name], MD while in the presence of [name], MD.  Subjective:    Patient ID: Brandy Medina , female    DOB: 03-Jun-1950 , 73 y.o.   MRN: [id number]  Chief Complaint  Patient presents with   Annual Exam    Patient presents today for a physical. Patient reports compliance with her meds. Patient denies having chest pain,sob or headaches at this time. Patient doesn't have any questions or concerns at this time.   Patient declined to get undress for exam.  Letter sent to Dr.Hecker for her eye exam    HPI Discussed the use of AI scribe software for clinical note transcription with the patient, who gave verbal consent to proceed.  History of Present Illness Brandy Medina is a 73 year old female who presents for a physical exam and blood pressure check.  She elected to stay fully clothed for her exam.  She has a history of diabetes, with two A1c readings of 6.5 and above. She is under the care of an endocrinologist and manages low blood sugar episodes by checking her blood sugar three times a day, especially before bed, and following a new diet with more frequent, smaller meals to prevent hypoglycemia. She experienced low blood sugar during the day, particularly after exercise, with a notable episode of 56 mg/dL between lunch and dinner about a week and a half ago. She is awaiting a continuous glucose monitor but is currently using a traditional meter.  She has a history of hypertension and is currently taking amlodipine  2.5 mg and valsartan  320 mg. She also takes rosuvastatin  5 mg once weekly for cholesterol management. Recent lab work in July showed elevated total protein and ferritin levels. Her kidney function was below 60 in January. She drinks a lot of water but experiences dry mouth.  She has pulmonary fibrosis and  uses breathing techniques from pulmonary rehab to manage shortness of breath. No chest pain or shortness of breath during walking. She reports constipation, managed with stool softeners and occasionally prunes, though she is cautious due to low blood sugar concerns. She has tried Benefiber and Miralax for bowel management.  She experiences leg pain, particularly in the right leg, attributed to physical therapy. The pain is described as aching or throbbing at night, relieved by Tylenol . She has not been walking recently due to leg issues but plans to resume her routine of walking three times a week in the morning after breakfast.   Hypertension This is a chronic problem. The problem has been gradually improving since onset. Pertinent negatives include no blurred vision, chest pain, palpitations or shortness of breath. Past treatments include calcium  channel blockers and angiotensin blockers. The current treatment provides moderate improvement.  Diabetes She presents for her follow-up diabetic visit. She has type 2 diabetes mellitus. Hypoglycemia symptoms include hunger. Pertinent negatives for diabetes include no blurred vision and no chest pain. Risk factors for coronary artery disease include diabetes mellitus, dyslipidemia and hypertension.     Past Medical History:  Diagnosis Date   Allergic rhinitis    Diabetes (HCC)    HTN (hypertension)    Migraines    PONV (postoperative nausea and vomiting)    Pulmonary fibrosis (HCC)      Family History  Problem Relation Age of Onset   Renal Disease Brother  Stroke Brother    Bronchitis Mother    Heart disease Mother    Heart disease Father    Heart attack Father    Hypertension Sister    Diabetes Sister    Heart attack Brother    Hypertension Sister    Diabetes Sister      Current Outpatient Medications:    acetaminophen  (TYLENOL ) 500 MG tablet, Take 500-1,000 mg by mouth every 6 (six) hours as needed for moderate pain or headache.,  Disp: , Rfl:    amLODipine  (NORVASC ) 2.5 MG tablet, Take 1 tablet (2.5 mg total) by mouth daily., Disp: 90 tablet, Rfl: 1   butalbital -acetaminophen -caffeine  (FIORICET) 50-325-40 MG tablet, Take 1 tablet by mouth every 6 (six) hours as needed for migraine., Disp: 14 tablet, Rfl: 0   Cholecalciferol (VITAMIN D) 50 MCG (2000 UT) tablet, Take 1,000 Units by mouth daily. Tues and Thurs, Disp: , Rfl:    clobetasol  (TEMOVATE ) 0.05 % external solution, Apply 1 Application topically daily. Apply to scalp daily as needed for itch., Disp: 50 mL, Rfl: 2   Continuous Glucose Sensor (FREESTYLE LIBRE 3 PLUS SENSOR) MISC, Change sensor every 15 days., Disp: 6 each, Rfl: 3   dextromethorphan (DELSYM) 30 MG/5ML liquid, Take 30 mg by mouth 2 (two) times daily as needed for cough., Disp: , Rfl:    epoetin  alfa (EPOGEN ) 10000 UNIT/ML injection, as directed Injection once every 3 weeks for 3 months, Disp: , Rfl:    Glucagon  (BAQSIMI  ONE PACK) 3 MG/DOSE POWD, Place 1 Device into the nose as needed (Low blood sugar with impaired consciousness)., Disp: 2 each, Rfl: 3   glucose blood test strip, Use as instructed, Disp: 100 each, Rfl: 12   Homeopathic Products (ARNICARE PAIN RELIEF EX), Apply 1 application. topically daily as needed (pain)., Disp: , Rfl:    Respiratory Therapy Supplies (FLUTTER) DEVI, Use as directed., Disp: 1 each, Rfl: 0   rosuvastatin  (CRESTOR ) 5 MG tablet, Take 1 tablet (5 mg total) by mouth every Tuesday., Disp: 12 tablet, Rfl: 3   Safety Seal Miscellaneous MISC, melaxemic cream - tranexamic acid 5% kojic acid 2% vit c 2.5% hyaluronic acid 0.1% - apply to affected areas BID morning and night., Disp: 15 g, Rfl: 4   Safety Seal Miscellaneous MISC, Apply 1 application  topically every morning. Medication Name: Hormonic Hair Apply to scalp daily in the morning., Disp: 30 mL, Rfl: 3   valsartan  (DIOVAN ) 320 MG tablet, Take 1 tablet (320 mg total) by mouth daily., Disp: 90 tablet, Rfl: 2   Wheat Dextrin  (BENEFIBER PO), Take 1 Scoop by mouth daily., Disp: , Rfl:    Allergies  Allergen Reactions   Avelox [Moxifloxacin Hcl In Nacl] Other (See Comments)    confusion   Avelox [Moxifloxacin] Other (See Comments)   Azathioprine  Other (See Comments)   Codeine Nausea Only and Other (See Comments)   Erythromycin     GI upset   Fexofenadine Other (See Comments)   Levocetirizine Other (See Comments)   Lisinopril     Headache   Sulfa Antibiotics Other (See Comments)   Sulfonamide Derivatives     headache   Zyrtec [Cetirizine] Other (See Comments)      The patient states she uses post menopausal status for birth control. No LMP recorded. Patient has had a hysterectomy.. Negative for Dysmenorrhea. Negative for: breast discharge, breast lump(s), breast pain and breast self exam. Associated symptoms include abnormal vaginal bleeding. Pertinent negatives include abnormal bleeding (hematology), anxiety, decreased libido, depression,  difficulty falling sleep, dyspareunia, history of infertility, nocturia, sexual dysfunction, sleep disturbances, urinary incontinence, urinary urgency, vaginal discharge and vaginal itching. Diet regular.The patient states her exercise level is  moderate - walking.  . The patient's tobacco use is:  Social History   Tobacco Use  Smoking Status Former   Current packs/day: 0.00   Average packs/day: 0.5 packs/day for 18.0 years (9.0 ttl pk-yrs)   Types: Cigarettes   Start date: 07/06/1975   Quit date: 07/05/1993   Years since quitting: 30.2  Smokeless Tobacco Never  . She has been exposed to passive smoke. The patient's alcohol use is:  Social History   Substance and Sexual Activity  Alcohol Use Yes   Comment: Occasional Wine or Martini    Review of Systems  Constitutional: Negative.   HENT: Negative.    Eyes: Negative.  Negative for blurred vision.  Respiratory: Negative.  Negative for shortness of breath.   Cardiovascular: Negative.  Negative for chest pain and  palpitations.  Gastrointestinal: Negative.   Endocrine: Negative.   Genitourinary: Negative.   Musculoskeletal: Negative.   Skin: Negative.   Allergic/Immunologic: Negative.   Neurological: Negative.   Hematological: Negative.   Psychiatric/Behavioral: Negative.       Today's Vitals   09/21/23 0937  BP: 110/60  Pulse: 76  Temp: 98.2 F (36.8 C)  TempSrc: Oral  Weight: 160 lb (72.6 kg)  Height: 5' 4 (1.626 m)  PainSc: 0-No pain   Body mass index is 27.46 kg/m.  Wt Readings from Last 3 Encounters:  09/21/23 160 lb (72.6 kg)  09/15/23 158 lb (71.7 kg)  08/03/23 158 lb (71.7 kg)     Objective:  Physical Exam Vitals and nursing note reviewed.  Constitutional:      Appearance: Normal appearance.  HENT:     Head: Normocephalic and atraumatic.     Right Ear: Tympanic membrane, ear canal and external ear normal. There is no impacted cerumen.     Left Ear: Tympanic membrane, ear canal and external ear normal. There is no impacted cerumen.     Mouth/Throat:     Pharynx: No oropharyngeal exudate or posterior oropharyngeal erythema.  Eyes:     Extraocular Movements: Extraocular movements intact.     Conjunctiva/sclera: Conjunctivae normal.     Pupils: Pupils are equal, round, and reactive to light.  Cardiovascular:     Rate and Rhythm: Normal rate and regular rhythm.     Pulses:          Dorsalis pedis pulses are 2+ on the right side and 2+ on the left side.     Heart sounds: Normal heart sounds.  Pulmonary:     Effort: Pulmonary effort is normal.     Breath sounds: Normal breath sounds.  Abdominal:     General: Bowel sounds are normal.     Palpations: Abdomen is soft.  Genitourinary:    Comments: Deferred  Musculoskeletal:        General: Normal range of motion.     Cervical back: Normal range of motion.  Feet:     Right foot:     Protective Sensation: 5 sites tested.  5 sites sensed.     Skin integrity: Dry skin present.     Toenail Condition: Right toenails  are normal.     Left foot:     Protective Sensation: 5 sites tested.  5 sites sensed.     Skin integrity: Dry skin present.     Toenail Condition: Left toenails are  normal.  Skin:    General: Skin is warm.  Neurological:     General: No focal deficit present.     Mental Status: She is alert.  Psychiatric:        Mood and Affect: Mood normal.        Behavior: Behavior normal.     NOTE: Pt elected to stay fully clothed for exam.     Assessment And Plan:     Encounter for general adult medical examination w/o abnormal findings Assessment & Plan: A full exam was performed.  Importance of monthly self breast exams was discussed with the patient.  She is advised to get 30-45 minutes of regular exercise, no less than four to five days per week. Both weight-bearing and aerobic exercises are recommended.  She is advised to follow a healthy diet with at least six fruits/veggies per day, decrease intake of red meat and other saturated fats and to increase fish intake to twice weekly.  Meats/fish should not be fried -- baked, boiled or broiled is preferable. It is also important to cut back on your sugar intake.  Be sure to read labels - try to avoid anything with added sugar, high fructose corn syrup or other sweeteners.  If you must use a sweetener, you can try stevia or monkfruit.  It is also important to avoid artificially sweetened foods/beverages and diet drinks. Lastly, wear SPF 50 sunscreen on exposed skin and when in direct sunlight for an extended period of time.  Be sure to avoid fast food restaurants and aim for at least 60 ounces of water daily.       Hypertensive heart disease with chronic diastolic congestive heart failure (HCC) Assessment & Plan: Chronic, primary hypertension managed with amlodipine  and valsartan . Part of cardiovascular risk management. - Follow low sodium diet.   Orders: -     POCT URINALYSIS DIP (CLINITEK) -     Microalbumin / creatinine urine ratio  Aortic  atherosclerosis (HCC) Assessment & Plan: Chronic, LDL goal is less than 70.  She is encouraged to follow heart healthy lifestyle. Clean eating, regular exercise and stress management are all a part of this lifestyle. She is also encouraged to continue with statin therapy.     Renal insufficiency Assessment & Plan: Chronic, will stage patient after today's visit.  - Encouraged to stay well hydrated - Avoid NSAIDs, take Tylenol  prn pain - Obtain urine sample to check for proteinuria. - Encourage adequate hydration.   Type 2 diabetes mellitus with hypoglycemia without coma, without long-term current use of insulin  (HCC) Assessment & Plan: Type 2 diabetes with A1c readings =6.5, managed by endocrinologist. Experiencing hypoglycemia post-exercise and between meals. Awaiting insurance approval for continuous glucose monitor. - Order A1c test. - Continue checking blood sugars three times a day. - Await insurance approval for continuous glucose monitor. - Follow up with endocrinologist.  Orders: -     Hemoglobin A1c -     Lipid panel   Return for 1 year physical, 6 month bp. Patient was given opportunity to ask questions. Patient verbalized understanding of the plan and was able to repeat key elements of the plan. All questions were answered to their satisfaction.   I, [name], MD, have reviewed all documentation for this visit. The documentation on 09/21/23 for the exam, diagnosis, procedures, and orders are all accurate and complete.

## 2023-09-21 NOTE — Patient Instructions (Signed)

## 2023-09-21 NOTE — Assessment & Plan Note (Signed)

## 2023-09-21 NOTE — Assessment & Plan Note (Addendum)
 Chronic, will stage patient after today's visit.  - Encouraged to stay well hydrated - Avoid NSAIDs, take Tylenol  prn pain - Obtain urine sample to check for proteinuria. - Encourage adequate hydration.

## 2023-09-21 NOTE — Assessment & Plan Note (Signed)
 Sjogren's syndrome causing dry mouth. Elevated total protein and ferritin likely due to inflammation. - Encourage use of crushed ice for dry mouth to decrease nocturia

## 2023-09-21 NOTE — Assessment & Plan Note (Signed)
 Type 2 diabetes with A1c readings =6.5, managed by endocrinologist. Experiencing hypoglycemia post-exercise and between meals. Awaiting insurance approval for continuous glucose monitor. - Order A1c test. - Continue checking blood sugars three times a day. - Await insurance approval for continuous glucose monitor. - Follow up with endocrinologist.

## 2023-09-22 LAB — HEMOGLOBIN A1C
Est. average glucose Bld gHb Est-mCnc: 131 mg/dL
Hgb A1c MFr Bld: 6.2 % — ABNORMAL HIGH (ref 4.8–5.6)

## 2023-09-22 LAB — LIPID PANEL
Chol/HDL Ratio: 2 ratio (ref 0.0–4.4)
Cholesterol, Total: 186 mg/dL (ref 100–199)
HDL: 93 mg/dL (ref >39)
LDL Chol Calc (NIH): 81 mg/dL (ref 0–99)
Triglycerides: 61 mg/dL (ref 0–149)
VLDL Cholesterol Cal: 12 mg/dL (ref 5–40)

## 2023-09-22 LAB — MICROALBUMIN / CREATININE URINE RATIO
Creatinine, Urine: 52.7 mg/dL
Microalb/Creat Ratio: 99 mg/g{creat} — ABNORMAL HIGH (ref 0–29)
Microalbumin, Urine: 52.3 ug/mL

## 2023-09-25 NOTE — Telephone Encounter (Signed)
 Pt was in office today for assistance with setting up libre account and with placement of sensor. Pt understood how to place sensor moving forward.

## 2023-09-30 ENCOUNTER — Other Ambulatory Visit: Payer: Self-pay

## 2023-09-30 ENCOUNTER — Encounter: Payer: Self-pay | Admitting: Internal Medicine

## 2023-09-30 MED ORDER — VALSARTAN 320 MG PO TABS: 320.0000 mg | ORAL_TABLET | Freq: Every day | ORAL | 2 refills | Status: AC

## 2023-10-06 ENCOUNTER — Encounter: Payer: Self-pay | Admitting: Pulmonary Disease

## 2023-10-09 NOTE — Telephone Encounter (Signed)
**Note De-identified  [place] Obfuscation** Please advise 

## 2023-10-15 ENCOUNTER — Ambulatory Visit (INDEPENDENT_AMBULATORY_CARE_PROVIDER_SITE_OTHER): Admitting: Otolaryngology

## 2023-10-16 DIAGNOSIS — Z1231 Encounter for screening mammogram for malignant neoplasm of breast: Secondary | ICD-10-CM | POA: Diagnosis not present

## 2023-10-16 LAB — HM MAMMOGRAPHY

## 2023-10-18 ENCOUNTER — Encounter: Payer: Self-pay | Admitting: "Endocrinology

## 2023-10-18 DIAGNOSIS — E162 Hypoglycemia, unspecified: Secondary | ICD-10-CM

## 2023-10-18 DIAGNOSIS — E11649 Type 2 diabetes mellitus with hypoglycemia without coma: Secondary | ICD-10-CM

## 2023-10-19 MED ORDER — FREESTYLE LIBRE 3 PLUS SENSOR MISC: 3 refills | Status: DC

## 2023-10-21 ENCOUNTER — Encounter: Payer: Self-pay | Admitting: Internal Medicine

## 2023-10-26 ENCOUNTER — Encounter: Attending: "Endocrinology | Admitting: Dietician

## 2023-10-26 VITALS — Wt 161.0 lb

## 2023-10-26 DIAGNOSIS — E119 Type 2 diabetes mellitus without complications: Secondary | ICD-10-CM | POA: Diagnosis not present

## 2023-10-26 NOTE — Progress Notes (Unsigned)
 Diabetes Self-Management Education  Visit Type: First/Initial  Appt. Start Time: 0905 Appt. End Time: 1015  10/28/2023  [name], identified by name and date of birth, is a 73 y.o. female with a diagnosis of Diabetes: Type 2.   ASSESSMENT  Patient is here today alone.  Patient states that she has hypoglycemia and prediabetes.  A1C meetes diagnostic criteria in 01/2023.  Referral:  Type 2 Diabetes, hypoglycemia (Dr. Dartha)  History includes:  Type 2 Diabetes, anemia, Schogren's, CKD, pulmonary fibrosis Labs noted:  A1C 6.2% 09/21/2023 decreased from 6.5% 05/05/2023, C-peptide 3.5 05/05/2023, insulin  14.5 on 4/8.2025, eGFR 58 04/30/2023, Vitamin B-12 1275 on 02/03/2023 Medications include:  crestor , vitamin D, MVI, occasional vitamin C, benefiber (occasionally but wonders if this causes a headache) CGM:  FreeStyle Libre 3+  CGM Results from download: 10/16/2023  % Time CGM active:   96 %   (Goal >70%)  Average glucose:   97 mg/dL for 14 days  Glucose management indicator:   5.6 %  Time in range (70-180 mg/dL):   98 %   (Goal >29%)  Time High (181-250 mg/dL):   1 %   (Goal < 74%)  Time Very High (>250 mg/dL):    0 %   (Goal < 5%)  Time Low (54-69 mg/dL):   1 %   (Goal <5%)  Time Very Low (<54 mg/dL):   0 %   (Goal <8%)  %CV (glucose variability)    0 %  (Goal <36%)    64 161 lbs 9/209/2025 - gained weight with increased snacks due to low blood glucose 156 lbs 06/2023  Patient lives with her husband.   She enjoys cooking and baking.  Her husband has well controlled type 2 diabetes.  She is retired Psychologist, forensic. Occasional wine with food. Black coffee, decaf Cannot tolerate raw onions or onion powder or red onions cooked or raw.  Nutrition intervention: Discussed diet in relationship to her other health issues (Type 2 Diabetes, hypoglycemia (reactive), CKD.  There were no vitals taken for this visit. There is no height or weight on file to calculate  BMI.    Individualized Plan for Diabetes Self-Management Training:   Learning Objective:  Patient will have a greater understanding of diabetes self-management. Patient education plan is to attend individual and/or group sessions per assessed needs and concerns.   Plan:   Patient Instructions  Vitamin D3K2 Less processed foods Choose whole grain Listen to your body Detective work if you get a true low Pre-treat exercise with a snack with small amount of protein and a carbohydrate.   From Dr. Elliot note 08/2023:  Patient states that she did not see this on her after visit summary. 10-Point Nutrition Plan for Preventing Hypoglycemia  Control portions of carbohydrate - 30 grams/meal, 15 grams/snack. Choose low-glycemic carbohydrates. Avoid high-glycemic carbohydrates. Include (heart-healthy) fats in each meal or snack - 15 grams/meal, 5 grams/snack. Emphasize optimal protein intake. Space meals/snacks 3-4 hours apart. Avoid consuming liquids with meals. Avoid alcohol. Avoid caffeine . Maintain post-bariatric vitamin and mineral intake.      Low Glycemic Index Carbohydrates (CHOOSE) Steel-cut oats (regular, not quick-cook or instant) Oat bran cereal Beans/legumes (e.g., garbanzo, navy, kidney, lima, pinto, black-eyed and pea beans, edamame (soybeans), lentils Bean products (e.g., hummus, tofu) Pearled barley, cooked al dente Yams Some fruits (e.g., grapefruit, apples, pears, berries, apricots, peaches) Some pasta (e.g., Barilla Plus pasta), cooked al dente Some whole grain breads (e.g., Ezekiel bread, Joseph's Flax, Oat Bran & Whole  Wheat Pita/Lavash/Tortillas) Some whole grain crackers (e.g., RyKrisp, RyVita, Wasa) Brown rice, wild rice Quinoa  Buckwheat (a grass) - Sprouts    High Glycemic Index Carbohydrates (AVOID) Refined breakfast cereals (e.g., Corn Flakes, Rice Krispies, Cream of Rice, instant oatmeal) Regular pasta Most starchy vegetables (e.g., white  potatoes, corn, winter (orange) squash) White rice, rice cakes Popcorn, pretzels, chips Some fruits (e.g., ripe bananas, pineapple, mango, watermelon, grapes) All fruit juices and sweetened drinks (e.g., sodas, sweetened iced tea) Bread, rolls, bagels, English muffins, and crackers made with refined flour Sweets (e.g., candy, cake, cookies, ice cream, syrup)    Heart-Healthy Fats (Adapt) Nuts, nut butters Avocado, guacamole Olives Most plant oils (e.g., olive, canola, peanut, soy, sunflower, sesame) Most seeds (e.g., sunflower, flax, sesame/sesame tahini) Oily fish (e.g., salmon, bluefish, mackerel, tuna, sardines)    __________________   Take a 15 gm snack of carbohydrate at bedtime before you go to sleep if your blood sugar is less than 100.   If you are going to fast after midnight for a test or procedure, ask your physician for instructions on how to reduce/decrease your insulin  dose.   Call if blood sugar is less than 70 or consistently above 250  Expected Outcomes:  Demonstrated interest in learning. Expect positive outcomes  Education material provided: ADA - How to Thrive: A Guide for Your Journey with Diabetes, hypogllycema nutrition therapy from AND, meal plan card  If problems or questions, patient to contact team via:  Phone  Future DSME appointment: 2 months

## 2023-10-26 NOTE — Patient Instructions (Addendum)
 Vitamin D3K2 Less processed foods Choose whole grain Listen to your body Detective work if you get a true low Pre-treat exercise with a snack with small amount of protein and a carbohydrate.   From Dr. Elliot note 08/2023: 10-Point Nutrition Plan for Preventing Hypoglycemia  Control portions of carbohydrate - 30 grams/meal, 15 grams/snack. Choose low-glycemic carbohydrates. Avoid high-glycemic carbohydrates. Include (heart-healthy) fats in each meal or snack - 15 grams/meal, 5 grams/snack. Emphasize optimal protein intake. Space meals/snacks 3-4 hours apart. Avoid consuming liquids with meals. Avoid alcohol. Avoid caffeine . Maintain post-bariatric vitamin and mineral intake.      Low Glycemic Index Carbohydrates (CHOOSE) Steel-cut oats (regular, not quick-cook or instant) Oat bran cereal Beans/legumes (e.g., garbanzo, navy, kidney, lima, pinto, black-eyed and pea beans, edamame (soybeans), lentils Bean products (e.g., hummus, tofu) Pearled barley, cooked al dente Yams Some fruits (e.g., grapefruit, apples, pears, berries, apricots, peaches) Some pasta (e.g., Barilla Plus pasta), cooked al dente Some whole grain breads (e.g., Ezekiel bread, Joseph's Flax, Oat Bran & Whole Wheat Pita/Lavash/Tortillas) Some whole grain crackers (e.g., RyKrisp, RyVita, Wasa) Brown rice, wild rice Quinoa  Buckwheat (a grass) - Sprouts    High Glycemic Index Carbohydrates (AVOID) Refined breakfast cereals (e.g., Corn Flakes, Rice Krispies, Cream of Rice, instant oatmeal) Regular pasta Most starchy vegetables (e.g., white potatoes, corn, winter (orange) squash) White rice, rice cakes Popcorn, pretzels, chips Some fruits (e.g., ripe bananas, pineapple, mango, watermelon, grapes) All fruit juices and sweetened drinks (e.g., sodas, sweetened iced tea) Bread, rolls, bagels, English muffins, and crackers made with refined flour Sweets (e.g., candy, cake, cookies, ice cream, syrup)     Heart-Healthy Fats (Adapt) Nuts, nut butters Avocado, guacamole Olives Most plant oils (e.g., olive, canola, peanut, soy, sunflower, sesame) Most seeds (e.g., sunflower, flax, sesame/sesame tahini) Oily fish (e.g., salmon, bluefish, mackerel, tuna, sardines)    __________________   Take a 15 gm snack of carbohydrate at bedtime before you go to sleep if your blood sugar is less than 100.   If you are going to fast after midnight for a test or procedure, ask your physician for instructions on how to reduce/decrease your insulin  dose.   Call if blood sugar is less than 70 or consistently above 250

## 2023-11-03 ENCOUNTER — Encounter: Payer: Self-pay | Admitting: Cardiology

## 2023-11-03 ENCOUNTER — Ambulatory Visit: Attending: Cardiology | Admitting: Cardiology

## 2023-11-03 VITALS — BP 142/86 | HR 72 | Ht 64.0 in | Wt 164.4 lb

## 2023-11-03 DIAGNOSIS — I2510 Atherosclerotic heart disease of native coronary artery without angina pectoris: Secondary | ICD-10-CM

## 2023-11-03 DIAGNOSIS — I10 Essential (primary) hypertension: Secondary | ICD-10-CM | POA: Diagnosis not present

## 2023-11-03 DIAGNOSIS — E782 Mixed hyperlipidemia: Secondary | ICD-10-CM

## 2023-11-03 DIAGNOSIS — I2584 Coronary atherosclerosis due to calcified coronary lesion: Secondary | ICD-10-CM | POA: Diagnosis not present

## 2023-11-03 NOTE — Progress Notes (Signed)
 Cardiology Office Note:    Date:  11/03/2023   ID:  DHANA TOTTON, DOB 28-Dec-1950, MRN [id number]  PCP:  Jarold Medici, MD  Cardiologist:  Dub Huntsman, DO  Electrophysiologist:  None   Referring MD: Jarold Medici, MD    I have shortness of breath and also has been having some intermittent twinging pain onto my breast.   History of Present Illness:    Brandy Medina is a 73 y.o. female with a hx of hypertension, iron deficiency anemia, stroke and syndrome, ILD, coronary calcification seen on coronary CT calcium  scoring.   Since her last visit with me she has completed pulmonary rehab she is doing well.  No specific cardiac complaints.    Past Medical History:  Diagnosis Date   Allergic rhinitis    Diabetes (HCC)    HTN (hypertension)    Migraines    PONV (postoperative nausea and vomiting)    Pulmonary fibrosis (HCC)     Past Surgical History:  Procedure Laterality Date   ABDOMINAL HYSTERECTOMY  1989   partial   BIOPSY  06/01/2020   Procedure: BIOPSY;  Surgeon: Saintclair Jasper, MD;  Location: [facility];  Service: Gastroenterology;;  EGD and COLON   BIOPSY  06/04/2021   Procedure: BIOPSY;  Surgeon: Saintclair Jasper, MD;  Location: [facility];  Service: Gastroenterology;;   BREAST SURGERY     COLONOSCOPY WITH PROPOFOL  N/A 06/01/2020   Procedure: COLONOSCOPY WITH PROPOFOL ;  Surgeon: Saintclair Jasper, MD;  Location: [facility];  Service: Gastroenterology;  Laterality: N/A;   ESOPHAGOGASTRODUODENOSCOPY N/A 06/04/2021   Procedure: ESOPHAGOGASTRODUODENOSCOPY (EGD);  Surgeon: Saintclair Jasper, MD;  Location: [facility];  Service: Gastroenterology;  Laterality: N/A;   ESOPHAGOGASTRODUODENOSCOPY (EGD) WITH PROPOFOL  N/A 06/01/2020   Procedure: ESOPHAGOGASTRODUODENOSCOPY (EGD) WITH PROPOFOL ;  Surgeon: Saintclair Jasper, MD;  Location: [facility];  Service: Gastroenterology;  Laterality: N/A;   POLYPECTOMY  06/01/2020   Procedure: POLYPECTOMY;  Surgeon: Saintclair Jasper, MD;  Location: [facility];   Service: Gastroenterology;;   SALPINGOOPHORECTOMY Left     Current Medications: Current Meds  Medication Sig   acetaminophen  (TYLENOL ) 500 MG tablet Take 500-1,000 mg by mouth every 6 (six) hours as needed for moderate pain or headache.   amLODipine  (NORVASC ) 2.5 MG tablet Take 1 tablet (2.5 mg total) by mouth daily.   butalbital -acetaminophen -caffeine  (FIORICET) 50-325-40 MG tablet Take 1 tablet by mouth every 6 (six) hours as needed for migraine.   Cholecalciferol (VITAMIN D) 50 MCG (2000 UT) tablet Take 1,000 Units by mouth daily. Tues and Thurs   clobetasol  (TEMOVATE ) 0.05 % external solution Apply 1 Application topically daily. Apply to scalp daily as needed for itch.   Continuous Glucose Sensor (FREESTYLE LIBRE 3 PLUS SENSOR) MISC Change sensor every 15 days.   dextromethorphan (DELSYM) 30 MG/5ML liquid Take 30 mg by mouth 2 (two) times daily as needed for cough.   epoetin  alfa (EPOGEN ) 10000 UNIT/ML injection as directed Injection once every 3 weeks for 3 months   Glucagon  (BAQSIMI  ONE PACK) 3 MG/DOSE POWD Place 1 Device into the nose as needed (Low blood sugar with impaired consciousness).   glucose blood test strip Use as instructed   Homeopathic Products (ARNICARE PAIN RELIEF EX) Apply 1 application. topically daily as needed (pain).   Respiratory Therapy Supplies (FLUTTER) DEVI Use as directed.   rosuvastatin  (CRESTOR ) 5 MG tablet Take 1 tablet (5 mg total) by mouth every Tuesday.   Safety Seal Miscellaneous MISC melaxemic cream - tranexamic acid 5% kojic acid 2% vit c  2.5% hyaluronic acid 0.1% - apply to affected areas BID morning and night.   Safety Seal Miscellaneous MISC Apply 1 application  topically every morning. Medication Name: Hormonic Hair Apply to scalp daily in the morning.   valsartan  (DIOVAN ) 320 MG tablet Take 1 tablet (320 mg total) by mouth daily.   Wheat Dextrin (BENEFIBER PO) Take 1 Scoop by mouth daily.     Allergies:   Avelox [moxifloxacin hcl in nacl],  Avelox [moxifloxacin], Azathioprine , Codeine, Erythromycin, Fexofenadine, Levocetirizine, Lisinopril, Sulfa antibiotics, Sulfonamide derivatives, and Zyrtec [cetirizine]   Social History   Socioeconomic History   Marital status: Married    Spouse name: Not on file   Number of children: Not on file   Years of education: Not on file   Highest education level: Bachelor's degree (e.g., BA, AB, BS)  Occupational History   Not on file  Tobacco Use   Smoking status: Former    Current packs/day: 0.00    Average packs/day: 0.5 packs/day for 18.0 years (9.0 ttl pk-yrs)    Types: Cigarettes    Start date: 07/06/1975    Quit date: 07/05/1993    Years since quitting: 30.3   Smokeless tobacco: Never  Substance and Sexual Activity   Alcohol use: Yes    Comment: Occasional Wine or Martini   Drug use: Never   Sexual activity: Not Currently  Other Topics Concern   Not on file  Social History Narrative   Are you right handed or left handed? Right ended   Are you currently employed ? No   What is your current occupation? Retired   Do you live at home alone? No    Who lives with you?  Lives with husband    What type of home do you live in: 1 story or 2 story? Lives in a two story home.        Social Drivers of Corporate investment banker Strain: Low Risk  (09/20/2023)   Overall Financial Resource Strain (CARDIA)    Difficulty of Paying Living Expenses: Not hard at all  Food Insecurity: No Food Insecurity (09/20/2023)   Hunger Vital Sign    Worried About Running Out of Food in the Last Year: Never true    Ran Out of Food in the Last Year: Never true  Transportation Needs: No Transportation Needs (09/20/2023)   PRAPARE - Administrator, Civil Service (Medical): No    Lack of Transportation (Non-Medical): No  Physical Activity: Unknown (09/20/2023)   Exercise Vital Sign    Days of Exercise per Week: Patient declined    Minutes of Exercise per Session: Not on file  Stress: No Stress  Concern Present (09/20/2023)   [employer] of Occupational Health - Occupational Stress Questionnaire    Feeling of Stress: Not at all  Social Connections: Socially Integrated (09/20/2023)   Social Connection and Isolation Panel    Frequency of Communication with Friends and Family: More than three times a week    Frequency of Social Gatherings with Friends and Family: Patient declined    Attends Religious Services: More than 4 times per year    Active Member of [employer] or Organizations: Yes    Attends Banker Meetings: Patient declined    Marital Status: Married     Family History: The patient's family history includes Bronchitis in her mother; Diabetes in her sister and sister; Heart attack in her brother and father; Heart disease in her father and mother; Hypertension in her  sister and sister; Renal Disease in her brother; Stroke in her brother.  ROS:   Review of Systems  Constitution: Negative for decreased appetite, fever and weight gain.  HENT: Negative for congestion, ear discharge, hoarse voice and sore throat.   Eyes: Negative for discharge, redness, vision loss in right eye and visual halos.  Cardiovascular: Negative for chest pain, dyspnea on exertion, leg swelling, orthopnea and palpitations.  Respiratory: Negative for cough, hemoptysis, shortness of breath and snoring.   Endocrine: Negative for heat intolerance and polyphagia.  Hematologic/Lymphatic: Negative for bleeding problem. Does not bruise/bleed easily.  Skin: Negative for flushing, nail changes, rash and suspicious lesions.  Musculoskeletal: Negative for arthritis, joint pain, muscle cramps, myalgias, neck pain and stiffness.  Gastrointestinal: Negative for abdominal pain, bowel incontinence, diarrhea and excessive appetite.  Genitourinary: Negative for decreased libido, genital sores and incomplete emptying.  Neurological: Negative for brief paralysis, focal weakness, headaches and loss of balance.   Psychiatric/Behavioral: Negative for altered mental status, depression and suicidal ideas.  Allergic/Immunologic: Negative for HIV exposure and persistent infections.    EKGs/Labs/Other Studies Reviewed:    The following studies were reviewed today:   EKG:  None today  TTE 05/10/2020 IMPRESSIONS   1. Left ventricular ejection fraction, by estimation, is 65 to 70%. Left ventricular ejection fraction by 3D volume is 72 %. The left ventricle has normal function. The left ventricle has no regional wall motion  abnormalities. There is mild asymmetric  left ventricular hypertrophy of the basal-septal segment. Left ventricular  diastolic parameters are consistent with Grade I diastolic dysfunction  (impaired relaxation).   2. Right ventricular systolic function is normal. The right ventricular  size is normal. There is normal pulmonary artery systolic pressure. The  estimated right ventricular systolic pressure is 29.0 mmHg.   3. Left atrial size was mildly dilated.   4. The mitral valve is normal in structure. Trivial mitral valve  regurgitation.   5. The aortic valve is tricuspid. There is mild thickening of the aortic  valve. Aortic valve regurgitation is not visualized. No aortic stenosis is  present.   6. The inferior vena cava is normal in size with greater than 50%  respiratory variability, suggesting right atrial pressure of 3 mmHg.   Comparison(s): Compared to prior echo in 2017, there is no significant  change.   FINDINGS   Left Ventricle: Left ventricular ejection fraction, by estimation, is 65  to 70%. Left ventricular ejection fraction by 3D volume is 72 %. The left  ventricle has normal function. The left ventricle has no regional wall  motion abnormalities. The left  ventricular internal cavity size was normal in size. There is mild  asymmetric left ventricular hypertrophy of the basal-septal segment. Left  ventricular diastolic parameters are consistent with Grade I  diastolic  dysfunction (impaired relaxation).   Right Ventricle: The right ventricular size is normal. No increase in  right ventricular wall thickness. Right ventricular systolic function is  normal. There is normal pulmonary artery systolic pressure. The tricuspid  regurgitant velocity is 2.55 m/s, and   with an assumed right atrial pressure of 3 mmHg, the estimated right  ventricular systolic pressure is 29.0 mmHg.   Left Atrium: Left atrial size was mildly dilated.   Right Atrium: Right atrial size was normal in size.   Pericardium: There is no evidence of pericardial effusion.   Mitral Valve: The mitral valve is normal in structure. There is mild  thickening of the mitral valve leaflet(s). There is mild  calcification of  the mitral valve leaflet(s). Mild to moderate mitral annular  calcification. Trivial mitral valve regurgitation.   Tricuspid Valve: The tricuspid valve is normal in structure. Tricuspid  valve regurgitation is trivial.   Aortic Valve: The aortic valve is tricuspid. There is mild thickening of  the aortic valve. Aortic valve regurgitation is not visualized. No aortic  stenosis is present.   Pulmonic Valve: The pulmonic valve was not well visualized. Pulmonic valve  regurgitation is not visualized.   Aorta: The aortic root and ascending aorta are structurally normal, with  no evidence of dilitation.   Venous: The inferior vena cava is normal in size with greater than 50%  respiratory variability, suggesting right atrial pressure of 3 mmHg.   IAS/Shunts: No atrial level shunt detected by color flow Doppler.     Recent Labs: 02/03/2023: TSH 1.880 08/10/2023: ALT 15; BUN 23; Creatinine 0.90; Hemoglobin 10.0; Platelet Count 211; Potassium 4.1; Sodium 135  Recent Lipid Panel    Component Value Date/Time   CHOL 186 09/21/2023 1031   TRIG 61 09/21/2023 1031   HDL 93 09/21/2023 1031   CHOLHDL 2.0 09/21/2023 1031   LDLCALC 81 09/21/2023 1031    Physical  Exam:    VS:  BP (!) 142/86   Pulse 72   Ht 5' 4 (1.626 m)   Wt 164 lb 6.4 oz (74.6 kg)   SpO2 96%   BMI 28.22 kg/m     Wt Readings from Last 3 Encounters:  11/03/23 164 lb 6.4 oz (74.6 kg)  10/28/23 161 lb (73 kg)  09/21/23 160 lb (72.6 kg)     GEN: Well nourished, well developed in no acute distress HEENT: Normal NECK: No JVD; No carotid bruits LYMPHATICS: No lymphadenopathy CARDIAC: S1S2 noted,RRR, no murmurs, rubs, gallops RESPIRATORY:  Clear to auscultation without rales, wheezing or rhonchi  ABDOMEN: Soft, non-tender, non-distended, +bowel sounds, no guarding. EXTREMITIES: No edema, No cyanosis, no clubbing MUSCULOSKELETAL:  No deformity  SKIN: Warm and dry NEUROLOGIC:  Alert and oriented x 3, non-focal PSYCHIATRIC:  Normal affect, good insight  ASSESSMENT:    1. Coronary artery calcification of native artery   2. Primary hypertension   3. Mixed hyperlipidemia      PLAN:    Blood pressure is is elevated in the office today.  But this is a isolated event so what I will do is have her take her blood pressure and send me update in 2 weeks.  She should be elevated and not plan to increase her amlodipine  to 5 mg daily.  Hyperlipidemia - continue with current statin medication.  Coronary calcification-continue current dose of statin.   The patient is in agreement with the above plan. The patient left the office in stable condition.  The patient will follow up in   Medication Adjustments/Labs and Tests Ordered: Current medicines are reviewed at length with the patient today.  Concerns regarding medicines are outlined above.  No orders of the defined types were placed in this encounter.  No orders of the defined types were placed in this encounter.   Patient Instructions  CHECK BLOOD PRESSURE DAILY FOR 2 WEEKS AND SEND RESULTS THROUGH MYCHART  Blood Pressure Record Sheet To take your blood pressure, you will need a blood pressure machine. You can buy a blood  pressure machine (blood pressure monitor) at your clinic, drug store, or online. When choosing one, consider: An automatic monitor that has an arm cuff. A cuff that wraps snugly around your upper arm.  You should be able to fit only one finger between your arm and the cuff. A device that stores blood pressure reading results. Do not choose a monitor that measures your blood pressure from your wrist or finger. Follow your health care provider's instructions for how to take your blood pressure. To use this form: Take your blood pressure medications every day These measurements should be taken when you have been at rest for at least 10-15 min Take at least 2 readings with each blood pressure check. This makes sure the results are correct. Wait 1-2 minutes between measurements. Write down the results in the spaces on this form. Keep in mind it should always be recorded systolic over diastolic. Both numbers are important.  Repeat this every day for 2-3 weeks, or as told by your health care provider.  Make a follow-up appointment with your health care provider to discuss the results.  Blood Pressure Log Date Medications taken? (Y/N) Blood Pressure Time of Day                                                                                                          Follow-Up: At [facility], you and your health needs are our priority.  As part of our continuing mission to provide you with exceptional heart care, our providers are all part of one team.  This team includes your primary Cardiologist (physician) and Advanced Practice Providers or APPs (Physician Assistants and Nurse Practitioners) who all work together to provide you with the care you need, when you need it.  Your next appointment:   1 year(s)  Provider:   Klayten Jolliff, DO              Adopting a Healthy Lifestyle.  Know what a healthy weight is for you (roughly BMI <25) and aim to maintain this    Aim for 7+ servings of fruits and vegetables daily   65-80+ fluid ounces of water or unsweet tea for healthy kidneys   Limit to max 1 drink of alcohol per day; avoid smoking/tobacco   Limit animal fats in diet for cholesterol and heart health - choose grass fed whenever available   Avoid highly processed foods, and foods high in saturated/trans fats   Aim for low stress - take time to unwind and care for your mental health   Aim for 150 min of moderate intensity exercise weekly for heart health, and weights twice weekly for bone health   Aim for 7-9 hours of sleep daily   When it comes to diets, agreement about the perfect plan isnt easy to find, even among the experts. Experts at the [facility] of [employer] developed an idea known as the Healthy Eating Plate. Just imagine a plate divided into logical, healthy portions.   The emphasis is on diet quality:   Load up on vegetables and fruits - one-half of your plate: Aim for color and variety, and remember that potatoes dont count.   Go for whole grains - one-quarter of your plate: Whole wheat, barley, wheat  berries, quinoa, oats, brown rice, and foods made with them. If you want pasta, go with whole wheat pasta.   Protein power - one-quarter of your plate: Fish, chicken, beans, and nuts are all healthy, versatile protein sources. Limit red meat.   The diet, however, does go beyond the plate, offering a few other suggestions.   Use healthy plant oils, such as olive, canola, soy, corn, sunflower and peanut. Check the labels, and avoid partially hydrogenated oil, which have unhealthy trans fats.   If youre thirsty, drink water. Coffee and tea are good in moderation, but skip sugary drinks and limit milk and dairy products to one or two daily servings.   The type of carbohydrate in the diet is more important than the amount. Some sources of carbohydrates, such as vegetables, fruits, whole grains, and beans-are healthier than  others.   Finally, stay active  Signed, Dub Huntsman, DO  11/03/2023 8:49 AM    [facility]

## 2023-11-03 NOTE — Patient Instructions (Signed)
 CHECK BLOOD PRESSURE DAILY FOR 2 WEEKS AND SEND RESULTS THROUGH MYCHART  Blood Pressure Record Sheet To take your blood pressure, you will need a blood pressure machine. You can buy a blood pressure machine (blood pressure monitor) at your clinic, drug store, or online. When choosing one, consider: An automatic monitor that has an arm cuff. A cuff that wraps snugly around your upper arm. You should be able to fit only one finger between your arm and the cuff. A device that stores blood pressure reading results. Do not choose a monitor that measures your blood pressure from your wrist or finger. Follow your health care provider's instructions for how to take your blood pressure. To use this form: Take your blood pressure medications every day These measurements should be taken when you have been at rest for at least 10-15 min Take at least 2 readings with each blood pressure check. This makes sure the results are correct. Wait 1-2 minutes between measurements. Write down the results in the spaces on this form. Keep in mind it should always be recorded systolic over diastolic. Both numbers are important.  Repeat this every day for 2-3 weeks, or as told by your health care provider.  Make a follow-up appointment with your health care provider to discuss the results.  Blood Pressure Log Date Medications taken? (Y/N) Blood Pressure Time of Day                                                                                                          Follow-Up: At [facility], you and your health needs are our priority.  As part of our continuing mission to provide you with exceptional heart care, our providers are all part of one team.  This team includes your primary Cardiologist (physician) and Advanced Practice Providers or APPs (Physician Assistants and Nurse Practitioners) who all work together to provide you with the care you need, when you need it.  Your next  appointment:   1 year(s)  Provider:   Kardie Tobb, DO

## 2023-11-05 ENCOUNTER — Ambulatory Visit: Admitting: Pulmonary Disease

## 2023-11-05 ENCOUNTER — Encounter: Payer: Self-pay | Admitting: Pulmonary Disease

## 2023-11-05 VITALS — BP 130/82 | HR 77 | Ht 64.0 in | Wt 164.0 lb

## 2023-11-05 DIAGNOSIS — J849 Interstitial pulmonary disease, unspecified: Secondary | ICD-10-CM

## 2023-11-05 NOTE — Patient Instructions (Signed)
  VISIT SUMMARY: Today, you had a follow-up visit to discuss your pulmonary fibrosis and Sjogren's syndrome. You reported improved exercise tolerance and daily function since your last visit, and you have completed pulmonary rehabilitation with significant benefit.  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 You have shown improvement in symptoms after pulmonary rehabilitation. -We will order a chest CT and pulmonary function test in six months. -If your symptoms change, we may consider earlier imaging.  SJOGREN'S SYNDROME: You have a history of Sjogren's syndrome, but you are not currently experiencing any symptoms or complications related to it. -Continue monitoring for any new symptoms or complications.

## 2023-11-05 NOTE — Progress Notes (Signed)
 Brandy Medina    [id number]    January 03, 1951  Primary Care Physician:Sanders, Catheryn, MD  Referring Physician: Jarold Catheryn, MD [address] [place],  [place] [address]  Chief complaint: Follow up for interstitial lung disease, Sjogren's syndrome  HPI: 73 y.o. patient with history of von Willebrand's disease, Sjogren's syndrome, maintained on Plaquenil  which is stopped due to darkening skin.  Started on Imuran  by Dr. Mai, rheumatology Diagnosed with streptococcal pneumonia in February 2022 which is treated with antibiotics CT scan shows progressive UIP fibrosis and she has been referred to pulmonary clinic for further evaluation  Complains of minimal cough, chronic dyspnea on exertion which is progressive in nature Has dry mouth, dry eyes  Azathioprine  was held in February 2022 due to pancytopenia, elevated LFTs.  She is undergoing work-up with Dr. Gudena for this.  She does have formal diagnosis of von Willebrand's disease but on repeat testing does not have any evidence of this.  She may need a bone marrow biopsy  She started Esbriet  on 04/26/2020.  It was initially well-tolerated but last week she developed side effects of dizziness, lightheadedness, headache, loss of appetite.  She has held the Esbriet  and is now feeling better. Discussed starting Ofev but decided against it in 2024.  Finished pulmonary rehab in 2025 and feels that it has improved her exercise capacity  Interim history: Discussed the use of AI scribe software for clinical note transcription with the patient, who gave verbal consent to proceed.  History of Present Illness  Brandy Medina is a 73 year old female with Sjogren's syndrome and pulmonary fibrosis who presents for follow-up.  Dyspnea and functional status - Improved exercise tolerance and ability to perform daily activities since last visit six months ago - Completed pulmonary rehabilitation with significant benefit - No current  limitations in ambulation or daily function reported  Pulmonary fibrosis management - History of pulmonary fibrosis previously managed with azathioprine  and pirfenidone  - Discontinued azathioprine  and pirfenidone  due to side effects including weight loss and decreased appetite - Remains off antifibrotic and immunosuppressive therapy, particularly due to concern for hypoglycemia  Sjogren's syndrome - History of Sjogren's syndrome - No current symptoms or complications related to Sjogren's syndrome reported   Relevant pulmonary history Pets: No pets 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  She has done cushions in the living room couch but exposures is minimal Smoking history: 9  pack-year smoker.  Quit in 1995 Travel history: Originally from [place] .  No significant recent travel Relevant family history: No family history of lung disease  Outpatient Encounter Medications as of 11/05/2023  Medication Sig   acetaminophen  (TYLENOL ) 500 MG tablet Take 500-1,000 mg by mouth every 6 (six) hours as needed for moderate pain or headache.   amLODipine  (NORVASC ) 2.5 MG tablet Take 1 tablet (2.5 mg total) by mouth daily.   butalbital -acetaminophen -caffeine  (FIORICET) 50-325-40 MG tablet Take 1 tablet by mouth every 6 (six) hours as needed for migraine.   Cholecalciferol (VITAMIN D) 50 MCG (2000 UT) tablet Take 1,000 Units by mouth daily. Tues and Thurs   clobetasol  (TEMOVATE ) 0.05 % external solution Apply 1 Application topically daily. Apply to scalp daily as needed for itch.   Continuous Glucose Sensor (FREESTYLE LIBRE 3 PLUS SENSOR) MISC Change sensor every 15 days.   dextromethorphan (DELSYM) 30 MG/5ML liquid Take 30 mg by mouth 2 (two) times daily as needed for cough.   epoetin  alfa (EPOGEN ) 10000  UNIT/ML injection as directed Injection once every 3 weeks for 3 months   Glucagon  (BAQSIMI  ONE PACK) 3 MG/DOSE POWD Place 1 Device into the nose as needed (Low  blood sugar with impaired consciousness).   glucose blood test strip Use as instructed   Homeopathic Products (ARNICARE PAIN RELIEF EX) Apply 1 application. topically daily as needed (pain).   Respiratory Therapy Supplies (FLUTTER) DEVI Use as directed.   rosuvastatin  (CRESTOR ) 5 MG tablet Take 1 tablet (5 mg total) by mouth every Tuesday.   Safety Seal Miscellaneous MISC melaxemic cream - tranexamic acid 5% kojic acid 2% vit c 2.5% hyaluronic acid 0.1% - apply to affected areas BID morning and night.   Safety Seal Miscellaneous MISC Apply 1 application  topically every morning. Medication Name: Hormonic Hair Apply to scalp daily in the morning.   valsartan  (DIOVAN ) 320 MG tablet Take 1 tablet (320 mg total) by mouth daily.   Wheat Dextrin (BENEFIBER PO) Take 1 Scoop by mouth daily.   No facility-administered encounter medications on file as of 11/05/2023.  , Vitals:   11/05/23 0855  BP: 130/82  Pulse: 77  Height: 5' 4 (1.626 m)  Weight: 164 lb (74.4 kg)  SpO2: 99%  BMI (Calculated): 28.14     Physical Exam GEN: No acute distress. CV: Regular rate and rhythm, no murmurs. LUNGS: Clear to auscultation bilaterally, normal respiratory effort. SKIN JOINTS: Warm and dry, no rash.    Data Reviewed: Imaging: CT chest 02/20/2020-moderate to severe pulmonary fibrosis in UIP pattern.  Worsened since previous scan in 2010, groundglass opacities in the peripheral right upper lobe.   High-resolution CT 12/13/2021-slightly worsened pulmonary fibrosis in UIP pattern.  High resolution CT 01/11/2023-continued progression of UIP pattern pulmonary fibrosis I have reviewed the images personally.  PFTs: 04/05/2020 FVC 2.66 [108%], FEV1 2.18 [114%], DLCO 8.23 [41%]  11/13/2021 FVC 2.79 [94%], FEV1 2.37 [105%], F/F 85, TLC 4.39 [86%], DLCO 10.33 [53%] Moderate diffusion defect  02/10/2023 FVC 2.76 [94%], FEV1 2.08 [162%], F/F76, TLC 5.81 [114%], DLCO 10.98 (56%] Moderate diffusion  defect  Labs: CMP 05/14/2020- AST 18, ALT 9 Assessment & Plan Pulmonary fibrosis due to Sjogren's syndrome Pulmonary fibrosis associated with Sjogren's syndrome, previously treated with azathioprine  and pirfenidone , discontinued due to lack of efficacy and adverse effects, including weight loss and decreased appetite. Reports improvement in symptoms following pulmonary rehabilitation. Declined further medication such as nintedanib due to previous adverse effects and concern for GI side effects with current hypoglycemia concerns.  Finished pulmonary rehab in 2025 - Order chest CT and pulmonary function test in six months - Consider earlier imaging if symptoms change  Sjogren's syndrome Sjogren's syndrome with associated pulmonary fibrosis. Not on specific immunosuppressive treatment.  Symptoms are well-managed  Plan/Recommendations: Hold off on antifibrotic and immune suppressive therapy per patient preference Follow-up in 6 months with PFTs, CT scan  Lonna Coder MD [facility] 11/05/2023, 9:08 AM  CC: Jarold Medici, MD

## 2023-11-14 ENCOUNTER — Other Ambulatory Visit: Payer: Self-pay | Admitting: Internal Medicine

## 2023-11-24 NOTE — Telephone Encounter (Signed)
 Please review

## 2023-12-18 ENCOUNTER — Encounter: Payer: Self-pay | Admitting: "Endocrinology

## 2023-12-18 ENCOUNTER — Ambulatory Visit: Admitting: "Endocrinology

## 2023-12-18 VITALS — BP 104/70 | Ht 64.0 in | Wt 165.0 lb

## 2023-12-18 DIAGNOSIS — E162 Hypoglycemia, unspecified: Secondary | ICD-10-CM | POA: Diagnosis not present

## 2023-12-18 DIAGNOSIS — R7303 Prediabetes: Secondary | ICD-10-CM

## 2023-12-18 NOTE — Progress Notes (Signed)
 Outpatient Endocrinology Note Obadiah Birmingham, MD    Brandy Medina Jun 14, 1950 [id number]  Referring Provider: Jarold Medici, MD Primary Care Provider: Jarold Medici, MD Reason for consultation: Subjective   Assessment & Plan  Diagnoses and all orders for this visit:  Prediabetes -     Basic metabolic panel with GFR -     Insulin , random -     Insulin  antibodies, blood -     C-peptide -     Beta-hydroxybutyric acid -     Proinsulin -     Cortisol -     Glucagon  -     Insulin -like growth factor  Hypoglycemia -     Basic metabolic panel with GFR -     Insulin , random -     Insulin  antibodies, blood -     C-peptide -     Beta-hydroxybutyric acid -     Proinsulin -     Cortisol -     Glucagon  -     Insulin -like growth factor   Hypoglycemia induced by carbs/heavy carb intake Patient has history of diabetes with A1c 6.5 x 2, not on any diabetes medication/supplement that can cause low, now pre-diabetic  Could not tolerate Jardiance  due to worsening low blood sugar Has blood glucose meter that she uses to check her blood sugar as needed, has CGM Recommend high-protein, low carb diet Recommend start one spoonful of cornstarch with cottage cheese am and pm   Continue nutritionist counseling Ordered fasting baseline labs  Previously,  Discussed carb counting, given handout as well as ordered diabetes education Discussed various protein sources to improve protein intake to avoid hypoglycemia Patient understands and agreed  Return in about 3 months (around 03/19/2024) for visit and 8 am labs before next visit.   I have reviewed current medications, nurse's notes, allergies, vital signs, past medical and surgical history, family medical history, and social history for this encounter. Counseled patient on symptoms, examination findings, lab findings, imaging results, treatment decisions and monitoring and prognosis. The patient understood the recommendations and agrees  with the treatment plan. All questions regarding treatment plan were fully answered.  Obadiah Birmingham, MD  12/18/23   History of Present Illness HPI  ENIYA CANNADY is a 73 y.o. year old female who presents for evaluation of hypoglycemia diagnosed in 7s managed by diet.  Saw nutritionist and has improved thereafter  History of reactive hypoglycemia in 1984 Has CGM  CGM interpretation: At today's visit, we reviewed her CGM downloads. The full report is scanned in the media. Reviewing the CGM trends, BG are low in evening and overnight and rarely low in between.  Initial history:  Recently she was put on jardiance  for renal protection that led to hypoglycemia of 61.   Uses blood glucose meter fasting+-at bedtime   Has a 56 blood sugar last week  A1C is in pre-diabetes range  Gets nervous stomach and sweating sometimes with lows, other times may not feel it  Has history of renal insufficiency  -The patient's medication and drug history was reviewed carefully for potential causes of hypoglycemia. -No history of insulin  usage or ingestion of an oral hypoglycemic agent -No history of alcoholism -No history of  hepatic cirrhosis/failure, -No history of other endocrine diseases -History of total gastrectomy + for stomach cancer -History of cardiac arrythmia  -No history of endocrine disorders (eg, pheochromocytoma, Addison disease, glucagon  deficiency, carcinomas, extrahepatic tumors) -No history of substance abuse (eg, cocaine, ethanol, salicylates, beta-blockers, pentamidine) -No  history of nutritional disorders (eg, prolonged starvation before anesthesia, protein calorie malnutrition, L-leucine-sensitive hypoglycemic defect in children, low-calorie ketogenic diet, renal disease)   Physical Exam  BP 104/70   Ht 5' 4 (1.626 m)   Wt 165 lb (74.8 kg)   BMI 28.32 kg/m    Constitutional: well developed, well nourished Head: normocephalic, atraumatic Eyes: sclera anicteric,  no redness Neck: supple Lungs: normal respiratory effort Neurology: alert and oriented Skin: dry, no appreciable rashes Musculoskeletal: no appreciable defects Psychiatric: normal mood and affect   Current Medications Patient's Medications  New Prescriptions   No medications on file  Previous Medications   ACETAMINOPHEN  (TYLENOL ) 500 MG TABLET    Take 500-1,000 mg by mouth every 6 (six) hours as needed for moderate pain or headache.   AMLODIPINE  (NORVASC ) 2.5 MG TABLET    Take 1 tablet (2.5 mg total) by mouth daily.   BUTALBITAL -ACETAMINOPHEN -CAFFEINE  (FIORICET) 50-325-40 MG TABLET    Take 1 tablet by mouth every 6 (six) hours as needed for migraine.   CHOLECALCIFEROL (VITAMIN D) 50 MCG (2000 UT) TABLET    Take 1,000 Units by mouth daily. Tues and Thurs   CLOBETASOL  (TEMOVATE ) 0.05 % EXTERNAL SOLUTION    Apply 1 Application topically daily. Apply to scalp daily as needed for itch.   CONTINUOUS GLUCOSE SENSOR (FREESTYLE LIBRE 3 PLUS SENSOR) MISC    Change sensor every 15 days.   DEXTROMETHORPHAN (DELSYM) 30 MG/5ML LIQUID    Take 30 mg by mouth 2 (two) times daily as needed for cough.   EPOETIN  ALFA (EPOGEN ) 10000 UNIT/ML INJECTION    as directed Injection once every 3 weeks for 3 months   GLUCAGON  (BAQSIMI  ONE PACK) 3 MG/DOSE POWD    Place 1 Device into the nose as needed (Low blood sugar with impaired consciousness).   GLUCOSE BLOOD TEST STRIP    Use as instructed   HOMEOPATHIC PRODUCTS (ARNICARE PAIN RELIEF EX)    Apply 1 application. topically daily as needed (pain).   RESPIRATORY THERAPY SUPPLIES (FLUTTER) DEVI    Use as directed.   ROSUVASTATIN  (CRESTOR ) 5 MG TABLET    TAKE 1 TABLET(5 MG) BY MOUTH EVERY TUESDAY   SAFETY SEAL MISCELLANEOUS MISC    melaxemic cream - tranexamic acid 5% kojic acid 2% vit c 2.5% hyaluronic acid 0.1% - apply to affected areas BID morning and night.   SAFETY SEAL MISCELLANEOUS MISC    Apply 1 application  topically every morning. Medication Name: Hormonic  Hair Apply to scalp daily in the morning.   VALSARTAN  (DIOVAN ) 320 MG TABLET    Take 1 tablet (320 mg total) by mouth daily.   WHEAT DEXTRIN (BENEFIBER PO)    Take 1 Scoop by mouth daily.  Modified Medications   No medications on file  Discontinued Medications   No medications on file    Allergies Allergies  Allergen Reactions   Avelox [Moxifloxacin Hcl In Nacl] Other (See Comments)    confusion   Avelox [Moxifloxacin] Other (See Comments)   Azathioprine  Other (See Comments)   Codeine Nausea Only and Other (See Comments)   Erythromycin     GI upset   Fexofenadine Other (See Comments)   Levocetirizine Other (See Comments)   Lisinopril     Headache   Sulfa Antibiotics Other (See Comments)   Sulfonamide Derivatives     headache   Zyrtec [Cetirizine] Other (See Comments)    Past Medical History Past Medical History:  Diagnosis Date   Allergic rhinitis  Diabetes (HCC)    HTN (hypertension)    Migraines    PONV (postoperative nausea and vomiting)    Pulmonary fibrosis (HCC)     Past Surgical History Past Surgical History:  Procedure Laterality Date   ABDOMINAL HYSTERECTOMY  1989   partial   BIOPSY  06/01/2020   Procedure: BIOPSY;  Surgeon: Saintclair Jasper, MD;  Location: [facility];  Service: Gastroenterology;;  EGD and COLON   BIOPSY  06/04/2021   Procedure: BIOPSY;  Surgeon: Saintclair Jasper, MD;  Location: [facility];  Service: Gastroenterology;;   BREAST SURGERY     COLONOSCOPY WITH PROPOFOL  N/A 06/01/2020   Procedure: COLONOSCOPY WITH PROPOFOL ;  Surgeon: Saintclair Jasper, MD;  Location: [facility];  Service: Gastroenterology;  Laterality: N/A;   ESOPHAGOGASTRODUODENOSCOPY N/A 06/04/2021   Procedure: ESOPHAGOGASTRODUODENOSCOPY (EGD);  Surgeon: Saintclair Jasper, MD;  Location: [facility];  Service: Gastroenterology;  Laterality: N/A;   ESOPHAGOGASTRODUODENOSCOPY (EGD) WITH PROPOFOL  N/A 06/01/2020   Procedure: ESOPHAGOGASTRODUODENOSCOPY (EGD) WITH PROPOFOL ;  Surgeon: Saintclair Jasper, MD;  Location: [facility];  Service: Gastroenterology;  Laterality: N/A;   POLYPECTOMY  06/01/2020   Procedure: POLYPECTOMY;  Surgeon: Saintclair Jasper, MD;  Location: [facility];  Service: Gastroenterology;;   SALPINGOOPHORECTOMY Left     Family History family history includes Bronchitis in her mother; Diabetes in her sister and sister; Heart attack in her brother and father; Heart disease in her father and mother; Hypertension in her sister and sister; Renal Disease in her brother; Stroke in her brother.  Social History Social History   Socioeconomic History   Marital status: Married    Spouse name: Not on file   Number of children: Not on file   Years of education: Not on file   Highest education level: Bachelor's degree (e.g., BA, AB, BS)  Occupational History   Not on file  Tobacco Use   Smoking status: Former    Current packs/day: 0.00    Average packs/day: 0.5 packs/day for 18.0 years (9.0 ttl pk-yrs)    Types: Cigarettes    Start date: 07/06/1975    Quit date: 07/05/1993    Years since quitting: 30.4   Smokeless tobacco: Never  Substance and Sexual Activity   Alcohol use: Yes    Comment: Occasional Wine or Martini   Drug use: Never   Sexual activity: Not Currently  Other Topics Concern   Not on file  Social History Narrative   Are you right handed or left handed? Right ended   Are you currently employed ? No   What is your current occupation? Retired   Do you live at home alone? No    Who lives with you?  Lives with husband    What type of home do you live in: 1 story or 2 story? Lives in a two story home.        Social Drivers of Corporate Investment Banker Strain: Low Risk  (09/20/2023)   Overall Financial Resource Strain (CARDIA)    Difficulty of Paying Living Expenses: Not hard at all  Food Insecurity: No Food Insecurity (09/20/2023)   Hunger Vital Sign    Worried About Running Out of Food in the Last Year: Never true    Ran Out of Food in the Last  Year: Never true  Transportation Needs: No Transportation Needs (09/20/2023)   PRAPARE - Administrator, Civil Service (Medical): No    Lack of Transportation (Non-Medical): No  Physical Activity: Unknown (09/20/2023)   Exercise Vital Sign  Days of Exercise per Week: Patient declined    Minutes of Exercise per Session: Not on file  Stress: No Stress Concern Present (09/20/2023)   [employer] of Occupational Health - Occupational Stress Questionnaire    Feeling of Stress: Not at all  Social Connections: Socially Integrated (09/20/2023)   Social Connection and Isolation Panel    Frequency of Communication with Friends and Family: More than three times a week    Frequency of Social Gatherings with Friends and Family: Patient declined    Attends Religious Services: More than 4 times per year    Active Member of Clubs or Organizations: Yes    Attends Banker Meetings: Patient declined    Marital Status: Married  Catering Manager Violence: Not At Risk (04/24/2023)   Humiliation, Afraid, Rape, and Kick questionnaire    Fear of Current or Ex-Partner: No    Emotionally Abused: No    Physically Abused: No    Sexually Abused: No    Lab Results  Component Value Date   CHOL 186 09/21/2023   Lab Results  Component Value Date   HDL 93 09/21/2023   Lab Results  Component Value Date   LDLCALC 81 09/21/2023   Lab Results  Component Value Date   TRIG 61 09/21/2023   Lab Results  Component Value Date   CHOLHDL 2.0 09/21/2023   Lab Results  Component Value Date   CREATININE 0.90 08/10/2023   Lab Results  Component Value Date   GFR 52.93 (L) 05/14/2020      Component Value Date/Time   NA 135 08/10/2023 0817   NA 134 02/03/2023 1537   NA 133 (L) 11/20/2015 1608   K 4.1 08/10/2023 0817   K 4.2 11/20/2015 1608   CL 103 08/10/2023 0817   CO2 28 08/10/2023 0817   CO2 24 11/20/2015 1608   GLUCOSE 91 08/10/2023 0817   GLUCOSE 90 11/20/2015 1608   BUN  23 08/10/2023 0817   BUN 17 02/03/2023 1537   BUN 25.3 11/20/2015 1608   CREATININE 0.90 08/10/2023 0817   CREATININE 1.0 11/20/2015 1608   CALCIUM  9.7 08/10/2023 0817   CALCIUM  10.5 (H) 11/20/2015 1608   PROT 8.9 (H) 08/10/2023 0817   PROT 9.1 (H) 05/05/2023 1620   PROT 10.3 (H) 11/20/2015 1608   ALBUMIN 3.8 08/10/2023 0817   ALBUMIN 4.1 02/03/2023 1537   ALBUMIN 3.6 11/20/2015 1608   AST 22 08/10/2023 0817   AST 19 11/20/2015 1608   ALT 15 08/10/2023 0817   ALT 13 11/20/2015 1608   ALKPHOS 67 08/10/2023 0817   ALKPHOS 98 11/20/2015 1608   BILITOT 0.4 08/10/2023 0817   BILITOT 0.33 11/20/2015 1608   GFRNONAA >60 08/10/2023 0817      Latest Ref Rng & Units 08/10/2023    8:17 AM 04/30/2023    6:15 PM 02/05/2023    7:36 AM  BMP  Glucose 70 - 99 mg/dL 91  91  97   BUN 8 - 23 mg/dL 23  30  27    Creatinine 0.44 - 1.00 mg/dL 9.09  8.97  8.99   Sodium 135 - 145 mmol/L 135  132  135   Potassium 3.5 - 5.1 mmol/L 4.1  5.1  4.4   Chloride 98 - 111 mmol/L 103  100  103   CO2 22 - 32 mmol/L 28  25  27    Calcium  8.9 - 10.3 mg/dL 9.7  9.0  9.7        Component  Value Date/Time   WBC 3.5 (L) 08/10/2023 0817   WBC 4.4 04/30/2023 1815   RBC 3.18 (L) 08/10/2023 0817   HGB 10.0 (L) 08/10/2023 0817   HGB 9.8 (L) 02/05/2022 1633   HGB 9.5 (L) 11/20/2015 1608   HCT 29.9 (L) 08/10/2023 0817   HCT 29.5 (L) 02/05/2022 1633   HCT 27.7 (L) 11/20/2015 1608   PLT 211 08/10/2023 0817   PLT 150 02/05/2022 1633   MCV 94.0 08/10/2023 0817   MCV 93 02/05/2022 1633   MCV 92.3 11/20/2015 1608   MCH 31.4 08/10/2023 0817   MCHC 33.4 08/10/2023 0817   RDW 14.4 08/10/2023 0817   RDW 15.1 02/05/2022 1633   RDW 14.3 11/20/2015 1608   LYMPHSABS 1.0 08/10/2023 0817   LYMPHSABS 0.9 02/05/2022 1633   LYMPHSABS 1.0 11/20/2015 1608   MONOABS 0.6 08/10/2023 0817   MONOABS 0.4 11/20/2015 1608   EOSABS 0.1 08/10/2023 0817   EOSABS 0.0 02/05/2022 1633   BASOSABS 0.0 08/10/2023 0817   BASOSABS 0.0 02/05/2022  1633   BASOSABS 0.0 11/20/2015 1608   Lab Results  Component Value Date   TSH 1.880 02/03/2023   TSH 1.95 03/27/2008         Parts of this note may have been dictated using voice recognition software. There may be variances in spelling and vocabulary which are unintentional. Not all errors are proofread. Please notify the dino if any discrepancies are noted or if the meaning of any statement is not clear.

## 2023-12-18 NOTE — Patient Instructions (Signed)
 What are the hypoglycemia signs or symptoms?  Mild Hunger or feeling like you may vomit. Sweating and feeling cold to the touch. Feeling dizzy or light-headed. Being sleepy or having trouble sleeping. A headache. Blurry vision. Mood changes. These include feeling worried, nervous, or easily annoyed.  Moderate Feeling confused. Changes in the way you act. Weakness. An uneven heartbeat.  Very bad Having very low blood sugar is an emergency. It can cause: Fainting. Seizures. A coma. Death.

## 2023-12-25 ENCOUNTER — Other Ambulatory Visit: Payer: Self-pay

## 2023-12-25 ENCOUNTER — Other Ambulatory Visit: Payer: Self-pay | Admitting: Internal Medicine

## 2023-12-25 ENCOUNTER — Emergency Department (HOSPITAL_BASED_OUTPATIENT_CLINIC_OR_DEPARTMENT_OTHER)
Admission: EM | Admit: 2023-12-25 | Discharge: 2023-12-25 | Disposition: A | Discharge status: Home / Self Care | Attending: Emergency Medicine | Admitting: Emergency Medicine

## 2023-12-25 ENCOUNTER — Encounter (HOSPITAL_BASED_OUTPATIENT_CLINIC_OR_DEPARTMENT_OTHER): Payer: Self-pay

## 2023-12-25 DIAGNOSIS — W268XXA Contact with other sharp object(s), not elsewhere classified, initial encounter: Secondary | ICD-10-CM | POA: Insufficient documentation

## 2023-12-25 DIAGNOSIS — S61216A Laceration without foreign body of right little finger without damage to nail, initial encounter: Secondary | ICD-10-CM | POA: Insufficient documentation

## 2023-12-25 DIAGNOSIS — S6991XA Unspecified injury of right wrist, hand and finger(s), initial encounter: Secondary | ICD-10-CM | POA: Diagnosis present

## 2023-12-25 MED ORDER — LIDOCAINE HCL 2 % IJ SOLN
5.0000 mL | Freq: Once | INTRAMUSCULAR | Status: AC
  Administered 2023-12-25: 100 mg
  Filled 2023-12-25: qty 20

## 2023-12-25 NOTE — ED Provider Notes (Signed)
 [facility] Provider Note   CSN: [id number] Arrival date & time: 12/25/23  1149     Patient presents with: Laceration   Brandy Medina is a 73 y.o. female.    Laceration Laceration to palmar aspect of right fifth finger.  Laceration over the distal phalanx.  Cut it on a broken plate.  No other injury.  Has had some bleeding.  Thinks her tetanus is up-to-date.    Prior to Admission medications   Medication Sig Start Date End Date Taking? Authorizing Provider  acetaminophen  (TYLENOL ) 500 MG tablet Take 500-1,000 mg by mouth every 6 (six) hours as needed for moderate pain or headache.    [provider]  amLODipine  (NORVASC ) 2.5 MG tablet Take 1 tablet (2.5 mg total) by mouth daily. 07/02/23 07/01/24  Jarold Medici, MD  butalbital -acetaminophen -caffeine  (FIORICET) 50-325-40 MG tablet Take 1 tablet by mouth every 6 (six) hours as needed for migraine. 07/30/23   Jarold Medici, MD  Cholecalciferol (VITAMIN D) 50 MCG (2000 UT) tablet Take 1,000 Units by mouth daily. Tues and Thurs    [provider]  clobetasol  (TEMOVATE ) 0.05 % external solution Apply 1 Application topically daily. Apply to scalp daily as needed for itch. 09/02/23   Alm Delon SAILOR, DO  Continuous Glucose Sensor (FREESTYLE LIBRE 3 PLUS SENSOR) MISC Change sensor every 15 days. 10/19/23   Motwani, Komal, MD  dextromethorphan (DELSYM) 30 MG/5ML liquid Take 30 mg by mouth 2 (two) times daily as needed for cough.    [provider]  epoetin  alfa (EPOGEN ) 10000 UNIT/ML injection as directed Injection once every 3 weeks for 3 months    [provider]  Glucagon  (BAQSIMI  ONE PACK) 3 MG/DOSE POWD Place 1 Device into the nose as needed (Low blood sugar with impaired consciousness). 09/15/23   Motwani, Komal, MD  glucose blood test strip Use as instructed 05/27/23   Jarold Medici, MD  Homeopathic Products (ARNICARE PAIN RELIEF EX) Apply 1 application.  topically daily as needed (pain).    [provider]  Respiratory Therapy Supplies (FLUTTER) DEVI Use as directed. 06/29/19   Tonna Redell SQUIBB, NP  rosuvastatin  (CRESTOR ) 5 MG tablet TAKE 1 TABLET(5 MG) BY MOUTH EVERY TUESDAY 11/16/23   Jarold Medici, MD  Safety Seal Miscellaneous MISC melaxemic cream - tranexamic acid 5% kojic acid 2% vit c 2.5% hyaluronic acid 0.1% - apply to affected areas BID morning and night. 08/19/23   Alm Delon SAILOR, DO  Safety Seal Miscellaneous MISC Apply 1 application  topically every morning. Medication Name: Hormonic Hair Apply to scalp daily in the morning. 09/02/23   Alm Delon SAILOR, DO  valsartan  (DIOVAN ) 320 MG tablet Take 1 tablet (320 mg total) by mouth daily. 09/30/23   Jarold Medici, MD  Wheat Dextrin (BENEFIBER PO) Take 1 Scoop by mouth daily.    [provider]    Allergies: Avelox [moxifloxacin hcl in nacl], Avelox [moxifloxacin], Azathioprine , Codeine, Erythromycin, Fexofenadine, Levocetirizine, Lisinopril, Sulfa antibiotics, Sulfonamide derivatives, and Zyrtec [cetirizine]    Review of Systems  Updated Vital Signs BP 128/84   Pulse 77   Temp 97.9 F (36.6 C) (Oral)   Resp 15   SpO2 99%   Physical Exam Vitals and nursing note reviewed.  Musculoskeletal:     Comments: Laceration across the palmar aspect of the fifth distal phalanx.  Approximately just a little over 1 cm.  Wound explored and no foreign body seen.  Good movement at DIP joint.  Sensation intact distally.  Neurological:     Mental Status: She is alert.     (all labs ordered are listed, but only abnormal results are displayed) Labs Reviewed - No data to display  EKG: None  Radiology: No results found.   .Laceration Repair  Date/Time: 12/25/2023 1:36 PM  Performed by: Patsey Lot, MD Authorized by: Patsey Lot, MD   Consent:    Consent obtained:  Verbal   Consent given by:  Patient   Risks discussed:  Infection, pain and retained foreign  body   Alternatives discussed:  No treatment and delayed treatment Universal protocol:    Patient identity confirmed:  Verbally with patient Anesthesia:    Anesthesia method:  Nerve block   Block location:  Digital   Block needle gauge:  25 G   Block anesthetic:  Lidocaine  2% w/o epi   Block technique:  Digital block   Block injection procedure:  Anatomic landmarks identified, introduced needle, incremental injection, anatomic landmarks palpated and negative aspiration for blood   Block outcome:  Anesthesia achieved Laceration details:    Location:  Finger   Finger location:  R small finger   Length (cm):  1.1 Pre-procedure details:    Preparation:  Patient was prepped and draped in usual sterile fashion Exploration:    Limited defect created (wound extended): no     Hemostasis achieved with:  Tourniquet   Imaging outcome: foreign body not noted     Wound exploration: wound explored through full range of motion   Treatment:    Wound cleansed with: alcohol.   Amount of cleaning:  Standard Skin repair:    Repair method:  Sutures   Suture size:  5-0   Suture material:  Prolene   Suture technique:  Simple interrupted   Number of sutures:  6 Approximation:    Approximation:  Close Repair type:    Repair type:  Simple    Medications Ordered in the ED  lidocaine  (XYLOCAINE ) 2 % (with pres) injection 100 mg (100 mg Infiltration Given 12/25/23 1318)                                    Medical Decision Making Risk Prescription drug management.   Patient finger laceration.  Tendon damage unlikely.  Clean wound.  Wound closed.  Tetanus up-to-date per patient.  Discharge home with outpatient follow-up for suture removal in about a week.     Final diagnoses:  Laceration of right little finger without foreign body without damage to nail, initial encounter    ED Discharge Orders     None          Patsey Lot, MD 12/25/23 1458

## 2023-12-25 NOTE — ED Notes (Signed)
Laceration cleaned and dressed

## 2023-12-25 NOTE — Discharge Instructions (Signed)
 The stitches can come out in about 7 days.

## 2023-12-25 NOTE — ED Notes (Signed)
 Discharge instructions reviewed with patient. Patient questions answered and opportunity for education reviewed. Patient voices understanding of discharge instructions with no further questions. Patient ambulatory with steady gait to lobby.

## 2023-12-25 NOTE — ED Triage Notes (Signed)
 Pt presents with lac to right pinky finger. Reports reaching plate from cabinet. Bleeding controlled. Denies other symptoms.

## 2023-12-29 ENCOUNTER — Encounter: Payer: Self-pay | Admitting: "Endocrinology

## 2023-12-31 ENCOUNTER — Ambulatory Visit: Payer: Self-pay | Admitting: Internal Medicine

## 2023-12-31 ENCOUNTER — Encounter: Payer: Self-pay | Admitting: Internal Medicine

## 2023-12-31 VITALS — BP 120/60 | HR 73 | Temp 98.4°F | Ht 64.0 in | Wt 167.4 lb

## 2023-12-31 DIAGNOSIS — S61216A Laceration without foreign body of right little finger without damage to nail, initial encounter: Secondary | ICD-10-CM

## 2023-12-31 NOTE — Progress Notes (Unsigned)
 I,Brandy Medina, CMA,acting as a neurosurgeon for Brandy LOISE Slocumb, MD.,have documented all relevant documentation on the behalf of Brandy LOISE Slocumb, MD,as directed by  Brandy LOISE Slocumb, MD while in the presence of Brandy LOISE Slocumb, MD.  Subjective:  Patient ID: Brandy Medina , female    DOB: [birth year] , 73 y.o.   MRN: [id number]  Chief Complaint  Patient presents with  . Hospitalization Follow-up    Patient presents today for hospital follow up. She visited [facility] ED on 11/28. For laceration of little finger. Today she discusses concerns to have stitches removed. No other concerns at this time.GYN exam due in June 2025 with [facility].  . Laceration    HPI  HPI   Past Medical History:  Diagnosis Date  . Allergic rhinitis   . Diabetes (HCC)   . HTN (hypertension)   . Migraines   . PONV (postoperative nausea and vomiting)   . Pulmonary fibrosis (HCC)      Family History  Problem Relation Age of Onset  . Renal Disease Brother   . Stroke Brother   . Bronchitis Mother   . Heart disease Mother   . Heart disease Father   . Heart attack Father   . Hypertension Sister   . Diabetes Sister   . Heart attack Brother   . Hypertension Sister   . Diabetes Sister      Current Outpatient Medications:  .  acetaminophen (TYLENOL) 500 MG tablet, Take 500-1,000 mg by mouth every 6 (six) hours as needed for moderate pain or headache., Disp: , Rfl:  .  amLODipine  (NORVASC ) 2.5 MG tablet, TAKE 1 TABLET(2.5 MG) BY MOUTH DAILY, Disp: 90 tablet, Rfl: 1 .  butalbital -acetaminophen-caffeine  (FIORICET) 50-325-40 MG tablet, Take 1 tablet by mouth every 6 (six) hours as needed for migraine., Disp: 14 tablet, Rfl: 0 .  Cholecalciferol (VITAMIN D) 50 MCG (2000 UT) tablet, Take 1,000 Units by mouth daily. Tues and Thurs, Disp: , Rfl:  .  clobetasol  (TEMOVATE ) 0.05 % external solution, Apply 1 Application topically daily. Apply to scalp daily as needed for itch., Disp: 50 mL, Rfl: 2 .   Continuous Glucose Sensor (FREESTYLE LIBRE 3 PLUS SENSOR) MISC, Change sensor every 15 days., Disp: 6 each, Rfl: 3 .  dextromethorphan (DELSYM) 30 MG/5ML liquid, Take 30 mg by mouth 2 (two) times daily as needed for cough., Disp: , Rfl:  .  epoetin  alfa (EPOGEN ) 10000 UNIT/ML injection, as directed Injection once every 3 weeks for 3 months, Disp: , Rfl:  .  Glucagon  (BAQSIMI  ONE PACK) 3 MG/DOSE POWD, Place 1 Device into the nose as needed (Low blood sugar with impaired consciousness)., Disp: 2 each, Rfl: 3 .  glucose blood test strip, Use as instructed, Disp: 100 each, Rfl: 12 .  Homeopathic Products (ARNICARE PAIN RELIEF EX), Apply 1 application. topically daily as needed (pain)., Disp: , Rfl:  .  rosuvastatin  (CRESTOR ) 5 MG tablet, TAKE 1 TABLET(5 MG) BY MOUTH EVERY TUESDAY, Disp: 12 tablet, Rfl: 3 .  Safety Seal Miscellaneous MISC, melaxemic cream - tranexamic acid 5% kojic acid 2% vit c 2.5% hyaluronic acid 0.1% - apply to affected areas BID morning and night., Disp: 15 g, Rfl: 4 .  Safety Seal Miscellaneous MISC, Apply 1 application  topically every morning. Medication Name: Hormonic Hair Apply to scalp daily in the morning., Disp: 30 mL, Rfl: 3 .  valsartan  (DIOVAN ) 320 MG tablet, Take 1 tablet (320 mg total) by mouth daily., Disp:  90 tablet, Rfl: 2 .  Wheat Dextrin (BENEFIBER PO), Take 1 Scoop by mouth daily., Disp: , Rfl:  .  Respiratory Therapy Supplies (FLUTTER) DEVI, Use as directed., Disp: 1 each, Rfl: 0   Allergies  Allergen Reactions  . Avelox [Moxifloxacin Hcl In Nacl] Other (See Comments)    confusion  . Avelox [Moxifloxacin] Other (See Comments)  . Azathioprine Other (See Comments)  . Codeine Nausea Only and Other (See Comments)  . Erythromycin     GI upset  . Fexofenadine Other (See Comments)  . Levocetirizine Other (See Comments)  . Lisinopril     Headache  . Sulfa Antibiotics Other (See Comments)  . Sulfonamide Derivatives     headache  . Zyrtec [Cetirizine] Other  (See Comments)     Review of Systems  Constitutional: Negative.   Respiratory: Negative.    Cardiovascular: Negative.   Gastrointestinal: Negative.   Neurological: Negative.   Psychiatric/Behavioral: Negative.       There were no vitals filed for this visit. There is no height or weight on file to calculate BMI.  Wt Readings from Last 3 Encounters:  12/18/23 165 lb (74.8 kg)  11/05/23 164 lb (74.4 kg)  11/03/23 164 lb 6.4 oz (74.6 kg)    The 10-year ASCVD risk score (Arnett DK, et al., 2019) is: 32.2%   Values used to calculate the score:     Age: 38 years     Clincally relevant sex: Female     Is Non-Hispanic African American: Yes     Diabetic: Yes     Tobacco smoker: No     Systolic Blood Pressure: 128 mmHg     Is BP treated: Yes     HDL Cholesterol: 93 mg/dL     Total Cholesterol: 186 mg/dL  Objective:  Physical Exam Vitals and nursing note reviewed.  Constitutional:      Appearance: Normal appearance.  HENT:     Head: Normocephalic and atraumatic.  Eyes:     Extraocular Movements: Extraocular movements intact.  Cardiovascular:     Rate and Rhythm: Normal rate and regular rhythm.     Heart sounds: Normal heart sounds.  Pulmonary:     Effort: Pulmonary effort is normal.     Breath sounds: Normal breath sounds.  Musculoskeletal:     Cervical back: Normal range of motion.  Skin:    General: Skin is warm.  Neurological:     General: No focal deficit present.     Mental Status: She is alert.  Psychiatric:        Mood and Affect: Mood normal.        Behavior: Behavior normal.         Assessment And Plan:   Assessment & Plan Laceration of right little finger without foreign body without damage to nail, initial encounter   No orders of the defined types were placed in this encounter.    No follow-ups on file.  Patient was given opportunity to ask questions. Patient verbalized understanding of the plan and was able to repeat key elements of the plan.  All questions were answered to their satisfaction.    I, Brandy LOISE Slocumb, MD, have reviewed all documentation for this visit. The documentation on 12/31/23 for the exam, diagnosis, procedures, and orders are all accurate and complete.  IF YOU HAVE BEEN REFERRED TO A SPECIALIST, IT MAY TAKE 1-2 WEEKS TO SCHEDULE/PROCESS THE REFERRAL. IF YOU HAVE NOT HEARD FROM US /SPECIALIST IN TWO WEEKS, PLEASE GIVE US  A  CALL AT [phone].

## 2023-12-31 NOTE — Patient Instructions (Signed)
Suture Removal, Care After The following information offers guidance on how to care for yourself after your procedure. Your health care provider may also give you more specific instructions. If you have problems or questions, contact your health care provider. What can I expect after the procedure? After your stitches (sutures) are removed, it is common to have: Some discomfort and swelling in the area. Slight redness in the area. Follow these instructions at home: If you have a dressing: Wash your hands with soap and water for at least 20 seconds before and after you change your bandage (dressing). If soap and water are not available, use hand sanitizer. Change your dressing as told by your health care provider. If your dressing becomes wet or dirty, or develops a bad smell, change it as soon as possible. If your dressing sticks to your skin, pour warm, clean water over it until it loosens and can be removed without pulling apart the wound edges. Pat the area dry with a soft, clean towel. Do not rub the wound because that may cause bleeding. Wound care  Check your wound every day for signs of infection. Check for: More redness, swelling, or pain. Fluid or blood. New warmth, a rash, or hardness at the wound site. Pus or a bad smell. Wash your hands with soap and water for at least 20 seconds before and after touching your wound. If soap and water are not available, use hand sanitizer. Keep the wound area dry and clean. Clean and pat the wound dry as told by your health care provider. Apply cream or ointment only as told by your health care provider. If skin glue or adhesive strips were applied after sutures were removed, leave these closures in place. They may need to stay in place for 2 weeks or longer. If adhesive strip edges start to loosen and curl up, you may trim the loose edges. Do not remove adhesive strips completely unless your health care provider tells you to do that. Continue to  protect the wound from injury. Do not pick at your wound. Picking can cause an infection. Bathing Do not take baths, swim, or use a hot tub until your health care provider approves. Ask your health care provider if you may take showers. Follow these steps for showering: If you have a dressing, remove it before getting into the shower. In the shower, allow soapy water to get on the wound. Avoid scrubbing the wound. When you get out of the shower, dry the wound by patting it with a clean towel. Reapply a dressing over the wound, if needed. Scar care When your wound has completely healed, help decrease the size of your scar by: Wearing sunscreen over the scar or covering it with clothing when you are outside. New scars get sunburned easily, which can make scarring worse. Gently massaging the scarred area. This can decrease scar thickness. General instructions Take over-the-counter and prescription medicines only as told by your health care provider. Keep all follow-up visits. This is important. Contact a health care provider if: You have more redness, swelling, or pain around your wound. You have fluid or blood coming from your wound. You have new warmth, a rash, or hardness at the wound site. You have pus or a bad smell coming from your wound. Your wound opens up. Get help right away if: You have a fever or chills. You have red streaks coming from your wound. Summary After your sutures are removed, it is common to have some discomfort  and swelling in the area. Wash your hands with soap and water before you change your bandage (dressing). Keep the wound area dry and clean. Do not take baths, swim, or use a hot tub until your health care provider approves. This information is not intended to replace advice given to you by your health care provider. Make sure you discuss any questions you have with your health care provider. Document Revised: 05/08/2020 Document Reviewed:  05/08/2020 Elsevier Patient Education  2024 [employer].

## 2024-01-06 ENCOUNTER — Other Ambulatory Visit: Payer: Self-pay | Admitting: Internal Medicine

## 2024-01-06 MED ORDER — BUTALBITAL-APAP-CAFFEINE 50-325-40 MG PO TABS: 1.0000 | ORAL_TABLET | Freq: Four times a day (QID) | ORAL | 0 refills | Status: AC | PRN

## 2024-01-14 LAB — OPHTHALMOLOGY REPORT-SCANNED

## 2024-02-08 ENCOUNTER — Ambulatory Visit: Admitting: Neurology

## 2024-02-19 ENCOUNTER — Encounter: Payer: Self-pay | Admitting: Pulmonary Disease

## 2024-02-19 NOTE — Telephone Encounter (Signed)
 I tried calling the pt to get further info on her symptoms but there was no answer No option to leave msg

## 2024-02-23 ENCOUNTER — Ambulatory Visit: Admitting: Dermatology

## 2024-02-23 ENCOUNTER — Encounter: Payer: Self-pay | Admitting: Pulmonary Disease

## 2024-02-24 ENCOUNTER — Encounter: Payer: Self-pay | Admitting: Pulmonary Disease

## 2024-02-24 ENCOUNTER — Ambulatory Visit: Payer: Self-pay | Admitting: Pulmonary Disease

## 2024-02-24 NOTE — Telephone Encounter (Signed)
 FYI Only or Action Required?: Action required by provider: update on patient condition and requesting meds.  Patient is followed in Pulmonology for pulm fibrosis, last seen on 11/05/2023 by Mannam, Praveen, MD.  Called Nurse Triage reporting Cough.  Symptoms began a week ago.  Interventions attempted: OTC medications: delsym, mucinex.  Symptoms are: gradually worsening.  Triage Disposition: See Physician Within 24 Hours  Patient/caregiver understands and will follow disposition?: No, wishes to speak with PCP   E2C2 Pulmonary Triage - Initial Assessment Questions Chief Complaint (e.g., cough, sob, wheezing, fever, chills, sweat or additional symptoms) *Go to specific symptom protocol after initial questions. Cough, concern for bronchitis  How long have symptoms been present? A week  Have you tested for COVID or Flu? Note: If not, ask patient if a home test can be taken. If so, instruct patient to call back for positive results. No  MEDICINES:   Have you used any OTC meds to help with symptoms? Yes If yes, ask What medications? Mucinex, Delsym  Have you used your inhalers/maintenance medication? No If yes, What medications? na  If inhaler, ask How many puffs and how often? Note: Review instructions on medication in the chart. na  OXYGEN: Do you wear supplemental oxygen? No If yes, How many liters are you supposed to use? na  Do you monitor your oxygen levels? Yes If yes, What is your reading (oxygen level) today? 96%  What is your usual oxygen saturation reading?  (Note: Pulmonary O2 sats should be 90% or greater) 96-99%   Message from Mina B sent at 02/24/2024 10:23 AM EST  Reason for Triage: Patient thinks she has bronchitis. She is coughing up thick green mucus. Warm transfer to NT   Reason for Disposition  SEVERE coughing spells (e.g., whooping sound after coughing, vomiting after coughing)  Answer Assessment - Initial Assessment  Questions Patient with productive cough and congestion for one week. Hx of Pulm Fibrosis and bronchiectasis. Phlegm started clear- yellow and now green-beige color. Coughing Barking and causing sore chest and back. Nosebleed Saturday  Denies Fever, CP, SOB, Dizziness, wheezing, ear or throat pain.   Patient requesting ABX and Cough medicine be sent into pharmacy for relief. She is not wanting to risk travel to the office and hard for Video visit. Pharmacy confirmed  1. ONSET: When did the cough begin?      A week  2. SEVERITY: How bad is the cough today?      Barking fits  3. SPUTUM: Describe the color of your sputum (e.g., none, dry cough; clear, white, yellow, green)     Light brown  4. HEMOPTYSIS: Are you coughing up any blood? If Yes, ask: How much? (e.g., flecks, streaks, tablespoons, etc.)     Denies -- had nosebleed Saturday  5. DIFFICULTY BREATHING: Are you having difficulty breathing? If Yes, ask: How bad is it? (e.g., mild, moderate, severe)      denies 6. FEVER: Do you have a fever? If Yes, ask: What is your temperature, how was it measured, and when did it start?     Denies  7. CARDIAC HISTORY: Do you have any history of heart disease? (e.g., heart attack, congestive heart failure)      Htn  8. LUNG HISTORY: Do you have any history of lung disease?  (e.g., pulmonary embolus, asthma, emphysema)     Pulm fibrosis, bronchiectasis  9. PE RISK FACTORS: Do you have a history of blood clots? (or: recent major surgery, recent prolonged travel, bedridden)  Denies  10. OTHER SYMPTOMS: Do you have any other symptoms? (e.g., runny nose, wheezing, chest pain)       Runny nose, sinus congestion  Protocols used: Cough - Acute Productive-A-AH

## 2024-02-25 ENCOUNTER — Other Ambulatory Visit

## 2024-02-25 MED ORDER — PREDNISONE 10 MG PO TABS: 40.0000 mg | ORAL_TABLET | Freq: Every day | ORAL | 0 refills | Status: AC

## 2024-02-25 MED ORDER — AZITHROMYCIN 250 MG PO TABS: 250.0000 mg | ORAL_TABLET | Freq: Every day | ORAL | 0 refills | Status: AC

## 2024-02-25 NOTE — Telephone Encounter (Signed)
 I called and discussed with patient.  Prescription for Z-Pak and prednisone  sent to pharmacy.  Nothing further needed.

## 2024-02-29 ENCOUNTER — Ambulatory Visit: Admitting: "Endocrinology

## 2024-03-02 LAB — INSULIN, RANDOM: Insulin: 13.8 u[IU]/mL

## 2024-03-02 LAB — BASIC METABOLIC PANEL WITH GFR
BUN/Creatinine Ratio: 20 (calc) (ref 6–22)
BUN: 20 mg/dL (ref 7–25)
CO2: 27 mmol/L (ref 20–32)
Calcium: 9.7 mg/dL (ref 8.6–10.4)
Chloride: 97 mmol/L — ABNORMAL LOW (ref 98–110)
Creat: 1.02 mg/dL — ABNORMAL HIGH (ref 0.60–1.00)
Glucose, Bld: 103 mg/dL — ABNORMAL HIGH (ref 65–99)
Potassium: 4.3 mmol/L (ref 3.5–5.3)
Sodium: 132 mmol/L — ABNORMAL LOW (ref 135–146)
eGFR: 58 mL/min/{1.73_m2} — ABNORMAL LOW

## 2024-03-02 LAB — INSULIN-LIKE GROWTH FACTOR
IGF-I, LC/MS: 86 ng/mL (ref 34–245)
Z-Score (Female): -0.4 {STDV}

## 2024-03-02 LAB — C-PEPTIDE: C-Peptide: 2.51 ng/mL (ref 0.80–3.85)

## 2024-03-02 LAB — BETA-HYDROXYBUTYRATE: Beta-Hydroxybutyric Acid: 0.21 mmol/L

## 2024-03-02 LAB — CORTISOL: Cortisol, Plasma: 15.2 ug/dL

## 2024-03-03 ENCOUNTER — Ambulatory Visit: Admitting: "Endocrinology

## 2024-03-03 ENCOUNTER — Encounter: Payer: Self-pay | Admitting: "Endocrinology

## 2024-03-03 VITALS — BP 100/80 | Ht 64.0 in | Wt 164.0 lb

## 2024-03-03 DIAGNOSIS — E162 Hypoglycemia, unspecified: Secondary | ICD-10-CM

## 2024-03-03 DIAGNOSIS — R7303 Prediabetes: Secondary | ICD-10-CM

## 2024-03-03 LAB — POCT GLYCOSYLATED HEMOGLOBIN (HGB A1C): Hemoglobin A1C: 6 % — AB (ref 4.0–5.6)

## 2024-03-03 MED ORDER — FREESTYLE LIBRE 3 PLUS SENSOR MISC: 3 refills | Status: AC

## 2024-03-03 MED ORDER — BLOOD GLUCOSE TEST VI STRP: 1.0000 | ORAL_STRIP | Freq: Three times a day (TID) | 5 refills | Status: AC

## 2024-03-03 NOTE — Progress Notes (Signed)
 "    Outpatient Endocrinology Note Brandy Birmingham, MD    Brandy Medina 06-16-1950 [id number]  Referring Provider: Jarold Medici, MD Primary Care Provider: Jarold Medici, MD Reason for consultation: Subjective   Assessment & Plan  Diagnoses and all orders for this visit:  Hypoglycemia -     POCT glycosylated hemoglobin (Hb A1C) -     Continuous Glucose Sensor (FREESTYLE LIBRE 3 PLUS SENSOR) MISC; Change sensor every 15 days.  Prediabetes   Hypoglycemia induced by carbs/heavy carb intake Patient has history of diabetes with A1c 6.5 x 2, not on any diabetes medication/supplement that can cause low, now pre-diabetic  Could not tolerate Jardiance  due to worsening low blood sugar Has blood glucose meter that she uses to check her blood sugar as needed, has CGM libre3  Recommend high-protein, low carb diet Recommend start one spoonful of cornstarch with cottage cheese in morning and night as needed to prevent low blood sugar  Continue nutritionist counseling Limit carbs to >60 gms/meal and <15 gms/snack 02/25/24: fasting labs for hypoglycemia panel are unremarkable (except BMP) Instructed pt to have high salt diet and follow up with PCP for mild hyponatremia: I notified PCP myself as well Previously,  Discussed carb counting, given handout as well as ordered diabetes education Discussed various protein sources to improve protein intake to avoid hypoglycemia Patient understands and agreed  Return in about 6 months (around 08/31/2024).   I have reviewed current medications, nurse's notes, allergies, vital signs, past medical and surgical history, family medical history, and social history for this encounter. Counseled patient on symptoms, examination findings, lab findings, imaging results, treatment decisions and monitoring and prognosis. The patient understood the recommendations and agrees with the treatment plan. All questions regarding treatment plan were fully answered.  Brandy Birmingham, MD  03/03/24   History of Present Illness HPI  Brandy Medina is a 74 y.o. year old female who presents for evaluation of hypoglycemia diagnosed in 60s managed by diet.  Checks BG fasting, sometimes checks within 2 hrs of break fast Doesn't like the alarms ob libre waking her up at night Has been eating some food and had prednisone  that raised sugars  Range: 49-257  Has glucagon   Stopped corn starch due to feeling full but was taking it more often  Initial history:  Saw nutritionist and has improved thereafter  History of reactive hypoglycemia in 1984 Has CGM  CGM interpretation: At today's visit, we reviewed her CGM downloads. The full report is scanned in the media. Reviewing the CGM trends, BG are low in evening and overnight and rarely low in between.  Initial history:  Recently she was put on jardiance  for renal protection that led to hypoglycemia of 61.   Uses blood glucose meter fasting+-at bedtime   Has a 56 blood sugar last week  A1C is in pre-diabetes range  Gets nervous stomach and sweating sometimes with lows, other times may not feel it  Has history of renal insufficiency  -The patient's medication and drug history was reviewed carefully for potential causes of hypoglycemia. -No history of insulin  usage or ingestion of an oral hypoglycemic agent -No history of alcoholism -No history of  hepatic cirrhosis/failure, -No history of other endocrine diseases -History of total gastrectomy + for stomach cancer -History of cardiac arrythmia  -No history of endocrine disorders (eg, pheochromocytoma, Addison disease, glucagon  deficiency, carcinomas, extrahepatic tumors) -No history of substance abuse (eg, cocaine, ethanol, salicylates, beta-blockers, pentamidine) -No history of nutritional disorders (eg, prolonged starvation  before anesthesia, protein calorie malnutrition, L-leucine-sensitive hypoglycemic defect in children, low-calorie ketogenic diet, renal  disease)   Physical Exam  BP 100/80   Ht 5' 4 (1.626 m)   Wt 164 lb (74.4 kg)   BMI 28.15 kg/m    Constitutional: well developed, well nourished Head: normocephalic, atraumatic Eyes: sclera anicteric, no redness Neck: supple Lungs: normal respiratory effort Neurology: alert and oriented Skin: dry, no appreciable rashes Musculoskeletal: no appreciable defects Psychiatric: normal mood and affect   Current Medications Patient's Medications  New Prescriptions   No medications on file  Previous Medications   ACETAMINOPHEN (TYLENOL) 500 MG TABLET    Take 500-1,000 mg by mouth every 6 (six) hours as needed for moderate pain or headache.   AMLODIPINE  (NORVASC ) 2.5 MG TABLET    TAKE 1 TABLET(2.5 MG) BY MOUTH DAILY   AZITHROMYCIN  (ZITHROMAX ) 250 MG TABLET    Take 1 tablet (250 mg total) by mouth daily. Take 2 tabs on day 1 and then 1 tab on day 2-4   BUTALBITAL -ACETAMINOPHEN-CAFFEINE  (FIORICET) 50-325-40 MG TABLET    Take 1 tablet by mouth every 6 (six) hours as needed for migraine.   CHOLECALCIFEROL (VITAMIN D) 50 MCG (2000 UT) TABLET    Take 1,000 Units by mouth daily. Tues and Thurs   CLOBETASOL  (TEMOVATE ) 0.05 % EXTERNAL SOLUTION    Apply 1 Application topically daily. Apply to scalp daily as needed for itch.   DEXTROMETHORPHAN (DELSYM) 30 MG/5ML LIQUID    Take 30 mg by mouth 2 (two) times daily as needed for cough.   EPOETIN  ALFA (EPOGEN ) 10000 UNIT/ML INJECTION    as directed Injection once every 3 weeks for 3 months   GLUCAGON  (BAQSIMI  ONE PACK) 3 MG/DOSE POWD    Place 1 Device into the nose as needed (Low blood sugar with impaired consciousness).   GLUCOSE BLOOD TEST STRIP    Use as instructed   HOMEOPATHIC PRODUCTS (ARNICARE PAIN RELIEF EX)    Apply 1 application. topically daily as needed (pain).   RESPIRATORY THERAPY SUPPLIES (FLUTTER) DEVI    Use as directed.   ROSUVASTATIN  (CRESTOR ) 5 MG TABLET    TAKE 1 TABLET(5 MG) BY MOUTH EVERY TUESDAY   SAFETY SEAL MISCELLANEOUS MISC     melaxemic cream - tranexamic acid 5% kojic acid 2% vit c 2.5% hyaluronic acid 0.1% - apply to affected areas BID morning and night.   SAFETY SEAL MISCELLANEOUS MISC    Apply 1 application  topically every morning. Medication Name: Hormonic Hair Apply to scalp daily in the morning.   VALSARTAN  (DIOVAN ) 320 MG TABLET    Take 1 tablet (320 mg total) by mouth daily.   WHEAT DEXTRIN (BENEFIBER PO)    Take 1 Scoop by mouth daily.  Modified Medications   Modified Medication Previous Medication   CONTINUOUS GLUCOSE SENSOR (FREESTYLE LIBRE 3 PLUS SENSOR) MISC Continuous Glucose Sensor (FREESTYLE LIBRE 3 PLUS SENSOR) MISC      Change sensor every 15 days.    Change sensor every 15 days.  Discontinued Medications   No medications on file    Allergies Allergies  Allergen Reactions   Avelox [Moxifloxacin Hcl In Nacl] Other (See Comments)    confusion   Avelox [Moxifloxacin] Other (See Comments)   Azathioprine Other (See Comments)   Codeine Nausea Only and Other (See Comments)   Erythromycin     GI upset   Fexofenadine Other (See Comments)   Levocetirizine Other (See Comments)   Lisinopril     Headache  Sulfa Antibiotics Other (See Comments)   Sulfonamide Derivatives     headache   Zyrtec [Cetirizine] Other (See Comments)    Past Medical History Past Medical History:  Diagnosis Date   Allergic rhinitis    Diabetes (HCC)    HTN (hypertension)    Migraines    PONV (postoperative nausea and vomiting)    Pulmonary fibrosis (HCC)     Past Surgical History Past Surgical History:  Procedure Laterality Date   ABDOMINAL HYSTERECTOMY  1989   partial   BIOPSY  06/01/2020   Procedure: BIOPSY;  Surgeon: Saintclair Jasper, MD;  Location: [facility];  Service: Gastroenterology;;  EGD and COLON   BIOPSY  06/04/2021   Procedure: BIOPSY;  Surgeon: Saintclair Jasper, MD;  Location: [facility];  Service: Gastroenterology;;   BREAST SURGERY     COLONOSCOPY WITH PROPOFOL  N/A 06/01/2020   Procedure:  COLONOSCOPY WITH PROPOFOL ;  Surgeon: Saintclair Jasper, MD;  Location: [facility];  Service: Gastroenterology;  Laterality: N/A;   ESOPHAGOGASTRODUODENOSCOPY N/A 06/04/2021   Procedure: ESOPHAGOGASTRODUODENOSCOPY (EGD);  Surgeon: Saintclair Jasper, MD;  Location: [facility];  Service: Gastroenterology;  Laterality: N/A;   ESOPHAGOGASTRODUODENOSCOPY (EGD) WITH PROPOFOL  N/A 06/01/2020   Procedure: ESOPHAGOGASTRODUODENOSCOPY (EGD) WITH PROPOFOL ;  Surgeon: Saintclair Jasper, MD;  Location: [facility];  Service: Gastroenterology;  Laterality: N/A;   POLYPECTOMY  06/01/2020   Procedure: POLYPECTOMY;  Surgeon: Saintclair Jasper, MD;  Location: [facility];  Service: Gastroenterology;;   SALPINGOOPHORECTOMY Left     Family History family history includes Bronchitis in her mother; Diabetes in her sister and sister; Heart attack in her brother and father; Heart disease in her father and mother; Hypertension in her sister and sister; Renal Disease in her brother; Stroke in her brother.  Social History Social History   Socioeconomic History   Marital status: Married    Spouse name: Not on file   Number of children: Not on file   Years of education: Not on file   Highest education level: Bachelor's degree (e.g., BA, AB, BS)  Occupational History   Not on file  Tobacco Use   Smoking status: Former    Current packs/day: 0.00    Average packs/day: 0.5 packs/day for 18.0 years (9.0 ttl pk-yrs)    Types: Cigarettes    Start date: 07/06/1975    Quit date: 07/05/1993    Years since quitting: 30.6   Smokeless tobacco: Never  Substance and Sexual Activity   Alcohol use: Yes    Comment: Occasional Wine or Martini   Drug use: Never   Sexual activity: Not Currently  Other Topics Concern   Not on file  Social History Narrative   Are you right handed or left handed? Right ended   Are you currently employed ? No   What is your current occupation? Retired   Do you live at home alone? No    Who lives with you?  Lives with  husband    What type of home do you live in: 1 story or 2 story? Lives in a two story home.        Social Drivers of Health   Tobacco Use: Medium Risk (03/03/2024)   Patient History    Smoking Tobacco Use: Former    Smokeless Tobacco Use: Never    Passive Exposure: Not on file  Financial Resource Strain: Low Risk (09/20/2023)   Overall Financial Resource Strain (CARDIA)    Difficulty of Paying Living Expenses: Not hard at all  Food Insecurity: No Food Insecurity (09/20/2023)  Epic    Worried About Programme Researcher, Broadcasting/film/video in the Last Year: Never true    [employer] of Food in the Last Year: Never true  Transportation Needs: No Transportation Needs (09/20/2023)   Epic    Lack of Transportation (Medical): No    Lack of Transportation (Non-Medical): No  Physical Activity: Unknown (09/20/2023)   Exercise Vital Sign    Days of Exercise per Week: Patient declined    Minutes of Exercise per Session: Not on file  Stress: No Stress Concern Present (09/20/2023)   [employer] of Occupational Health - Occupational Stress Questionnaire    Feeling of Stress: Not at all  Social Connections: Socially Integrated (09/20/2023)   Social Connection and Isolation Panel    Frequency of Communication with Friends and Family: More than three times a week    Frequency of Social Gatherings with Friends and Family: Patient declined    Attends Religious Services: More than 4 times per year    Active Member of Clubs or Organizations: Yes    Attends Banker Meetings: Patient declined    Marital Status: Married  Catering Manager Violence: Not At Risk (04/24/2023)   Humiliation, Afraid, Rape, and Kick questionnaire    Fear of Current or Ex-Partner: No    Emotionally Abused: No    Physically Abused: No    Sexually Abused: No  Depression (PHQ2-9): Low Risk (12/31/2023)   Depression (PHQ2-9)    PHQ-2 Score: 0  Alcohol Screen: Low Risk (09/20/2023)   Alcohol Screen    Last Alcohol Screening Score (AUDIT):  1  Housing: Low Risk (09/20/2023)   Epic    Unable to Pay for Housing in the Last Year: No    Number of Times Moved in the Last Year: 0    Homeless in the Last Year: No  Utilities: Not At Risk (04/24/2023)   [facility] Utilities    Threatened with loss of utilities: No  Health Literacy: Adequate Health Literacy (04/24/2023)   B1300 Health Literacy    Frequency of need for help with medical instructions: Never    Lab Results  Component Value Date   CHOL 186 09/21/2023   Lab Results  Component Value Date   HDL 93 09/21/2023   Lab Results  Component Value Date   LDLCALC 81 09/21/2023   Lab Results  Component Value Date   TRIG 61 09/21/2023   Lab Results  Component Value Date   CHOLHDL 2.0 09/21/2023   Lab Results  Component Value Date   CREATININE 1.02 (H) 02/25/2024   Lab Results  Component Value Date   GFR 52.93 (L) 05/14/2020      Component Value Date/Time   NA 132 (L) 02/25/2024 0808   NA 134 02/03/2023 1537   NA 133 (L) 11/20/2015 1608   K 4.3 02/25/2024 0808   K 4.2 11/20/2015 1608   CL 97 (L) 02/25/2024 0808   CO2 27 02/25/2024 0808   CO2 24 11/20/2015 1608   GLUCOSE 103 (H) 02/25/2024 0808   GLUCOSE 90 11/20/2015 1608   BUN 20 02/25/2024 0808   BUN 17 02/03/2023 1537   BUN 25.3 11/20/2015 1608   CREATININE 1.02 (H) 02/25/2024 0808   CREATININE 1.0 11/20/2015 1608   CALCIUM  9.7 02/25/2024 0808   CALCIUM  10.5 (H) 11/20/2015 1608   PROT 8.9 (H) 08/10/2023 0817   PROT 9.1 (H) 05/05/2023 1620   PROT 10.3 (H) 11/20/2015 1608   ALBUMIN 3.8 08/10/2023 0817   ALBUMIN 4.1 02/03/2023 1537  ALBUMIN 3.6 11/20/2015 1608   AST 22 08/10/2023 0817   AST 19 11/20/2015 1608   ALT 15 08/10/2023 0817   ALT 13 11/20/2015 1608   ALKPHOS 67 08/10/2023 0817   ALKPHOS 98 11/20/2015 1608   BILITOT 0.4 08/10/2023 0817   BILITOT 0.33 11/20/2015 1608   GFRNONAA >60 08/10/2023 0817      Latest Ref Rng & Units 02/25/2024    8:08 AM 08/10/2023    8:17 AM 04/30/2023    6:15 PM   BMP  Glucose 65 - 99 mg/dL 896  91  91   BUN 7 - 25 mg/dL 20  23  30    Creatinine 0.60 - 1.00 mg/dL 8.97  9.09  8.97   BUN/Creat Ratio 6 - 22 (calc) 20     Sodium 135 - 146 mmol/L 132  135  132   Potassium 3.5 - 5.3 mmol/L 4.3  4.1  5.1   Chloride 98 - 110 mmol/L 97  103  100   CO2 20 - 32 mmol/L 27  28  25    Calcium  8.6 - 10.4 mg/dL 9.7  9.7  9.0        Component Value Date/Time   WBC 3.5 (L) 08/10/2023 0817   WBC 4.4 04/30/2023 1815   RBC 3.18 (L) 08/10/2023 0817   HGB 10.0 (L) 08/10/2023 0817   HGB 9.8 (L) 02/05/2022 1633   HGB 9.5 (L) 11/20/2015 1608   HCT 29.9 (L) 08/10/2023 0817   HCT 29.5 (L) 02/05/2022 1633   HCT 27.7 (L) 11/20/2015 1608   PLT 211 08/10/2023 0817   PLT 150 02/05/2022 1633   MCV 94.0 08/10/2023 0817   MCV 93 02/05/2022 1633   MCV 92.3 11/20/2015 1608   MCH 31.4 08/10/2023 0817   MCHC 33.4 08/10/2023 0817   RDW 14.4 08/10/2023 0817   RDW 15.1 02/05/2022 1633   RDW 14.3 11/20/2015 1608   LYMPHSABS 1.0 08/10/2023 0817   LYMPHSABS 0.9 02/05/2022 1633   LYMPHSABS 1.0 11/20/2015 1608   MONOABS 0.6 08/10/2023 0817   MONOABS 0.4 11/20/2015 1608   EOSABS 0.1 08/10/2023 0817   EOSABS 0.0 02/05/2022 1633   BASOSABS 0.0 08/10/2023 0817   BASOSABS 0.0 02/05/2022 1633   BASOSABS 0.0 11/20/2015 1608   Lab Results  Component Value Date   TSH 1.880 02/03/2023   TSH 1.95 03/27/2008         Parts of this note may have been dictated using voice recognition software. There may be variances in spelling and vocabulary which are unintentional. Not all errors are proofread. Please notify the dino if any discrepancies are noted or if the meaning of any statement is not clear.   "

## 2024-03-03 NOTE — Addendum Note (Signed)
 Addended by: Rebeccah Ivins on: 03/03/2024 10:10 AM   Modules accepted: Orders

## 2024-03-07 ENCOUNTER — Ambulatory Visit

## 2024-03-21 ENCOUNTER — Ambulatory Visit: Payer: Self-pay | Admitting: Internal Medicine

## 2024-05-09 ENCOUNTER — Encounter

## 2024-05-10 ENCOUNTER — Other Ambulatory Visit

## 2024-05-11 ENCOUNTER — Ambulatory Visit: Admitting: Neurology

## 2024-05-25 ENCOUNTER — Ambulatory Visit: Admitting: Pulmonary Disease

## 2024-08-29 ENCOUNTER — Ambulatory Visit: Admitting: "Endocrinology

## 2024-09-26 ENCOUNTER — Encounter: Payer: Self-pay | Admitting: Internal Medicine
